# Patient Record
Sex: Female | Born: 1975 | Race: White | Hispanic: No | Marital: Single | Smoking: Current every day smoker
Health system: Southern US, Community
[De-identification: ages and names within clinical notes are randomized; demographics above are authoritative.]

## PROBLEM LIST (undated history)

## (undated) ENCOUNTER — Inpatient Hospital Stay (HOSPITAL_COMMUNITY): Payer: Self-pay

## (undated) ENCOUNTER — Emergency Department (HOSPITAL_COMMUNITY): Admission: EM | Payer: Medicaid Other | Source: Home / Self Care

## (undated) DIAGNOSIS — G8929 Other chronic pain: Secondary | ICD-10-CM

## (undated) DIAGNOSIS — F1010 Alcohol abuse, uncomplicated: Secondary | ICD-10-CM

## (undated) DIAGNOSIS — J45909 Unspecified asthma, uncomplicated: Secondary | ICD-10-CM

## (undated) DIAGNOSIS — K219 Gastro-esophageal reflux disease without esophagitis: Secondary | ICD-10-CM

## (undated) DIAGNOSIS — K089 Disorder of teeth and supporting structures, unspecified: Secondary | ICD-10-CM

## (undated) DIAGNOSIS — F32A Depression, unspecified: Secondary | ICD-10-CM

## (undated) DIAGNOSIS — N760 Acute vaginitis: Principal | ICD-10-CM

## (undated) DIAGNOSIS — R102 Pelvic and perineal pain unspecified side: Secondary | ICD-10-CM

## (undated) DIAGNOSIS — F410 Panic disorder [episodic paroxysmal anxiety] without agoraphobia: Secondary | ICD-10-CM

## (undated) DIAGNOSIS — A5901 Trichomonal vulvovaginitis: Secondary | ICD-10-CM

## (undated) DIAGNOSIS — F1910 Other psychoactive substance abuse, uncomplicated: Secondary | ICD-10-CM

## (undated) DIAGNOSIS — F329 Major depressive disorder, single episode, unspecified: Secondary | ICD-10-CM

## (undated) DIAGNOSIS — B9689 Other specified bacterial agents as the cause of diseases classified elsewhere: Secondary | ICD-10-CM

## (undated) HISTORY — DX: Major depressive disorder, single episode, unspecified: F32.9

## (undated) HISTORY — DX: Unspecified asthma, uncomplicated: J45.909

## (undated) HISTORY — DX: Other specified bacterial agents as the cause of diseases classified elsewhere: B96.89

## (undated) HISTORY — DX: Trichomonal vulvovaginitis: A59.01

## (undated) HISTORY — DX: Depression, unspecified: F32.A

## (undated) HISTORY — DX: Gastro-esophageal reflux disease without esophagitis: K21.9

## (undated) HISTORY — DX: Acute vaginitis: N76.0

## (undated) HISTORY — PX: ECTOPIC PREGNANCY SURGERY: SHX613

---

## 1997-12-09 ENCOUNTER — Other Ambulatory Visit: Admission: RE | Admit: 1997-12-09 | Discharge: 1997-12-09 | Payer: Self-pay | Admitting: Family Medicine

## 1998-02-04 ENCOUNTER — Other Ambulatory Visit: Admission: RE | Admit: 1998-02-04 | Discharge: 1998-02-04 | Payer: Self-pay | Admitting: Family Medicine

## 1999-01-12 ENCOUNTER — Encounter: Payer: Self-pay | Admitting: Obstetrics & Gynecology

## 1999-01-12 ENCOUNTER — Inpatient Hospital Stay (HOSPITAL_COMMUNITY): Admission: AD | Admit: 1999-01-12 | Discharge: 1999-01-12 | Payer: Self-pay | Admitting: Obstetrics & Gynecology

## 2000-12-05 ENCOUNTER — Inpatient Hospital Stay (HOSPITAL_COMMUNITY): Admission: AD | Admit: 2000-12-05 | Discharge: 2000-12-05 | Payer: Self-pay | Admitting: Obstetrics

## 2000-12-06 ENCOUNTER — Inpatient Hospital Stay (HOSPITAL_COMMUNITY): Admission: RE | Admit: 2000-12-06 | Discharge: 2000-12-06 | Payer: Self-pay | Admitting: Obstetrics

## 2000-12-06 ENCOUNTER — Encounter: Payer: Self-pay | Admitting: Obstetrics

## 2000-12-08 ENCOUNTER — Inpatient Hospital Stay (HOSPITAL_COMMUNITY): Admission: AD | Admit: 2000-12-08 | Discharge: 2000-12-08 | Payer: Self-pay | Admitting: Obstetrics & Gynecology

## 2005-02-02 ENCOUNTER — Emergency Department (HOSPITAL_COMMUNITY): Admission: EM | Admit: 2005-02-02 | Discharge: 2005-02-02 | Payer: Self-pay | Admitting: *Deleted

## 2005-10-20 ENCOUNTER — Ambulatory Visit (HOSPITAL_COMMUNITY): Admission: AD | Admit: 2005-10-20 | Discharge: 2005-10-20 | Payer: Self-pay | Admitting: Obstetrics and Gynecology

## 2005-11-07 ENCOUNTER — Inpatient Hospital Stay (HOSPITAL_COMMUNITY): Admission: AD | Admit: 2005-11-07 | Discharge: 2005-11-09 | Payer: Self-pay | Admitting: Obstetrics and Gynecology

## 2007-08-25 ENCOUNTER — Emergency Department (HOSPITAL_COMMUNITY): Admission: EM | Admit: 2007-08-25 | Discharge: 2007-08-25 | Payer: Self-pay | Admitting: Emergency Medicine

## 2008-01-06 ENCOUNTER — Emergency Department (HOSPITAL_COMMUNITY): Admission: EM | Admit: 2008-01-06 | Discharge: 2008-01-06 | Payer: Self-pay | Admitting: Emergency Medicine

## 2008-01-07 ENCOUNTER — Inpatient Hospital Stay (HOSPITAL_COMMUNITY): Admission: AD | Admit: 2008-01-07 | Discharge: 2008-01-08 | Payer: Self-pay | Admitting: Obstetrics and Gynecology

## 2008-01-07 ENCOUNTER — Encounter: Payer: Self-pay | Admitting: Emergency Medicine

## 2008-01-09 ENCOUNTER — Inpatient Hospital Stay (HOSPITAL_COMMUNITY): Admission: AD | Admit: 2008-01-09 | Discharge: 2008-01-09 | Payer: Self-pay | Admitting: Gynecology

## 2008-01-11 ENCOUNTER — Encounter (INDEPENDENT_AMBULATORY_CARE_PROVIDER_SITE_OTHER): Payer: Self-pay | Admitting: Gynecology

## 2008-01-11 ENCOUNTER — Ambulatory Visit (HOSPITAL_COMMUNITY): Admission: AD | Admit: 2008-01-11 | Discharge: 2008-01-11 | Payer: Self-pay | Admitting: Gynecology

## 2008-01-11 ENCOUNTER — Ambulatory Visit: Payer: Self-pay | Admitting: Gynecology

## 2008-03-12 ENCOUNTER — Emergency Department (HOSPITAL_COMMUNITY): Admission: EM | Admit: 2008-03-12 | Discharge: 2008-03-12 | Payer: Self-pay | Admitting: Emergency Medicine

## 2008-09-16 ENCOUNTER — Emergency Department (HOSPITAL_COMMUNITY): Admission: EM | Admit: 2008-09-16 | Discharge: 2008-09-16 | Payer: Self-pay | Admitting: Emergency Medicine

## 2009-05-06 ENCOUNTER — Encounter: Payer: Self-pay | Admitting: Orthopedic Surgery

## 2009-05-06 ENCOUNTER — Emergency Department (HOSPITAL_COMMUNITY): Admission: EM | Admit: 2009-05-06 | Discharge: 2009-05-07 | Payer: Self-pay | Admitting: Emergency Medicine

## 2009-05-27 ENCOUNTER — Encounter: Payer: Self-pay | Admitting: Orthopedic Surgery

## 2009-05-28 ENCOUNTER — Ambulatory Visit: Payer: Self-pay | Admitting: Orthopedic Surgery

## 2009-05-28 DIAGNOSIS — S93409A Sprain of unspecified ligament of unspecified ankle, initial encounter: Secondary | ICD-10-CM | POA: Insufficient documentation

## 2009-06-02 ENCOUNTER — Encounter: Payer: Self-pay | Admitting: Orthopedic Surgery

## 2009-06-02 ENCOUNTER — Emergency Department (HOSPITAL_COMMUNITY): Admission: EM | Admit: 2009-06-02 | Discharge: 2009-06-02 | Payer: Self-pay | Admitting: Emergency Medicine

## 2009-06-03 ENCOUNTER — Encounter (HOSPITAL_COMMUNITY): Admission: RE | Admit: 2009-06-03 | Discharge: 2009-07-03 | Payer: Self-pay | Admitting: Orthopedic Surgery

## 2009-06-05 ENCOUNTER — Ambulatory Visit: Payer: Self-pay | Admitting: Orthopedic Surgery

## 2009-06-05 DIAGNOSIS — S62639A Displaced fracture of distal phalanx of unspecified finger, initial encounter for closed fracture: Secondary | ICD-10-CM | POA: Insufficient documentation

## 2009-06-09 ENCOUNTER — Emergency Department (HOSPITAL_COMMUNITY): Admission: EM | Admit: 2009-06-09 | Discharge: 2009-06-09 | Payer: Self-pay | Admitting: Psychology

## 2009-06-30 ENCOUNTER — Encounter: Payer: Self-pay | Admitting: Orthopedic Surgery

## 2009-07-08 ENCOUNTER — Encounter: Payer: Self-pay | Admitting: Orthopedic Surgery

## 2009-07-21 ENCOUNTER — Encounter: Payer: Self-pay | Admitting: Orthopedic Surgery

## 2009-07-22 ENCOUNTER — Emergency Department (HOSPITAL_COMMUNITY): Admission: EM | Admit: 2009-07-22 | Discharge: 2009-07-22 | Payer: Self-pay | Admitting: Emergency Medicine

## 2009-11-19 ENCOUNTER — Emergency Department (HOSPITAL_COMMUNITY): Admission: EM | Admit: 2009-11-19 | Discharge: 2009-11-19 | Payer: Self-pay | Admitting: Emergency Medicine

## 2010-02-16 ENCOUNTER — Emergency Department (HOSPITAL_COMMUNITY): Admission: EM | Admit: 2010-02-16 | Discharge: 2010-02-16 | Payer: Self-pay | Admitting: Emergency Medicine

## 2010-09-07 ENCOUNTER — Emergency Department (HOSPITAL_COMMUNITY)
Admission: EM | Admit: 2010-09-07 | Discharge: 2010-09-07 | Payer: Self-pay | Source: Home / Self Care | Admitting: Emergency Medicine

## 2010-09-08 NOTE — Assessment & Plan Note (Signed)
Summary: [facility] FOL UP/FX RT THUMB/NEW PROB/XR [facility] 06/02/09/[place] MEDICAID.Marland KitchenMarland Kitchen   Visit Type:  new Referring Provider:  emergency room  CC:  pain RIGHT thumb.  History of Present Illness: I saw Susan [name] in the office today for a followup visit.  She is a 35 years old woman with the complaint of:  NEW PROBLEM. RIGHT THUMB.  ER ON 06-02-09.  chief complaint: RIGHT THUMB.  ER gave her Hydrocodone and Ibuprofen and she has taken all of that medication.   pain -duration   06-02-09. -location  RIGHT thumb -severity [1-10]   8 -timing, constant -worsened by:  Movement. -improved by:   Nothing. -quality: aching -context: Injury, hit the door. -other symptoms, swelling -xrays done & where: [facility] on 06-02-09. Transverse minimally displaced fracture through the distal  phalanx.  Page Allergies: No Known Drug Allergies  Physical Exam  Additional Exam:  normal the ligament and hygiene normal pulse and fusion in the thumb.  Normal sensation thumb.  Patient awake alert and oriented x3.  Lymph nodes not examined  Skin warm dry and intact  Gait and posture not assessed  Inspection shows tenderness and swelling of the thumb but no malalignment, interphalangeal joint range of motion is reduced because of pain and immobilization, most of his normal, IP joint is stable.   Impression & Recommendations: The x-rays were done at [facility]. The report and the films have been reviewed.  nd d phalanx fracture   Medications Added to Medication List This Visit: 1)  Vicodin 5-500 Mg Tabs (Hydrocodone-acetaminophen) .Marland Kitchen.. 1 q 4 as needed pain 2)  Ibuprofen 800 Mg Tabs (Ibuprofen) .Marland Kitchen.. 1 q 8 hrs  Other Orders: New Patient Level II (84696) Distal Phalanx Fx (29528)  Patient Instructions: 1)  Mon. Nov.,  15th xray of the right thumb  Prescriptions: IBUPROFEN 800 MG TABS (IBUPROFEN) 1 q 8 hrs  #90 x 1   Entered and Authorized by:   Fuller Canada MD   Signed by:   Fuller Canada MD on  06/05/2009   Method used:   Print then Give to Patient   RxID:   [id number] VICODIN 5-500 MG TABS (HYDROCODONE-ACETAMINOPHEN) 1 q 4 as needed pain  #54 x 1   Entered and Authorized by:   Fuller Canada MD   Signed by:   Fuller Canada MD on 06/05/2009   Method used:   Print then Give to Patient   RxID:   [id number]

## 2010-09-08 NOTE — Letter (Signed)
Summary: *Orthopedic No Show Letter  [name]  [name]  [place], [place] [address]   Phone: [phone]  Fax: [phone]    07/08/2009    Ms. Susan [name] [address] [place], [place] [address]    Dear [name],   Our records indicate that you missed your scheduled appointment with Dr. Beaulah Corin on 06/23/09.  Please contact this office to reschedule your appointment.  It is important that you keep your scheduled appointments with your physician, so we can provide you the best care possible.  We have enclosed an appointment card for your convenience.      Sincerely,    Dr. Terrance Mass, MD [facility] Phone [phone]

## 2010-09-08 NOTE — Miscellaneous (Signed)
Summary: Rehab Discharge  Rehab Discharge   Imported By: Jacklynn Ganong 07/08/2009 09:23:10  _____________________________________________________________________  External Attachment:    Type:   Image     Comment:   External Document

## 2010-11-10 LAB — D-DIMER, QUANTITATIVE (NOT AT ARMC): D-Dimer, Quant: 0.22 ug/mL-FEU (ref 0.00–0.48)

## 2010-11-10 LAB — BASIC METABOLIC PANEL
BUN: 7 mg/dL (ref 6–23)
CO2: 26 mEq/L (ref 19–32)
Calcium: 8.8 mg/dL (ref 8.4–10.5)
Chloride: 102 mEq/L (ref 96–112)
Creatinine, Ser: 0.79 mg/dL (ref 0.4–1.2)
GFR calc Af Amer: >60 mL/min (ref >60)
GFR calc non Af Amer: >60 mL/min (ref >60)
Glucose, Bld: 121 mg/dL — ABNORMAL HIGH (ref 70–99)
Potassium: 3.7 mEq/L (ref 3.5–5.1)
Sodium: 136 mEq/L (ref 135–145)

## 2010-11-11 LAB — URINALYSIS, ROUTINE W REFLEX MICROSCOPIC
Bilirubin Urine: NEGATIVE
Glucose, UA: NEGATIVE mg/dL
Ketones, ur: NEGATIVE mg/dL
Leukocytes, UA: NEGATIVE
Nitrite: NEGATIVE
Protein, ur: NEGATIVE mg/dL
Specific Gravity, Urine: >1.03 — ABNORMAL HIGH (ref 1.005–1.030)
Urobilinogen, UA: 0.2 mg/dL (ref 0.0–1.0)
pH: 5.5 (ref 5.0–8.0)

## 2010-11-11 LAB — URINE MICROSCOPIC-ADD ON

## 2010-11-11 LAB — GC/CHLAMYDIA PROBE AMP, GENITAL
Chlamydia, DNA Probe: NEGATIVE
GC Probe Amp, Genital: NEGATIVE

## 2010-11-11 LAB — PREGNANCY, URINE: Preg Test, Ur: NEGATIVE

## 2010-11-24 LAB — PREGNANCY, URINE: Preg Test, Ur: NEGATIVE

## 2010-12-22 NOTE — Op Note (Signed)
NAMEDEVYNN, HESSLER NO.:  [id number]   MEDICAL RECORD NO.:  [id number]          PATIENT TYPE:  AMB   LOCATION:  MATC                          FACILITY:  [facility]   PHYSICIAN:  Ginger Carne, MD  DATE OF BIRTH:  1976-02-08   DATE OF PROCEDURE:  01/11/2008  DATE OF DISCHARGE:                               OPERATIVE REPORT   PREOPERATIVE DIAGNOSIS:  Right tubal pregnancy.   POSTOPERATIVE DIAGNOSIS:  Right tubal pregnancy.   PROCEDURE:  Laparoscopic right salpingectomy and dilation and curettage.   SURGEON:  Ginger Carne, MD   ASSISTANT:  None.   COMPLICATIONS:  None immediate.   ESTIMATED BLOOD LOSS:  300 mL of old and clotted blood in the pelvis and  right gutter.   SPECIMEN:  Right tube to pathology.   ANESTHESIA:  General.   OPERATIVE FINDINGS:  External genitalia, vulva, and vagina normal.  Cervix smooth without erosions or lesions.  Laparoscopic evaluation  revealed about 200-300 mL of old and fresh clotted blood in the pelvis,  cul-de-sac, and right gutter extending up to the liver.  The right tube  was intact and significantly swollen consistent with a tubal pregnancy  and with the appearance of extrusion and active bleeding through the  fimbriated end consistent with a tubal pregnancy expulsion. Tissue in  the cul-de-sac appeared consistent with a tubal pregnancy that was  grayish and had a different consistency from old clotted blood.  The  right tube in addition to the extruded material in the cul-de-sac was  sent to pathology.  The uterus was normal in contour and size.  Left  tube and ovary appeared normal as did the right ovary.  The patient is  Rh positive.   OPERATIVE PROCEDURE:  The patient prepped and draped in usual fashion  and placed in the lithotomy position.  Betadine solution used for  antiseptic and the patient was catheterized prior to procedure.  After  adequate general anesthesia, tenaculum placed in the anterior lip of  the  cervix, dilatation to accommodate sharp curettage followed.  Specimen to  pathology.  The uterine cavity sounded to 7 cm.  Afterwards, Hickman  tenaculum placed in the endocervical canal.  A vertical infraumbilical  incision was made and a Veress needle placed in the abdomen.  Opening  closing pressures were 10-15 mmHg.  Needle released, trocar placed in  same incision.  Laparoscope placed in trocar sleeve.  Two 5-mm ports  were made in the left lower quadrant, left hypogastric regions under  direct visualization.  After identifying the right tubal pregnancy, the  right mesosalpinx was bipolar cauterized and cut to the isthmus at the  lateral junction of the uterus.  At this point, the mesosalpinx was cut  in addition to the cauterized portion of the tube adjacent to the right  lateral uterine wall.  Specimen removed with an Endopouch bag including  previous aforementioned cul-de-sac contents.  Irrigation followed with  irrigant removed.  No active bleeding noted  in the right mesosalpinx area.  Afterwards gas released, trocars  removed, closure of the 10-mm  fascia site with 0 Vicryl suture and 4-0  Vicryl for subcuticular closure.  The patient tolerated the procedure  well, returned to post anesthesia recovery room in excellent condition.      Ginger Carne, MD  Electronically Signed     SHB/MEDQ  D:  01/11/2008  T:  01/12/2008  Job:  [id number]

## 2010-12-25 NOTE — Op Note (Signed)
NAMEJALINE, PINCOCK NO.:  [id number]   MEDICAL RECORD NO.:  [id number]          PATIENT TYPE:  INP   LOCATION:  A401                          FACILITY:  [facility]   PHYSICIAN:  Langley Gauss, MD     DATE OF BIRTH:  [birth year]   DATE OF PROCEDURE:  11/07/2005  DATE OF DISCHARGE:                                 OPERATIVE REPORT   DELIVERY NOTE:   DELIVERY PERFORMED:  1.  Well controlled spontaneous assisted vaginal delivery of a viable      vigorous female infant.  2.  Repair of first-degree perineal laceration.   Delivery is complicated by findings of a nuchal cord x2 with compression  during this short second stage of labor, resulting in variable decelerations  down to 80-90 beats per minute.  Also noted at time of amniotomy is light  meconium-stained amniotic fluid.   SPECIMEN:  Arterial cord gas and cord blood sent to laboratory.  Placenta is  examined and appears to be intact with a three-vessel umbilical cord.   SUMMARY:  Patient admitted to having received one prenatal visit at [facility].  Initial examination 6 cm dilated.  Subsequently admitted and then  started on IV ampicillin due to unknown group B strep status.  With  amniotomy noted is very thin light meconium-stained amniotic fluid.  The  patient is noted be 8 cm dilated at that time.  Subsequently she progressed  very rapidly to complete dilatation.  She is placed a low lithotomy  position, prepped and draped in the usual sterile manner.  She pushed well  during a short second stage of labor to deliver in a direct OA position.  Mouth and nares were immediately DeLee suctioned connected to wall suction  on the perineum.  At that point in time the taut nuchal cord x2 is noted.  There was not enough laxity to allow it to be reduced over the infant's  vertex.  Thus the cord is doubly clamped and cut and nuchal cord x1 is then  transected.  Spontaneous rotation occurred to a right anterior  shoulder  position.  Gentle downward traction combined with expulsive efforts resulted  in delivery of this anterior shoulder as well as the remaining infant  without difficulty.  The infant is noted be vigorous with a spontaneous cry.  Thus the vocal cords are not visualized.  Arterial cord gas and cord blood  are then obtained from the umbilical cord.  Gentle traction on the umbilical  cord results in separation, which upon examination appears to be an intact  placenta with associated three-vessel umbilical cord.  Examination of the  genital tract reveals no lacerations.  There is a first-degree perineal  laceration, which is then sterilely prepped.  A total of 10  mL 1% lidocaine plain is placed.  A single figure-of-eight suture of 0  chromic is placed to reapproximate the perineal body.  The patient tolerated  the delivery very well.  She is allowed to bond with the infant subsequently  immediately following delivery.      Langley Gauss, MD  Electronically Signed     DC/MEDQ  D:  11/07/2005  T:  11/08/2005  Job:  [id number]

## 2010-12-25 NOTE — Discharge Summary (Signed)
NAMEJULIETTE, STANDRE NO.:  [id number]   MEDICAL RECORD NO.:  [id number]          PATIENT TYPE:  OIB   LOCATION:  LDR2                          FACILITY:  [facility]   PHYSICIAN:  Tilda Burrow, M.D. DATE OF BIRTH:  October 02, 1975   DATE OF ADMISSION:  10/20/2005  DATE OF DISCHARGE:  03/14/2007LH                                 DISCHARGE SUMMARY   OBSERVATION COURSE:  Aily is 35 years old and about [redacted] weeks pregnant.  She came in with complaints of contractions.  She has only had one prenatal  visit.  She was not having any contractions.  Her complaints of pain  subsided.  She was monitored for about 1-1/2 hours.  She was offered  to  stay and be observed for several hours, however, she declined saying that  she had family issues she had to attend to.  She supposedly had an  appointment at our office this morning at 9 a.m.  The patient was allowed to  go home.  Cervical exam was 2 cm, 50% effaced, -2 station which is the same  exam that she has had a previous visit to the hospital a few days prior.      Jacklyn Shell, C.N.M.      Tilda Burrow, M.D.  Electronically Signed    FC/MEDQ  D:  10/21/2005  T:  10/22/2005  Job:  [id number]   cc:   [facility]

## 2010-12-25 NOTE — H&P (Signed)
Susan, [name] NO.:  [id number]   MEDICAL RECORD NO.:  [id number]          PATIENT TYPE:  INP   LOCATION:  A401                          FACILITY:  [facility]   PHYSICIAN:  Langley Gauss, MD     DATE OF BIRTH:  August 31, 1975   DATE OF ADMISSION:  11/07/2005  DATE OF DISCHARGE:  LH                                HISTORY & PHYSICAL   Patient is a 35 year old gravida 3, para 2, two prior vaginal deliveries  with an EDC of April, 2007.  The patient presents to [facility] in  active labor with advanced dilatation at 6 cm.   PRENATAL CARE:  The patient has just recently completed a period of  incarceration, repaying a debt to society.  She was incarcerated in [place],  where prenatal services were provided through [facility].  Patient states  that she had no problems with prenatal care at [facility].  She was seen  x1 at the [facility], where her history was obtained as well as laboratory  studies.  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  This was obtained on October 15, 2005.  It is unclear whether  Valtrex suppression therapy was started at that time.  GC and Chlamydia  cultures were negative.  Urine drug screen was negative.  Hemoglobin 13.6,  hematocrit 32.9, white count 10.8.  Group B strep status is unknown, as the  patient had initial visit only at [facility], seen by what she described as  a Engineer, civil (consulting).   SOCIAL HISTORY:  One pack per day.  As stated previously, the patient has  been incarcerated.   OB HISTORY:  Two prior vaginal deliveries in 2000 and 2003.  In 2000  delivered in [facility], 7 pounds.  In 2003, delivered in [place], 7 pounds.   No other medical or surgical history.   No known drug allergies.   PHYSICAL EXAMINATION:  VITAL SIGNS:  Blood pressure 108/70, pulse rate 80,  respiratory rate 20.  GENERAL:  A white female, active laboring.  HEENT:  Negative.  No adenopathy.  NECK:  Supple.  Thyroid  is nonpalpable.  LUNGS:  Clear.  CARDIOVASCULAR:  Regular rate and rhythm.  ABDOMEN:  Soft and nontender.  Gravid uterus is identified.  Vertex  presentation by Leopold's maneuver.  Fundal height is 38 cm.  PELVIC:  External genitalia:  No lesions or ulcerations identified.  Vaginal  examination per the nursing staff, 6 cm dilated.  Bulging, intact membranes.  External fetal monitor reveals reassuring fetal heart rate.  Uterine  contractions occurring q.3-47min.  No fetal heart rate deceleration is noted.   She presents in active laboring with good prognosis for vaginal delivery,  unknown group B strep status.  Best to be treated with IV ampicillin during  the course of labor.  No history of having genital herpes activations during  this pregnancy.      Langley Gauss, MD  Electronically Signed     DC/MEDQ  D:  11/07/2005  T:  11/08/2005  Job:  [id number]

## 2011-04-18 ENCOUNTER — Emergency Department (HOSPITAL_COMMUNITY)
Admission: EM | Admit: 2011-04-18 | Discharge: 2011-04-18 | Disposition: A | Discharge status: Home / Self Care | Payer: Self-pay | Attending: Emergency Medicine | Admitting: Emergency Medicine

## 2011-04-18 DIAGNOSIS — N898 Other specified noninflammatory disorders of vagina: Secondary | ICD-10-CM | POA: Insufficient documentation

## 2011-04-18 DIAGNOSIS — B379 Candidiasis, unspecified: Secondary | ICD-10-CM | POA: Insufficient documentation

## 2011-04-18 DIAGNOSIS — B373 Candidiasis of vulva and vagina: Secondary | ICD-10-CM

## 2011-04-18 DIAGNOSIS — R109 Unspecified abdominal pain: Secondary | ICD-10-CM | POA: Insufficient documentation

## 2011-04-18 LAB — URINALYSIS, ROUTINE W REFLEX MICROSCOPIC
Bilirubin Urine: NEGATIVE
Glucose, UA: NEGATIVE mg/dL
Hgb urine dipstick: NEGATIVE
Ketones, ur: NEGATIVE mg/dL
Leukocytes, UA: NEGATIVE
Nitrite: NEGATIVE
Protein, ur: NEGATIVE mg/dL
Specific Gravity, Urine: >1.03 — ABNORMAL HIGH (ref 1.005–1.030)
Urobilinogen, UA: 0.2 mg/dL (ref 0.0–1.0)
pH: 6 (ref 5.0–8.0)

## 2011-04-18 LAB — WET PREP, GENITAL
Trich, Wet Prep: NONE SEEN
WBC, Wet Prep HPF POC: NONE SEEN
Yeast Wet Prep HPF POC: NONE SEEN

## 2011-04-18 LAB — POCT PREGNANCY, URINE: Preg Test, Ur: NEGATIVE

## 2011-04-18 MED ORDER — FLUCONAZOLE 100 MG PO TABS
150.0000 mg | ORAL_TABLET | Freq: Once | ORAL | Status: AC
  Administered 2011-04-18: 150 mg via ORAL
  Filled 2011-04-18: qty 2

## 2011-04-18 NOTE — ED Provider Notes (Signed)
History     CSN: [id number] Arrival date & time: 04/18/2011 10:32 AM  Chief Complaint  Patient presents with  . Abdominal Pain  . Vaginal Discharge   HPI Susan [name] is a 35 y.o. female that presents to the ED with vaginal discharge that started about 4 weeks ago. She was evaluated by her PCP and treated with Flagyl for bacterial vaginosis. Since that time she states the discharge has continued. She request "testing".   Past Medical History  Diagnosis Date  . Ectopic pregnancy     History reviewed. No pertinent past surgical history.  History reviewed. No pertinent family history.  History  Substance Use Topics  . Smoking status: Current Everyday Smoker -- 1.0 packs/day  . Smokeless tobacco: Not on file  . Alcohol Use: No    OB History    Grav Para Term Preterm Abortions TAB SAB Ect Mult Living                  Review of Systems  Constitutional: Negative for fever, chills, diaphoresis and fatigue.  HENT: Negative for ear pain, congestion, sore throat, facial swelling, neck pain, neck stiffness, dental problem and sinus pressure.   Eyes: Negative for photophobia, pain and discharge.  Respiratory: Negative for cough, chest tightness and wheezing.   Gastrointestinal: Positive for abdominal pain. Negative for nausea, vomiting, diarrhea, constipation and abdominal distention.  Genitourinary: Positive for frequency and vaginal discharge. Negative for dysuria, flank pain and difficulty urinating.  Musculoskeletal: Negative for myalgias, back pain and gait problem.  Skin: Negative for color change and rash.  Neurological: Negative for dizziness, speech difficulty, weakness, light-headedness, numbness and headaches.  Psychiatric/Behavioral: Negative for confusion and agitation.    Physical Exam  BP 112/63  Pulse 80  Temp(Src) 98 F (36.7 C) (Oral)  Resp 20  Ht 5\' 1"  (1.549 m)  Wt 155 lb (70.308 kg)  BMI 29.29 kg/m2  SpO2 100%  LMP 03/29/2011  Physical Exam  Nursing  note and vitals reviewed. Constitutional: She is oriented to person, place, and time. She appears well-developed and well-nourished. No distress.  HENT:  Head: Normocephalic and atraumatic.  Eyes: EOM are normal.  Neck: Neck supple.  Cardiovascular: Normal rate and regular rhythm.   Pulmonary/Chest: Effort normal.  Abdominal: Soft. Bowel sounds are normal.       Minimal tenderness with palpation lower abdomen.  Genitourinary:       Thick white cheesy discharge noted in vaginal vault. No CMT no adnexal tenderness. Uterus without palpable enlargement.  Musculoskeletal: Normal range of motion.  Neurological: She is alert and oriented to person, place, and time. No cranial nerve deficit.  Skin: Skin is warm and dry.    ED Course  Procedures  MDM Assessment: Vaginal Yeast infection  Plan: Diflucan 150 mg. Po now          Follow up with [facility], NP 04/18/11 1631

## 2011-04-18 NOTE — ED Notes (Signed)
Pt presents with low abdominal pain and yellow vaginal discharge. Pt denies urinary symptoms but states she has been seen by PMD and put on Flagyl x 2 with no relief.

## 2011-04-19 NOTE — ED Provider Notes (Signed)
Medical screening examination/treatment/procedure(s) were performed by non-physician practitioner and as supervising physician I was immediately available for consultation/collaboration.   Charles B. Bernette Mayers, MD 04/19/11 0700

## 2011-04-19 NOTE — ED Provider Notes (Signed)
Medical screening examination/treatment/procedure(s) were performed by non-physician practitioner and as supervising physician I was immediately available for consultation/collaboration.   Charles B. Bernette Mayers, MD 04/19/11 [phone]

## 2011-04-20 LAB — GC/CHLAMYDIA PROBE AMP, GENITAL
Chlamydia, DNA Probe: NEGATIVE
GC Probe Amp, Genital: NEGATIVE

## 2011-05-05 LAB — DIFFERENTIAL
Basophils Absolute: 0
Basophils Relative: 0
Eosinophils Absolute: 0.1
Eosinophils Relative: 1
Lymphocytes Relative: 30
Lymphs Abs: 1.7
Monocytes Absolute: 0.3
Monocytes Relative: 6
Neutro Abs: 3.5
Neutrophils Relative %: 63

## 2011-05-05 LAB — URINALYSIS, ROUTINE W REFLEX MICROSCOPIC
Bilirubin Urine: NEGATIVE
Glucose, UA: NEGATIVE
Ketones, ur: NEGATIVE
Nitrite: NEGATIVE
Protein, ur: NEGATIVE
Specific Gravity, Urine: 1.025
Urobilinogen, UA: 0.2
pH: 6

## 2011-05-05 LAB — URINE MICROSCOPIC-ADD ON

## 2011-05-05 LAB — CBC
HCT: 33.5 — ABNORMAL LOW
HCT: 33.8 — ABNORMAL LOW
Hemoglobin: 11.9 — ABNORMAL LOW
Hemoglobin: 12
MCHC: 35.3
MCHC: 35.9
MCV: 93.9
MCV: 94.9
Platelets: 199
Platelets: 205
RBC: 3.56 — ABNORMAL LOW
RBC: 3.56 — ABNORMAL LOW
RDW: 13.4
RDW: 13.8
WBC: 5.6
WBC: 8.1

## 2011-05-05 LAB — ABO/RH: ABO/RH(D): O POS

## 2011-05-05 LAB — BASIC METABOLIC PANEL
BUN: 8
CO2: 23
Calcium: 8.6
Chloride: 108
Creatinine, Ser: 0.9
GFR calc Af Amer: >60
GFR calc non Af Amer: >60
Glucose, Bld: 120 — ABNORMAL HIGH
Potassium: 4.1
Sodium: 137

## 2011-05-05 LAB — WET PREP, GENITAL
Trich, Wet Prep: NONE SEEN
Yeast Wet Prep HPF POC: NONE SEEN

## 2011-05-05 LAB — GC/CHLAMYDIA PROBE AMP, GENITAL
Chlamydia, DNA Probe: NEGATIVE
GC Probe Amp, Genital: NEGATIVE

## 2011-05-05 LAB — HCG, QUANTITATIVE, PREGNANCY
hCG, Beta Chain, Quant, S: 678 — ABNORMAL HIGH
hCG, Beta Chain, Quant, S: 703 — ABNORMAL HIGH

## 2011-05-05 LAB — PREGNANCY, URINE: Preg Test, Ur: POSITIVE

## 2011-05-06 LAB — CBC
HCT: 33 — ABNORMAL LOW
HCT: 34.5 — ABNORMAL LOW
HCT: 35 — ABNORMAL LOW
Hemoglobin: 11.4 — ABNORMAL LOW
Hemoglobin: 12.3
Hemoglobin: 12.4
MCHC: 34.6
MCHC: 35.2
MCHC: 35.9
MCV: 94.7
MCV: 95.7
MCV: 96.8
Platelets: 212
Platelets: 222
Platelets: 236
RBC: 3.41 — ABNORMAL LOW
RBC: 3.65 — ABNORMAL LOW
RBC: 3.65 — ABNORMAL LOW
RDW: 13.2
RDW: 13.2
RDW: 13.5
WBC: 12.2 — ABNORMAL HIGH
WBC: 7.5
WBC: 8.4

## 2011-05-06 LAB — DIFFERENTIAL
Basophils Absolute: 0
Basophils Absolute: 0
Basophils Relative: 0
Basophils Relative: 0
Eosinophils Absolute: 0.1
Eosinophils Absolute: 0.1
Eosinophils Relative: 1
Eosinophils Relative: 1
Lymphocytes Relative: 13
Lymphocytes Relative: 33
Lymphs Abs: 1.5
Lymphs Abs: 2.8
Monocytes Absolute: 0.3
Monocytes Absolute: 0.5
Monocytes Relative: 3
Monocytes Relative: 6
Neutro Abs: 10.2 — ABNORMAL HIGH
Neutro Abs: 5.1
Neutrophils Relative %: 60
Neutrophils Relative %: 84 — ABNORMAL HIGH

## 2011-05-06 LAB — COMPREHENSIVE METABOLIC PANEL
ALT: 17
AST: 22
Albumin: 3.5
Alkaline Phosphatase: 55
BUN: 7
CO2: 25
Calcium: 8.7
Chloride: 102
Creatinine, Ser: 0.57
GFR calc Af Amer: >60
GFR calc non Af Amer: >60
Glucose, Bld: 99
Potassium: 3.8
Sodium: 133 — ABNORMAL LOW
Total Bilirubin: 0.4
Total Protein: 6.3

## 2011-05-06 LAB — BASIC METABOLIC PANEL
BUN: 8
CO2: 25
Calcium: 9.1
Chloride: 103
Creatinine, Ser: 0.42
GFR calc Af Amer: >60
GFR calc non Af Amer: >60
Glucose, Bld: 138 — ABNORMAL HIGH
Potassium: 3.9
Sodium: 135

## 2011-05-06 LAB — HCG, QUANTITATIVE, PREGNANCY
hCG, Beta Chain, Quant, S: 1581 — ABNORMAL HIGH
hCG, Beta Chain, Quant, S: 529 — ABNORMAL HIGH

## 2011-08-30 ENCOUNTER — Emergency Department (HOSPITAL_COMMUNITY)
Admission: EM | Admit: 2011-08-30 | Discharge: 2011-08-30 | Disposition: A | Discharge status: Home / Self Care | Payer: Medicaid Other | Attending: Emergency Medicine | Admitting: Emergency Medicine

## 2011-08-30 ENCOUNTER — Encounter (HOSPITAL_COMMUNITY): Payer: Self-pay

## 2011-08-30 DIAGNOSIS — F17200 Nicotine dependence, unspecified, uncomplicated: Secondary | ICD-10-CM | POA: Insufficient documentation

## 2011-08-30 DIAGNOSIS — L089 Local infection of the skin and subcutaneous tissue, unspecified: Secondary | ICD-10-CM | POA: Insufficient documentation

## 2011-08-30 MED ORDER — AMOXICILLIN 500 MG PO CAPS: ORAL_CAPSULE | ORAL | Status: DC

## 2011-08-30 NOTE — ED Notes (Signed)
Pt reports red raised area to back of neck x 4 days.  Says thinks was bitten by a spider.  C/O burning feeling to area.

## 2011-08-30 NOTE — ED Notes (Signed)
Pt presents with a raised red/inflammed area to back of neck. Pt reports burning and itching.

## 2011-08-30 NOTE — ED Provider Notes (Signed)
History     CSN: [id number]  Arrival date & time 08/30/11  1051   First MD Initiated Contact with Patient 08/30/11 1135      Chief Complaint  Patient presents with  . Insect Bite    (Consider location/radiation/quality/duration/timing/severity/associated sxs/prior treatment) HPI Comments: Patient reports that approximately 3-3-4 days ago she had and noticed a red raised area on the back of her neck. She is unsure as to whether she may have sustained an insect bite, or a scratch, or some other breaking of the skin. She denies any change in the use of soaps. She denies change in clothing. She denies any new juried. She presents for assessment of this problem.  The history is provided by the patient.    Past Medical History  Diagnosis Date  . Ectopic pregnancy     Past Surgical History  Procedure Date  . Ectopic pregnancy surgery     No family history on file.  History  Substance Use Topics  . Smoking status: Current Everyday Smoker -- 1.0 packs/day  . Smokeless tobacco: Not on file  . Alcohol Use: No    OB History    Grav Para Term Preterm Abortions TAB SAB Ect Mult Living                  Review of Systems  Constitutional: Negative for activity change.       All ROS Neg except as noted in HPI  HENT: Negative for nosebleeds and neck pain.   Eyes: Negative for photophobia and discharge.  Respiratory: Negative for cough, shortness of breath and wheezing.   Cardiovascular: Negative for chest pain and palpitations.  Gastrointestinal: Negative for abdominal pain and blood in stool.  Genitourinary: Negative for dysuria, frequency and hematuria.  Musculoskeletal: Negative for back pain and arthralgias.  Skin: Negative.   Neurological: Negative for dizziness, seizures and speech difficulty.  Psychiatric/Behavioral: Negative for hallucinations and confusion.    Allergies  Review of patient's allergies indicates no known allergies.  Home Medications  No current  outpatient prescriptions on file.  BP 105/62  Pulse 72  Temp(Src) 98.8 F (37.1 C) (Oral)  Resp 18  Ht 5\' 1"  (1.549 m)  Wt 160 lb (72.576 kg)  BMI 30.23 kg/m2  SpO2 98%  LMP 08/09/2011  Physical Exam  Nursing note and vitals reviewed. Constitutional: She is oriented to person, place, and time. She appears well-developed and well-nourished.  Non-toxic appearance.  HENT:  Head: Normocephalic.  Right Ear: Tympanic membrane and external ear normal.  Left Ear: Tympanic membrane and external ear normal.  Eyes: EOM and lids are normal. Pupils are equal, round, and reactive to light.  Neck: Normal range of motion. Neck supple. Carotid bruit is not present.       There is a small area pustule surrounded by some increased redness present. The back of the neck. There is no red streaking. There is no change in the range of motion of the neck. And there is no swollen lymph nodes appreciated.  Cardiovascular: Normal rate, regular rhythm, normal heart sounds, intact distal pulses and normal pulses.   Pulmonary/Chest: Breath sounds normal. No respiratory distress.  Abdominal: Soft. Bowel sounds are normal. There is no tenderness. There is no guarding.  Musculoskeletal: Normal range of motion.  Lymphadenopathy:       Head (right side): No submandibular adenopathy present.       Head (left side): No submandibular adenopathy present.    She has no cervical adenopathy.  Neurological: She is alert and oriented to person, place, and time. She has normal strength. No cranial nerve deficit or sensory deficit.  Skin: Skin is warm and dry.  Psychiatric: She has a normal mood and affect. Her speech is normal.    ED Course  Procedures (including critical care time)  Labs Reviewed - No data to display No results found.   Dx: Skin infection   MDM  I have reviewed nursing notes, vital signs, and all appropriate lab and imaging results for this patient. Patient advised to apply Neosporin 2 times  daily to the neck. Prescription given for amoxicillin 2 times daily.       Kathie Dike, [place] 08/30/11 1144

## 2011-08-31 NOTE — ED Provider Notes (Signed)
Medical screening examination/treatment/procedure(s) were performed by non-physician practitioner and as supervising physician I was immediately available for consultation/collaboration.  Jeyson Deshotel, MD 08/31/11 0709 

## 2011-11-06 ENCOUNTER — Emergency Department (HOSPITAL_COMMUNITY)
Admission: EM | Admit: 2011-11-06 | Discharge: 2011-11-06 | Disposition: A | Discharge status: Home / Self Care | Payer: Medicaid Other | Attending: Emergency Medicine | Admitting: Emergency Medicine

## 2011-11-06 ENCOUNTER — Encounter (HOSPITAL_COMMUNITY): Payer: Self-pay | Admitting: Emergency Medicine

## 2011-11-06 DIAGNOSIS — F17200 Nicotine dependence, unspecified, uncomplicated: Secondary | ICD-10-CM | POA: Insufficient documentation

## 2011-11-06 DIAGNOSIS — J039 Acute tonsillitis, unspecified: Secondary | ICD-10-CM | POA: Insufficient documentation

## 2011-11-06 LAB — RAPID STREP SCREEN (MED CTR MEBANE ONLY): Streptococcus, Group A Screen (Direct): NEGATIVE

## 2011-11-06 MED ORDER — LIDOCAINE VISCOUS 2 % MT SOLN: 10.0000 mL | OROMUCOSAL | Status: AC | PRN

## 2011-11-06 MED ORDER — IBUPROFEN 100 MG/5ML PO SUSP
ORAL | Status: AC
  Filled 2011-11-06: qty 30

## 2011-11-06 MED ORDER — IBUPROFEN 400 MG PO TABS: 600.0000 mg | ORAL_TABLET | Freq: Once | ORAL | Status: DC

## 2011-11-06 MED ORDER — PENICILLIN G BENZATHINE 1200000 UNIT/2ML IM SUSP
1.2000 10*6.[IU] | Freq: Once | INTRAMUSCULAR | Status: AC
  Administered 2011-11-06: 1.2 10*6.[IU] via INTRAMUSCULAR
  Filled 2011-11-06: qty 2

## 2011-11-06 MED ORDER — IBUPROFEN 100 MG/5ML PO SUSP
600.0000 mg | Freq: Once | ORAL | Status: AC
  Administered 2011-11-06: 600 mg via ORAL

## 2011-11-06 MED ORDER — IBUPROFEN 600 MG PO TABS: 600.0000 mg | ORAL_TABLET | Freq: Four times a day (QID) | ORAL | Status: AC | PRN

## 2011-11-06 NOTE — Discharge Instructions (Signed)
Tonsillitis Tonsils are lumps of lymphoid tissues at the back of the throat. Each tonsil has 20 crevices (crypts). Tonsils help fight nose and throat infections and keep infection from spreading to other parts of the body for the first 18 months of life. Tonsillitis is an infection of the throat that causes the tonsils to become red, tender, and swollen. CAUSES Sudden and, if treated, temporary (acute) tonsillitis is usually caused by infection with streptococcal bacteria. Long lasting (chronic) tonsillitis occurs when the crypts of the tonsils become filled with pieces of food and bacteria, which makes it easy for the tonsils to become constantly infected. SYMPTOMS  Symptoms of tonsillitis include:  A sore throat.   White patches on the tonsils.   Fever.   Tiredness.  DIAGNOSIS Tonsillitis can be diagnosed through a physical exam. Diagnosis can be confirmed with the results of lab tests, including a throat culture. TREATMENT  The goals of tonsillitis treatment include the reduction of the severity and duration of symptoms, prevention of associated conditions, and prevention of disease transmission. Tonsillitis caused by bacteria can be treated with antibiotics. Usually, treatment with antibiotics is started before the cause of the tonsillitis is known. However, if it is determined that the cause is not bacterial, antibiotics will not treat the tonsillitis. If attacks of tonsillitis are severe and frequent, your caregiver may recommend surgery to remove the tonsils (tonsillectomy). HOME CARE INSTRUCTIONS   Rest as much as possible and get plenty of sleep.   Drink plenty of fluids. While the throat is very sore, eat soft foods or liquids, such as sherbet, soups, or instant breakfast drinks.   Eat frozen ice pops.   Older children and adults may gargle with a warm or cold liquid to help soothe the throat. Mix 1 teaspoon of salt in 1 cup of water.   Other family members who also develop a  sore throat or fever should have a medical exam or throat culture.   Only take over-the-counter or prescription medicines for pain, discomfort, or fever as directed by your caregiver.   If you are given antibiotics, take them as directed. Finish them even if you start to feel better.  SEEK MEDICAL CARE IF:   Your baby is older than 3 months with a rectal temperature of 100.5 F (38.1 C) or higher for more than 1 day.   Large, tender lumps develop in your neck.   A rash develops.   Green, yellow-brown, or bloody substance is coughed up.   You are unable to swallow liquids or food for 24 hours.   Your child is unable to swallow food or liquids for 12 hours.  SEEK IMMEDIATE MEDICAL CARE IF:   You develop any new symptoms such as vomiting, severe headache, stiff neck, chest pain, or trouble breathing or swallowing.   You have severe throat pain along with drooling or voice changes.   You have severe pain, unrelieved with recommended medications.   You are unable to fully open the mouth.   You develop redness, swelling, or severe pain anywhere in the neck.   You have a fever.   Your baby is older than 3 months with a rectal temperature of 102 F (38.9 C) or higher.   Your baby is 12 months old or younger with a rectal temperature of 100.4 F (38 C) or higher.  MAKE SURE YOU:   Understand these instructions.   Will watch your condition.   Will get help right away if you are not  watch your condition.   Will get help right away if you are not doing well or get worse.  Document Released: 05/05/2005 Document Revised: 07/15/2011 Document Reviewed: 10/01/2010  [employer] Patient Information 2012 [employer].

## 2011-11-06 NOTE — ED Provider Notes (Signed)
History     CSN: [id number]  Arrival date & time 11/06/11  1531   First MD Initiated Contact with Patient 11/06/11 1553      Chief Complaint  Patient presents with  . Sore Throat    (Consider location/radiation/quality/duration/timing/severity/associated sxs/prior treatment) Patient is a 36 y.o. female presenting with pharyngitis. The history is provided by the patient.  Sore Throat This is a new problem. The current episode started in the past 7 days. The problem occurs constantly. The problem has been unchanged. Associated symptoms include anorexia, chills, a fever and myalgias. Pertinent negatives include no abdominal pain, arthralgias, chest pain, congestion, headaches, joint swelling, nausea, neck pain, numbness, rash, sore throat, vomiting or weakness. The symptoms are aggravated by swallowing. She has tried nothing for the symptoms.    Past Medical History  Diagnosis Date  . Ectopic pregnancy     Past Surgical History  Procedure Date  . Ectopic pregnancy surgery     Family History  Problem Relation Age of Onset  . COPD Mother     History  Substance Use Topics  . Smoking status: Current Everyday Smoker -- 1.0 packs/day  . Smokeless tobacco: Not on file  . Alcohol Use: No    OB History    Grav Para Term Preterm Abortions TAB SAB Ect Mult Living   5 3 3  2   2         Review of Systems  Constitutional: Positive for fever and chills.  HENT: Negative for congestion, sore throat and neck pain.   Eyes: Negative.   Respiratory: Negative for chest tightness and shortness of breath.   Cardiovascular: Negative for chest pain.  Gastrointestinal: Positive for anorexia. Negative for nausea, vomiting and abdominal pain.  Genitourinary: Negative.   Musculoskeletal: Positive for myalgias. Negative for joint swelling and arthralgias.  Skin: Negative.  Negative for rash and wound.  Neurological: Negative for dizziness, weakness, light-headedness, numbness and headaches.    Hematological: Negative.   Psychiatric/Behavioral: Negative.     Allergies  Review of patient's allergies indicates no known allergies.  Home Medications   Current Outpatient Rx  Name Route Sig Dispense Refill  . ALBUTEROL SULFATE HFA 108 (90 BASE) MCG/ACT IN AERS Inhalation Inhale 2 puffs into the lungs every 6 (six) hours as needed.    . ALPRAZOLAM 1 MG PO TABS Oral Take 1 mg by mouth at bedtime as needed.    . AMOXICILLIN 500 MG PO CAPS  2 po bid with food 20 capsule 0  . DULOXETINE HCL 60 MG PO CPEP Oral Take 60 mg by mouth daily.    . IBUPROFEN 600 MG PO TABS Oral Take 1 tablet (600 mg total) by mouth every 6 (six) hours as needed for pain. 30 tablet 0  . LIDOCAINE VISCOUS 2 % MT SOLN Oral Take 10 mLs by mouth as needed for pain (Gargle and spit with 10 ml every 4 hours if needed for pain). 100 mL 0    BP 113/71  Pulse 100  Temp(Src) 100.8 F (38.2 C) (Oral)  Resp 14  Ht 5\' 1"  (1.549 m)  Wt 160 lb (72.576 kg)  BMI 30.23 kg/m2  SpO2 100%  LMP 11/04/2011  Physical Exam  Nursing note and vitals reviewed. Constitutional: She is oriented to person, place, and time. She appears well-developed and well-nourished.  HENT:  Head: Normocephalic and atraumatic.  Right Ear: External ear normal.  Left Ear: External ear normal.  Mouth/Throat: Mucous membranes are normal. Oropharyngeal exudate and posterior  oropharyngeal erythema present. No posterior oropharyngeal edema or tonsillar abscesses.  Eyes: Conjunctivae are normal.  Neck: Normal range of motion.  Cardiovascular: Normal rate, regular rhythm, normal heart sounds and intact distal pulses.   Pulmonary/Chest: Effort normal and breath sounds normal. She has no wheezes.  Abdominal: Soft. Bowel sounds are normal. There is no tenderness.  Musculoskeletal: Normal range of motion.  Lymphadenopathy:       Head (right side): Tonsillar adenopathy present.       Head (left side): Tonsillar adenopathy present.  Neurological: She is  alert and oriented to person, place, and time.  Skin: Skin is warm and dry.  Psychiatric: She has a normal mood and affect.    ED Course  Procedures (including critical care time)   Labs Reviewed  RAPID STREP SCREEN   No results found.   1. Tonsillitis with exudate      Acute tonsillitis - pt unable to tolerate po meds - bicillin LA given IM.  MDM  No peritonsillar abscess.  Strep culture not sent since pt is being covered with bicillin.          Candis Musa, PA 11/06/11 [phone]

## 2011-11-06 NOTE — ED Notes (Signed)
Pt c/o sore throat since Monday. States she has been unable to eat for the past few days.

## 2011-11-06 NOTE — ED Provider Notes (Signed)
Medical screening examination/treatment/procedure(s) were performed by non-physician practitioner and as supervising physician I was immediately available for consultation/collaboration.  Assunta Pupo, MD 11/06/11 1908 

## 2012-08-10 ENCOUNTER — Encounter (HOSPITAL_COMMUNITY): Payer: Self-pay | Admitting: *Deleted

## 2012-08-10 ENCOUNTER — Emergency Department (HOSPITAL_COMMUNITY)
Admission: EM | Admit: 2012-08-10 | Discharge: 2012-08-10 | Disposition: A | Discharge status: Home / Self Care | Payer: Medicaid Other | Attending: Emergency Medicine | Admitting: Emergency Medicine

## 2012-08-10 DIAGNOSIS — J029 Acute pharyngitis, unspecified: Secondary | ICD-10-CM | POA: Insufficient documentation

## 2012-08-10 DIAGNOSIS — F17200 Nicotine dependence, unspecified, uncomplicated: Secondary | ICD-10-CM | POA: Insufficient documentation

## 2012-08-10 DIAGNOSIS — K089 Disorder of teeth and supporting structures, unspecified: Secondary | ICD-10-CM | POA: Insufficient documentation

## 2012-08-10 DIAGNOSIS — J3489 Other specified disorders of nose and nasal sinuses: Secondary | ICD-10-CM | POA: Insufficient documentation

## 2012-08-10 DIAGNOSIS — R6883 Chills (without fever): Secondary | ICD-10-CM | POA: Insufficient documentation

## 2012-08-10 DIAGNOSIS — Z79899 Other long term (current) drug therapy: Secondary | ICD-10-CM | POA: Insufficient documentation

## 2012-08-10 DIAGNOSIS — R05 Cough: Secondary | ICD-10-CM

## 2012-08-10 DIAGNOSIS — R059 Cough, unspecified: Secondary | ICD-10-CM

## 2012-08-10 DIAGNOSIS — K0889 Other specified disorders of teeth and supporting structures: Secondary | ICD-10-CM

## 2012-08-10 MED ORDER — BENZONATATE 200 MG PO CAPS: 200.0000 mg | ORAL_CAPSULE | Freq: Three times a day (TID) | ORAL | Status: DC | PRN

## 2012-08-10 MED ORDER — AZITHROMYCIN 250 MG PO TABS: ORAL_TABLET | ORAL | Status: DC

## 2012-08-10 MED ORDER — HYDROCODONE-ACETAMINOPHEN 5-325 MG PO TABS
ORAL_TABLET | ORAL | Status: DC
Start: 2012-08-10 — End: 2012-10-17

## 2012-08-10 NOTE — ED Notes (Signed)
Cough, sore throat, and dental pain

## 2012-08-10 NOTE — ED Provider Notes (Signed)
History     CSN: [id number]  Arrival date & time 08/10/12  1227   First MD Initiated Contact with Patient 08/10/12 1248      Chief Complaint  Patient presents with  . Cough    (Consider location/radiation/quality/duration/timing/severity/associated sxs/prior treatment) Patient is a 37 y.o. female presenting with cough and tooth pain. The history is provided by the patient.  Cough This is a new problem. The current episode started more than 2 days ago. The problem occurs every few minutes. The problem has not changed since onset.The cough is productive of sputum. There has been no fever. Associated symptoms include chills and sore throat. Pertinent negatives include no chest pain, no sweats, no ear pain, no headaches, no rhinorrhea, no myalgias, no shortness of breath and no wheezing. She has tried nothing for the symptoms. The treatment provided no relief. She is a smoker. Her past medical history does not include pneumonia or asthma.  Dental PainThe primary symptoms include mouth pain, sore throat and cough. Primary symptoms do not include dental injury, oral bleeding, headaches, fever, shortness of breath or angioedema. The symptoms began 2 days ago. The symptoms are worsening. The symptoms are new. The symptoms occur constantly.  Affected locations include: teeth.  The sore throat is not accompanied by trouble swallowing.  Additional symptoms include: dental sensitivity to temperature. Additional symptoms do not include: gum swelling, purulent gums, trismus, facial swelling, trouble swallowing, pain with swallowing, ear pain and swollen glands. Medical issues include: smoking and periodontal disease.    Past Medical History  Diagnosis Date  . Ectopic pregnancy     Past Surgical History  Procedure Date  . Ectopic pregnancy surgery     Family History  Problem Relation Age of Onset  . COPD Mother     History  Substance Use Topics  . Smoking status: Current Every Day Smoker --  1.0 packs/day  . Smokeless tobacco: Not on file  . Alcohol Use: No    OB History    Grav Para Term Preterm Abortions TAB SAB Ect Mult Living   5 3 3  2   2         Review of Systems  Constitutional: Positive for chills. Negative for fever, activity change and appetite change.  HENT: Positive for congestion, sore throat and dental problem. Negative for ear pain, facial swelling, rhinorrhea, trouble swallowing, neck pain and neck stiffness.   Eyes: Negative for pain and visual disturbance.  Respiratory: Positive for cough. Negative for chest tightness, shortness of breath and wheezing.   Cardiovascular: Negative for chest pain.  Gastrointestinal: Negative for nausea and vomiting.  Genitourinary: Negative for dysuria.  Musculoskeletal: Negative for myalgias.  Neurological: Negative for dizziness, facial asymmetry and headaches.  Hematological: Negative for adenopathy.  All other systems reviewed and are negative.    Allergies  Review of patient's allergies indicates no known allergies.  Home Medications   Current Outpatient Rx  Name  Route  Sig  Dispense  Refill  . ALPRAZOLAM 1 MG PO TABS   Oral   Take 1 mg by mouth at bedtime as needed. anxiety         . DULOXETINE HCL 60 MG PO CPEP   Oral   Take 60 mg by mouth daily.           BP 116/60  Pulse 83  Temp 97.9 F (36.6 C) (Oral)  Resp 20  Ht 5\' 1"  (1.549 m)  Wt 160 lb (72.576 kg)  BMI 30.23 kg/m2  SpO2 99%  LMP 08/06/2012  Physical Exam  Nursing note and vitals reviewed. Constitutional: She is oriented to person, place, and time. She appears well-developed and well-nourished. No distress.  HENT:  Head: Normocephalic and atraumatic. No trismus in the jaw.  Right Ear: Tympanic membrane and ear canal normal.  Left Ear: Tympanic membrane and ear canal normal.  Mouth/Throat: Uvula is midline, oropharynx is clear and moist and mucous membranes are normal. Dental caries present. No dental abscesses or uvula  swelling.  Neck: Normal range of motion. Neck supple.  Cardiovascular: Normal rate, regular rhythm and normal heart sounds.   No murmur heard. Pulmonary/Chest: Effort normal. No respiratory distress. She has no wheezes. She has no rales. She exhibits no tenderness.       Lung sounds are coarse bilaterally w/o rales or wheezes  Abdominal: Soft. She exhibits no distension. There is no tenderness.  Musculoskeletal: Normal range of motion.  Lymphadenopathy:    She has no cervical adenopathy.  Neurological: She is alert and oriented to person, place, and time. She exhibits normal muscle tone. Coordination normal.  Skin: Skin is warm and dry.    ED Course  Procedures (including critical care time)  Labs Reviewed - No data to display No results found.      MDM    Vitals stable NAD.  No tachycardia, tachypnea or hypoxia.  Active cough with nasal congestion and coarse lung sounds.  ttp of the left upper lateral incisor.  No facial edema, dental abscess or trismus.  Pt agrees to close f/u with her PMD and her dentist.   Prescribed: Tessalon perles zithromax norco #15      Joaquin Knebel L. [place], [place] 08/12/12 2054

## 2012-08-14 NOTE — ED Provider Notes (Signed)
Medical screening examination/treatment/procedure(s) were performed by non-physician practitioner and as supervising physician I was immediately available for consultation/collaboration.  Raeford Razor, MD 08/14/12 1257

## 2012-10-17 ENCOUNTER — Encounter (HOSPITAL_COMMUNITY): Payer: Self-pay | Admitting: *Deleted

## 2012-10-17 ENCOUNTER — Emergency Department (HOSPITAL_COMMUNITY)
Admission: EM | Admit: 2012-10-17 | Discharge: 2012-10-17 | Disposition: A | Discharge status: Home / Self Care | Payer: Medicaid Other | Attending: Emergency Medicine | Admitting: Emergency Medicine

## 2012-10-17 ENCOUNTER — Emergency Department (HOSPITAL_COMMUNITY): Payer: Medicaid Other

## 2012-10-17 DIAGNOSIS — R112 Nausea with vomiting, unspecified: Secondary | ICD-10-CM | POA: Insufficient documentation

## 2012-10-17 DIAGNOSIS — R197 Diarrhea, unspecified: Secondary | ICD-10-CM

## 2012-10-17 DIAGNOSIS — F17200 Nicotine dependence, unspecified, uncomplicated: Secondary | ICD-10-CM | POA: Insufficient documentation

## 2012-10-17 DIAGNOSIS — F1010 Alcohol abuse, uncomplicated: Secondary | ICD-10-CM | POA: Insufficient documentation

## 2012-10-17 DIAGNOSIS — R350 Frequency of micturition: Secondary | ICD-10-CM | POA: Insufficient documentation

## 2012-10-17 DIAGNOSIS — N390 Urinary tract infection, site not specified: Secondary | ICD-10-CM | POA: Insufficient documentation

## 2012-10-17 DIAGNOSIS — Z3202 Encounter for pregnancy test, result negative: Secondary | ICD-10-CM | POA: Insufficient documentation

## 2012-10-17 DIAGNOSIS — Z79899 Other long term (current) drug therapy: Secondary | ICD-10-CM | POA: Insufficient documentation

## 2012-10-17 LAB — CBC WITH DIFFERENTIAL/PLATELET
Basophils Absolute: 0 10*3/uL (ref 0.0–0.1)
Basophils Relative: 0 % (ref 0–1)
Eosinophils Absolute: 0.2 10*3/uL (ref 0.0–0.7)
Eosinophils Relative: 1 % (ref 0–5)
HCT: 37.5 % (ref 36.0–46.0)
Hemoglobin: 12.7 g/dL (ref 12.0–15.0)
Lymphocytes Relative: 30 % (ref 12–46)
Lymphs Abs: 3.2 10*3/uL (ref 0.7–4.0)
MCH: 32.8 pg (ref 26.0–34.0)
MCHC: 33.9 g/dL (ref 30.0–36.0)
MCV: 96.9 fL (ref 78.0–100.0)
Monocytes Absolute: 0.7 10*3/uL (ref 0.1–1.0)
Monocytes Relative: 6 % (ref 3–12)
Neutro Abs: 6.9 10*3/uL (ref 1.7–7.7)
Neutrophils Relative %: 63 % (ref 43–77)
Platelets: 272 10*3/uL (ref 150–400)
RBC: 3.87 MIL/uL (ref 3.87–5.11)
RDW: 12.7 % (ref 11.5–15.5)
WBC: 10.9 10*3/uL — ABNORMAL HIGH (ref 4.0–10.5)

## 2012-10-17 LAB — URINALYSIS, ROUTINE W REFLEX MICROSCOPIC
Bilirubin Urine: NEGATIVE
Glucose, UA: NEGATIVE mg/dL
Ketones, ur: NEGATIVE mg/dL
Nitrite: NEGATIVE
Protein, ur: NEGATIVE mg/dL
Specific Gravity, Urine: 1.02 (ref 1.005–1.030)
Urobilinogen, UA: 0.2 mg/dL (ref 0.0–1.0)
pH: 6.5 (ref 5.0–8.0)

## 2012-10-17 LAB — COMPREHENSIVE METABOLIC PANEL
ALT: 12 U/L (ref 0–35)
AST: 14 U/L (ref 0–37)
Albumin: 3.5 g/dL (ref 3.5–5.2)
Alkaline Phosphatase: 77 U/L (ref 39–117)
BUN: 21 mg/dL (ref 6–23)
CO2: 25 mEq/L (ref 19–32)
Calcium: 8.8 mg/dL (ref 8.4–10.5)
Chloride: 103 mEq/L (ref 96–112)
Creatinine, Ser: 0.91 mg/dL (ref 0.50–1.10)
GFR calc Af Amer: >90 mL/min (ref >90)
GFR calc non Af Amer: 81 mL/min — ABNORMAL LOW (ref >90)
Glucose, Bld: 92 mg/dL (ref 70–99)
Potassium: 3.8 mEq/L (ref 3.5–5.1)
Sodium: 139 mEq/L (ref 135–145)
Total Bilirubin: 0.1 mg/dL — ABNORMAL LOW (ref 0.3–1.2)
Total Protein: 6.2 g/dL (ref 6.0–8.3)

## 2012-10-17 LAB — LIPASE, BLOOD: Lipase: 41 U/L (ref 11–59)

## 2012-10-17 LAB — URINE MICROSCOPIC-ADD ON

## 2012-10-17 LAB — PREGNANCY, URINE: Preg Test, Ur: NEGATIVE

## 2012-10-17 MED ORDER — ONDANSETRON HCL 4 MG/2ML IJ SOLN
4.0000 mg | Freq: Once | INTRAMUSCULAR | Status: AC
  Administered 2012-10-17: 4 mg via INTRAVENOUS
  Filled 2012-10-17: qty 2

## 2012-10-17 MED ORDER — DIPHENOXYLATE-ATROPINE 2.5-0.025 MG PO TABS
2.0000 | ORAL_TABLET | Freq: Once | ORAL | Status: AC
  Administered 2012-10-17: 2 via ORAL
  Filled 2012-10-17: qty 2

## 2012-10-17 MED ORDER — DIPHENOXYLATE-ATROPINE 2.5-0.025 MG PO TABS: 1.0000 | ORAL_TABLET | Freq: Four times a day (QID) | ORAL | Status: DC | PRN

## 2012-10-17 MED ORDER — DEXTROSE 5 % IV SOLN
1.0000 g | Freq: Once | INTRAVENOUS | Status: AC
  Administered 2012-10-17: 1 g via INTRAVENOUS
  Filled 2012-10-17: qty 10

## 2012-10-17 MED ORDER — MORPHINE SULFATE 4 MG/ML IJ SOLN
4.0000 mg | Freq: Once | INTRAMUSCULAR | Status: AC
  Administered 2012-10-17: 4 mg via INTRAVENOUS
  Filled 2012-10-17: qty 1

## 2012-10-17 MED ORDER — CEPHALEXIN 500 MG PO CAPS: 500.0000 mg | ORAL_CAPSULE | Freq: Four times a day (QID) | ORAL | Status: DC

## 2012-10-17 MED ORDER — KETOROLAC TROMETHAMINE 30 MG/ML IJ SOLN
30.0000 mg | Freq: Once | INTRAMUSCULAR | Status: AC
  Administered 2012-10-17: 30 mg via INTRAVENOUS
  Filled 2012-10-17: qty 1

## 2012-10-17 NOTE — ED Provider Notes (Signed)
History     CSN: [id number]  Arrival date & time 10/17/12  4098   First MD Initiated Contact with Patient 10/17/12 1955      Chief Complaint  Patient presents with  . Flank Pain    (Consider location/radiation/quality/duration/timing/severity/associated sxs/prior treatment) HPI Comments: Susan [name] is a 37 y.o. Female presenting with a 3 day history of left flank pain, persistent nausea with one episode of nonbloody emesis yesterday, and 4-5 episodes of yellow-brown liquid diarrhea.  She describes cramping,  Pressure like pain in her left flank which is constant.  She has found no alleviators for her symptoms,  Has not taken any pain relievers or antidiarrheal medicines.  She has been afebrile.  She denies any sick contacts including household members.  She denies pain with urination,  But has had increased urinary frequency.  She has no significant past medical history.  She does report occasional etoh use,  Last drank 8 beers yesterday.  She denies history of liver or pancreas problems.     The history is provided by the patient.    Past Medical History  Diagnosis Date  . Ectopic pregnancy     Past Surgical History  Procedure Laterality Date  . Ectopic pregnancy surgery      Family History  Problem Relation Age of Onset  . COPD Mother     History  Substance Use Topics  . Smoking status: Current Every Day Smoker -- 1.00 packs/day  . Smokeless tobacco: Not on file  . Alcohol Use: No    OB History   Grav Para Term Preterm Abortions TAB SAB Ect Mult Living   5 3 3  2   2         Review of Systems  Constitutional: Negative for fever.  HENT: Negative for congestion, sore throat and neck pain.   Eyes: Negative.   Respiratory: Negative for chest tightness and shortness of breath.   Cardiovascular: Negative for chest pain.  Gastrointestinal: Positive for nausea, vomiting and diarrhea. Negative for abdominal pain.  Genitourinary: Positive for frequency.   Musculoskeletal: Negative for joint swelling and arthralgias.  Skin: Negative.  Negative for rash and wound.  Neurological: Negative for dizziness, weakness, light-headedness, numbness and headaches.  Psychiatric/Behavioral: Negative.     Allergies  Review of patient's allergies indicates no known allergies.  Home Medications   Current Outpatient Rx  Name  Route  Sig  Dispense  Refill  . acetaminophen (ARTHRITIS PAIN RELIEF) 650 MG CR tablet   Oral   Take 1,300 mg by mouth daily as needed for pain.         Marland Kitchen albuterol (PROAIR HFA) 108 (90 BASE) MCG/ACT inhaler   Inhalation   Inhale 2 puffs into the lungs every 6 (six) hours as needed for wheezing or shortness of breath.         Marland Kitchen HYDROcodone-acetaminophen (NORCO/VICODIN) 5-325 MG per tablet   Oral   Take 1-2 tablets by mouth every 4 (four) hours as needed for pain. Take one-two tabs po q 4-6 hrs prn pain         . cephALEXin (KEFLEX) 500 MG capsule   Oral   Take 1 capsule (500 mg total) by mouth 4 (four) times daily.   40 capsule   0   . diphenoxylate-atropine (LOMOTIL) 2.5-0.025 MG per tablet   Oral   Take 1 tablet by mouth 4 (four) times daily as needed for diarrhea or loose stools.   12 tablet   0  BP 110/70  Pulse 89  Temp(Src) 98.2 F (36.8 C) (Oral)  Resp 20  Ht 5\' 1"  (1.549 m)  Wt 145 lb (65.772 kg)  BMI 27.41 kg/m2  SpO2 98%  LMP 10/16/2012  Physical Exam  Nursing note and vitals reviewed. Constitutional: She appears well-developed and well-nourished.  HENT:  Head: Normocephalic and atraumatic.  Eyes: Conjunctivae are normal.  Neck: Normal range of motion.  Cardiovascular: Normal rate, regular rhythm, normal heart sounds and intact distal pulses.   Pulmonary/Chest: Effort normal and breath sounds normal. She has no wheezes.  Abdominal: Soft. Bowel sounds are normal. She exhibits no distension and no mass. There is no hepatosplenomegaly. There is tenderness. There is CVA tenderness. There  is no rigidity, no rebound and no guarding.  Musculoskeletal: Normal range of motion.  Neurological: She is alert.  Skin: Skin is warm and dry.  Psychiatric: She has a normal mood and affect.    ED Course  Procedures (including critical care time)  Labs Reviewed  URINALYSIS, ROUTINE W REFLEX MICROSCOPIC - Abnormal; Notable for the following:    Hgb urine dipstick LARGE (*)    Leukocytes, UA SMALL (*)    All other components within normal limits  CBC WITH DIFFERENTIAL - Abnormal; Notable for the following:    WBC 10.9 (*)    All other components within normal limits  COMPREHENSIVE METABOLIC PANEL - Abnormal; Notable for the following:    Total Bilirubin 0.1 (*)    GFR calc non Af Amer 81 (*)    All other components within normal limits  URINE MICROSCOPIC-ADD ON - Abnormal; Notable for the following:    Squamous Epithelial / LPF FEW (*)    Bacteria, UA MANY (*)    All other components within normal limits  URINE CULTURE  PREGNANCY, URINE  LIPASE, BLOOD   Ct Abdomen Pelvis Wo Contrast  10/17/2012  *RADIOLOGY REPORT*  Clinical Data:  Left flank pain, vomiting, diarrhea  CT ABDOMEN AND PELVIS WITHOUT CONTRAST  Technique:  Multidetector CT imaging of the abdomen and pelvis was performed following the standard protocol without intravenous contrast. Oral contrast was not administered.  Sagittal and coronal MPR images reconstructed from axial data set.  Comparison: None  Findings: Minimal dependent atelectasis right lower lobe. Within limits of a nonenhanced exam no focal abnormalities of the liver, spleen, pancreas, kidneys, or adrenal glands. Specifically no urinary tract calcification, hydronephrosis, or ureteral dilatation. Bladder decompressed, unable to evaluate. Unremarkable uterus and adnexae.  Normal appendix, retrocecal in position. Colon incompletely distended, with suboptimal assessment of wall thickness at several sites. Stomach and bowel loops otherwise normal appearance. No mass,  adenopathy, free fluid or inflammatory process. No acute osseous findings.  IMPRESSION: No acute intra abdominal or intrapelvic abnormalities.   Original Report Authenticated By: Ulyses Southward, M.D.      1. UTI (urinary tract infection)   2. Diarrhea    Pt given morphine and zofran per IV,  No relief of pain, but nausea improved.  Toradol 30 mg IV given. Rocephin 1 gram IV given for uti.  Lomotil #2 tabs given prior to dc home.     MDM  Patients labs and/or radiological studies were reviewed during the medical decision making and disposition process. Pt with no emesis or diarrhea while in ed.  Ct findings negative for pyelonephritis and kidney or ureteral stones.  Unclear etiology of diarrhea,  Probably viral.  Pt is not dehydrated per labs,  No electrolyte abnormalities.  She was prescribed keflex for  uti,  Culture pending.  Encouraged rest,  Increased fluids.  Lomotil prn diarrhea.  Recheck by pcp if not improving over the next several days,  Or for any worsened sx.        Burgess Amor, PA-C 10/17/12 2310

## 2012-10-17 NOTE — ED Notes (Signed)
Left flank pain with diarrhea and nausea, denies vomiting, since Saturday, denies blood in urine or burning with urination

## 2012-10-17 NOTE — ED Provider Notes (Signed)
Medical screening examination/treatment/procedure(s) were performed by non-physician practitioner and as supervising physician I was immediately available for consultation/collaboration.   [name], MD 10/17/12 [phone]

## 2012-10-19 LAB — URINE CULTURE
Colony Count: NO GROWTH
Culture: NO GROWTH

## 2013-02-13 ENCOUNTER — Encounter (HOSPITAL_COMMUNITY): Payer: Self-pay | Admitting: *Deleted

## 2013-02-13 ENCOUNTER — Emergency Department (HOSPITAL_COMMUNITY)
Admission: EM | Admit: 2013-02-13 | Discharge: 2013-02-13 | Disposition: A | Discharge status: Home / Self Care | Payer: Medicaid Other | Attending: Emergency Medicine | Admitting: Emergency Medicine

## 2013-02-13 DIAGNOSIS — B9689 Other specified bacterial agents as the cause of diseases classified elsewhere: Secondary | ICD-10-CM | POA: Insufficient documentation

## 2013-02-13 DIAGNOSIS — N739 Female pelvic inflammatory disease, unspecified: Secondary | ICD-10-CM

## 2013-02-13 DIAGNOSIS — R350 Frequency of micturition: Secondary | ICD-10-CM | POA: Insufficient documentation

## 2013-02-13 DIAGNOSIS — Z79899 Other long term (current) drug therapy: Secondary | ICD-10-CM | POA: Insufficient documentation

## 2013-02-13 DIAGNOSIS — R3919 Other difficulties with micturition: Secondary | ICD-10-CM | POA: Insufficient documentation

## 2013-02-13 DIAGNOSIS — N760 Acute vaginitis: Secondary | ICD-10-CM

## 2013-02-13 DIAGNOSIS — R300 Dysuria: Secondary | ICD-10-CM | POA: Insufficient documentation

## 2013-02-13 DIAGNOSIS — F17200 Nicotine dependence, unspecified, uncomplicated: Secondary | ICD-10-CM | POA: Insufficient documentation

## 2013-02-13 DIAGNOSIS — N898 Other specified noninflammatory disorders of vagina: Secondary | ICD-10-CM | POA: Insufficient documentation

## 2013-02-13 DIAGNOSIS — Z8742 Personal history of other diseases of the female genital tract: Secondary | ICD-10-CM | POA: Insufficient documentation

## 2013-02-13 DIAGNOSIS — A499 Bacterial infection, unspecified: Secondary | ICD-10-CM | POA: Insufficient documentation

## 2013-02-13 DIAGNOSIS — R3915 Urgency of urination: Secondary | ICD-10-CM | POA: Insufficient documentation

## 2013-02-13 DIAGNOSIS — R109 Unspecified abdominal pain: Secondary | ICD-10-CM | POA: Insufficient documentation

## 2013-02-13 DIAGNOSIS — R599 Enlarged lymph nodes, unspecified: Secondary | ICD-10-CM | POA: Insufficient documentation

## 2013-02-13 DIAGNOSIS — Z3202 Encounter for pregnancy test, result negative: Secondary | ICD-10-CM | POA: Insufficient documentation

## 2013-02-13 LAB — URINALYSIS, ROUTINE W REFLEX MICROSCOPIC
Bilirubin Urine: NEGATIVE
Glucose, UA: NEGATIVE mg/dL
Ketones, ur: NEGATIVE mg/dL
Leukocytes, UA: NEGATIVE
Nitrite: NEGATIVE
Protein, ur: NEGATIVE mg/dL
Specific Gravity, Urine: >1.03 — ABNORMAL HIGH (ref 1.005–1.030)
Urobilinogen, UA: 0.2 mg/dL (ref 0.0–1.0)
pH: 5.5 (ref 5.0–8.0)

## 2013-02-13 LAB — BASIC METABOLIC PANEL
BUN: 11 mg/dL (ref 6–23)
CO2: 29 mEq/L (ref 19–32)
Calcium: 9.2 mg/dL (ref 8.4–10.5)
Chloride: 103 mEq/L (ref 96–112)
Creatinine, Ser: 0.63 mg/dL (ref 0.50–1.10)
GFR calc Af Amer: >90 mL/min (ref >90)
GFR calc non Af Amer: >90 mL/min (ref >90)
Glucose, Bld: 92 mg/dL (ref 70–99)
Potassium: 3.8 mEq/L (ref 3.5–5.1)
Sodium: 139 mEq/L (ref 135–145)

## 2013-02-13 LAB — URINE MICROSCOPIC-ADD ON

## 2013-02-13 LAB — CBC WITH DIFFERENTIAL/PLATELET
Basophils Absolute: 0 10*3/uL (ref 0.0–0.1)
Basophils Relative: 0 % (ref 0–1)
Eosinophils Absolute: 0.1 10*3/uL (ref 0.0–0.7)
Eosinophils Relative: 1 % (ref 0–5)
HCT: 39.4 % (ref 36.0–46.0)
Hemoglobin: 13.5 g/dL (ref 12.0–15.0)
Lymphocytes Relative: 25 % (ref 12–46)
Lymphs Abs: 3.5 10*3/uL (ref 0.7–4.0)
MCH: 33.1 pg (ref 26.0–34.0)
MCHC: 34.3 g/dL (ref 30.0–36.0)
MCV: 96.6 fL (ref 78.0–100.0)
Monocytes Absolute: 0.5 10*3/uL (ref 0.1–1.0)
Monocytes Relative: 4 % (ref 3–12)
Neutro Abs: 9.9 10*3/uL — ABNORMAL HIGH (ref 1.7–7.7)
Neutrophils Relative %: 71 % (ref 43–77)
Platelets: 261 10*3/uL (ref 150–400)
RBC: 4.08 MIL/uL (ref 3.87–5.11)
RDW: 12.4 % (ref 11.5–15.5)
WBC: 14 10*3/uL — ABNORMAL HIGH (ref 4.0–10.5)

## 2013-02-13 LAB — PREGNANCY, URINE: Preg Test, Ur: NEGATIVE

## 2013-02-13 LAB — WET PREP, GENITAL
Trich, Wet Prep: NONE SEEN
Yeast Wet Prep HPF POC: NONE SEEN

## 2013-02-13 MED ORDER — CEFTRIAXONE SODIUM 1 G IJ SOLR
0.5000 g | Freq: Once | INTRAMUSCULAR | Status: AC
  Administered 2013-02-13: 0.5 g via INTRAMUSCULAR
  Filled 2013-02-13: qty 10

## 2013-02-13 MED ORDER — METRONIDAZOLE 500 MG PO TABS: 500.0000 mg | ORAL_TABLET | Freq: Two times a day (BID) | ORAL | Status: DC

## 2013-02-13 MED ORDER — HYDROCODONE-ACETAMINOPHEN 5-325 MG PO TABS: ORAL_TABLET | ORAL | Status: DC

## 2013-02-13 MED ORDER — LIDOCAINE HCL (PF) 1 % IJ SOLN
INTRAMUSCULAR | Status: AC
  Administered 2013-02-13: 2.1 mL
  Filled 2013-02-13: qty 5

## 2013-02-13 MED ORDER — DOXYCYCLINE HYCLATE 100 MG PO CAPS: 100.0000 mg | ORAL_CAPSULE | Freq: Two times a day (BID) | ORAL | Status: DC

## 2013-02-13 MED ORDER — IBUPROFEN 800 MG PO TABS
800.0000 mg | ORAL_TABLET | Freq: Once | ORAL | Status: AC
  Administered 2013-02-13: 800 mg via ORAL
  Filled 2013-02-13: qty 1

## 2013-02-13 NOTE — ED Notes (Signed)
Was treated yeast infection 2 weeks. Pelvic pain began 1 week ago.  Urinary frequency, no dysuria, malodorous urine.

## 2013-02-13 NOTE — ED Notes (Signed)
Instructions, prescriptions and f/u information given/reviewed - verbalizes understanding.  Pt in no apparent distress at time of discharge.

## 2013-02-14 LAB — GC/CHLAMYDIA PROBE AMP
CT Probe RNA: NEGATIVE
GC Probe RNA: NEGATIVE

## 2013-02-16 NOTE — ED Provider Notes (Signed)
History    CSN: [id number] Arrival date & time 02/13/13  1601  First MD Initiated Contact with Patient 02/13/13 1645     Chief Complaint  Patient presents with  . Pelvic Pain   (Consider location/radiation/quality/duration/timing/severity/associated sxs/prior Treatment) HPI Comments: AHRIA SLAPPEY is a 37 y.o. female who presents to the Emergency Department complaining of pelvic pain, urinary frequency, vaginal d/c, and malodorous urine.  States she was treated by her PMD 2 weeks ago for a yeast infection and states the sx's never completely cleared up.  She denies vaginal bleeding, new sexual partners, hematuria, fever, abd pain or vomiting  Patient is a 38 y.o. female presenting with pelvic pain. The history is provided by the patient.  Pelvic Pain This is a new problem. The current episode started 1 to 4 weeks ago. The problem occurs constantly. The problem has been unchanged. Associated symptoms include urinary symptoms. Pertinent negatives include no abdominal pain, arthralgias, chest pain, chills, coughing, fever, headaches, joint swelling, nausea, neck pain, numbness, rash, swollen glands, vertigo, visual change, vomiting or weakness. Nothing aggravates the symptoms. She has tried nothing for the symptoms. The treatment provided no relief.   Past Medical History  Diagnosis Date  . Ectopic pregnancy    Past Surgical History  Procedure Laterality Date  . Ectopic pregnancy surgery     Family History  Problem Relation Age of Onset  . COPD Mother    History  Substance Use Topics  . Smoking status: Current Every Day Smoker -- 1.00 packs/day  . Smokeless tobacco: Not on file  . Alcohol Use: No   OB History   Grav Para Term Preterm Abortions TAB SAB Ect Mult Living   5 3 3  2   2        Review of Systems  Constitutional: Negative for fever, chills and appetite change.  HENT: Negative for neck pain.   Respiratory: Negative for cough and shortness of breath.    Cardiovascular: Negative for chest pain.  Gastrointestinal: Negative for nausea, vomiting, abdominal pain, blood in stool and abdominal distention.  Genitourinary: Positive for dysuria, urgency, frequency, vaginal discharge and pelvic pain. Negative for hematuria, flank pain, decreased urine volume, vaginal bleeding, difficulty urinating, genital sores and menstrual problem.  Musculoskeletal: Negative for back pain, joint swelling and arthralgias.  Skin: Negative for color change and rash.  Neurological: Negative for dizziness, vertigo, weakness, numbness and headaches.  Hematological: Negative for adenopathy.  All other systems reviewed and are negative.    Allergies  Review of patient's allergies indicates no known allergies.  Home Medications   Current Outpatient Rx  Name  Route  Sig  Dispense  Refill  . albuterol (PROAIR HFA) 108 (90 BASE) MCG/ACT inhaler   Inhalation   Inhale 2 puffs into the lungs every 6 (six) hours as needed for wheezing or shortness of breath.         Marland Kitchen FLUoxetine (PROZAC) 20 MG capsule   Oral   Take 20 mg by mouth daily.         . hydrOXYzine (ATARAX/VISTARIL) 25 MG tablet   Oral   Take 25 mg by mouth 2 (two) times daily.         . traZODone (DESYREL) 100 MG tablet   Oral   Take 100 mg by mouth at bedtime.         Marland Kitchen doxycycline (VIBRAMYCIN) 100 MG capsule   Oral   Take 1 capsule (100 mg total) by mouth 2 (two) times daily.  20 capsule   0   . HYDROcodone-acetaminophen (NORCO/VICODIN) 5-325 MG per tablet      Take one-two tabs po q 4-6 hrs prn pain   10 tablet   0   . metroNIDAZOLE (FLAGYL) 500 MG tablet   Oral   Take 1 tablet (500 mg total) by mouth 2 (two) times daily.   20 tablet   0    BP 100/55  Pulse 80  Temp(Src) 98.7 F (37.1 C) (Oral)  Resp 16  Ht 5\' 1"  (1.549 m)  Wt 160 lb (72.576 kg)  BMI 30.25 kg/m2  SpO2 98%  LMP 01/15/2013 Physical Exam  Nursing note and vitals reviewed. Constitutional: She is oriented  to person, place, and time. She appears well-developed and well-nourished. No distress.  HENT:  Head: Normocephalic and atraumatic.  Mouth/Throat: Oropharynx is clear and moist.  Neck: Normal range of motion. Neck supple.  Cardiovascular: Normal rate, regular rhythm, normal heart sounds and intact distal pulses.   No murmur heard. Pulmonary/Chest: Effort normal and breath sounds normal. No respiratory distress.  Abdominal: Soft. Bowel sounds are normal. She exhibits no distension and no mass. There is no hepatosplenomegaly. There is tenderness in the suprapubic area. There is no rebound, no guarding, no CVA tenderness and no tenderness at McBurney's point.  Genitourinary: Uterus normal. There is no rash, tenderness or lesion on the right labia. There is no rash, tenderness or lesion on the left labia. Cervix exhibits motion tenderness. Cervix exhibits no discharge and no friability. Right adnexum displays no mass and no tenderness. Left adnexum displays no mass and no tenderness. Vaginal discharge found.  Musculoskeletal: She exhibits no edema.  Lymphadenopathy:    She has cervical adenopathy.  Neurological: She is alert and oriented to person, place, and time. She exhibits normal muscle tone. Coordination normal.  Skin: Skin is warm and dry.  Psychiatric: She has a normal mood and affect.    ED Course  Procedures (including critical care time) Labs Reviewed  WET PREP, GENITAL - Abnormal; Notable for the following:    Clue Cells Wet Prep HPF POC MODERATE (*)    WBC, Wet Prep HPF POC FEW (*)    All other components within normal limits  URINALYSIS, ROUTINE W REFLEX MICROSCOPIC - Abnormal; Notable for the following:    Specific Gravity, Urine >1.030 (*)    Hgb urine dipstick TRACE (*)    All other components within normal limits  CBC WITH DIFFERENTIAL - Abnormal; Notable for the following:    WBC 14.0 (*)    Neutro Abs 9.9 (*)    All other components within normal limits  URINE  MICROSCOPIC-ADD ON - Abnormal; Notable for the following:    Squamous Epithelial / LPF FEW (*)    All other components within normal limits  GC/CHLAMYDIA PROBE AMP  PREGNANCY, URINE  BASIC METABOLIC PANEL   No results found. 1. PID (pelvic inflammatory disease)   2. Bacterial vaginosis     MDM    Patient reports unprotected sex with vaginal d/c, CMT on exam. No CVA tenderness or abdominal pain.  Pt is well appearing, VSS.   Will treat with rocephin , doxy and flagyl.  Pt agrees to close f/u with her GYN.  Or to return here if sx's worsen.    Krithik Mapel L. Trisha Mangle, PA-C 02/16/13 2145

## 2013-02-17 ENCOUNTER — Encounter (HOSPITAL_COMMUNITY): Payer: Self-pay

## 2013-02-17 ENCOUNTER — Emergency Department (HOSPITAL_COMMUNITY)
Admission: EM | Admit: 2013-02-17 | Discharge: 2013-02-17 | Disposition: A | Discharge status: Home / Self Care | Payer: Medicaid Other | Attending: Emergency Medicine | Admitting: Emergency Medicine

## 2013-02-17 ENCOUNTER — Emergency Department (HOSPITAL_COMMUNITY): Payer: Medicaid Other

## 2013-02-17 DIAGNOSIS — W19XXXA Unspecified fall, initial encounter: Secondary | ICD-10-CM

## 2013-02-17 DIAGNOSIS — R42 Dizziness and giddiness: Secondary | ICD-10-CM | POA: Insufficient documentation

## 2013-02-17 DIAGNOSIS — S0285XA Fracture of orbit, unspecified, initial encounter for closed fracture: Secondary | ICD-10-CM

## 2013-02-17 DIAGNOSIS — Z79899 Other long term (current) drug therapy: Secondary | ICD-10-CM | POA: Insufficient documentation

## 2013-02-17 DIAGNOSIS — F17200 Nicotine dependence, unspecified, uncomplicated: Secondary | ICD-10-CM | POA: Insufficient documentation

## 2013-02-17 DIAGNOSIS — S0990XA Unspecified injury of head, initial encounter: Secondary | ICD-10-CM | POA: Insufficient documentation

## 2013-02-17 DIAGNOSIS — Y929 Unspecified place or not applicable: Secondary | ICD-10-CM | POA: Insufficient documentation

## 2013-02-17 DIAGNOSIS — W010XXA Fall on same level from slipping, tripping and stumbling without subsequent striking against object, initial encounter: Secondary | ICD-10-CM | POA: Insufficient documentation

## 2013-02-17 DIAGNOSIS — W1809XA Striking against other object with subsequent fall, initial encounter: Secondary | ICD-10-CM | POA: Insufficient documentation

## 2013-02-17 DIAGNOSIS — S0230XA Fracture of orbital floor, unspecified side, initial encounter for closed fracture: Secondary | ICD-10-CM | POA: Insufficient documentation

## 2013-02-17 DIAGNOSIS — R110 Nausea: Secondary | ICD-10-CM | POA: Insufficient documentation

## 2013-02-17 DIAGNOSIS — Y9301 Activity, walking, marching and hiking: Secondary | ICD-10-CM | POA: Insufficient documentation

## 2013-02-17 MED ORDER — OXYCODONE-ACETAMINOPHEN 5-325 MG PO TABS: 2.0000 | ORAL_TABLET | Freq: Once | ORAL | Status: AC

## 2013-02-17 MED ORDER — OXYCODONE-ACETAMINOPHEN 5-325 MG PO TABS: 2.0000 | ORAL_TABLET | ORAL | Status: DC | PRN

## 2013-02-17 MED ORDER — AMOXICILLIN-POT CLAVULANATE 875-125 MG PO TABS: 1.0000 | ORAL_TABLET | Freq: Two times a day (BID) | ORAL | Status: DC

## 2013-02-17 MED ORDER — OXYCODONE-ACETAMINOPHEN 5-325 MG PO TABS
ORAL_TABLET | ORAL | Status: AC
  Administered 2013-02-17: 2 via ORAL
  Filled 2013-02-17: qty 2

## 2013-02-17 NOTE — ED Provider Notes (Signed)
Medical screening examination/treatment/procedure(s) were performed by non-physician practitioner and as supervising physician I was immediately available for consultation/collaboration.   Siedah Sedor L Darien Mignogna, MD 02/17/13 1533 

## 2013-02-17 NOTE — ED Notes (Signed)
Pt reports that she got tangled in her dogs leash and fell on concrete steps 2 days ago, hit her head at the time, denies loc, cont. To have nausea. And severe headache. 2 black eyes noted.

## 2013-02-17 NOTE — ED Provider Notes (Signed)
History  This chart was scribed for Donnetta Hutching, MD by Ardelia Mems, ED Scribe. This patient was seen in room APA09/AP[facility]09 and the patient's care was started at 3:17 PM.  CSN: [id number]  Arrival date & time 02/17/13  1459   Chief Complaint  Patient presents with  . Fall    The history is provided by the patient. No language interpreter was used.   HPI Comments: Susan [name] is a 37 y.o. female who presents to the Emergency Department complaining of persistent generalized headache and nausea onset after an accidental fall that occurred 2 days ago. Pt states that "my whole head is sore", and she also reports feeling light headed. There is some associated bruising around and above her left eye. Pt states that she was walking up concrete steps, when she tripped over a dog leash, fell and hit her middle forehead on the concrete steps. Pt denies visual disturbance, vomiting or LOC. She also denies injury or pain to the bridge of her nose. Pt states that she has no chronic medical conditions. She states that she took Tylenol for current symtpoms with only mild relief. Pt denies alcohol use and is a current every day smoker of 1 pack/day.  PCP- Dr. Vivia Ewing   Past Medical History  Diagnosis Date  . Ectopic pregnancy    Past Surgical History  Procedure Laterality Date  . Ectopic pregnancy surgery     Family History  Problem Relation Age of Onset  . COPD Mother    History  Substance Use Topics  . Smoking status: Current Every Day Smoker -- 1.00 packs/day    Types: Cigarettes  . Smokeless tobacco: Not on file  . Alcohol Use: No   OB History   Grav Para Term Preterm Abortions TAB SAB Ect Mult Living   5 3 3  2   2        Review of Systems  Constitutional: Negative for fever and chills.  HENT: Negative for congestion, sore throat, rhinorrhea and neck pain.   Eyes: Negative for visual disturbance.  Respiratory: Negative for cough and shortness of breath.   Cardiovascular:  Negative for chest pain.  Gastrointestinal: Positive for nausea. Negative for vomiting, abdominal pain and diarrhea.  Genitourinary: Negative for dysuria.  Musculoskeletal: Negative for back pain.  Skin: Negative for rash.  Neurological: Positive for light-headedness and headaches. Negative for syncope.  Psychiatric/Behavioral: Negative for confusion.  A complete 10 system review of systems was obtained and all systems are negative except as noted in the HPI and PMH.   Allergies  Review of patient's allergies indicates no known allergies.  Home Medications   Current Outpatient Rx  Name  Route  Sig  Dispense  Refill  . albuterol (PROAIR HFA) 108 (90 BASE) MCG/ACT inhaler   Inhalation   Inhale 2 puffs into the lungs every 6 (six) hours as needed for wheezing or shortness of breath.         . doxycycline (VIBRAMYCIN) 100 MG capsule   Oral   Take 1 capsule (100 mg total) by mouth 2 (two) times daily.   20 capsule   0   . FLUoxetine (PROZAC) 20 MG capsule   Oral   Take 20 mg by mouth daily.         Marland Kitchen HYDROcodone-acetaminophen (NORCO/VICODIN) 5-325 MG per tablet      Take one-two tabs po q 4-6 hrs prn pain   10 tablet   0   . hydrOXYzine (ATARAX/VISTARIL) 25 MG  tablet   Oral   Take 25 mg by mouth 2 (two) times daily.         . metroNIDAZOLE (FLAGYL) 500 MG tablet   Oral   Take 1 tablet (500 mg total) by mouth 2 (two) times daily.   20 tablet   0   . traZODone (DESYREL) 100 MG tablet   Oral   Take 100 mg by mouth at bedtime.          Triage Vitals: BP 99/58  Pulse 76  Temp(Src) 98.8 F (37.1 C)  Resp 18  Ht 5\' 1"  (1.549 m)  Wt 160 lb (72.576 kg)  BMI 30.25 kg/m2  SpO2 99%  LMP 01/15/2013  Physical Exam  Nursing note and vitals reviewed. Constitutional: She is oriented to person, place, and time. She appears well-developed and well-nourished.  HENT:  Head: Normocephalic and atraumatic.  Eyes: Conjunctivae and EOM are normal. Pupils are equal, round,  and reactive to light.  Neck: Normal range of motion. Neck supple.  Cardiovascular: Normal rate, regular rhythm and normal heart sounds.   Pulmonary/Chest: Effort normal and breath sounds normal.  Abdominal: Soft. Bowel sounds are normal.  Musculoskeletal: Normal range of motion.  Mild tenderness in the midline of her forehead.  Neurological: She is alert and oriented to person, place, and time.  Skin: Skin is warm and dry.  Some ecchymosis on left forehead.  Psychiatric: She has a normal mood and affect.    ED Course  Procedures (including critical care time)  DIAGNOSTIC STUDIES: Oxygen Saturation is 99% on RA, normal by my interpretation.    COORDINATION OF CARE: 3:22 PM- Pt advised of plan to receive at CT of her head and pt agrees.  Medications  oxyCODONE-acetaminophen (PERCOCET/ROXICET) 5-325 MG per tablet 2 tablet (2 tablets Oral Given 02/17/13 1631)   Labs Reviewed - No data to display  Ct Head Wo Contrast  02/17/2013   *RADIOLOGY REPORT*  Clinical Data: Recent fall.  Headache  CT HEAD WITHOUT CONTRAST  Technique:  Contiguous axial images were obtained from the base of the skull through the vertex without contrast.  Comparison: None  Findings: Ventricle size is normal.  Negative for intracranial hemorrhage.  Negative for infarct or mass lesion.  Fracture of the right medial orbit.  Orbital fat herniates into the ethmoid sinus.  There is gas in the right orbit compatible with recent fracture.  There may also be a fracture of the orbital floor.  CT of the face is suggested for further evaluation.  IMPRESSION: No acute intracranial abnormality.  Fracture of the right medial orbit and possibly right orbital floor.  CT maxillofacial is suggested for further evaluation.   Original Report Authenticated By: Janeece Riggers, M.D.  Ct Head Wo Contrast  02/17/2013   *RADIOLOGY REPORT*  Clinical Data: Recent fall.  Headache  CT HEAD WITHOUT CONTRAST  Technique:  Contiguous axial images were  obtained from the base of the skull through the vertex without contrast.  Comparison: None  Findings: Ventricle size is normal.  Negative for intracranial hemorrhage.  Negative for infarct or mass lesion.  Fracture of the right medial orbit.  Orbital fat herniates into the ethmoid sinus.  There is gas in the right orbit compatible with recent fracture.  There may also be a fracture of the orbital floor.  CT of the face is suggested for further evaluation.  IMPRESSION: No acute intracranial abnormality.  Fracture of the right medial orbit and possibly right orbital floor.  CT maxillofacial  is suggested for further evaluation.   Original Report Authenticated By: Janeece Riggers, M.D.   Ct Maxillofacial Wo Cm  02/17/2013   *RADIOLOGY REPORT*  Clinical Data: Fall  CT MAXILLOFACIAL WITHOUT CONTRAST  Technique:  Multidetector CT imaging of the maxillofacial structures was performed. Multiplanar CT image reconstructions were also generated.  Comparison: CT from today  Findings: Fractures of the right orbit are present.  Medial orbital fracture is present with herniation of orbital fat into the ethmoid sinus.  Medial rectus muscle is not displaced.  There is a depressed fracture of the orbital floor on the left.  Orbital fat extends into the fracture defect.  The inferior rectus muscle is not displaced.  There is blood in the right maxillary sinus. There is gas in the right orbit related to the fractures.  No other fracture.  IMPRESSION: Fracture of the medial wall of the orbit and fracture of the orbital floor.   Original Report Authenticated By: Janeece Riggers, M.D.   No diagnosis found.  MDM  No neuro deficits.   CT scan reveals an orbital fracture of the medial and inferior wall.   Rx Percocet and Augmentin 875/125 bid for one week.  Refer to ENT        I personally performed the services described in this documentation, which was scribed in my presence. The recorded information has been reviewed and is  accurate.    Donnetta Hutching, MD 02/17/13 1800

## 2013-02-23 ENCOUNTER — Encounter: Payer: Self-pay | Admitting: *Deleted

## 2013-03-06 ENCOUNTER — Ambulatory Visit (INDEPENDENT_AMBULATORY_CARE_PROVIDER_SITE_OTHER): Payer: Medicaid Other | Admitting: Adult Health

## 2013-03-06 ENCOUNTER — Encounter: Payer: Self-pay | Admitting: Adult Health

## 2013-03-06 VITALS — BP 100/60 | Ht 61.0 in | Wt 161.8 lb

## 2013-03-06 DIAGNOSIS — N898 Other specified noninflammatory disorders of vagina: Secondary | ICD-10-CM

## 2013-03-06 DIAGNOSIS — B9689 Other specified bacterial agents as the cause of diseases classified elsewhere: Secondary | ICD-10-CM | POA: Insufficient documentation

## 2013-03-06 DIAGNOSIS — A499 Bacterial infection, unspecified: Secondary | ICD-10-CM

## 2013-03-06 DIAGNOSIS — N760 Acute vaginitis: Secondary | ICD-10-CM

## 2013-03-06 HISTORY — DX: Other specified bacterial agents as the cause of diseases classified elsewhere: N76.0

## 2013-03-06 HISTORY — DX: Other specified bacterial agents as the cause of diseases classified elsewhere: B96.89

## 2013-03-06 LAB — POCT WET PREP (WET MOUNT)
Trichomonas Wet Prep HPF POC: NEGATIVE
WBC, Wet Prep HPF POC: POSITIVE

## 2013-03-06 MED ORDER — METRONIDAZOLE 500 MG PO TABS: 500.0000 mg | ORAL_TABLET | Freq: Two times a day (BID) | ORAL | Status: DC

## 2013-03-06 NOTE — Patient Instructions (Addendum)
Bacterial Vaginosis Bacterial vaginosis (BV) is a vaginal infection where the normal balance of bacteria in the vagina is disrupted. The normal balance is then replaced by an overgrowth of certain bacteria. There are several different kinds of bacteria that can cause BV. BV is the most common vaginal infection in women of childbearing age. CAUSES   The cause of BV is not fully understood. BV develops when there is an increase or imbalance of harmful bacteria.  Some activities or behaviors can upset the normal balance of bacteria in the vagina and put women at increased risk including:  Having a new sex partner or multiple sex partners.  Douching.  Using an intrauterine device (IUD) for contraception.  It is not clear what role sexual activity plays in the development of BV. However, women that have never had sexual intercourse are rarely infected with BV. Women do not get BV from toilet seats, bedding, swimming pools or from touching objects around them.  SYMPTOMS   Grey vaginal discharge.  A fish-like odor with discharge, especially after sexual intercourse.  Itching or burning of the vagina and vulva.  Burning or pain with urination.  Some women have no signs or symptoms at all. DIAGNOSIS  Your caregiver must examine the vagina for signs of BV. Your caregiver will perform lab tests and look at the sample of vaginal fluid through a microscope. They will look for bacteria and abnormal cells (clue cells), a pH test higher than 4.5, and a positive amine test all associated with BV.  RISKS AND COMPLICATIONS   Pelvic inflammatory disease (PID).  Infections following gynecology surgery.  Developing HIV.  Developing herpes virus. TREATMENT  Sometimes BV will clear up without treatment. However, all women with symptoms of BV should be treated to avoid complications, especially if gynecology surgery is planned. Female partners generally do not need to be treated. However, BV may spread  between female sex partners so treatment is helpful in preventing a recurrence of BV.   BV may be treated with antibiotics. The antibiotics come in either pill or vaginal cream forms. Either can be used with nonpregnant or pregnant women, but the recommended dosages differ. These antibiotics are not harmful to the baby.  BV can recur after treatment. If this happens, a second round of antibiotics will often be prescribed.  Treatment is important for pregnant women. If not treated, BV can cause a premature delivery, especially for a pregnant woman who had a premature birth in the past. All pregnant women who have symptoms of BV should be checked and treated.  For chronic reoccurrence of BV, treatment with a type of prescribed gel vaginally twice a week is helpful. HOME CARE INSTRUCTIONS   Finish all medication as directed by your caregiver.  Do not have sex until treatment is completed.  Tell your sexual partner that you have a vaginal infection. They should see their caregiver and be treated if they have problems, such as a mild rash or itching.  Practice safe sex. Use condoms. Only have 1 sex partner. PREVENTION  Basic prevention steps can help reduce the risk of upsetting the natural balance of bacteria in the vagina and developing BV:  Do not have sexual intercourse (be abstinent).  Do not douche.  Use all of the medicine prescribed for treatment of BV, even if the signs and symptoms go away.  Tell your sex partner if you have BV. That way, they can be treated, if needed, to prevent reoccurrence. SEEK MEDICAL CARE IF:     Your symptoms are not improving after 3 days of treatment.  You have increased discharge, pain, or fever. MAKE SURE YOU:   Understand these instructions.  Will watch your condition.  Will get help right away if you are not doing well or get worse. FOR MORE INFORMATION  Division of STD Prevention (DSTDP), Centers for Disease Control and Prevention:  [employer] American Social Health Association (ASHA): www.ashastd.org  Document Released: 07/26/2005 Document Revised: 10/18/2011 Document Reviewed: 01/16/2009 [facility] Patient Information 2014 [place], [place]. Take flagyl no alcohol Follow up in 2 weeks for pap and physical

## 2013-03-06 NOTE — Assessment & Plan Note (Signed)
Rx flagyl GC/CHL sent

## 2013-03-06 NOTE — Progress Notes (Signed)
Subjective:     Patient ID: Susan [name], female   DOB: Oct 17, 1975, 37 y.o.   MRN: [id number]  HPI Susan [name] is a 37 year old white female in complaining of a discharge and odor, she is sexually active with one partner x 15 months, he had vasectomy and they do not use a condom.She has some burning also.  Review of Systems Positives in HPI Reviewed past medical,surgical, social and family history. Reviewed medications and allergies.     Objective:   Physical Exam BP 100/60  Ht 5\' 1"  (1.549 m)  Wt 161 lb 12.8 oz (73.392 kg)  BMI 30.59 kg/m2  LMP 03/01/2013 Skin warm and dry.Pelvic: external genitalia is normal in appearance, vagina: pinkish discharge with odor, cervix:smooth and bulbous, uterus: normal size, shape and contour, non tender, no masses felt, adnexa: no masses or tenderness noted. Wet prep: + for clue cells and +WBCs. GC/CHL obtained.     Assessment:      Vaginal discharge BV    Plan:    GC/CHL sent to lab  Rx flagyl 500 mg 1 bid x 7 days #14 with no refills, no alcohol Return in 2 weeks for Pap and physical   Review handout on BV

## 2013-03-07 LAB — GC/CHLAMYDIA PROBE AMP
CT Probe RNA: NEGATIVE
GC Probe RNA: NEGATIVE

## 2013-03-21 ENCOUNTER — Other Ambulatory Visit: Payer: Medicaid Other | Admitting: Adult Health

## 2013-03-24 ENCOUNTER — Other Ambulatory Visit (HOSPITAL_COMMUNITY): Payer: Self-pay | Admitting: Family Medicine

## 2013-03-24 ENCOUNTER — Other Ambulatory Visit (HOSPITAL_COMMUNITY): Payer: Self-pay

## 2013-03-24 DIAGNOSIS — Z0271 Encounter for disability determination: Secondary | ICD-10-CM

## 2013-03-26 ENCOUNTER — Ambulatory Visit (HOSPITAL_COMMUNITY)
Admission: RE | Admit: 2013-03-26 | Discharge: 2013-03-26 | Disposition: A | Discharge status: Home / Self Care | Payer: Medicaid Other | Source: Ambulatory Visit | Attending: Internal Medicine | Admitting: Internal Medicine

## 2013-03-26 ENCOUNTER — Other Ambulatory Visit (HOSPITAL_COMMUNITY): Payer: Self-pay | Admitting: *Deleted

## 2013-03-26 DIAGNOSIS — M545 Low back pain, unspecified: Secondary | ICD-10-CM | POA: Insufficient documentation

## 2013-03-26 DIAGNOSIS — J449 Chronic obstructive pulmonary disease, unspecified: Secondary | ICD-10-CM

## 2013-03-27 ENCOUNTER — Other Ambulatory Visit (HOSPITAL_COMMUNITY): Payer: Self-pay | Admitting: *Deleted

## 2013-04-14 ENCOUNTER — Emergency Department (HOSPITAL_COMMUNITY)
Admission: EM | Admit: 2013-04-14 | Discharge: 2013-04-14 | Disposition: A | Discharge status: Home / Self Care | Payer: Medicaid Other | Attending: Emergency Medicine | Admitting: Emergency Medicine

## 2013-04-14 ENCOUNTER — Emergency Department (HOSPITAL_COMMUNITY): Payer: Medicaid Other

## 2013-04-14 ENCOUNTER — Encounter (HOSPITAL_COMMUNITY): Payer: Self-pay | Admitting: *Deleted

## 2013-04-14 DIAGNOSIS — J45909 Unspecified asthma, uncomplicated: Secondary | ICD-10-CM | POA: Insufficient documentation

## 2013-04-14 DIAGNOSIS — X500XXA Overexertion from strenuous movement or load, initial encounter: Secondary | ICD-10-CM | POA: Insufficient documentation

## 2013-04-14 DIAGNOSIS — Y929 Unspecified place or not applicable: Secondary | ICD-10-CM | POA: Insufficient documentation

## 2013-04-14 DIAGNOSIS — Z79899 Other long term (current) drug therapy: Secondary | ICD-10-CM | POA: Insufficient documentation

## 2013-04-14 DIAGNOSIS — Y9389 Activity, other specified: Secondary | ICD-10-CM | POA: Insufficient documentation

## 2013-04-14 DIAGNOSIS — Z8742 Personal history of other diseases of the female genital tract: Secondary | ICD-10-CM | POA: Insufficient documentation

## 2013-04-14 DIAGNOSIS — S93409A Sprain of unspecified ligament of unspecified ankle, initial encounter: Secondary | ICD-10-CM | POA: Insufficient documentation

## 2013-04-14 DIAGNOSIS — F17200 Nicotine dependence, unspecified, uncomplicated: Secondary | ICD-10-CM | POA: Insufficient documentation

## 2013-04-14 DIAGNOSIS — Z8619 Personal history of other infectious and parasitic diseases: Secondary | ICD-10-CM | POA: Insufficient documentation

## 2013-04-14 DIAGNOSIS — R296 Repeated falls: Secondary | ICD-10-CM | POA: Insufficient documentation

## 2013-04-14 MED ORDER — DICLOFENAC SODIUM 75 MG PO TBEC: 75.0000 mg | DELAYED_RELEASE_TABLET | Freq: Two times a day (BID) | ORAL | Status: DC

## 2013-04-14 MED ORDER — HYDROCODONE-ACETAMINOPHEN 5-325 MG PO TABS: 1.0000 | ORAL_TABLET | ORAL | Status: DC | PRN

## 2013-04-14 MED ORDER — KETOROLAC TROMETHAMINE 10 MG PO TABS
10.0000 mg | ORAL_TABLET | Freq: Once | ORAL | Status: AC
  Administered 2013-04-14: 10 mg via ORAL
  Filled 2013-04-14: qty 1

## 2013-04-14 MED ORDER — HYDROCODONE-ACETAMINOPHEN 5-325 MG PO TABS
2.0000 | ORAL_TABLET | Freq: Once | ORAL | Status: AC
  Administered 2013-04-14: 2 via ORAL
  Filled 2013-04-14: qty 2

## 2013-04-14 NOTE — ED Provider Notes (Signed)
Medical screening examination/treatment/procedure(s) were performed by non-physician practitioner and as supervising physician I was immediately available for consultation/collaboration.  Donnetta Hutching, MD 04/14/13 [phone]

## 2013-04-14 NOTE — ED Notes (Signed)
Pt states that she initially injured her right ankle a few days ago after a fall, was trying to step off a porch yesterday with her right ankle causing her to fall ankle, pt has pain and swelling to right ankle, cms intact distal

## 2013-04-14 NOTE — ED Notes (Signed)
Pt reports pain 9/10 but is walking around room, denies further questions regarding discharge, verbalizes understanding of discharge instructions

## 2013-04-14 NOTE — ED Provider Notes (Signed)
CSN: [id number]     Arrival date & time 04/14/13  1027 History   First MD Initiated Contact with Patient 04/14/13 1234     Chief Complaint  Patient presents with  . Ankle Pain   (Consider location/radiation/quality/duration/timing/severity/associated sxs/prior Treatment) Patient is a 37 y.o. female presenting with ankle pain. The history is provided by the patient.  Ankle Pain Location:  Ankle Time since incident:  1 day Injury: yes   Mechanism of injury: fall   Mechanism of injury comment:  Twisted the right ankle twice. Fall:    Fall occurred:  Down stairs   Impact surface:  Hard floor   Entrapped after fall: no   Pain details:    Quality:  Aching, shooting and throbbing   Radiates to:  Does not radiate   Severity:  Severe   Onset quality:  Sudden   Timing:  Constant   Progression:  Worsening Chronicity:  Recurrent Dislocation: no   Foreign body present:  No foreign bodies Prior injury to area:  Yes Relieved by:  Nothing Worsened by:  Bearing weight Ineffective treatments:  Acetaminophen and NSAIDs Associated symptoms: swelling   Associated symptoms: no back pain, no neck pain, no numbness and no tingling     Past Medical History  Diagnosis Date  . Ectopic pregnancy   . Asthma   . Mental disorder     panic attacks  . Trichomonas vaginitis   . BV (bacterial vaginosis) 03/06/2013   Past Surgical History  Procedure Laterality Date  . Ectopic pregnancy surgery     Family History  Problem Relation Age of Onset  . COPD Mother    History  Substance Use Topics  . Smoking status: Current Every Day Smoker -- 1.00 packs/day    Types: Cigarettes  . Smokeless tobacco: Never Used  . Alcohol Use: No   OB History   Grav Para Term Preterm Abortions TAB SAB Ect Mult Living   6 3 3  3 2  1        Review of Systems  Constitutional: Negative for activity change.       All ROS Neg except as noted in HPI  HENT: Negative for nosebleeds and neck pain.   Eyes: Negative for  photophobia and discharge.  Respiratory: Negative for cough, shortness of breath and wheezing.   Cardiovascular: Negative for chest pain and palpitations.  Gastrointestinal: Negative for abdominal pain and blood in stool.  Genitourinary: Negative for dysuria, frequency and hematuria.  Musculoskeletal: Negative for back pain and arthralgias.  Skin: Negative.   Neurological: Negative for dizziness, seizures and speech difficulty.  Psychiatric/Behavioral: Negative for hallucinations and confusion.    Allergies  Review of patient's allergies indicates no known allergies.  Home Medications   Current Outpatient Rx  Name  Route  Sig  Dispense  Refill  . acetaminophen (TYLENOL) 325 MG tablet   Oral   Take 650 mg by mouth every 6 (six) hours as needed for pain.         Marland Kitchen albuterol (PROAIR HFA) 108 (90 BASE) MCG/ACT inhaler   Inhalation   Inhale 2 puffs into the lungs every 6 (six) hours as needed for wheezing or shortness of breath.         Marland Kitchen FLUoxetine (PROZAC) 20 MG capsule   Oral   Take 20 mg by mouth daily.         Marland Kitchen HYDROcodone-acetaminophen (NORCO/VICODIN) 5-325 MG per tablet   Oral   Take 1 tablet by mouth every 6 (  six) hours as needed for pain.         . hydrOXYzine (ATARAX/VISTARIL) 25 MG tablet   Oral   Take 25 mg by mouth 2 (two) times daily.         Marland Kitchen ibuprofen (ADVIL,MOTRIN) 200 MG tablet   Oral   Take 800 mg by mouth every 6 (six) hours as needed for pain.         . traZODone (DESYREL) 100 MG tablet   Oral   Take 100 mg by mouth at bedtime.          BP 122/99  Pulse 95  Temp(Src) 97.8 F (36.6 C) (Oral)  Resp 18  Ht 5\' 1"  (1.549 m)  Wt 160 lb (72.576 kg)  BMI 30.25 kg/m2  SpO2 98%  LMP 04/01/2013 Physical Exam  Nursing note and vitals reviewed. Constitutional: She is oriented to person, place, and time. She appears well-developed and well-nourished.  Non-toxic appearance.  HENT:  Head: Normocephalic.  Right Ear: Tympanic membrane and  external ear normal.  Left Ear: Tympanic membrane and external ear normal.  Eyes: EOM and lids are normal. Pupils are equal, round, and reactive to light.  Neck: Normal range of motion. Neck supple. Carotid bruit is not present.  Cardiovascular: Normal rate, regular rhythm, normal heart sounds, intact distal pulses and normal pulses.   Pulmonary/Chest: Breath sounds normal. No respiratory distress.  Abdominal: Soft. Bowel sounds are normal. There is no tenderness. There is no guarding.  Musculoskeletal: Normal range of motion.  Swelling, pain, and bruising of the lateral malleolus on the right. FROM of the toes. Achilles intact.  Lymphadenopathy:       Head (right side): No submandibular adenopathy present.       Head (left side): No submandibular adenopathy present.    She has no cervical adenopathy.  Neurological: She is alert and oriented to person, place, and time. She has normal strength. No cranial nerve deficit or sensory deficit.  Skin: Skin is warm and dry.  Psychiatric: She has a normal mood and affect. Her speech is normal.    ED Course  Procedures (including critical care time) Labs Review Labs Reviewed - No data to display Imaging Review Dg Ankle Complete Right  04/14/2013   *RADIOLOGY REPORT*  Clinical Data: Ankle pain post fall.  RIGHT ANKLE - COMPLETE 3+ VIEW  Comparison: None.  Findings: There is mild soft tissue swelling over the lateral ankle.  There is no acute fracture or dislocation.  IMPRESSION: No acute fracture.   Original Report Authenticated By: Elberta Fortis, M.D.    MDM  No diagnosis found. **I have reviewed nursing notes, vital signs, and all appropriate lab and imaging results for this patient.*  Pt sustained injury to the right ankle x2 this week. Xray of the right ankle is negative for fracture. Pt fitted with aso splint and crutches. Rx for norco and diclofenac given to the patient.  Kathie Dike, PA-C 04/14/13 1331
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
## 2013-04-14 NOTE — ED Notes (Signed)
Pt calling for ride, verbalizes understanding of the fact that she is not to operate machinery or motor vehicles while taking the medication she has been prescribed for pain.  She verbalizes understanding that she is not to drive home from this visit and that she will need to call a ride.  She has called a ride

## 2013-07-12 ENCOUNTER — Emergency Department (HOSPITAL_COMMUNITY): Payer: Medicaid Other

## 2013-07-12 ENCOUNTER — Encounter (HOSPITAL_COMMUNITY): Payer: Self-pay | Admitting: Emergency Medicine

## 2013-07-12 ENCOUNTER — Emergency Department (HOSPITAL_COMMUNITY)
Admission: EM | Admit: 2013-07-12 | Discharge: 2013-07-12 | Disposition: A | Discharge status: Home / Self Care | Payer: Medicaid Other | Attending: Emergency Medicine | Admitting: Emergency Medicine

## 2013-07-12 DIAGNOSIS — Z8742 Personal history of other diseases of the female genital tract: Secondary | ICD-10-CM | POA: Insufficient documentation

## 2013-07-12 DIAGNOSIS — Z8619 Personal history of other infectious and parasitic diseases: Secondary | ICD-10-CM | POA: Insufficient documentation

## 2013-07-12 DIAGNOSIS — J45909 Unspecified asthma, uncomplicated: Secondary | ICD-10-CM | POA: Insufficient documentation

## 2013-07-12 DIAGNOSIS — F17200 Nicotine dependence, unspecified, uncomplicated: Secondary | ICD-10-CM | POA: Insufficient documentation

## 2013-07-12 DIAGNOSIS — R609 Edema, unspecified: Secondary | ICD-10-CM | POA: Insufficient documentation

## 2013-07-12 DIAGNOSIS — S0003XA Contusion of scalp, initial encounter: Secondary | ICD-10-CM | POA: Insufficient documentation

## 2013-07-12 DIAGNOSIS — Z79899 Other long term (current) drug therapy: Secondary | ICD-10-CM | POA: Insufficient documentation

## 2013-07-12 DIAGNOSIS — T7411XA Adult physical abuse, confirmed, initial encounter: Secondary | ICD-10-CM | POA: Insufficient documentation

## 2013-07-12 DIAGNOSIS — S0511XA Contusion of eyeball and orbital tissues, right eye, initial encounter: Secondary | ICD-10-CM

## 2013-07-12 DIAGNOSIS — F410 Panic disorder [episodic paroxysmal anxiety] without agoraphobia: Secondary | ICD-10-CM | POA: Insufficient documentation

## 2013-07-12 DIAGNOSIS — S0510XA Contusion of eyeball and orbital tissues, unspecified eye, initial encounter: Secondary | ICD-10-CM | POA: Insufficient documentation

## 2013-07-12 DIAGNOSIS — H539 Unspecified visual disturbance: Secondary | ICD-10-CM | POA: Insufficient documentation

## 2013-07-12 MED ORDER — OXYCODONE-ACETAMINOPHEN 5-325 MG PO TABS: 1.0000 | ORAL_TABLET | ORAL | Status: DC | PRN

## 2013-07-12 MED ORDER — OXYCODONE-ACETAMINOPHEN 5-325 MG PO TABS
1.0000 | ORAL_TABLET | Freq: Once | ORAL | Status: AC
  Administered 2013-07-12: 1 via ORAL
  Filled 2013-07-12: qty 1

## 2013-07-12 MED ORDER — IBUPROFEN 600 MG PO TABS: 600.0000 mg | ORAL_TABLET | Freq: Three times a day (TID) | ORAL | Status: DC | PRN

## 2013-07-12 NOTE — ED Notes (Signed)
Pt requesting something for pain, PA aware, orders given
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
## 2013-07-12 NOTE — ED Provider Notes (Signed)
Medical screening examination/treatment/procedure(s) were performed by non-physician practitioner and as supervising physician I was immediately available for consultation/collaboration.  EKG Interpretation   None         Blessings Inglett L Chrystel Barefield, MD 07/12/13 1950 
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
## 2013-07-12 NOTE — ED Notes (Signed)
Patient given discharge instruction, verbalized understand. Patient ambulatory out of the department.  
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
## 2013-08-24 ENCOUNTER — Emergency Department (HOSPITAL_COMMUNITY)
Admission: EM | Admit: 2013-08-24 | Discharge: 2013-08-24 | Disposition: A | Discharge status: Home / Self Care | Payer: Self-pay | Attending: Emergency Medicine | Admitting: Emergency Medicine

## 2013-08-24 ENCOUNTER — Emergency Department (HOSPITAL_COMMUNITY): Payer: Self-pay

## 2013-08-24 ENCOUNTER — Encounter (HOSPITAL_COMMUNITY): Payer: Self-pay | Admitting: Emergency Medicine

## 2013-08-24 DIAGNOSIS — Y939 Activity, unspecified: Secondary | ICD-10-CM | POA: Insufficient documentation

## 2013-08-24 DIAGNOSIS — J45909 Unspecified asthma, uncomplicated: Secondary | ICD-10-CM | POA: Insufficient documentation

## 2013-08-24 DIAGNOSIS — Z8742 Personal history of other diseases of the female genital tract: Secondary | ICD-10-CM | POA: Insufficient documentation

## 2013-08-24 DIAGNOSIS — R296 Repeated falls: Secondary | ICD-10-CM | POA: Insufficient documentation

## 2013-08-24 DIAGNOSIS — Z79899 Other long term (current) drug therapy: Secondary | ICD-10-CM | POA: Insufficient documentation

## 2013-08-24 DIAGNOSIS — F410 Panic disorder [episodic paroxysmal anxiety] without agoraphobia: Secondary | ICD-10-CM | POA: Insufficient documentation

## 2013-08-24 DIAGNOSIS — S40019A Contusion of unspecified shoulder, initial encounter: Secondary | ICD-10-CM | POA: Insufficient documentation

## 2013-08-24 DIAGNOSIS — F17200 Nicotine dependence, unspecified, uncomplicated: Secondary | ICD-10-CM | POA: Insufficient documentation

## 2013-08-24 DIAGNOSIS — Y929 Unspecified place or not applicable: Secondary | ICD-10-CM | POA: Insufficient documentation

## 2013-08-24 MED ORDER — HYDROCODONE-ACETAMINOPHEN 5-325 MG PO TABS
1.0000 | ORAL_TABLET | Freq: Once | ORAL | Status: AC
  Administered 2013-08-24: 1 via ORAL
  Filled 2013-08-24: qty 1

## 2013-08-24 MED ORDER — CYCLOBENZAPRINE HCL 5 MG PO TABS: 5.0000 mg | ORAL_TABLET | Freq: Three times a day (TID) | ORAL | Status: DC | PRN

## 2013-08-24 MED ORDER — MELOXICAM 7.5 MG PO TABS: 7.5000 mg | ORAL_TABLET | Freq: Every day | ORAL | Status: DC
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
## 2013-08-24 NOTE — ED Provider Notes (Signed)
THIS IS A SHARED VISIT WITH ROBIN ALBERT, PAC.  THE H&P AND EXAM WHERE DONE BY R. ALBERT, PAC.   Susan [name] is a 38 y.o. female with right shoulder pain.  Dg Shoulder Right  08/24/2013   CLINICAL DATA:  Right shoulder pain post fall  EXAM: RIGHT SHOULDER - 2+ VIEW  COMPARISON:  None.  FINDINGS: Three views of right shoulder submitted. No acute fracture or subluxation. No radiopaque foreign body.  IMPRESSION: Negative.   Electronically Signed   By: Natasha MeadLiviu  Pop M.D.   On: 08/24/2013 12:19    Will treat with NSAIDS and she is to follow up with Ortho if symptoms persist.  Discussed with the patient and all questioned fully answered.   Medication List    TAKE these medications       cyclobenzaprine 5 MG tablet  Commonly known as:  FLEXERIL  Take 1 tablet (5 mg total) by mouth 3 (three) times daily as needed for muscle spasms.     meloxicam 7.5 MG tablet  Commonly known as:  MOBIC  Take 1 tablet (7.5 mg total) by mouth daily.      ASK your doctor about these medications       FLUoxetine 40 MG capsule  Commonly known as:  PROZAC  Take 40 mg by mouth daily.     HYDROcodone-acetaminophen 5-325 MG per tablet  Commonly known as:  NORCO/VICODIN  Take 1 tablet by mouth every 6 (six) hours as needed for pain.     PROAIR HFA 108 (90 BASE) MCG/ACT inhaler  Generic drug:  albuterol  Inhale 2 puffs into the lungs every 6 (six) hours as needed for wheezing or shortness of breath.     traZODone 100 MG tablet  Commonly known as:  DESYREL  Take 100 mg by mouth at bedtime.         Susan [name], [place]NP 08/24/13 1806
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
## 2013-08-24 NOTE — Discharge Instructions (Signed)
Your x-ray shows no broken bone, no dislocation, no foreign body. Apply ice to the area, rest. Follow up with Dr. Hilda LiasKeeling if the pain continues.    Contusion A contusion is a deep bruise. Contusions happen when an injury causes bleeding under the skin. Signs of bruising include pain, puffiness (swelling), and discolored skin. The contusion may turn blue, purple, or yellow. HOME CARE   Put ice on the injured area.  Put ice in a plastic bag.  Place a towel between your skin and the bag.  Leave the ice on for 15-20 minutes, 03-04 times a day.  Only take medicine as told by your doctor.  Rest the injured area.  If possible, raise (elevate) the injured area to lessen puffiness. GET HELP RIGHT AWAY IF:   You have more bruising or puffiness.  You have pain that is getting worse.  Your puffiness or pain is not helped by medicine. MAKE SURE YOU:   Understand these instructions.  Will watch your condition.  Will get help right away if you are not doing well or get worse. Document Released: 01/12/2008 Document Revised: 10/18/2011 Document Reviewed: 05/31/2011 [facility]Exit[facility] Patient Information 2014 [place], [place].

## 2013-08-25 NOTE — ED Provider Notes (Signed)
Medical screening examination/treatment/procedure(s) were performed by non-physician practitioner and as supervising physician I was immediately available for consultation/collaboration.  EKG Interpretation   None        Terryl Molinelli F Jaden Batchelder, MD 08/25/13 1937 

## 2013-08-27 NOTE — ED Provider Notes (Signed)
Medical screening examination/treatment/procedure(s) were performed by non-physician practitioner and as supervising physician I was immediately available for consultation/collaboration.  EKG Interpretation   None        Niani Mourer F Clark Clowdus, MD 08/27/13 1821 

## 2013-10-25 ENCOUNTER — Emergency Department (HOSPITAL_COMMUNITY)
Admission: EM | Admit: 2013-10-25 | Discharge: 2013-10-25 | Disposition: A | Discharge status: Home / Self Care | Payer: Medicaid Other | Attending: Emergency Medicine | Admitting: Emergency Medicine

## 2013-10-25 ENCOUNTER — Encounter (HOSPITAL_COMMUNITY): Payer: Self-pay | Admitting: Emergency Medicine

## 2013-10-25 DIAGNOSIS — R300 Dysuria: Secondary | ICD-10-CM | POA: Insufficient documentation

## 2013-10-25 DIAGNOSIS — N898 Other specified noninflammatory disorders of vagina: Secondary | ICD-10-CM | POA: Insufficient documentation

## 2013-10-25 DIAGNOSIS — F17200 Nicotine dependence, unspecified, uncomplicated: Secondary | ICD-10-CM | POA: Insufficient documentation

## 2013-10-25 DIAGNOSIS — N730 Acute parametritis and pelvic cellulitis: Secondary | ICD-10-CM | POA: Insufficient documentation

## 2013-10-25 DIAGNOSIS — Z8659 Personal history of other mental and behavioral disorders: Secondary | ICD-10-CM | POA: Insufficient documentation

## 2013-10-25 DIAGNOSIS — Z87448 Personal history of other diseases of urinary system: Secondary | ICD-10-CM | POA: Insufficient documentation

## 2013-10-25 DIAGNOSIS — J45909 Unspecified asthma, uncomplicated: Secondary | ICD-10-CM | POA: Insufficient documentation

## 2013-10-25 DIAGNOSIS — Z3202 Encounter for pregnancy test, result negative: Secondary | ICD-10-CM | POA: Insufficient documentation

## 2013-10-25 LAB — CBC WITH DIFFERENTIAL/PLATELET
Basophils Absolute: 0 10*3/uL (ref 0.0–0.1)
Basophils Relative: 0 % (ref 0–1)
Eosinophils Absolute: 0.1 10*3/uL (ref 0.0–0.7)
Eosinophils Relative: 1 % (ref 0–5)
HCT: 40 % (ref 36.0–46.0)
Hemoglobin: 13.7 g/dL (ref 12.0–15.0)
Lymphocytes Relative: 29 % (ref 12–46)
Lymphs Abs: 2.8 10*3/uL (ref 0.7–4.0)
MCH: 33.2 pg (ref 26.0–34.0)
MCHC: 34.3 g/dL (ref 30.0–36.0)
MCV: 96.9 fL (ref 78.0–100.0)
Monocytes Absolute: 0.6 10*3/uL (ref 0.1–1.0)
Monocytes Relative: 6 % (ref 3–12)
Neutro Abs: 6.1 10*3/uL (ref 1.7–7.7)
Neutrophils Relative %: 64 % (ref 43–77)
Platelets: 233 10*3/uL (ref 150–400)
RBC: 4.13 MIL/uL (ref 3.87–5.11)
RDW: 13.2 % (ref 11.5–15.5)
WBC: 9.6 10*3/uL (ref 4.0–10.5)

## 2013-10-25 LAB — URINALYSIS, ROUTINE W REFLEX MICROSCOPIC
Bilirubin Urine: NEGATIVE
Glucose, UA: NEGATIVE mg/dL
Hgb urine dipstick: NEGATIVE
Ketones, ur: NEGATIVE mg/dL
Leukocytes, UA: NEGATIVE
Nitrite: NEGATIVE
Protein, ur: NEGATIVE mg/dL
Specific Gravity, Urine: >1.03 — ABNORMAL HIGH (ref 1.005–1.030)
Urobilinogen, UA: 0.2 mg/dL (ref 0.0–1.0)
pH: 5.5 (ref 5.0–8.0)

## 2013-10-25 LAB — BASIC METABOLIC PANEL
BUN: 12 mg/dL (ref 6–23)
CO2: 25 mEq/L (ref 19–32)
Calcium: 8.8 mg/dL (ref 8.4–10.5)
Chloride: 104 mEq/L (ref 96–112)
Creatinine, Ser: 0.59 mg/dL (ref 0.50–1.10)
GFR calc Af Amer: >90 mL/min (ref >90)
GFR calc non Af Amer: >90 mL/min (ref >90)
Glucose, Bld: 148 mg/dL — ABNORMAL HIGH (ref 70–99)
Potassium: 3.9 mEq/L (ref 3.7–5.3)
Sodium: 139 mEq/L (ref 137–147)

## 2013-10-25 LAB — WET PREP, GENITAL
Trich, Wet Prep: NONE SEEN
Yeast Wet Prep HPF POC: NONE SEEN

## 2013-10-25 LAB — PREGNANCY, URINE: Preg Test, Ur: NEGATIVE

## 2013-10-25 MED ORDER — HYDROCODONE-ACETAMINOPHEN 5-325 MG PO TABS
1.0000 | ORAL_TABLET | Freq: Once | ORAL | Status: AC
  Administered 2013-10-25: 1 via ORAL
  Filled 2013-10-25: qty 1

## 2013-10-25 MED ORDER — HYDROCODONE-ACETAMINOPHEN 5-325 MG PO TABS: 1.0000 | ORAL_TABLET | Freq: Four times a day (QID) | ORAL | Status: DC | PRN

## 2013-10-25 MED ORDER — AZITHROMYCIN 250 MG PO TABS
1000.0000 mg | ORAL_TABLET | Freq: Once | ORAL | Status: AC
  Administered 2013-10-25: 1000 mg via ORAL
  Filled 2013-10-25: qty 4

## 2013-10-25 MED ORDER — CEFTRIAXONE SODIUM 250 MG IJ SOLR
250.0000 mg | Freq: Once | INTRAMUSCULAR | Status: AC
  Administered 2013-10-25: 250 mg via INTRAMUSCULAR
  Filled 2013-10-25: qty 250

## 2013-10-25 MED ORDER — DOXYCYCLINE HYCLATE 100 MG PO CAPS: 100.0000 mg | ORAL_CAPSULE | Freq: Two times a day (BID) | ORAL | Status: DC

## 2013-10-25 NOTE — Discharge Instructions (Signed)
Pelvic Inflammatory Disease Pelvic inflammatory disease (PID) is an infection in some or all of the female organs. PID can be in the uterus, ovaries, fallopian tubes, or the surrounding tissues inside the lower belly area (pelvis). HOME CARE   If given, take your antibiotic medicine as told. Finish them even if you start to feel better.  Only take medicine as told by your doctor.  Do not have sex (intercourse) until treatment is done or as told by your doctor.  Tell your sex partner if you have PID. Your partner may need to be treated.  Keep all doctor visits. GET HELP RIGHT AWAY IF:   You have a fever.  You have more belly (abdominal) or lower belly pain.  You have chills.  You have pain when you pee (urinate).  You are not better after 72 hours.  You have more fluid (discharge) coming from your vagina or fluid that is not normal.  You need pain medicine from your doctor.  You throw up (vomit).  You cannot take your medicines.  Your partner has a sexually transmitted disease (STD). MAKE SURE YOU:   Understand these instructions.  Will watch your condition.  Will get help right away if you are not doing well or get worse. Document Released: 10/22/2008 Document Revised: 11/20/2012 Document Reviewed: 07/22/2011 [facility] ([place])ExitCare Patient Information 2014 [place], [place].  Take antibiotic as directed. Return if not improving in 2 days. Return for any newer worse symptoms. Take pain medicine as needed.

## 2013-10-25 NOTE — ED Notes (Addendum)
Pelvic Exam prep at bedside.EDP aware.

## 2013-10-25 NOTE — ED Notes (Signed)
Low abd pain, no N/V, White vag d/c.  Alert,   No fever

## 2013-10-25 NOTE — ED Provider Notes (Signed)
CSN: [id number]     Arrival date & time 10/25/13  1424 History   This chart was scribed for Susan Jakes, MD by Tana Conch, ED Scribe. This patient was seen in room APA07/APA07 and the patient's care was started at 4:49 PM.    Chief Complaint  Patient presents with  . Abdominal Pain      Patient is a 38 y.o. female presenting with vaginal discharge. The history is provided by the patient. No language interpreter was used.  Vaginal Discharge Quality:  White and milky Severity:  Mild Onset quality:  Gradual Duration:  1 week Timing:  Rare Progression:  Unchanged Chronicity:  New Context: spontaneously   Relieved by:  Nothing Worsened by:  Nothing tried Ineffective treatments:  None tried Associated symptoms: abdominal pain and dysuria   Associated symptoms: no fever, no nausea and no vomiting     HPI Comments: Susan [name] is a 38 y.o. female who presents to the Emergency Department complaining of white, milky vaginal discharge that began around a week ago. Pt reports associated odor to the discharge. Pt reports associated lower abdominal pain 8/10 that is worsened with activity. LNMP 3 weeks ago   Past Medical History  Diagnosis Date  . Ectopic pregnancy   . Asthma   . Mental disorder     panic attacks  . Trichomonas vaginitis   . BV (bacterial vaginosis) 03/06/2013   Past Surgical History  Procedure Laterality Date  . Ectopic pregnancy surgery     Family History  Problem Relation Age of Onset  . COPD Mother    History  Substance Use Topics  . Smoking status: Current Every Day Smoker -- 1.00 packs/day    Types: Cigarettes  . Smokeless tobacco: Never Used  . Alcohol Use: No   OB History   Grav Para Term Preterm Abortions TAB SAB Ect Mult Living   6 3 3  3 2  1        Review of Systems  Constitutional: Negative for fever.  HENT: Negative for congestion, rhinorrhea, sneezing and sore throat.   Eyes: Negative for visual disturbance.   Cardiovascular: Negative for chest pain and leg swelling.  Gastrointestinal: Positive for abdominal pain. Negative for nausea, vomiting and diarrhea.  Genitourinary: Positive for dysuria and vaginal discharge.  Musculoskeletal: Positive for back pain.  Skin: Negative for rash.  Neurological: Positive for headaches.  Hematological: Does not bruise/bleed easily.  Psychiatric/Behavioral: Negative for confusion.      Allergies  Review of patient's allergies indicates no known allergies.  Home Medications   Current Outpatient Rx  Name  Route  Sig  Dispense  Refill  . doxycycline (VIBRAMYCIN) 100 MG capsule   Oral   Take 1 capsule (100 mg total) by mouth 2 (two) times daily.   28 capsule   0   . HYDROcodone-acetaminophen (NORCO/VICODIN) 5-325 MG per tablet   Oral   Take 1-2 tablets by mouth every 6 (six) hours as needed for moderate pain.   14 tablet   0    BP 110/68  Pulse 85  Temp(Src) 98.3 F (36.8 C) (Oral)  Resp 18  Ht 5\' 1"  (1.549 m)  Wt 142 lb (64.411 kg)  BMI 26.84 kg/m2  SpO2 100%  LMP 10/11/2013 Physical Exam  Constitutional: She is oriented to person, place, and time.  Cardiovascular: Normal rate, regular rhythm and normal heart sounds.   Pulmonary/Chest: Effort normal and breath sounds normal.  Abdominal: Bowel sounds are normal. There is tenderness (  lower abdomen).  Genitourinary: Vaginal discharge found.  External genitalia is normal. Purulent discharge in the vaginal area. Mild cervical motion tenderness. Mild uterine tenderness. Mild bilateral adnexal tenderness.  Neurological: She is alert and oriented to person, place, and time. No cranial nerve deficit. She exhibits normal muscle tone. Coordination normal.    ED Course  Procedures (including critical care time)  DIAGNOSTIC STUDIES: Oxygen Saturation is 100% on RA, normal by my interpretation.    COORDINATION OF CARE:   4:50 PM-Discussed treatment plan which includes UA  with pt at bedside  and pt agreed to plan.  Medications  HYDROcodone-acetaminophen (NORCO/VICODIN) 5-325 MG per tablet 1 tablet (not administered)  cefTRIAXone (ROCEPHIN) injection 250 mg (250 mg Intramuscular Given 10/25/13 1818)  azithromycin (ZITHROMAX) tablet 1,000 mg (1,000 mg Oral Given 10/25/13 1818)    Results for orders placed during the hospital encounter of 10/25/13  WET PREP, GENITAL      Result Value Ref Range   Yeast Wet Prep HPF POC NONE SEEN  NONE SEEN   Trich, Wet Prep NONE SEEN  NONE SEEN   Clue Cells Wet Prep HPF POC FEW (*) NONE SEEN   WBC, Wet Prep HPF POC MODERATE (*) NONE SEEN  URINALYSIS, ROUTINE W REFLEX MICROSCOPIC      Result Value Ref Range   Color, Urine YELLOW  YELLOW   APPearance CLEAR  CLEAR   Specific Gravity, Urine >1.030 (*) 1.005 - 1.030   pH 5.5  5.0 - 8.0   Glucose, UA NEGATIVE  NEGATIVE mg/dL   Hgb urine dipstick NEGATIVE  NEGATIVE   Bilirubin Urine NEGATIVE  NEGATIVE   Ketones, ur NEGATIVE  NEGATIVE mg/dL   Protein, ur NEGATIVE  NEGATIVE mg/dL   Urobilinogen, UA 0.2  0.0 - 1.0 mg/dL   Nitrite NEGATIVE  NEGATIVE   Leukocytes, UA NEGATIVE  NEGATIVE  PREGNANCY, URINE      Result Value Ref Range   Preg Test, Ur NEGATIVE  NEGATIVE  CBC WITH DIFFERENTIAL      Result Value Ref Range   WBC 9.6  4.0 - 10.5 K/uL   RBC 4.13  3.87 - 5.11 MIL/uL   Hemoglobin 13.7  12.0 - 15.0 g/dL   HCT 16.140.0  09.636.0 - 04.546.0 %   MCV 96.9  78.0 - 100.0 fL   MCH 33.2  26.0 - 34.0 pg   MCHC 34.3  30.0 - 36.0 g/dL   RDW 40.913.2  81.111.5 - 91.415.5 %   Platelets 233  150 - 400 K/uL   Neutrophils Relative % 64  43 - 77 %   Neutro Abs 6.1  1.7 - 7.7 K/uL   Lymphocytes Relative 29  12 - 46 %   Lymphs Abs 2.8  0.7 - 4.0 K/uL   Monocytes Relative 6  3 - 12 %   Monocytes Absolute 0.6  0.1 - 1.0 K/uL   Eosinophils Relative 1  0 - 5 %   Eosinophils Absolute 0.1  0.0 - 0.7 K/uL   Basophils Relative 0  0 - 1 %   Basophils Absolute 0.0  0.0 - 0.1 K/uL  BASIC METABOLIC PANEL      Result Value Ref Range    Sodium 139  137 - 147 mEq/L   Potassium 3.9  3.7 - 5.3 mEq/L   Chloride 104  96 - 112 mEq/L   CO2 25  19 - 32 mEq/L   Glucose, Bld 148 (*) 70 - 99 mg/dL   BUN 12  6 -  23 mg/dL   Creatinine, Ser 1.61  0.50 - 1.10 mg/dL   Calcium 8.8  8.4 - 09.6 mg/dL   GFR calc non Af Amer >90  >90 mL/min   GFR calc Af Amer >90  >90 mL/min       Labs Review Labs Reviewed  WET PREP, GENITAL - Abnormal; Notable for the following:    Clue Cells Wet Prep HPF POC FEW (*)    WBC, Wet Prep HPF POC MODERATE (*)    All other components within normal limits  URINALYSIS, ROUTINE W REFLEX MICROSCOPIC - Abnormal; Notable for the following:    Specific Gravity, Urine >1.030 (*)    All other components within normal limits  BASIC METABOLIC PANEL - Abnormal; Notable for the following:    Glucose, Bld 148 (*)    All other components within normal limits  GC/CHLAMYDIA PROBE AMP  PREGNANCY, URINE  CBC WITH DIFFERENTIAL  HIV ANTIBODY (ROUTINE TESTING)   Imaging Review No results found.   EKG Interpretation None      MDM   Final diagnoses:  PID (acute pelvic inflammatory disease)   I personally performed the services described in this documentation, which was scribed in my presence. The recorded information has been reviewed and is accurate.    Pelvic cultures are pending. HIV test screen is pending. Patient clinically symptoms are consistent with PID. Patient received Rocephin here 1 g of azithromycin. Patient discharged home on doxycycline. His pain medication.    Susan Jakes, MD 10/25/13 [phone]

## 2013-10-26 LAB — GC/CHLAMYDIA PROBE AMP
CT Probe RNA: NEGATIVE
GC Probe RNA: NEGATIVE

## 2013-10-26 LAB — HIV ANTIBODY (ROUTINE TESTING W REFLEX): HIV: NONREACTIVE

## 2014-02-27 ENCOUNTER — Emergency Department (HOSPITAL_COMMUNITY)
Admission: EM | Admit: 2014-02-27 | Discharge: 2014-02-27 | Disposition: A | Discharge status: Home / Self Care | Payer: Self-pay | Attending: Emergency Medicine | Admitting: Emergency Medicine

## 2014-02-27 ENCOUNTER — Emergency Department (HOSPITAL_COMMUNITY): Payer: Self-pay

## 2014-02-27 ENCOUNTER — Encounter (HOSPITAL_COMMUNITY): Payer: Self-pay | Admitting: Emergency Medicine

## 2014-02-27 DIAGNOSIS — S300XXA Contusion of lower back and pelvis, initial encounter: Secondary | ICD-10-CM

## 2014-02-27 DIAGNOSIS — Y9389 Activity, other specified: Secondary | ICD-10-CM | POA: Insufficient documentation

## 2014-02-27 DIAGNOSIS — S20229A Contusion of unspecified back wall of thorax, initial encounter: Secondary | ICD-10-CM | POA: Insufficient documentation

## 2014-02-27 DIAGNOSIS — Y929 Unspecified place or not applicable: Secondary | ICD-10-CM | POA: Insufficient documentation

## 2014-02-27 DIAGNOSIS — J45909 Unspecified asthma, uncomplicated: Secondary | ICD-10-CM | POA: Insufficient documentation

## 2014-02-27 DIAGNOSIS — Z8742 Personal history of other diseases of the female genital tract: Secondary | ICD-10-CM | POA: Insufficient documentation

## 2014-02-27 DIAGNOSIS — Z8619 Personal history of other infectious and parasitic diseases: Secondary | ICD-10-CM | POA: Insufficient documentation

## 2014-02-27 DIAGNOSIS — R296 Repeated falls: Secondary | ICD-10-CM | POA: Insufficient documentation

## 2014-02-27 DIAGNOSIS — F17200 Nicotine dependence, unspecified, uncomplicated: Secondary | ICD-10-CM | POA: Insufficient documentation

## 2014-02-27 DIAGNOSIS — Z79899 Other long term (current) drug therapy: Secondary | ICD-10-CM | POA: Insufficient documentation

## 2014-02-27 MED ORDER — NAPROXEN 500 MG PO TABS: 500.0000 mg | ORAL_TABLET | Freq: Two times a day (BID) | ORAL | Status: DC

## 2014-02-27 MED ORDER — OXYCODONE-ACETAMINOPHEN 5-325 MG PO TABS: 1.0000 | ORAL_TABLET | ORAL | Status: DC | PRN

## 2014-02-27 MED ORDER — OXYCODONE-ACETAMINOPHEN 5-325 MG PO TABS
1.0000 | ORAL_TABLET | Freq: Once | ORAL | Status: AC
  Administered 2014-02-27: 1 via ORAL
  Filled 2014-02-27: qty 1

## 2014-02-27 NOTE — ED Notes (Signed)
Pt c/o right lower back pain x2 days. Pt states she fell and landed on right side.
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
## 2014-02-27 NOTE — Discharge Instructions (Signed)
Contusion °A contusion is a deep bruise. Contusions are the result of an injury that caused bleeding under the skin. The contusion may turn blue, purple, or yellow. Minor injuries will give you a painless contusion, but more severe contusions may stay painful and swollen for a few weeks.  °CAUSES  °A contusion is usually caused by a blow, trauma, or direct force to an area of the body. °SYMPTOMS  °· Swelling and redness of the injured area. °· Bruising of the injured area. °· Tenderness and soreness of the injured area. °· Pain. °DIAGNOSIS  °The diagnosis can be made by taking a history and physical exam. An X-ray, CT scan, or MRI may be needed to determine if there were any associated injuries, such as fractures. °TREATMENT  °Specific treatment will depend on what area of the body was injured. In general, the best treatment for a contusion is resting, icing, elevating, and applying cold compresses to the injured area. Over-the-counter medicines may also be recommended for pain control. Ask your caregiver what the best treatment is for your contusion. °HOME CARE INSTRUCTIONS  °· Put ice on the injured area. °¨ Put ice in a plastic bag. °¨ Place a towel between your skin and the bag. °¨ Leave the ice on for 15-20 minutes, 3-4 times a day, or as directed by your health care provider. °· Only take over-the-counter or prescription medicines for pain, discomfort, or fever as directed by your caregiver. Your caregiver may recommend avoiding anti-inflammatory medicines (aspirin, ibuprofen, and naproxen) for 48 hours because these medicines may increase bruising. °· Rest the injured area. °· If possible, elevate the injured area to reduce swelling. °SEEK IMMEDIATE MEDICAL CARE IF:  °· You have increased bruising or swelling. °· You have pain that is getting worse. °· Your swelling or pain is not relieved with medicines. °MAKE SURE YOU:  °· Understand these instructions. °· Will watch your condition. °· Will get help right  away if you are not doing well or get worse. °Document Released: 05/05/2005 Document Revised: 07/31/2013 Document Reviewed: 05/31/2011 °ExitCare® Patient Information ©2015 [employer]. This information is not intended to replace advice given to you by your health care provider. Make sure you discuss any questions you have with your health care provider. ° °

## 2014-03-02 NOTE — ED Provider Notes (Signed)
Medical screening examination/treatment/procedure(s) were performed by non-physician practitioner and as supervising physician I was immediately available for consultation/collaboration.   EKG Interpretation None       Altin Sease, MD 03/02/14 0702 

## 2014-03-25 ENCOUNTER — Encounter (HOSPITAL_COMMUNITY): Payer: Self-pay | Admitting: Emergency Medicine

## 2014-03-25 ENCOUNTER — Emergency Department (HOSPITAL_COMMUNITY)
Admission: EM | Admit: 2014-03-25 | Discharge: 2014-03-25 | Disposition: A | Discharge status: Home / Self Care | Payer: Self-pay | Attending: Emergency Medicine | Admitting: Emergency Medicine

## 2014-03-25 DIAGNOSIS — Z8619 Personal history of other infectious and parasitic diseases: Secondary | ICD-10-CM | POA: Insufficient documentation

## 2014-03-25 DIAGNOSIS — J45909 Unspecified asthma, uncomplicated: Secondary | ICD-10-CM | POA: Insufficient documentation

## 2014-03-25 DIAGNOSIS — F17200 Nicotine dependence, unspecified, uncomplicated: Secondary | ICD-10-CM | POA: Insufficient documentation

## 2014-03-25 DIAGNOSIS — Z79899 Other long term (current) drug therapy: Secondary | ICD-10-CM | POA: Insufficient documentation

## 2014-03-25 DIAGNOSIS — K029 Dental caries, unspecified: Secondary | ICD-10-CM | POA: Insufficient documentation

## 2014-03-25 DIAGNOSIS — F410 Panic disorder [episodic paroxysmal anxiety] without agoraphobia: Secondary | ICD-10-CM | POA: Insufficient documentation

## 2014-03-25 DIAGNOSIS — K0889 Other specified disorders of teeth and supporting structures: Secondary | ICD-10-CM

## 2014-03-25 DIAGNOSIS — K089 Disorder of teeth and supporting structures, unspecified: Secondary | ICD-10-CM | POA: Insufficient documentation

## 2014-03-25 DIAGNOSIS — Z8742 Personal history of other diseases of the female genital tract: Secondary | ICD-10-CM | POA: Insufficient documentation

## 2014-03-25 MED ORDER — OXYCODONE-ACETAMINOPHEN 5-325 MG PO TABS
1.0000 | ORAL_TABLET | Freq: Once | ORAL | Status: AC
  Administered 2014-03-25: 1 via ORAL
  Filled 2014-03-25: qty 1

## 2014-03-25 MED ORDER — AMOXICILLIN 250 MG PO CAPS
500.0000 mg | ORAL_CAPSULE | Freq: Once | ORAL | Status: AC
  Administered 2014-03-25: 500 mg via ORAL
  Filled 2014-03-25: qty 2

## 2014-03-25 MED ORDER — AMOXICILLIN 500 MG PO CAPS: 500.0000 mg | ORAL_CAPSULE | Freq: Three times a day (TID) | ORAL | Status: DC

## 2014-03-25 MED ORDER — DICLOFENAC SODIUM 75 MG PO TBEC: 75.0000 mg | DELAYED_RELEASE_TABLET | Freq: Two times a day (BID) | ORAL | Status: DC

## 2014-03-25 NOTE — Discharge Instructions (Signed)

## 2014-03-25 NOTE — ED Provider Notes (Signed)
CSN: [id number]     Arrival date & time 03/25/14  1017 History   First MD Initiated Contact with Patient 03/25/14 1104     Chief Complaint  Patient presents with  . Dental Pain     (Consider location/radiation/quality/duration/timing/severity/associated sxs/prior Treatment) The history is provided by the patient.  Susan [name] is a 38 y.o. female who presents to the Emergency Department complaining of dental pain for 2 days.  She states she began noticing pain to several teeth and the left side of her face was painful with pain radiating to her ear and worse with chewing.  She tried OTC medications w/o relief.  She denies fever, chills, facial swelling, difficulty swallowing or breathing.    Past Medical History  Diagnosis Date  . Ectopic pregnancy   . Asthma   . Mental disorder     panic attacks  . Trichomonas vaginitis   . BV (bacterial vaginosis) 03/06/2013   Past Surgical History  Procedure Laterality Date  . Ectopic pregnancy surgery     Family History  Problem Relation Age of Onset  . COPD Mother    History  Substance Use Topics  . Smoking status: Current Every Day Smoker -- 1.00 packs/day    Types: Cigarettes  . Smokeless tobacco: Never Used  . Alcohol Use: No   OB History   Grav Para Term Preterm Abortions TAB SAB Ect Mult Living   6 3 3  3 2  1        Review of Systems  Constitutional: Negative for fever and appetite change.  HENT: Positive for dental problem. Negative for congestion, facial swelling, sore throat and trouble swallowing.   Eyes: Negative for pain and visual disturbance.  Musculoskeletal: Negative for neck pain and neck stiffness.  Neurological: Negative for dizziness, facial asymmetry and headaches.  Hematological: Negative for adenopathy.  All other systems reviewed and are negative.     Allergies  Review of patient's allergies indicates no known allergies.  Home Medications   Prior to Admission medications   Medication Sig Start  Date End Date Taking? Authorizing Provider  FLUoxetine (PROZAC) 20 MG capsule Take 60 mg by mouth daily.   Yes Historical Provider, MD  HYDROcodone-acetaminophen (NORCO/VICODIN) 5-325 MG per tablet Take 1 tablet by mouth every 4 (four) hours as needed for moderate pain.   Yes Historical Provider, MD  traZODone (DESYREL) 100 MG tablet Take 100 mg by mouth at bedtime.   Yes Historical Provider, MD   BP 117/77  Pulse 64  Temp(Src) 99 F (37.2 C) (Oral)  Resp 16  Ht 5\' 1"  (1.549 m)  Wt 145 lb (65.772 kg)  BMI 27.41 kg/m2  SpO2 100%  LMP 03/18/2014 Physical Exam  Nursing note and vitals reviewed. Constitutional: She is oriented to person, place, and time. She appears well-developed and well-nourished. No distress.  HENT:  Head: Normocephalic and atraumatic.  Right Ear: Tympanic membrane and ear canal normal.  Left Ear: Tympanic membrane and ear canal normal.  Mouth/Throat: Uvula is midline, oropharynx is clear and moist and mucous membranes are normal. No trismus in the jaw. Dental caries present. No dental abscesses or uvula swelling.    Dental caries and ttp of the left lateral incisor and lower molars.  No facial swelling, obvious dental abscess, trismus, or sublingual abnml.    Neck: Normal range of motion. Neck supple.  Cardiovascular: Normal rate, regular rhythm and normal heart sounds.   No murmur heard. Pulmonary/Chest: Effort normal and breath sounds normal. No  respiratory distress.  Musculoskeletal: Normal range of motion.  Lymphadenopathy:    She has no cervical adenopathy.  Neurological: She is alert and oriented to person, place, and time. She exhibits normal muscle tone. Coordination normal.  Skin: Skin is warm and dry.    ED Course  Procedures (including critical care time) Labs Review Labs Reviewed - No data to display  Imaging Review No results found.   EKG Interpretation None      MDM   Final diagnoses:  Pain, dental   VSS.  Pt is well appearing,   No concerning sx's for infection to the floor of the mouth or deep structures of the neck.  Referral info given, along with rx's for amoxil and diclofenac  Pt appears stable for d/c   Kyiah Canepa L. Trisha Mangle, PA-C 03/26/14 8119

## 2014-03-25 NOTE — ED Notes (Signed)
Pt reports dental pain that causes left side pain in her face and head along with trouble sleeping due to pain.

## 2014-03-26 NOTE — ED Provider Notes (Signed)
Medical screening examination/treatment/procedure(s) were performed by non-physician practitioner and as supervising physician I was immediately available for consultation/collaboration.   EKG Interpretation None        Annelle Behrendt F Loreta Blouch, MD 03/26/14 1117 

## 2014-06-10 ENCOUNTER — Encounter (HOSPITAL_COMMUNITY): Payer: Self-pay | Admitting: Emergency Medicine

## 2014-06-21 ENCOUNTER — Encounter (HOSPITAL_COMMUNITY): Payer: Self-pay | Admitting: Emergency Medicine

## 2014-06-21 ENCOUNTER — Emergency Department (HOSPITAL_COMMUNITY)
Admission: EM | Admit: 2014-06-21 | Discharge: 2014-06-21 | Disposition: A | Discharge status: Home / Self Care | Payer: Self-pay | Attending: Emergency Medicine | Admitting: Emergency Medicine

## 2014-06-21 DIAGNOSIS — Z79899 Other long term (current) drug therapy: Secondary | ICD-10-CM | POA: Insufficient documentation

## 2014-06-21 DIAGNOSIS — S025XXA Fracture of tooth (traumatic), initial encounter for closed fracture: Secondary | ICD-10-CM

## 2014-06-21 DIAGNOSIS — Z791 Long term (current) use of non-steroidal anti-inflammatories (NSAID): Secondary | ICD-10-CM | POA: Insufficient documentation

## 2014-06-21 DIAGNOSIS — A5901 Trichomonal vulvovaginitis: Secondary | ICD-10-CM

## 2014-06-21 DIAGNOSIS — J45909 Unspecified asthma, uncomplicated: Secondary | ICD-10-CM | POA: Insufficient documentation

## 2014-06-21 DIAGNOSIS — K088 Other specified disorders of teeth and supporting structures: Secondary | ICD-10-CM | POA: Insufficient documentation

## 2014-06-21 DIAGNOSIS — K0889 Other specified disorders of teeth and supporting structures: Secondary | ICD-10-CM

## 2014-06-21 DIAGNOSIS — Z792 Long term (current) use of antibiotics: Secondary | ICD-10-CM | POA: Insufficient documentation

## 2014-06-21 DIAGNOSIS — Z720 Tobacco use: Secondary | ICD-10-CM | POA: Insufficient documentation

## 2014-06-21 DIAGNOSIS — K0381 Cracked tooth: Secondary | ICD-10-CM | POA: Insufficient documentation

## 2014-06-21 DIAGNOSIS — Z3202 Encounter for pregnancy test, result negative: Secondary | ICD-10-CM | POA: Insufficient documentation

## 2014-06-21 DIAGNOSIS — F410 Panic disorder [episodic paroxysmal anxiety] without agoraphobia: Secondary | ICD-10-CM | POA: Insufficient documentation

## 2014-06-21 LAB — WET PREP, GENITAL: Yeast Wet Prep HPF POC: NONE SEEN

## 2014-06-21 LAB — URINALYSIS, ROUTINE W REFLEX MICROSCOPIC
Bilirubin Urine: NEGATIVE
Glucose, UA: NEGATIVE mg/dL
Hgb urine dipstick: NEGATIVE
Ketones, ur: NEGATIVE mg/dL
Leukocytes, UA: NEGATIVE
Nitrite: NEGATIVE
Protein, ur: NEGATIVE mg/dL
Specific Gravity, Urine: 1.015 (ref 1.005–1.030)
Urobilinogen, UA: 0.2 mg/dL (ref 0.0–1.0)
pH: 6.5 (ref 5.0–8.0)

## 2014-06-21 LAB — PREGNANCY, URINE: Preg Test, Ur: NEGATIVE

## 2014-06-21 MED ORDER — METRONIDAZOLE 500 MG PO TABS: 500.0000 mg | ORAL_TABLET | Freq: Two times a day (BID) | ORAL | Status: DC

## 2014-06-21 MED ORDER — OXYCODONE-ACETAMINOPHEN 5-325 MG PO TABS: 1.0000 | ORAL_TABLET | ORAL | Status: DC | PRN

## 2014-06-21 MED ORDER — AZITHROMYCIN 250 MG PO TABS
1000.0000 mg | ORAL_TABLET | Freq: Once | ORAL | Status: AC
  Administered 2014-06-21: 1000 mg via ORAL
  Filled 2014-06-21: qty 4

## 2014-06-21 MED ORDER — OXYCODONE-ACETAMINOPHEN 5-325 MG PO TABS
1.0000 | ORAL_TABLET | Freq: Once | ORAL | Status: AC
  Administered 2014-06-21: 1 via ORAL
  Filled 2014-06-21: qty 1

## 2014-06-21 MED ORDER — CEFTRIAXONE SODIUM 250 MG IJ SOLR
250.0000 mg | Freq: Once | INTRAMUSCULAR | Status: AC
  Administered 2014-06-21: 250 mg via INTRAMUSCULAR
  Filled 2014-06-21: qty 250

## 2014-06-21 MED ORDER — HYDROCODONE-ACETAMINOPHEN 5-325 MG PO TABS: 1.0000 | ORAL_TABLET | Freq: Once | ORAL | Status: DC

## 2014-06-21 NOTE — ED Notes (Signed)
Pt reports dental pain and vaginal "odor", urinary frequency, "cloudy" urine for last several days. Pt denies any known fevers, n/v/d.

## 2014-06-21 NOTE — ED Notes (Signed)
Patient with no complaints at this time. Respirations even and unlabored. Skin warm/dry. Discharge instructions reviewed with patient at this time. Patient given opportunity to voice concerns/ask questions. Patient discharged at this time and left Emergency Department with steady gait.   

## 2014-06-21 NOTE — ED Provider Notes (Signed)
CSN: [id number]     Arrival date & time 06/21/14  1059 History   First MD Initiated Contact with Patient 06/21/14 1131     Chief Complaint  Patient presents with  . Dental Pain     (Consider location/radiation/quality/duration/timing/severity/associated sxs/prior Treatment) The history is provided by the patient.   Susan [name] is a 38 y.o. female with complaint of vaginal discharge and increased odor for the past several days.  She also has complaint of cloudy urine but denies dysuria, increased frequency or hematuria.  She denies abdominal or pelvic pain, fevers, nausea or vomiting.  She states her newly ex long term partner was caught cheating on her. She states her partner has no complaints of penile dc.  She does not use condoms.  Additionally, describes chronic dental pain at the site of a broken tooth.  She does not have a dentist. She has no complaint of difficulty swallowing, although chewing makes pain worse.  The patient has tried tylenol without relief of symptoms.         Past Medical History  Diagnosis Date  . Ectopic pregnancy   . Asthma   . Mental disorder     panic attacks  . Trichomonas vaginitis   . BV (bacterial vaginosis) 03/06/2013   Past Surgical History  Procedure Laterality Date  . Ectopic pregnancy surgery     Family History  Problem Relation Age of Onset  . COPD Mother    History  Substance Use Topics  . Smoking status: Current Every Day Smoker -- 1.00 packs/day    Types: Cigarettes  . Smokeless tobacco: Never Used  . Alcohol Use: No   OB History    Gravida Para Term Preterm AB TAB SAB Ectopic Multiple Living   6 3 3  3 2  1        Review of Systems  Constitutional: Negative for fever.  HENT: Positive for dental problem. Negative for facial swelling and sore throat.   Respiratory: Negative for shortness of breath.   Gastrointestinal: Negative for nausea, vomiting and abdominal pain.  Genitourinary: Positive for vaginal discharge.  Negative for dysuria, frequency, hematuria, vaginal pain and pelvic pain.  Musculoskeletal: Negative for neck pain and neck stiffness.      Allergies  Review of patient's allergies indicates no known allergies.  Home Medications   Prior to Admission medications   Medication Sig Start Date End Date Taking? Authorizing Provider  acetaminophen (TYLENOL) 500 MG tablet Take 1,000 mg by mouth every 6 (six) hours as needed for moderate pain.   Yes Historical Provider, MD  HYDROcodone-acetaminophen (NORCO/VICODIN) 5-325 MG per tablet Take 1 tablet by mouth every 4 (four) hours as needed for moderate pain.   Yes Historical Provider, MD  ibuprofen (ADVIL,MOTRIN) 200 MG tablet Take 200 mg by mouth every 6 (six) hours as needed for moderate pain.   Yes Historical Provider, MD  amoxicillin (AMOXIL) 500 MG capsule Take 1 capsule (500 mg total) by mouth 3 (three) times daily. For 10 days Patient not taking: Reported on 06/21/2014 03/25/14   Tammy L. Triplett, PA-C  diclofenac (VOLTAREN) 75 MG EC tablet Take 1 tablet (75 mg total) by mouth 2 (two) times daily. Take with food Patient not taking: Reported on 06/21/2014 03/25/14   Tammy L. Triplett, PA-C  FLUoxetine (PROZAC) 20 MG capsule Take 60 mg by mouth daily.    Historical Provider, MD  metroNIDAZOLE (FLAGYL) 500 MG tablet Take 1 tablet (500 mg total) by mouth 2 (two) times daily.  06/21/14   Burgess AmorJulie Adhya Cocco, PA-C  oxyCODONE-acetaminophen (PERCOCET/ROXICET) 5-325 MG per tablet Take 1 tablet by mouth every 4 (four) hours as needed. 06/21/14   Burgess AmorJulie Bettyann Birchler, PA-C  traZODone (DESYREL) 100 MG tablet Take 100 mg by mouth at bedtime.    Historical Provider, MD   BP 116/70 mmHg  Pulse 71  Temp(Src) 98.3 F (36.8 C) (Oral)  Resp 18  Ht 5\' 1"  (1.549 m)  Wt 145 lb (65.772 kg)  BMI 27.41 kg/m2  SpO2 100%  LMP 05/21/2014 Physical Exam  Constitutional: She appears well-developed and well-nourished.  HENT:  Head: Normocephalic and atraumatic.  Mouth/Throat: No  trismus in the jaw.    Generalized poor dentition, no abscess. No gingival induration. Fracture left upper lateral incisor.  Eyes: Conjunctivae are normal.  Neck: Normal range of motion.  Cardiovascular: Normal rate, regular rhythm, normal heart sounds and intact distal pulses.   Pulmonary/Chest: Effort normal and breath sounds normal. She has no wheezes.  Abdominal: Soft. Bowel sounds are normal. There is no tenderness.  Genitourinary: Uterus normal. Cervix exhibits no motion tenderness and no discharge. Right adnexum displays no mass, no tenderness and no fullness. Left adnexum displays no mass, no tenderness and no fullness. No erythema or tenderness in the vagina. Vaginal discharge found.  Musculoskeletal: Normal range of motion.  Neurological: She is alert.  Skin: Skin is warm and dry.  Psychiatric: She has a normal mood and affect.  Nursing note and vitals reviewed.   ED Course  Procedures (including critical care time) Labs Review Labs Reviewed  WET PREP, GENITAL - Abnormal; Notable for the following:    Trich, Wet Prep MODERATE (*)    Clue Cells Wet Prep HPF POC FEW (*)    WBC, Wet Prep HPF POC FEW (*)    All other components within normal limits  GC/CHLAMYDIA PROBE AMP  URINALYSIS, ROUTINE W REFLEX MICROSCOPIC  PREGNANCY, URINE  RPR  HIV ANTIBODY (ROUTINE TESTING)    Imaging Review No results found.   EKG Interpretation None      MDM   Final diagnoses:  Trichomonas vaginitis  Pain, dental  Broken tooth, closed, initial encounter    Patients labs and/or radiological studies were viewed and considered during the medical decision making and disposition process.  Advised f/u with [facility] if sx persist. Prescribed flagyl.  Dental referrals given.  Pt advised cultures pending.  She opted for tx for gonorrhea/zithromax which was given.      Burgess AmorJulie Dona Klemann, PA-C 06/23/14 1739  Layla MawKristen N Ward, DO 06/26/14 [phone]

## 2014-06-21 NOTE — Discharge Instructions (Signed)
Dental Fracture You have a dental fracture or injury. This can mean the tooth is loose, has a chip in the enamel or is broken. If just the outer enamel is chipped, there is a good chance the tooth will not become infected. The only treatment needed may be to smooth off a rough edge. Fractures into the deeper layers (dentin and pulp) cause greater pain and are more likely to become infected. These require you to see a dentist as soon as possible to save the tooth. Loose teeth may need to be wired or bonded with a plastic splint to hold them in place. A paste may be painted on the open area of the broken tooth to reduce the pain. Antibiotics and pain medicine may be prescribed. Choosing a soft or liquid diet and rinsing the mouth out with warm water after meals may be helpful. See your dentist as recommended. Failure to seek care or follow up with a dentist or other specialist as recommended could result in the loss of your tooth, infection, or permanent dental problems. SEEK MEDICAL CARE IF:   You have increased pain not controlled with medicines.  You have swelling around the tooth, in the face or neck.  You have bleeding which starts, continues, or gets worse.  You have a fever. Document Released: 09/02/2004 Document Revised: 10/18/2011 Document Reviewed: 06/17/2009 [facility] Patient Information 2015 [place], [place]. This information is not intended to replace advice given to you by your health care provider. Make sure you discuss any questions you have with your health care provider.  Trichomoniasis Trichomoniasis is an infection caused by an organism called Trichomonas. The infection can affect both women and men. In women, the outer female genitalia and the vagina are affected. In men, the penis is mainly affected, but the prostate and other reproductive organs can also be involved. Trichomoniasis is a sexually transmitted infection (STI) and is most often passed to another person through sexual  contact.  RISK FACTORS  Having unprotected sexual intercourse.  Having sexual intercourse with an infected partner. SIGNS AND SYMPTOMS  Symptoms of trichomoniasis in women include:  Abnormal gray-green frothy vaginal discharge.  Itching and irritation of the vagina.  Itching and irritation of the area outside the vagina. Symptoms of trichomoniasis in men include:   Penile discharge with or without pain.  Pain during urination. This results from inflammation of the urethra. DIAGNOSIS  Trichomoniasis may be found during a Pap test or physical exam. Your health care provider may use one of the following methods to help diagnose this infection:  Examining vaginal discharge under a microscope. For men, urethral discharge would be examined.  Testing the pH of the vagina with a test tape.  Using a vaginal swab test that checks for the Trichomonas organism. A test is available that provides results within a few minutes.  Doing a culture test for the organism. This is not usually needed. TREATMENT   You may be given medicine to fight the infection. Women should inform their health care provider if they could be or are pregnant. Some medicines used to treat the infection should not be taken during pregnancy.  Your health care provider may recommend over-the-counter medicines or creams to decrease itching or irritation.  Your sexual partner will need to be treated if infected. HOME CARE INSTRUCTIONS   Take medicines only as directed by your health care provider.  Take over-the-counter medicine for itching or irritation as directed by your health care provider.  Do not have sexual  intercourse while you have the infection.  Women should not douche or wear tampons while they have the infection.  Discuss your infection with your partner. Your partner may have gotten the infection from you, or you may have gotten it from your partner.  Have your sex partner get examined and treated if  necessary.  Practice safe, informed, and protected sex.  See your health care provider for other STI testing. SEEK MEDICAL CARE IF:   You still have symptoms after you finish your medicine.  You develop abdominal pain.  You have pain when you urinate.  You have bleeding after sexual intercourse.  You develop a rash.  Your medicine makes you sick or makes you throw up (vomit). MAKE SURE YOU:  Understand these instructions.  Will watch your condition.  Will get help right away if you are not doing well or get worse. Document Released: 01/19/2001 Document Revised: 12/10/2013 Document Reviewed: 05/07/2013 [facility] Patient Information 2015 [place], [place]. This information is not intended to replace advice given to you by your health care provider. Make sure you discuss any questions you have with your health care provider.

## 2014-06-22 LAB — GC/CHLAMYDIA PROBE AMP
CT Probe RNA: NEGATIVE
GC Probe RNA: NEGATIVE

## 2014-06-22 LAB — RPR

## 2014-06-22 LAB — HIV ANTIBODY (ROUTINE TESTING W REFLEX): HIV 1&2 Ab, 4th Generation: NONREACTIVE

## 2014-07-26 ENCOUNTER — Emergency Department (HOSPITAL_COMMUNITY)
Admission: EM | Admit: 2014-07-26 | Discharge: 2014-07-26 | Disposition: A | Discharge status: Home / Self Care | Payer: Self-pay | Attending: Emergency Medicine | Admitting: Emergency Medicine

## 2014-07-26 ENCOUNTER — Emergency Department (HOSPITAL_COMMUNITY): Payer: Self-pay

## 2014-07-26 ENCOUNTER — Encounter (HOSPITAL_COMMUNITY): Payer: Self-pay | Admitting: Cardiology

## 2014-07-26 DIAGNOSIS — B9689 Other specified bacterial agents as the cause of diseases classified elsewhere: Secondary | ICD-10-CM

## 2014-07-26 DIAGNOSIS — K088 Other specified disorders of teeth and supporting structures: Secondary | ICD-10-CM | POA: Insufficient documentation

## 2014-07-26 DIAGNOSIS — R102 Pelvic and perineal pain: Secondary | ICD-10-CM

## 2014-07-26 DIAGNOSIS — N760 Acute vaginitis: Secondary | ICD-10-CM | POA: Insufficient documentation

## 2014-07-26 DIAGNOSIS — Z3202 Encounter for pregnancy test, result negative: Secondary | ICD-10-CM | POA: Insufficient documentation

## 2014-07-26 DIAGNOSIS — K089 Disorder of teeth and supporting structures, unspecified: Secondary | ICD-10-CM

## 2014-07-26 DIAGNOSIS — G8929 Other chronic pain: Secondary | ICD-10-CM | POA: Insufficient documentation

## 2014-07-26 DIAGNOSIS — Z8619 Personal history of other infectious and parasitic diseases: Secondary | ICD-10-CM | POA: Insufficient documentation

## 2014-07-26 DIAGNOSIS — Z720 Tobacco use: Secondary | ICD-10-CM | POA: Insufficient documentation

## 2014-07-26 DIAGNOSIS — J45909 Unspecified asthma, uncomplicated: Secondary | ICD-10-CM | POA: Insufficient documentation

## 2014-07-26 DIAGNOSIS — F410 Panic disorder [episodic paroxysmal anxiety] without agoraphobia: Secondary | ICD-10-CM | POA: Insufficient documentation

## 2014-07-26 DIAGNOSIS — Z79899 Other long term (current) drug therapy: Secondary | ICD-10-CM | POA: Insufficient documentation

## 2014-07-26 DIAGNOSIS — N7011 Chronic salpingitis: Secondary | ICD-10-CM | POA: Insufficient documentation

## 2014-07-26 HISTORY — DX: Other chronic pain: G89.29

## 2014-07-26 HISTORY — DX: Pelvic and perineal pain: R10.2

## 2014-07-26 HISTORY — DX: Panic disorder (episodic paroxysmal anxiety): F41.0

## 2014-07-26 HISTORY — DX: Pelvic and perineal pain unspecified side: R10.20

## 2014-07-26 HISTORY — DX: Disorder of teeth and supporting structures, unspecified: K08.9

## 2014-07-26 LAB — URINALYSIS, ROUTINE W REFLEX MICROSCOPIC
Bilirubin Urine: NEGATIVE
Glucose, UA: NEGATIVE mg/dL
Hgb urine dipstick: NEGATIVE
Ketones, ur: NEGATIVE mg/dL
Leukocytes, UA: NEGATIVE
Nitrite: NEGATIVE
Protein, ur: NEGATIVE mg/dL
Specific Gravity, Urine: 1.025 (ref 1.005–1.030)
Urobilinogen, UA: 0.2 mg/dL (ref 0.0–1.0)
pH: 6.5 (ref 5.0–8.0)

## 2014-07-26 LAB — WET PREP, GENITAL
Trich, Wet Prep: NONE SEEN
Yeast Wet Prep HPF POC: NONE SEEN

## 2014-07-26 LAB — PREGNANCY, URINE: Preg Test, Ur: NEGATIVE

## 2014-07-26 MED ORDER — DOXYCYCLINE HYCLATE 100 MG PO TABS
100.0000 mg | ORAL_TABLET | Freq: Once | ORAL | Status: AC
  Administered 2014-07-26: 100 mg via ORAL
  Filled 2014-07-26: qty 1

## 2014-07-26 MED ORDER — NAPROXEN 250 MG PO TABS: 250.0000 mg | ORAL_TABLET | Freq: Two times a day (BID) | ORAL | Status: DC | PRN

## 2014-07-26 MED ORDER — CEFTRIAXONE SODIUM 250 MG IJ SOLR
250.0000 mg | Freq: Once | INTRAMUSCULAR | Status: AC
  Administered 2014-07-26: 250 mg via INTRAMUSCULAR
  Filled 2014-07-26: qty 250

## 2014-07-26 MED ORDER — LIDOCAINE HCL (PF) 2 % IJ SOLN
INTRAMUSCULAR | Status: AC
  Administered 2014-07-26: 2.1 mL
  Filled 2014-07-26: qty 10

## 2014-07-26 MED ORDER — METRONIDAZOLE 500 MG PO TABS: 500.0000 mg | ORAL_TABLET | Freq: Two times a day (BID) | ORAL | Status: DC

## 2014-07-26 MED ORDER — DOXYCYCLINE HYCLATE 100 MG PO TABS: 100.0000 mg | ORAL_TABLET | Freq: Two times a day (BID) | ORAL | Status: DC

## 2014-07-26 MED ORDER — METRONIDAZOLE 500 MG PO TABS
500.0000 mg | ORAL_TABLET | Freq: Once | ORAL | Status: AC
  Administered 2014-07-26: 500 mg via ORAL
  Filled 2014-07-26: qty 1

## 2014-07-26 MED ORDER — HYDROCODONE-ACETAMINOPHEN 5-325 MG PO TABS
1.0000 | ORAL_TABLET | Freq: Once | ORAL | Status: AC
  Administered 2014-07-26: 1 via ORAL
  Filled 2014-07-26: qty 1

## 2014-07-26 NOTE — ED Notes (Signed)
Pt requested pain med, EDP aware.

## 2014-07-26 NOTE — Discharge Instructions (Signed)
°Emergency Department Resource Guide °1) Find a Doctor and Pay Out of Pocket °Although you won't have to find out who is covered by your insurance plan, it is a good idea to ask around and get recommendations. You will then need to call the office and see if the doctor you have chosen will accept you as a new patient and what types of options they offer for patients who are self-pay. Some doctors offer discounts or will set up payment plans for their patients who do not have insurance, but you will need to ask so you aren't surprised when you get to your appointment. ° °2) Contact Your Local Health Department °Not all health departments have doctors that can see patients for sick visits, but many do, so it is worth a call to see if yours does. If you don't know where your local health department is, you can check in your phone book. The CDC also has a tool to help you locate your state's health department, and many state websites also have listings of all of their local health departments. ° °3) Find a Walk-in Clinic °If your illness is not likely to be very severe or complicated, you may want to try a walk in clinic. These are popping up all over the country in pharmacies, drugstores, and shopping centers. They're usually staffed by nurse practitioners or physician assistants that have been trained to treat common illnesses and complaints. They're usually fairly quick and inexpensive. However, if you have serious medical issues or chronic medical problems, these are probably not your best option. ° °No Primary Care Doctor: °- Call [facility] at  832-8000 - they can help you locate a primary care doctor that  accepts your insurance, provides certain services, etc. °- Physician Referral Service- 1-800-533-3463 ° °Chronic Pain Problems: °Organization         Address  Phone   Notes  °[facility]  (336) 297-2271 Patients need to be referred by their primary care doctor.  ° °Medication  Assistance: °Organization         Address  Phone   Notes  °[place] [facility] [address] °[place], [place] [address] (336) 641-8030 --Must be a resident of [place] °-- Must have NO insurance coverage whatsoever (no Medicaid/ Medicare, etc.) °-- The pt. MUST have a primary care doctor that directs their care regularly and follows them in the community °  °[facility]  (866) 331-1348   °[employer]  (888) 892-1162   ° °Agencies that provide inexpensive medical care: °Organization         Address  Phone   Notes  °[facility]  (336) 832-8035   °[facility]    (336) 832-7272   °[facility] [address] °[place], [place] [address] (336) 832-4777   °[facility] [address], °[place] (336) 271-4999   °[facility]    (336) 373-0678   °[facility]    (336) 272-1050   °[facility] ° [address], [place] Phone:  (336) 832-4444, Fax:  (336) 832-4440 Hours of Operation:  9 am - 6 pm, M-F.  Also accepts Medicaid/Medicare and self-pay.  °[facility] ° [address], [place] Phone: (336) 832-3150, Fax: (336) [phone]. Hours of Operation:  8:30 am - 5:30 pm, M-F.  Also accepts Medicaid and self-pay.  °[facility] [place] [address], [place] Phone: (336) 878-6027   °[facility] [address], [place], [place] (336)723-1848, Ext. 123 Mondays & Thursdays: 7-9 AM.  First 15 patients are seen on a first come, first serve basis. °  ° °Medicaid-accepting [place] Providers: ° °Organization         Address  Phone   Notes  °[facility] [address], [place] (336) 641-2100 Also accepts self-pay patients.  °[facility] [address], [place] ° (336) 856-9996   °[facility] [address], [place]  (336) 288-8857   °[facility] [address] [place] [address], [place] (336) 299-7000   °Veita Bland [address], [place]  ° (336) 373-1557 Only accepts [place] Access Medicaid patients after they have their name applied to their card.  ° °Self-Pay (no insurance) in [place]: ° °Organization         Address  Phone   Notes  °Sickle Cell Patients, [facility] [address], [place] (336) 832-1970   °[facility] [address], [place] (336) 832-4400   °[facility] ° [address], [place] (336) 992-4800   °[facility]/Dr. Osei-Bonsu ° [address] [place] [address], [place] or [address], [place] (336) 841-8500 Phone number for both [place] and [place] locations is the same.  °[facility] [address], [place] (336) 299-0000   °[facility] [address] [place] [address], [place] or [address] (336) 852-7530 °(336) 878-2260   °[facility] [address], [place] (336) 350-1642, phone; (336) 294-5005, fax Sees patients 1st and 3rd Saturday of every month.  Must not qualify for public or private insurance (i.e. Medicaid, Medicare, [place] Health Choice, Veterans' Benefits) • Household income should be no more than 200% of the poverty level •The clinic cannot treat you if you are pregnant or think you are pregnant • Sexually transmitted diseases are not treated at the clinic.  ° ° °Dental Care: °Organization         Address  Phone  Notes  °[place] [facility] [address], [place] (336) 641-6152 Accepts children up to age 21 who are enrolled in Medicaid or [place] Health Choice; pregnant women with a Medicaid card; and children who have applied for Medicaid or [place] Health Choice, but were declined, whose parents can pay a reduced fee at time of service.  °[place]  [facility] [place]  [address], [place] (336) 641-7733 Accepts children up to age 21 who are enrolled in Medicaid or [place] Health Choice; pregnant women with a Medicaid card; and children who have applied for Medicaid or [place] Health Choice, but were declined, whose parents can pay a reduced fee at time of service.  °[facility] ° [address], [place] (336) 641-4533 Patients are seen by appointment only. Walk-ins are not accepted. [facility] will see patients 18 years of age and older. °Monday - Tuesday (8am-5pm) °Most Wednesdays (8:30-5pm) °$30 per visit, cash only  °[facility] ° [address], [place] (336) 641-4533 Patients are seen by appointment only. Walk-ins are not accepted. [facility] will see patients 18 years of age and older. °One   Wednesday Evening (Monthly: Volunteer Based).  $30 per visit, cash only  °[facility]  (919) 537-3737 for adults; Children under age 4, call Graduate Pediatric Dentistry at (919) [phone]. Children aged 4-14, please call (919) [phone] to request a pediatric application. ° Dental services are provided in all areas of dental care including fillings, crowns and bridges, complete and partial dentures, implants, gum treatment, root canals, and extractions. Preventive care is also provided. Treatment is provided to both adults and children. °Patients are selected via a lottery and there is often a waiting list. °  °[facility] [address], °[place] ° (336) 763-8833 [email or web address] °  °[facility] [address], [place], [place] (336)723-1848, Ext. 123 Second and Fourth Thursday of each month, opens at 6:30 AM; Clinic ends at 9 AM.  Patients are seen on a first-come first-served basis, and a limited number are seen during each clinic.  ° °[facility] ° [address], [place], [place] (336) 723-7904    Eligibility Requirements °You must have lived in [place], [place], or [place] counties for at least the last three months. °  You cannot be eligible for state or federal sponsored healthcare insurance, including Veterans Administration, Medicaid, or Medicare. °  You generally cannot be eligible for healthcare insurance through your employer.  °  How to apply: °Eligibility screenings are held every Tuesday and Wednesday afternoon from 1:00 pm until 4:00 pm. You do not need an appointment for the interview!  °[facility] [address], [place], [place] 336-631-2330   °[facility]  336-342-8273   °[facility]  336-703-3100   °[facility]  336-570-6415   ° °Behavioral Health Resources in the Community: °Intensive Outpatient Programs °Organization         Address  Phone  Notes  °[facility] [address], [place], [place] 336-878-6098   °[facility] [address], [place], [place] 336-832-9800   °[facility] [address], [place], [place] ° 336-882-2125   °[facility] [address],  °[place], [place] 1-800-853-5163 or 336-641-4981   °Substance Abuse Resources °Organization         Address  Phone  Notes  °[facility]  336-882-2125   °[facility]  336-784-9470   °[facility]  336-285-9073   °[facility]  336-845-3988   °Residential & Outpatient Substance Abuse Program  1-800-659-3381   °Psychological Services °Organization         Address  Phone  Notes  °[facility]  336- 832-9600   °[facility]  336- 378-7881   °[facility] [address], [place] 1-800-853-5163 or 336-641-4981   ° °Mobile Crisis Teams °Organization         Address  Phone  Notes  °[facility], Mobile Crisis Care Unit  1-877-626-1772   °Assertive °[facility] ° [address]  [place], [place] 336-834-9664   °Sharon DeEsch [address] °[place] 336-554-5454   ° °Self-Help/Support Groups °Organization         Address  Phone             Notes  °[facility]  - variety of support groups  336- 373-1402 Call for more information  °Narcotics Anonymous (NA), [facility] [address], °[place]  2 meetings at this location  ° °  Residential Treatment Programs °Organization         Address  Phone  Notes  °[facility] [address],    °[place]  1-866-801-8205   °[facility] ° [address], [place], [place] 704-293-8524   °[facility] [address], [place] 336-845-3988 Admissions: 8am-3pm M-F  °[facility] [address]    °[place], [place] 336-841-1104   °[facility] [address], [place], [place] 336-379-7146   °[facility] [address]  °[place], [place] 336-285-9073   °[facility] - Intensive Outpatient [address], [place], [place] 336-852-3033   °[facility] [address]  °[place], [place] 1-877-615-2722 or 336-784-9470   °[facility] [address] [place], [place] 336-227-7417 Accepts Medicaid  °[facility] [address]  °[place] 1-800-659-3381 Substance Abuse/Addiction Treatment  ° °[place] Behavioral Health Resources °Organization         Address  Phone  Notes  °[facility]  (888) 581-9988   °Julie Brannon, PhD [address] [place], [place]   (336) 349-5553 or (336) 951-0000   °[facility]   [address] °[place], [place] (336) 349-4454   °[facility] [address], [place], [place] (336) 342-8316 Insurance/Medicaid/sponsorship through [facility]  °[facility] [address]                                    [place], [place] (336) 342-8316 Therapy/tele-psych/case    °[facility] [address]  ° [place], [place] (336) 349-2233    °Dr. Arfeen  (336) 349-4544   °[facility] [place]  [employer] [place] [facility] 1) [address], [place] °2) [address], [place] °3)  [address], [place] (336) 349-3220 °(336) 342-7768 ° °(336) 342-8140   °[place] Child Abuse [facility] (336) 342-1394 or (336) 342-3537 (After Hours)    ° ° °Take the prescriptions as directed.  Call your regular OB/GYN doctor tomorrow to schedule a follow up appointment within the next week.  Return to the Emergency Department immediately sooner if worsening.  ° °

## 2014-07-26 NOTE — ED Provider Notes (Signed)
CSN: [id number]     Arrival date & time 07/26/14  1231 History   First MD Initiated Contact with Patient 07/26/14 1309     Chief Complaint  Patient presents with  . Abdominal Pain     HPI  Pt was seen at 1310. Per pt, c/o gradual onset and persistence of constant acute flair of her chronic pelvic "pain" for the past 1 month, worse over the past few weeks. Has been associated with vaginal discharge. Pt states she has been evaluated in the ED several times previously for these symptoms, dx BV, rx flagyl without improvement. Denies vaginal bleeding, no dysuria/hematuria, no flank pain, no N/V/D, no CP/SOB, no fevers, no rash. Pt also c/o gradual onset and persistence of constant acute flair of her chronic upper left tooth "pain" for the past several months. Pt has not f/u with a dentist as previously instructed. Denies fevers, no intra-oral edema, no rash, no facial swelling, no dysphagia, no neck pain.   The condition is aggravated by nothing. The condition is relieved by nothing. The patient has no significant history of serious medical conditions. The symptoms have been associated with no other complaints. The patient has a significant history of similar symptoms previously, recently being evaluated for this complaint and multiple prior evals for same.      Past Medical History  Diagnosis Date  . Ectopic pregnancy   . Asthma   . Trichomonas vaginitis   . BV (bacterial vaginosis) 03/06/2013  . Chronic dental pain   . Panic attacks   . Chronic pelvic pain in female    Past Surgical History  Procedure Laterality Date  . Ectopic pregnancy surgery     Family History  Problem Relation Age of Onset  . COPD Mother    History  Substance Use Topics  . Smoking status: Current Every Day Smoker -- 1.00 packs/day    Types: Cigarettes  . Smokeless tobacco: Never Used  . Alcohol Use: No   OB History    Gravida Para Term Preterm AB TAB SAB Ectopic Multiple Living   6 3 3  3 2  1         Review of Systems ROS: Statement: All systems negative except as marked or noted in the HPI; Constitutional: Negative for fever and chills. ; ; Eyes: Negative for eye pain and discharge. ; ; ENMT: Positive for dental caries, dental hygiene poor and toothache. Negative for ear pain, bleeding gums, dental injury, facial deformity, facial swelling, hoarseness, nasal congestion, sinus pressure, sore throat, throat swelling and tongue swollen. ; ; Cardiovascular: Negative for chest pain, palpitations, diaphoresis, dyspnea and peripheral edema. ; ; Respiratory: Negative for cough, wheezing and stridor. ; ; Gastrointestinal: Negative for nausea, vomiting, diarrhea and abdominal pain. ; ; Genitourinary: Negative for dysuria, flank pain and hematuria. ; ; GYN:  No vaginal bleeding, +pelvic pain, +vaginal discharge, no vulvar pain. ;; Musculoskeletal: Negative for back pain and neck pain. ; ; Skin: Negative for rash and skin lesion. ; ; Neuro: Negative for headache, lightheadedness and neck stiffness. ;     Allergies  Review of patient's allergies indicates no known allergies.  Home Medications   Prior to Admission medications   Medication Sig Start Date End Date Taking? Authorizing Provider  HYDROcodone-acetaminophen (NORCO/VICODIN) 5-325 MG per tablet Take 1 tablet by mouth every 4 (four) hours as needed for moderate pain.   Yes Historical Provider, MD  ibuprofen (ADVIL,MOTRIN) 200 MG tablet Take 200 mg by mouth every 6 (six) hours  as needed for moderate pain.   Yes Historical Provider, MD  traZODone (DESYREL) 100 MG tablet Take 100 mg by mouth at bedtime.   Yes Historical Provider, MD  acetaminophen (TYLENOL) 500 MG tablet Take 1,000 mg by mouth every 6 (six) hours as needed for moderate pain.    Historical Provider, MD  amoxicillin (AMOXIL) 500 MG capsule Take 1 capsule (500 mg total) by mouth 3 (three) times daily. For 10 days Patient not taking: Reported on 06/21/2014 03/25/14   Tammy L. Triplett,  PA-C  diclofenac (VOLTAREN) 75 MG EC tablet Take 1 tablet (75 mg total) by mouth 2 (two) times daily. Take with food Patient not taking: Reported on 06/21/2014 03/25/14   Tammy L. Triplett, PA-C  metroNIDAZOLE (FLAGYL) 500 MG tablet Take 1 tablet (500 mg total) by mouth 2 (two) times daily. Patient not taking: Reported on 07/26/2014 06/21/14   Burgess AmorJulie Idol, PA-C  oxyCODONE-acetaminophen (PERCOCET/ROXICET) 5-325 MG per tablet Take 1 tablet by mouth every 4 (four) hours as needed. Patient not taking: Reported on 07/26/2014 06/21/14   Burgess AmorJulie Idol, PA-C   BP 112/79 mmHg  Pulse 76  Temp(Src) 98.3 F (36.8 C) (Oral)  Resp 18  Ht 5\' 1"  (1.549 m)  Wt 145 lb (65.772 kg)  BMI 27.41 kg/m2  SpO2 100%  LMP 07/05/2014 Physical Exam  1315: Physical examination: Vital signs and O2 SAT: Reviewed; Constitutional: Well developed, Well nourished, Well hydrated, In no acute distress. Eating snack foods on my arrival to the exam room.; Head and Face: Normocephalic, Atraumatic; Eyes: EOMI, PERRL, No scleral icterus; ENMT: Mouth and pharynx normal, Poor dentition, Widespread dental decay, Left TM normal, Right TM normal, Mucous membranes moist, +upper left lateral incisor with extensive dental decay.  No gingival erythema, edema, fluctuance, or drainage.  No intra-oral edema. No submandibular or sublingual edema. No hoarse voice, no drooling, no stridor. No trismus. ; Neck: Supple, Full range of motion, No lymphadenopathy; Cardiovascular: Regular rate and rhythm, No murmur, rub, or gallop; Respiratory: Breath sounds clear & equal bilaterally, No rales, rhonchi, wheezes, Normal respiratory effort/excursion; Chest: Nontender, Movement normal; Abdomen: Soft, +suprapubic tenderness to palp. Nondistended, Normal bowel sounds; Genitourinary: No CVA tenderness. Pelvic exam performed with permission of pt and female ED tech assist during exam.  External genitalia w/o lesions. Vaginal vault with thin white discharge.  Cervix w/o  lesions, not friable, GC/chlam and wet prep obtained and sent to lab.  Bimanual exam w/o CMT, uterine or adnexal tenderness. +suprapubic tenderness to palp.; Extremities: Pulses normal, No tenderness, No edema; Neuro: AA&Ox3, Major CN grossly intact.  No gross focal motor or sensory deficits in extremities. Climbs on and off stretcher easily by herself. Gait steady.; Skin: Color normal, No rash, No petechiae, Warm, Dry    ED Course  Procedures     EKG Interpretation None      MDM  MDM Reviewed: previous chart, nursing note and vitals Reviewed previous: labs Interpretation: labs and ultrasound     Results for orders placed or performed during the hospital encounter of 07/26/14  Wet prep, genital  Result Value Ref Range   Yeast Wet Prep HPF POC NONE SEEN NONE SEEN   Trich, Wet Prep NONE SEEN NONE SEEN   Clue Cells Wet Prep HPF POC RARE (A) NONE SEEN   WBC, Wet Prep HPF POC FEW (A) NONE SEEN  Pregnancy, urine  Result Value Ref Range   Preg Test, Ur NEGATIVE NEGATIVE  Urinalysis, Routine w reflex microscopic  Result Value Ref Range  Color, Urine YELLOW YELLOW   APPearance CLEAR CLEAR   Specific Gravity, Urine 1.025 1.005 - 1.030   pH 6.5 5.0 - 8.0   Glucose, UA NEGATIVE NEGATIVE mg/dL   Hgb urine dipstick NEGATIVE NEGATIVE   Bilirubin Urine NEGATIVE NEGATIVE   Ketones, ur NEGATIVE NEGATIVE mg/dL   Protein, ur NEGATIVE NEGATIVE mg/dL   Urobilinogen, UA 0.2 0.0 - 1.0 mg/dL   Nitrite NEGATIVE NEGATIVE   Leukocytes, UA NEGATIVE NEGATIVE   US Pelvis Complete 07/26/2014   CLINICAL DATA:  Pelvic pain, rule out torsion, pelvic pain for 1 month  EXAM: TRANSABDOMINAL AND TRANSVAGINAL ULTRASOUND OF PELVIS  DOPPLER ULTRASOUND OF OVARIES  TECHNIQUE: Both transabdominal and transvaginal ultrasound examinations of the pelvis were performed. Transabdominal technique was performed for global imaging of the pelvis including uterus, ovaries, adnexal regions, and pelvic cul-de-sac.  It was  necessary to proceed with endovaginal exam following the transabdominal exam to visualize the uterus and ovaries. Color and duplex Doppler ultrasound was utilized to evaluate blood flow to the ovaries.  COMPARISON:  CT scan 10/17/2012  FINDINGS: Uterus  Measurements: 8.4 x 4 x 5.3 cm. No fibroids or other mass visualized.  Endometrium  Thickness: 6.7 mm in diameter within normal limits. No focal abnormality visualized.  Right ovary  Measurements: Measures 3.7 x 1.7 x 2.9 cm. There is probable collapsed follicle within right ovary measures 2.4 x 2.1 cm with small amount of adjacent free fluid. Normal arterial and venous flow, normal color Doppler flow. No evidence of ovarian torsion.  Left ovary  Measurements: Measures 3.7 x 1.5 x 2.8 cm. Small complex cyst/ follicle measures 1.5 by 1.2 cm. No evidence of ovarian torsion. There is elongated fluid collection adjacent to left adnexa measures 9 mm thickness suspicious for left hydrosalpinx.  Pulsed Doppler evaluation of both ovaries demonstrates normal low-resistance arterial and venous waveforms.  Other findings  Small pelvic free fluid. Exam is suboptimal due to abundant bowel gas.  IMPRESSION: 1. Unremarkable uterus, normal endometrial stripe thickness. 2. Bilateral normal size ovary. No evidence of ovarian torsion. A probable collapsed follicle within right ovary measures 2.4 x 2.1 cm with small amount of adjacent free fluid. 3. There is a small complex follicle cyst in left ovary measures 1.5 x 1.2 cm. Question small left hydrosalpinx. 4. Small amount of pelvic free fluid.   Electronically Signed   By: Natasha Mead M.D.   On: 07/26/2014 17:04    1710:  Will tx for STD, BV. Pt requesting "more pain meds" multiple times to multiple providers during her ED visit. Pt appears NAD, snacking, laying on stretcher, watching TV and talking with friend at bedside. [place] Controlled Substance Database accessed: pt filled hydrocodone/APAP 7.5mg /325mg  tabs, #120, on 07/23/14. Pt  reminded of this prescription. Pt verb understanding then states she was ready to go home now. Long hx of chronic pain with multiple ED visits for same. Pt encouraged to f/u with her PMD and OB/GYN doctor for good continuity of care and control of her chronic pain.  Verb understanding.    Samuel Jester, DO 07/29/14 [id number]

## 2014-07-26 NOTE — ED Notes (Addendum)
Lower abdominal pain times one week.   Toothache for over a month.

## 2014-07-27 LAB — GC/CHLAMYDIA PROBE AMP
CT Probe RNA: NEGATIVE
GC Probe RNA: NEGATIVE

## 2014-08-09 NOTE — L&D Delivery Note (Cosign Needed)
Delivery Note At 7:56 PM a viable and healthy female was delivered via Vaginal, Spontaneous Delivery (Presentation: Right Occiput Anterior).  Dark green copious discharge.  Vigorous cry at delivery;  NICU called to be present due to drug use, possible prolonged ROM, limited care.  APGAR: 8, 9; weight 5 lb 8.4 oz (2505 g).   Placenta status: Intact, Spontaneous.  Cord: 3 vessels with the following complications: None.  Cord pH: [name]/a  Anesthesia: None  Episiotomy: None Lacerations: None Est. Blood Loss (mL): 300  Mom to postpartum.  Baby to Couplet care / Skin to Skin.  Susan [name], Susan [name] 08/03/2015, 8:51 PM

## 2014-09-05 ENCOUNTER — Emergency Department (HOSPITAL_COMMUNITY)
Admission: EM | Admit: 2014-09-05 | Discharge: 2014-09-05 | Disposition: A | Discharge status: Home / Self Care | Payer: Self-pay | Attending: Emergency Medicine | Admitting: Emergency Medicine

## 2014-09-05 ENCOUNTER — Encounter (HOSPITAL_COMMUNITY): Payer: Self-pay

## 2014-09-05 DIAGNOSIS — Z8619 Personal history of other infectious and parasitic diseases: Secondary | ICD-10-CM | POA: Insufficient documentation

## 2014-09-05 DIAGNOSIS — F410 Panic disorder [episodic paroxysmal anxiety] without agoraphobia: Secondary | ICD-10-CM | POA: Insufficient documentation

## 2014-09-05 DIAGNOSIS — Z791 Long term (current) use of non-steroidal anti-inflammatories (NSAID): Secondary | ICD-10-CM | POA: Insufficient documentation

## 2014-09-05 DIAGNOSIS — J45909 Unspecified asthma, uncomplicated: Secondary | ICD-10-CM | POA: Insufficient documentation

## 2014-09-05 DIAGNOSIS — Z87448 Personal history of other diseases of urinary system: Secondary | ICD-10-CM | POA: Insufficient documentation

## 2014-09-05 DIAGNOSIS — Z720 Tobacco use: Secondary | ICD-10-CM | POA: Insufficient documentation

## 2014-09-05 DIAGNOSIS — Z792 Long term (current) use of antibiotics: Secondary | ICD-10-CM | POA: Insufficient documentation

## 2014-09-05 DIAGNOSIS — G8929 Other chronic pain: Secondary | ICD-10-CM | POA: Insufficient documentation

## 2014-09-05 DIAGNOSIS — Z3202 Encounter for pregnancy test, result negative: Secondary | ICD-10-CM | POA: Insufficient documentation

## 2014-09-05 DIAGNOSIS — M545 Low back pain, unspecified: Secondary | ICD-10-CM

## 2014-09-05 DIAGNOSIS — Z79899 Other long term (current) drug therapy: Secondary | ICD-10-CM | POA: Insufficient documentation

## 2014-09-05 LAB — URINALYSIS, ROUTINE W REFLEX MICROSCOPIC
Bilirubin Urine: NEGATIVE
Glucose, UA: NEGATIVE mg/dL
Hgb urine dipstick: NEGATIVE
Ketones, ur: NEGATIVE mg/dL
Leukocytes, UA: NEGATIVE
Nitrite: NEGATIVE
Protein, ur: NEGATIVE mg/dL
Specific Gravity, Urine: >1.03 — ABNORMAL HIGH (ref 1.005–1.030)
Urobilinogen, UA: 0.2 mg/dL (ref 0.0–1.0)
pH: 6 (ref 5.0–8.0)

## 2014-09-05 LAB — POC URINE PREG, ED: Preg Test, Ur: NEGATIVE

## 2014-09-05 MED ORDER — CYCLOBENZAPRINE HCL 10 MG PO TABS: 10.0000 mg | ORAL_TABLET | Freq: Three times a day (TID) | ORAL | Status: DC | PRN

## 2014-09-05 MED ORDER — OXYCODONE-ACETAMINOPHEN 5-325 MG PO TABS
1.0000 | ORAL_TABLET | Freq: Once | ORAL | Status: AC
  Administered 2014-09-05: 1 via ORAL
  Filled 2014-09-05: qty 1

## 2014-09-05 MED ORDER — OXYCODONE-ACETAMINOPHEN 5-325 MG PO TABS: 1.0000 | ORAL_TABLET | ORAL | Status: DC | PRN

## 2014-09-05 NOTE — Discharge Instructions (Signed)
Back Pain, Adult °Back pain is very common. The pain often gets better over time. The cause of back pain is usually not dangerous. Most people can learn to manage their back pain on their own.  °HOME CARE  °· Stay active. Start with short walks on flat ground if you can. Try to walk farther each day. °· Do not sit, drive, or stand in one place for more than 30 minutes. Do not stay in bed. °· Do not avoid exercise or work. Activity can help your back heal faster. °· Be careful when you bend or lift an object. Bend at your knees, keep the object close to you, and do not twist. °· Sleep on a firm mattress. Lie on your side, and bend your knees. If you lie on your back, put a pillow under your knees. °· Only take medicines as told by your doctor. °· Put ice on the injured area. °¨ Put ice in a plastic bag. °¨ Place a towel between your skin and the bag. °¨ Leave the ice on for 15-20 minutes, 03-04 times a day for the first 2 to 3 days. After that, you can switch between ice and heat packs. °· Ask your doctor about back exercises or massage. °· Avoid feeling anxious or stressed. Find good ways to deal with stress, such as exercise. °GET HELP RIGHT AWAY IF:  °· Your pain does not go away with rest or medicine. °· Your pain does not go away in 1 week. °· You have new problems. °· You do not feel well. °· The pain spreads into your legs. °· You cannot control when you poop (bowel movement) or pee (urinate). °· Your arms or legs feel weak or lose feeling (numbness). °· You feel sick to your stomach (nauseous) or throw up (vomit). °· You have belly (abdominal) pain. °· You feel like you may pass out (faint). °MAKE SURE YOU:  °· Understand these instructions. °· Will watch your condition. °· Will get help right away if you are not doing well or get worse. °Document Released: 01/12/2008 Document Revised: 10/18/2011 Document Reviewed: 11/27/2013 °[employer]® Patient Information ©2015 [employer]. This information is not intended  to replace advice given to you by your health care provider. Make sure you discuss any questions you have with your health care provider. ° °

## 2014-09-05 NOTE — ED Provider Notes (Signed)
CSN: [id number]     Arrival date & time 09/05/14  0848 History   First MD Initiated Contact with Patient 09/05/14 0901     No chief complaint on file.    (Consider location/radiation/quality/duration/timing/severity/associated sxs/prior Treatment) HPI  Susan [name] is a 39 y.o. female who presents to the Emergency Department complaining of worsening of her chronic low back pain.  She reports increased pain for 3 days and reports that pain feels similar to previous.  She describes the pain as sharp and worse with position change and walking.  Pain improves at rest.  She denies fever, chills, urine or bowel changes, numbness or weakness of the lower extremities, abdominal pain.  She has not tried any medications prior to ed arrival.   Past Medical History  Diagnosis Date  . Ectopic pregnancy   . Asthma   . Trichomonas vaginitis   . BV (bacterial vaginosis) 03/06/2013  . Chronic dental pain   . Panic attacks   . Chronic pelvic pain in female    Past Surgical History  Procedure Laterality Date  . Ectopic pregnancy surgery     Family History  Problem Relation Age of Onset  . COPD Mother    History  Substance Use Topics  . Smoking status: Current Every Day Smoker -- 1.00 packs/day    Types: Cigarettes  . Smokeless tobacco: Never Used  . Alcohol Use: No   OB History    Gravida Para Term Preterm AB TAB SAB Ectopic Multiple Living   Review of Systems  Constitutional: Negative for fever.  Respiratory: Negative for shortness of breath.   Gastrointestinal: Negative for vomiting, abdominal pain and constipation.  Genitourinary: Negative for dysuria, hematuria, flank pain, decreased urine volume and difficulty urinating.  Musculoskeletal: Positive for back pain. Negative for joint swelling.  Skin: Negative for rash.  Neurological: Negative for weakness and numbness.  All other systems reviewed and are negative.     Allergies  Review of patient's allergies  indicates no known allergies.  Home Medications   Prior to Admission medications   Medication Sig Start Date End Date Taking? Authorizing Provider  acetaminophen (TYLENOL) 500 MG tablet Take 1,000 mg by mouth every 6 (six) hours as needed for moderate pain.   Yes Historical Provider, MD  FLUoxetine HCl 60 MG TABS Take 60 mg by mouth daily.   Yes Historical Provider, MD  HYDROcodone-acetaminophen (NORCO/VICODIN) 5-325 MG per tablet Take 1 tablet by mouth every 4 (four) hours as needed for moderate pain.   Yes Historical Provider, MD  traZODone (DESYREL) 100 MG tablet Take 100 mg by mouth at bedtime.   Yes Historical Provider, MD  amoxicillin (AMOXIL) 500 MG capsule Take 1 capsule (500 mg total) by mouth 3 (three) times daily. For 10 days Patient not taking: Reported on 06/21/2014 03/25/14   Nakina Spatz L. Nyelle Wolfson, PA-C  diclofenac (VOLTAREN) 75 MG EC tablet Take 1 tablet (75 mg total) by mouth 2 (two) times daily. Take with food Patient not taking: Reported on 06/21/2014 03/25/14   Diera Wirkkala L. Zakiyah Diop, PA-C  doxycycline (VIBRA-TABS) 100 MG tablet Take 1 tablet (100 mg total) by mouth 2 (two) times daily. Patient not taking: Reported on 09/05/2014 07/26/14   Samuel Jester, DO  ibuprofen (ADVIL,MOTRIN) 200 MG tablet Take 200 mg by mouth every 6 (six) hours as needed for moderate pain.    Historical Provider, MD  metroNIDAZOLE (FLAGYL) 500 MG tablet Take  1 tablet (500 mg total) by mouth 2 (two) times daily. Patient not taking: Reported on 09/05/2014 07/26/14   Samuel JesterKathleen McManus, DO  naproxen (NAPROSYN) 250 MG tablet Take 1 tablet (250 mg total) by mouth 2 (two) times daily as needed for mild pain or moderate pain (take with food). Patient not taking: Reported on 09/05/2014 07/26/14   Samuel JesterKathleen McManus, DO  oxyCODONE-acetaminophen (PERCOCET/ROXICET) 5-325 MG per tablet Take 1 tablet by mouth every 4 (four) hours as needed. Patient not taking: Reported on 07/26/2014 06/21/14   Burgess AmorJulie Idol, PA-C   BP 117/56  mmHg  Pulse 77  Temp(Src) 98.6 F (37 C) (Oral)  Resp 16  Ht 5\' 1"  (1.549 m)  Wt 145 lb (65.772 kg)  BMI 27.41 kg/m2  SpO2 97%  LMP 08/22/2014 Physical Exam  Constitutional: She is oriented to person, place, and time. She appears well-developed and well-nourished. No distress.  HENT:  Head: Normocephalic and atraumatic.  Neck: Normal range of motion. Neck supple.  Cardiovascular: Normal rate, regular rhythm, normal heart sounds and intact distal pulses.   No murmur heard. Pulmonary/Chest: Effort normal and breath sounds normal. No respiratory distress.  Abdominal: Soft. She exhibits no distension. There is no tenderness. There is no rebound and no guarding.  Musculoskeletal: She exhibits tenderness. She exhibits no edema.       Lumbar back: She exhibits tenderness and pain. She exhibits normal range of motion, no swelling, no deformity, no laceration and normal pulse.  Diffuse ttp of the lumbar spine and bilateral paraspinal muscles.  DP pulses are brisk and symmetrical.  Distal sensation intact.   Pt has 5/5 strength against resistance of bilateral lower extremities.     Neurological: She is alert and oriented to person, place, and time. She has normal strength. No sensory deficit. She exhibits normal muscle tone. Coordination and gait normal.  Reflex Scores:      Patellar reflexes are 2+ on the right side and 2+ on the left side.      Achilles reflexes are 2+ on the right side and 2+ on the left side. Skin: Skin is warm and dry. No rash noted.  Nursing note and vitals reviewed.   ED Course  Procedures (including critical care time) Labs Review Labs Reviewed  URINALYSIS, ROUTINE W REFLEX MICROSCOPIC - Abnormal; Notable for the following:    Specific Gravity, Urine >1.030 (*)    All other components within normal limits  POC URINE PREG, ED    Imaging Review No results found.   EKG Interpretation None      MDM   Final diagnoses:  Bilateral low back pain without  sciatica    Bilateral low back pain, with hx of same.  U/A neg.  Ambulates with steady gait.  No focal neuro deficits.  No concerning sx's for emergent neurological process.  She agrees to symptomatic tx and close PMD f/u.  Return precautions given.    Morgen Ritacco L. Rowe Robertriplett, PA-C 09/06/14 1321  Raeford RazorStephen Kohut, MD 09/09/14 [phone]

## 2014-09-05 NOTE — ED Notes (Signed)
Pt reports has a pinched nerve in lower back and pain flared up 3 days ago.  Denies injury.

## 2014-09-14 ENCOUNTER — Encounter (HOSPITAL_COMMUNITY): Payer: Self-pay | Admitting: Emergency Medicine

## 2014-09-14 ENCOUNTER — Emergency Department (HOSPITAL_COMMUNITY)
Admission: EM | Admit: 2014-09-14 | Discharge: 2014-09-14 | Disposition: A | Discharge status: Home / Self Care | Payer: Self-pay | Attending: Emergency Medicine | Admitting: Emergency Medicine

## 2014-09-14 DIAGNOSIS — G8929 Other chronic pain: Secondary | ICD-10-CM | POA: Insufficient documentation

## 2014-09-14 DIAGNOSIS — Z79899 Other long term (current) drug therapy: Secondary | ICD-10-CM | POA: Insufficient documentation

## 2014-09-14 DIAGNOSIS — F410 Panic disorder [episodic paroxysmal anxiety] without agoraphobia: Secondary | ICD-10-CM | POA: Insufficient documentation

## 2014-09-14 DIAGNOSIS — Z720 Tobacco use: Secondary | ICD-10-CM | POA: Insufficient documentation

## 2014-09-14 DIAGNOSIS — Z8742 Personal history of other diseases of the female genital tract: Secondary | ICD-10-CM | POA: Insufficient documentation

## 2014-09-14 DIAGNOSIS — R300 Dysuria: Secondary | ICD-10-CM | POA: Insufficient documentation

## 2014-09-14 DIAGNOSIS — M545 Low back pain, unspecified: Secondary | ICD-10-CM

## 2014-09-14 DIAGNOSIS — J45909 Unspecified asthma, uncomplicated: Secondary | ICD-10-CM | POA: Insufficient documentation

## 2014-09-14 LAB — URINALYSIS, ROUTINE W REFLEX MICROSCOPIC
Bilirubin Urine: NEGATIVE
Glucose, UA: NEGATIVE mg/dL
Ketones, ur: NEGATIVE mg/dL
Leukocytes, UA: NEGATIVE
Nitrite: NEGATIVE
Protein, ur: NEGATIVE mg/dL
Specific Gravity, Urine: >1.03 — ABNORMAL HIGH (ref 1.005–1.030)
Urobilinogen, UA: 0.2 mg/dL (ref 0.0–1.0)
pH: 5.5 (ref 5.0–8.0)

## 2014-09-14 LAB — URINE MICROSCOPIC-ADD ON

## 2014-09-14 MED ORDER — IBUPROFEN 800 MG PO TABS
800.0000 mg | ORAL_TABLET | Freq: Once | ORAL | Status: AC
  Administered 2014-09-14: 800 mg via ORAL
  Filled 2014-09-14: qty 1

## 2014-09-14 MED ORDER — HYDROCODONE-ACETAMINOPHEN 5-325 MG PO TABS
2.0000 | ORAL_TABLET | Freq: Once | ORAL | Status: AC
Start: 2014-09-14 — End: 2014-09-14
  Administered 2014-09-14: 2 via ORAL
  Filled 2014-09-14: qty 2

## 2014-09-14 NOTE — ED Notes (Signed)
PT c/o lower back pain over a month with no reported injury and stated was seen in the ED last week for the same. PT denies any urinary symptoms.

## 2014-09-14 NOTE — ED Provider Notes (Signed)
CSN: [id number]     Arrival date & time 09/14/14  1214 History   First MD Initiated Contact with Patient 09/14/14 1331     Chief Complaint  Patient presents with  . Back Pain     (Consider location/radiation/quality/duration/timing/severity/associated sxs/prior Treatment) HPI Comments: Patient is a 39 year old female who presents to the emergency department with complaint of low back pain. The patient states she's been having this problem for some time. She was seen in the emergency department on January 28 at which time she was treated with pain medicine and muscle relaxer. The patient returns because she is continuing to have pain in her back. She knowledge is that she did not get her prescriptions filled as suggested, and neither has she followed up with anyone since her visit in January. There's been no loss of bowel or bladder function. There's been no falls. There's been no new accidents reported. The pain is aggravated by standing, walking, ending or stooping. Patient gets some relief from rest, and Flexeril, but the pain does not completely resolve.  Patient is a 39 y.o. female presenting with back pain. The history is provided by the patient.  Back Pain Location:  Lumbar spine Associated symptoms: dysuria   Associated symptoms: no abdominal pain and no chest pain     Past Medical History  Diagnosis Date  . Ectopic pregnancy   . Asthma   . Trichomonas vaginitis   . BV (bacterial vaginosis) 03/06/2013  . Chronic dental pain   . Panic attacks   . Chronic pelvic pain in female    Past Surgical History  Procedure Laterality Date  . Ectopic pregnancy surgery     Family History  Problem Relation Age of Onset  . COPD Mother    History  Substance Use Topics  . Smoking status: Current Every Day Smoker -- 1.00 packs/day    Types: Cigarettes  . Smokeless tobacco: Never Used  . Alcohol Use: No   OB History    Gravida Para Term Preterm AB TAB SAB Ectopic Multiple Living   Review of Systems  Constitutional: Negative for activity change.       All ROS Neg except as noted in HPI  Eyes: Negative for photophobia and discharge.  Respiratory: Negative for cough, shortness of breath and wheezing.   Cardiovascular: Negative for chest pain and palpitations.  Gastrointestinal: Negative for abdominal pain and blood in stool.  Genitourinary: Positive for dysuria. Negative for frequency and hematuria.  Musculoskeletal: Positive for back pain. Negative for arthralgias and neck pain.  Skin: Negative.   Neurological: Negative for dizziness, seizures and speech difficulty.  Psychiatric/Behavioral: Negative for hallucinations and confusion.      Allergies  Review of patient's allergies indicates no known allergies.  Home Medications   Prior to Admission medications   Medication Sig Start Date End Date Taking? Authorizing Provider  acetaminophen (TYLENOL) 500 MG tablet Take 1,000 mg by mouth every 6 (six) hours as needed for moderate pain.   Yes Historical Provider, MD  cyclobenzaprine (FLEXERIL) 10 MG tablet Take 1 tablet (10 mg total) by mouth 3 (three) times daily as needed. 09/05/14  Yes Tammy L. Triplett, PA-C  FLUoxetine HCl 60 MG TABS Take 60 mg by mouth daily.   Yes Historical Provider, MD  ibuprofen (ADVIL,MOTRIN) 200 MG tablet Take 200 mg by mouth every 6 (six) hours as needed for moderate pain.   Yes Historical Provider,  MD  traZODone (DESYREL) 100 MG tablet Take 100 mg by mouth at bedtime.   Yes Historical Provider, MD  amoxicillin (AMOXIL) 500 MG capsule Take 1 capsule (500 mg total) by mouth 3 (three) times daily. For 10 days Patient not taking: Reported on 06/21/2014 03/25/14   Tammy L. Triplett, PA-C  diclofenac (VOLTAREN) 75 MG EC tablet Take 1 tablet (75 mg total) by mouth 2 (two) times daily. Take with food Patient not taking: Reported on 06/21/2014 03/25/14   Tammy L. Triplett, PA-C  doxycycline (VIBRA-TABS) 100 MG tablet Take 1 tablet  (100 mg total) by mouth 2 (two) times daily. Patient not taking: Reported on 09/05/2014 07/26/14   Samuel JesterKathleen McManus, DO  metroNIDAZOLE (FLAGYL) 500 MG tablet Take 1 tablet (500 mg total) by mouth 2 (two) times daily. Patient not taking: Reported on 09/05/2014 07/26/14   Samuel JesterKathleen McManus, DO  naproxen (NAPROSYN) 250 MG tablet Take 1 tablet (250 mg total) by mouth 2 (two) times daily as needed for mild pain or moderate pain (take with food). Patient not taking: Reported on 09/05/2014 07/26/14   Samuel JesterKathleen McManus, DO  oxyCODONE-acetaminophen (PERCOCET/ROXICET) 5-325 MG per tablet Take 1 tablet by mouth every 4 (four) hours as needed. Patient not taking: Reported on 09/14/2014 09/05/14   Tammy L. Triplett, PA-C   BP 146/74 mmHg  Pulse 74  Temp(Src) 97.8 F (36.6 C) (Oral)  Resp 18  Ht 5\' 1"  (1.549 m)  Wt 145 lb (65.772 kg)  BMI 27.41 kg/m2  SpO2 100%  LMP 08/22/2014 Physical Exam  Constitutional: She is oriented to person, place, and time. She appears well-developed and well-nourished.  Non-toxic appearance.  HENT:  Head: Normocephalic.  Right Ear: Tympanic membrane and external ear normal.  Left Ear: Tympanic membrane and external ear normal.  Eyes: EOM and lids are normal. Pupils are equal, round, and reactive to light.  Neck: Normal range of motion. Neck supple. Carotid bruit is not present.  Cardiovascular: Normal rate, regular rhythm, normal heart sounds, intact distal pulses and normal pulses.   Pulmonary/Chest: Breath sounds normal. No respiratory distress.  Abdominal: Soft. Bowel sounds are normal. There is no tenderness. There is no guarding.  Musculoskeletal:       Lumbar back: She exhibits decreased range of motion and tenderness. She exhibits no deformity.  Lymphadenopathy:       Head (right side): No submandibular adenopathy present.       Head (left side): No submandibular adenopathy present.    She has no cervical adenopathy.  Neurological: She is alert and oriented to person,  place, and time. She has normal strength. No cranial nerve deficit or sensory deficit.  Skin: Skin is warm and dry.  Psychiatric: She has a normal mood and affect. Her speech is normal.  Nursing note and vitals reviewed.   ED Course  Procedures (including critical care time) Labs Review Labs Reviewed  URINALYSIS, ROUTINE W REFLEX MICROSCOPIC - Abnormal; Notable for the following:    APPearance HAZY (*)    Specific Gravity, Urine >1.030 (*)    Hgb urine dipstick TRACE (*)    All other components within normal limits  URINE MICROSCOPIC-ADD ON - Abnormal; Notable for the following:    Squamous Epithelial / LPF FEW (*)    Bacteria, UA FEW (*)    All other components within normal limits    Imaging Review No results found.   EKG Interpretation None      MDM  Pt seen in ED on 1/28 for exacerbation  of chronic low back pain. Today's is also for low back pain. No gross neuro deficit noted. Vital signs non-acute. Pt c/o some dysuria,but no acute UTI noted on UA exam. Pt encouraged to see her PCP or establish relationship with [facility], or [facility] clinics.    Final diagnoses:  None    *I have reviewed nursing notes, vital signs, and all appropriate lab and imaging results for this patient.[id number] Garry Heater, PA-C 09/17/14 1418  Rolland Porter, MD 09/21/14 [phone]

## 2014-09-14 NOTE — ED Notes (Signed)
Pt c/o pain with urination. U/A ordered.

## 2014-09-14 NOTE — Discharge Instructions (Signed)
Please rest your back is much as possible. Please apply heat. Please see the physicians at the clinic resources given to you to establish a primary physician and for assistance with this back issue. Back Pain, Adult Back pain is very common. The pain often gets better over time. The cause of back pain is usually not dangerous. Most people can learn to manage their back pain on their own.  HOME CARE   Stay active. Start with short walks on flat ground if you can. Try to walk farther each day.  Do not sit, drive, or stand in one place for more than 30 minutes. Do not stay in bed.  Do not avoid exercise or work. Activity can help your back heal faster.  Be careful when you bend or lift an object. Bend at your knees, keep the object close to you, and do not twist.  Sleep on a firm mattress. Lie on your side, and bend your knees. If you lie on your back, put a pillow under your knees.  Only take medicines as told by your doctor.  Put ice on the injured area.  Put ice in a plastic bag.  Place a towel between your skin and the bag.  Leave the ice on for 15-20 minutes, 03-04 times a day for the first 2 to 3 days. After that, you can switch between ice and heat packs.  Ask your doctor about back exercises or massage.  Avoid feeling anxious or stressed. Find good ways to deal with stress, such as exercise. GET HELP RIGHT AWAY IF:   Your pain does not go away with rest or medicine.  Your pain does not go away in 1 week.  You have new problems.  You do not feel well.  The pain spreads into your legs.  You cannot control when you poop (bowel movement) or pee (urinate).  Your arms or legs feel weak or lose feeling (numbness).  You feel sick to your stomach (nauseous) or throw up (vomit).  You have belly (abdominal) pain.  You feel like you may pass out (faint). MAKE SURE YOU:   Understand these instructions.  Will watch your condition.  Will get help right away if you are not  doing well or get worse. Document Released: 01/12/2008 Document Revised: 10/18/2011 Document Reviewed: 11/27/2013 [facility] Patient Information 2015 [employer] [place]. This information is not intended to replace advice given to you by your health care provider. Make sure you discuss any questions you have with your health care provider.

## 2014-10-16 ENCOUNTER — Encounter (HOSPITAL_COMMUNITY): Payer: Self-pay

## 2014-10-16 ENCOUNTER — Emergency Department (HOSPITAL_COMMUNITY): Payer: Self-pay

## 2014-10-16 ENCOUNTER — Emergency Department (HOSPITAL_COMMUNITY)
Admission: EM | Admit: 2014-10-16 | Discharge: 2014-10-16 | Disposition: A | Discharge status: Home / Self Care | Payer: Self-pay | Attending: Emergency Medicine | Admitting: Emergency Medicine

## 2014-10-16 DIAGNOSIS — Z79899 Other long term (current) drug therapy: Secondary | ICD-10-CM | POA: Insufficient documentation

## 2014-10-16 DIAGNOSIS — J45909 Unspecified asthma, uncomplicated: Secondary | ICD-10-CM | POA: Insufficient documentation

## 2014-10-16 DIAGNOSIS — Z8619 Personal history of other infectious and parasitic diseases: Secondary | ICD-10-CM | POA: Insufficient documentation

## 2014-10-16 DIAGNOSIS — N390 Urinary tract infection, site not specified: Secondary | ICD-10-CM | POA: Insufficient documentation

## 2014-10-16 DIAGNOSIS — Z792 Long term (current) use of antibiotics: Secondary | ICD-10-CM | POA: Insufficient documentation

## 2014-10-16 DIAGNOSIS — Z3202 Encounter for pregnancy test, result negative: Secondary | ICD-10-CM | POA: Insufficient documentation

## 2014-10-16 DIAGNOSIS — Z791 Long term (current) use of non-steroidal anti-inflammatories (NSAID): Secondary | ICD-10-CM | POA: Insufficient documentation

## 2014-10-16 DIAGNOSIS — G8929 Other chronic pain: Secondary | ICD-10-CM | POA: Insufficient documentation

## 2014-10-16 DIAGNOSIS — O26899 Other specified pregnancy related conditions, unspecified trimester: Secondary | ICD-10-CM

## 2014-10-16 DIAGNOSIS — R102 Pelvic and perineal pain: Secondary | ICD-10-CM

## 2014-10-16 DIAGNOSIS — F410 Panic disorder [episodic paroxysmal anxiety] without agoraphobia: Secondary | ICD-10-CM | POA: Insufficient documentation

## 2014-10-16 LAB — URINALYSIS, ROUTINE W REFLEX MICROSCOPIC
Bilirubin Urine: NEGATIVE
Glucose, UA: NEGATIVE mg/dL
Ketones, ur: NEGATIVE mg/dL
Nitrite: POSITIVE — AB
Protein, ur: NEGATIVE mg/dL
Specific Gravity, Urine: 1.025 (ref 1.005–1.030)
Urobilinogen, UA: 0.2 mg/dL (ref 0.0–1.0)
pH: 5.5 (ref 5.0–8.0)

## 2014-10-16 LAB — CBC WITH DIFFERENTIAL/PLATELET
Basophils Absolute: 0 10*3/uL (ref 0.0–0.1)
Basophils Relative: 0 % (ref 0–1)
Eosinophils Absolute: 0.1 10*3/uL (ref 0.0–0.7)
Eosinophils Relative: 1 % (ref 0–5)
HCT: 39.9 % (ref 36.0–46.0)
Hemoglobin: 13 g/dL (ref 12.0–15.0)
Lymphocytes Relative: 28 % (ref 12–46)
Lymphs Abs: 2.6 10*3/uL (ref 0.7–4.0)
MCH: 32.9 pg (ref 26.0–34.0)
MCHC: 32.6 g/dL (ref 30.0–36.0)
MCV: 101 fL — ABNORMAL HIGH (ref 78.0–100.0)
Monocytes Absolute: 0.6 10*3/uL (ref 0.1–1.0)
Monocytes Relative: 7 % (ref 3–12)
Neutro Abs: 5.8 10*3/uL (ref 1.7–7.7)
Neutrophils Relative %: 64 % (ref 43–77)
Platelets: 246 10*3/uL (ref 150–400)
RBC: 3.95 MIL/uL (ref 3.87–5.11)
RDW: 12.8 % (ref 11.5–15.5)
WBC: 9.2 10*3/uL (ref 4.0–10.5)

## 2014-10-16 LAB — URINE MICROSCOPIC-ADD ON

## 2014-10-16 LAB — WET PREP, GENITAL
Trich, Wet Prep: NONE SEEN
WBC, Wet Prep HPF POC: NONE SEEN
Yeast Wet Prep HPF POC: NONE SEEN

## 2014-10-16 LAB — PREGNANCY, URINE: Preg Test, Ur: NEGATIVE

## 2014-10-16 MED ORDER — CEPHALEXIN 500 MG PO CAPS: 500.0000 mg | ORAL_CAPSULE | Freq: Four times a day (QID) | ORAL | Status: DC

## 2014-10-16 MED ORDER — KETOROLAC TROMETHAMINE 30 MG/ML IJ SOLN
30.0000 mg | Freq: Once | INTRAMUSCULAR | Status: AC
  Administered 2014-10-16: 30 mg via INTRAMUSCULAR
  Filled 2014-10-16: qty 1

## 2014-10-16 MED ORDER — NAPROXEN 500 MG PO TABS: 500.0000 mg | ORAL_TABLET | Freq: Two times a day (BID) | ORAL | Status: DC

## 2014-10-16 NOTE — ED Provider Notes (Signed)
CSN: [id number]     Arrival date & time 10/16/14  1525 History   First MD Initiated Contact with Patient 10/16/14 1613     Chief Complaint  Patient presents with  . Pelvic Pain     (Consider location/radiation/quality/duration/timing/severity/associated sxs/prior Treatment) Patient is a 39 y.o. female presenting with pelvic pain. The history is provided by the patient.  Pelvic Pain This is a new problem. The current episode started in the past 7 days. The problem occurs constantly. The problem has been gradually worsening. Associated symptoms include chills and urinary symptoms. Pertinent negatives include no fever, headaches, nausea, rash or vomiting.   Susan [name] is a 39 y.o. [phone] who is not pregnant and presents to the ED with pelvic pain. The pain started a week ago and has gotten worse. She reports vaginal d/c with a bad odor. Hx of ectopic pregnancy, STD's and BV. She complains of a severe sharp and cramping pain and pain with intercourse.   Past Medical History  Diagnosis Date  . Ectopic pregnancy   . Asthma   . Trichomonas vaginitis   . BV (bacterial vaginosis) 03/06/2013  . Chronic dental pain   . Panic attacks   . Chronic pelvic pain in female    Past Surgical History  Procedure Laterality Date  . Ectopic pregnancy surgery     Family History  Problem Relation Age of Onset  . COPD Mother    History  Substance Use Topics  . Smoking status: Current Every Day Smoker -- 1.00 packs/day    Types: Cigarettes  . Smokeless tobacco: Never Used  . Alcohol Use: No   OB History    Gravida Para Term Preterm AB TAB SAB Ectopic Multiple Living   Review of Systems  Constitutional: Positive for chills. Negative for fever.  HENT: Negative.   Eyes: Negative.   Gastrointestinal: Negative for nausea and vomiting.  Genitourinary: Positive for frequency, vaginal discharge, pelvic pain and dyspareunia. Negative for dysuria (pressure), urgency and vaginal  bleeding.  Skin: Negative for rash.  Neurological: Negative for syncope and headaches.  Psychiatric/Behavioral: Negative for confusion. The patient is not nervous/anxious.       Allergies  Review of patient's allergies indicates no known allergies.  Home Medications   Prior to Admission medications   Medication Sig Start Date End Date Taking? Authorizing Provider  FLUoxetine (PROZAC) 20 MG capsule Take 60 mg by mouth daily.   Yes Historical Provider, MD  ibuprofen (ADVIL,MOTRIN) 200 MG tablet Take 200 mg by mouth every 6 (six) hours as needed for moderate pain.   Yes Historical Provider, MD  traZODone (DESYREL) 100 MG tablet Take 200 mg by mouth at bedtime.    Yes Historical Provider, MD  acetaminophen (TYLENOL) 500 MG tablet Take 1,000 mg by mouth every 6 (six) hours as needed for moderate pain.    Historical Provider, MD  amoxicillin (AMOXIL) 500 MG capsule Take 1 capsule (500 mg total) by mouth 3 (three) times daily. For 10 days Patient not taking: Reported on 06/21/2014 03/25/14   Tammi Triplett, PA-C  cyclobenzaprine (FLEXERIL) 10 MG tablet Take 1 tablet (10 mg total) by mouth 3 (three) times daily as needed. Patient not taking: Reported on 10/16/2014 09/05/14   Tammi Triplett, PA-C  diclofenac (VOLTAREN) 75 MG EC tablet Take 1 tablet (75 mg total) by mouth 2 (two) times daily. Take with food Patient not taking: Reported on 06/21/2014 03/25/14  Tammi Triplett, PA-C  doxycycline (VIBRA-TABS) 100 MG tablet Take 1 tablet (100 mg total) by mouth 2 (two) times daily. Patient not taking: Reported on 09/05/2014 07/26/14   Samuel JesterKathleen McManus, DO  FLUoxetine HCl 60 MG TABS Take 60 mg by mouth daily.    Historical Provider, MD  metroNIDAZOLE (FLAGYL) 500 MG tablet Take 1 tablet (500 mg total) by mouth 2 (two) times daily. Patient not taking: Reported on 09/05/2014 07/26/14   Samuel JesterKathleen McManus, DO  naproxen (NAPROSYN) 250 MG tablet Take 1 tablet (250 mg total) by mouth 2 (two) times daily as needed  for mild pain or moderate pain (take with food). Patient not taking: Reported on 09/05/2014 07/26/14   Samuel JesterKathleen McManus, DO  oxyCODONE-acetaminophen (PERCOCET/ROXICET) 5-325 MG per tablet Take 1 tablet by mouth every 4 (four) hours as needed. Patient not taking: Reported on 09/14/2014 09/05/14   Tammi Triplett, PA-C   BP 118/67 mmHg  Pulse 78  Temp(Src) 98.8 F (37.1 C) (Oral)  Resp 20  Wt 140 lb (63.504 kg)  SpO2 100%  LMP 10/02/2014 Physical Exam  Constitutional: She is oriented to person, place, and time. She appears well-developed and well-nourished.  HENT:  Head: Normocephalic.  Eyes: EOM are normal.  Neck: Neck supple.  Cardiovascular: Normal rate.   Pulmonary/Chest: Effort normal.  Abdominal: Soft. Bowel sounds are normal. There is tenderness in the right lower quadrant, suprapubic area and left lower quadrant. There is no rigidity, no rebound, no guarding and no CVA tenderness.  Genitourinary:  External genitalia without lesions. White d/c vaginal vault. Positive CMT, bilateral adnexal tenderness. Uterus without palpable enlargement.   Musculoskeletal: Normal range of motion.  Neurological: She is alert and oriented to person, place, and time. No cranial nerve deficit.  Skin: Skin is warm and dry.  Psychiatric: She has a normal mood and affect. Her behavior is normal.  Nursing note and vitals reviewed.   ED Course  Procedures (including critical care time) Labs, ultrasound, Toradol for pain,   Labs Review Results for orders placed or performed during the hospital encounter of 10/16/14 (from the past 24 hour(s))  Urinalysis, Routine w reflex microscopic     Status: Abnormal   Collection Time: 10/16/14  3:32 PM  Result Value Ref Range   Color, Urine YELLOW YELLOW   APPearance CLEAR CLEAR   Specific Gravity, Urine 1.025 1.005 - 1.030   pH 5.5 5.0 - 8.0   Glucose, UA NEGATIVE NEGATIVE mg/dL   Hgb urine dipstick SMALL (A) NEGATIVE   Bilirubin Urine NEGATIVE NEGATIVE    Ketones, ur NEGATIVE NEGATIVE mg/dL   Protein, ur NEGATIVE NEGATIVE mg/dL   Urobilinogen, UA 0.2 0.0 - 1.0 mg/dL   Nitrite POSITIVE (A) NEGATIVE   Leukocytes, UA SMALL (A) NEGATIVE  Pregnancy, urine     Status: None   Collection Time: 10/16/14  3:32 PM  Result Value Ref Range   Preg Test, Ur NEGATIVE NEGATIVE  Urine microscopic-add on     Status: Abnormal   Collection Time: 10/16/14  3:32 PM  Result Value Ref Range   Squamous Epithelial / LPF MANY (A) RARE   WBC, UA 11-20 <3 WBC/hpf   RBC / HPF 7-10 <3 RBC/hpf   Bacteria, UA MANY (A) RARE  Wet prep, genital     Status: Abnormal   Collection Time: 10/16/14  4:26 PM  Result Value Ref Range   Yeast Wet Prep HPF POC NONE SEEN NONE SEEN   Trich, Wet Prep NONE SEEN NONE SEEN   Clue  Cells Wet Prep HPF POC FEW (A) NONE SEEN   WBC, Wet Prep HPF POC NONE SEEN NONE SEEN    Imaging Review US Transvaginal Non-ob  10/16/2014   CLINICAL DATA:  Bilateral pelvic pain for 1 week.  EXAM: TRANSABDOMINAL AND TRANSVAGINAL ULTRASOUND OF PELVIS  TECHNIQUE: Both transabdominal and transvaginal ultrasound examinations of the pelvis were performed. Transabdominal technique was performed for global imaging of the pelvis including uterus, ovaries, adnexal regions, and pelvic cul-de-sac. It was necessary to proceed with endovaginal exam following the transabdominal exam to visualize the uterus and ovaries.  COMPARISON:  Ultrasound pelvis 07/26/2014.  FINDINGS: Uterus  Measurements: 7.6 x 3.8 x 4.8 cm, anteverted. No fibroids or other mass visualized.  Endometrium  Thickness: 8.8 mm.  No focal abnormality visualized.  Right ovary  Measurements: 4.2 x 2.5 x 3.2 cm. Normal appearance/no adnexal mass.  Left ovary  Measurements: 3.3 x 2 x 2.2 cm. Normal appearance of the left ovary. Small serpiginous fluid-filled tubular structure adjacent to the left ovary measures 1.3 x 1.3 cm and likely represents hydrosalpinx. Similar finding demonstrated on previous study.  Other  findings  No free fluid.  IMPRESSION: Normal ultrasound appearance of the uterus and ovaries. Small fluid-filled tubular structure adjacent to the left ovary likely represents hydrosalpinx. No change since previous study.   Electronically Signed   By: Burman Nieves M.D.   On: 10/16/2014 17:35   US Pelvis Complete  10/16/2014   CLINICAL DATA:  Bilateral pelvic pain for 1 week.  EXAM: TRANSABDOMINAL AND TRANSVAGINAL ULTRASOUND OF PELVIS  TECHNIQUE: Both transabdominal and transvaginal ultrasound examinations of the pelvis were performed. Transabdominal technique was performed for global imaging of the pelvis including uterus, ovaries, adnexal regions, and pelvic cul-de-sac. It was necessary to proceed with endovaginal exam following the transabdominal exam to visualize the uterus and ovaries.  COMPARISON:  Ultrasound pelvis 07/26/2014.  FINDINGS: Uterus  Measurements: 7.6 x 3.8 x 4.8 cm, anteverted. No fibroids or other mass visualized.  Endometrium  Thickness: 8.8 mm.  No focal abnormality visualized.  Right ovary  Measurements: 4.2 x 2.5 x 3.2 cm. Normal appearance/no adnexal mass.  Left ovary  Measurements: 3.3 x 2 x 2.2 cm. Normal appearance of the left ovary. Small serpiginous fluid-filled tubular structure adjacent to the left ovary measures 1.3 x 1.3 cm and likely represents hydrosalpinx. Similar finding demonstrated on previous study.  Other findings  No free fluid.  IMPRESSION: Normal ultrasound appearance of the uterus and ovaries. Small fluid-filled tubular structure adjacent to the left ovary likely represents hydrosalpinx. No change since previous study.   Electronically Signed   By: Burman Nieves M.D.   On: 10/16/2014 17:35     MDM  39 y.o. female with suprapubic pain and dyspareunia. Stable for d/c without fever, no torsion, no TOA. Will treat with antibiotics for UTI and send urine for culture. I have reviewed this patient's vital signs, nurses notes, appropriate labs and imaging.  I have  discussed findings and plan of care with the patient and she voices understanding and agrees with plan.  She will follow up with her GYN or return here as needed.      [address] [place], NP 10/17/14 1629  Rolland Porter, MD 10/20/14 [phone]

## 2014-10-16 NOTE — ED Notes (Signed)
Pt reports pain with urination and vaginal discharge with odor x 1 week.

## 2014-10-16 NOTE — Discharge Instructions (Signed)
Your ultrasound today is normal. Your urine shows that you have an infection. We are treating you with antibiotics and have sent the urine for culture. If we need to change your antibiotics when the culture is back someone will call you. Follow up with your doctor. Return here as needed.

## 2014-10-17 LAB — HIV ANTIBODY (ROUTINE TESTING W REFLEX): HIV Screen 4th Generation wRfx: NONREACTIVE

## 2014-10-17 LAB — GC/CHLAMYDIA PROBE AMP (~~LOC~~) NOT AT ARMC
Chlamydia: NEGATIVE
Neisseria Gonorrhea: NEGATIVE

## 2014-12-26 ENCOUNTER — Encounter (HOSPITAL_COMMUNITY): Payer: Self-pay | Admitting: *Deleted

## 2014-12-26 ENCOUNTER — Emergency Department (HOSPITAL_COMMUNITY): Payer: Medicaid Other

## 2014-12-26 ENCOUNTER — Emergency Department (HOSPITAL_COMMUNITY)
Admission: EM | Admit: 2014-12-26 | Discharge: 2014-12-26 | Disposition: A | Discharge status: Home / Self Care | Payer: Medicaid Other | Attending: Emergency Medicine | Admitting: Emergency Medicine

## 2014-12-26 DIAGNOSIS — G8929 Other chronic pain: Secondary | ICD-10-CM | POA: Insufficient documentation

## 2014-12-26 DIAGNOSIS — Z79899 Other long term (current) drug therapy: Secondary | ICD-10-CM | POA: Diagnosis not present

## 2014-12-26 DIAGNOSIS — R300 Dysuria: Secondary | ICD-10-CM | POA: Diagnosis not present

## 2014-12-26 DIAGNOSIS — J45909 Unspecified asthma, uncomplicated: Secondary | ICD-10-CM | POA: Insufficient documentation

## 2014-12-26 DIAGNOSIS — N898 Other specified noninflammatory disorders of vagina: Secondary | ICD-10-CM | POA: Insufficient documentation

## 2014-12-26 DIAGNOSIS — R109 Unspecified abdominal pain: Secondary | ICD-10-CM

## 2014-12-26 DIAGNOSIS — R1030 Lower abdominal pain, unspecified: Secondary | ICD-10-CM | POA: Diagnosis present

## 2014-12-26 DIAGNOSIS — F410 Panic disorder [episodic paroxysmal anxiety] without agoraphobia: Secondary | ICD-10-CM | POA: Diagnosis not present

## 2014-12-26 DIAGNOSIS — Z331 Pregnant state, incidental: Secondary | ICD-10-CM | POA: Diagnosis not present

## 2014-12-26 DIAGNOSIS — Z8719 Personal history of other diseases of the digestive system: Secondary | ICD-10-CM | POA: Insufficient documentation

## 2014-12-26 DIAGNOSIS — Z720 Tobacco use: Secondary | ICD-10-CM | POA: Diagnosis not present

## 2014-12-26 DIAGNOSIS — Z3490 Encounter for supervision of normal pregnancy, unspecified, unspecified trimester: Secondary | ICD-10-CM

## 2014-12-26 LAB — URINALYSIS, ROUTINE W REFLEX MICROSCOPIC
Bilirubin Urine: NEGATIVE
Glucose, UA: NEGATIVE mg/dL
Ketones, ur: NEGATIVE mg/dL
Leukocytes, UA: NEGATIVE
Nitrite: NEGATIVE
Protein, ur: NEGATIVE mg/dL
Specific Gravity, Urine: >1.03 — ABNORMAL HIGH (ref 1.005–1.030)
Urobilinogen, UA: 0.2 mg/dL (ref 0.0–1.0)
pH: 5.5 (ref 5.0–8.0)

## 2014-12-26 LAB — BASIC METABOLIC PANEL
Anion gap: 7 (ref 5–15)
BUN: 10 mg/dL (ref 6–20)
CO2: 22 mmol/L (ref 22–32)
Calcium: 8.4 mg/dL — ABNORMAL LOW (ref 8.9–10.3)
Chloride: 106 mmol/L (ref 101–111)
Creatinine, Ser: 0.59 mg/dL (ref 0.44–1.00)
GFR calc Af Amer: >60 mL/min (ref >60)
GFR calc non Af Amer: >60 mL/min (ref >60)
Glucose, Bld: 162 mg/dL — ABNORMAL HIGH (ref 65–99)
Potassium: 3.5 mmol/L (ref 3.5–5.1)
Sodium: 135 mmol/L (ref 135–145)

## 2014-12-26 LAB — CBC WITH DIFFERENTIAL/PLATELET
Basophils Absolute: 0 10*3/uL (ref 0.0–0.1)
Basophils Relative: 0 % (ref 0–1)
Eosinophils Absolute: 0.1 10*3/uL (ref 0.0–0.7)
Eosinophils Relative: 1 % (ref 0–5)
HCT: 34.9 % — ABNORMAL LOW (ref 36.0–46.0)
Hemoglobin: 11.4 g/dL — ABNORMAL LOW (ref 12.0–15.0)
Lymphocytes Relative: 25 % (ref 12–46)
Lymphs Abs: 2.7 10*3/uL (ref 0.7–4.0)
MCH: 32.8 pg (ref 26.0–34.0)
MCHC: 32.7 g/dL (ref 30.0–36.0)
MCV: 100.3 fL — ABNORMAL HIGH (ref 78.0–100.0)
Monocytes Absolute: 0.6 10*3/uL (ref 0.1–1.0)
Monocytes Relative: 6 % (ref 3–12)
Neutro Abs: 7.2 10*3/uL (ref 1.7–7.7)
Neutrophils Relative %: 68 % (ref 43–77)
Platelets: 247 10*3/uL (ref 150–400)
RBC: 3.48 MIL/uL — ABNORMAL LOW (ref 3.87–5.11)
RDW: 12.3 % (ref 11.5–15.5)
WBC: 10.6 10*3/uL — ABNORMAL HIGH (ref 4.0–10.5)

## 2014-12-26 LAB — PREGNANCY, URINE: Preg Test, Ur: POSITIVE — AB

## 2014-12-26 LAB — URINE MICROSCOPIC-ADD ON

## 2014-12-26 LAB — WET PREP, GENITAL
Clue Cells Wet Prep HPF POC: NONE SEEN
Trich, Wet Prep: NONE SEEN
Yeast Wet Prep HPF POC: NONE SEEN

## 2014-12-26 LAB — ABO/RH: ABO/RH(D): O POS

## 2014-12-26 LAB — HCG, QUANTITATIVE, PREGNANCY: hCG, Beta Chain, Quant, S: 8547 m[IU]/mL — ABNORMAL HIGH (ref <5)

## 2014-12-26 NOTE — Discharge Instructions (Signed)
Please call your doctor for a followup appointment within 24-48 hours. When you talk to your doctor please let them know that you were seen in the emergency department and have them acquire all of your records so that they can discuss the findings with you and formulate a treatment plan to fully care for your new and ongoing problems. ° °

## 2014-12-26 NOTE — ED Notes (Signed)
Lab at bedside

## 2014-12-26 NOTE — ED Notes (Signed)
Low abd pain, vag d/c  White.

## 2014-12-26 NOTE — ED Provider Notes (Signed)
CSN: [id number]     Arrival date & time 12/26/14  1209 History   First MD Initiated Contact with Patient 12/26/14 1458     Chief Complaint  Patient presents with  . Abdominal Pain     (Consider location/radiation/quality/duration/timing/severity/associated sxs/prior Treatment) HPI Comments: The pt is a 39 y/o female whose LMP was missed by 3 weeks - has hx of vag dc/ X 2 weeks and lower abd pain X 1 week - denies having home preg test - has white vag d/c with mild dysuria, no back pain and no f/cn/v or diarrhea.  Sx are constant, mild, no associated diarrhea.  Prior ectopic.  Patient is a 39 y.o. female presenting with abdominal pain. The history is provided by the patient.  Abdominal Pain   Past Medical History  Diagnosis Date  . Ectopic pregnancy   . Asthma   . Trichomonas vaginitis   . BV (bacterial vaginosis) 03/06/2013  . Chronic dental pain   . Panic attacks   . Chronic pelvic pain in female    Past Surgical History  Procedure Laterality Date  . Ectopic pregnancy surgery     Family History  Problem Relation Age of Onset  . COPD Mother    History  Substance Use Topics  . Smoking status: Current Every Day Smoker -- 1.00 packs/day    Types: Cigarettes  . Smokeless tobacco: Never Used  . Alcohol Use: No   OB History    Gravida Para Term Preterm AB TAB SAB Ectopic Multiple Living   Review of Systems  Gastrointestinal: Positive for abdominal pain.  All other systems reviewed and are negative.     Allergies  Review of patient's allergies indicates no known allergies.  Home Medications   Prior to Admission medications   Medication Sig Start Date End Date Taking? Authorizing Provider  FLUoxetine (PROZAC) 20 MG capsule Take 40 mg by mouth daily.    Yes Historical Provider, MD  traZODone (DESYREL) 100 MG tablet Take 200 mg by mouth at bedtime.    Yes Historical Provider, MD  cephALEXin (KEFLEX) 500 MG capsule Take 1 capsule (500 mg total)  by mouth 4 (four) times daily. Patient not taking: Reported on 12/26/2014 10/16/14   Janne Napoleon, NP  diclofenac (VOLTAREN) 75 MG EC tablet Take 1 tablet (75 mg total) by mouth 2 (two) times daily. Take with food Patient not taking: Reported on 06/21/2014 03/25/14   Tammy Triplett, PA-C  naproxen (NAPROSYN) 500 MG tablet Take 1 tablet (500 mg total) by mouth 2 (two) times daily. Patient not taking: Reported on 12/26/2014 10/16/14   Janne Napoleon, NP   BP 92/59 mmHg  Pulse 71  Temp(Src) 98.6 F (37 C) (Oral)  Resp 14  Ht  (1.549 m)  Wt 140 lb (63.504 kg)  BMI 26.47 kg/m2  SpO2 98%  LMP 11/08/2014 (Approximate) Physical Exam  Constitutional: She appears well-developed and well-nourished. No distress.  HENT:  Head: Normocephalic and atraumatic.  Mouth/Throat: Oropharynx is clear and moist. No oropharyngeal exudate.  Eyes: Conjunctivae and EOM are normal. Pupils are equal, round, and reactive to light. Right eye exhibits no discharge. Left eye exhibits no discharge. No scleral icterus.  Neck: Normal range of motion. Neck supple. No JVD present. No thyromegaly present.  Cardiovascular: Normal rate, regular rhythm, normal heart sounds and intact distal pulses.  Exam reveals no gallop and no friction rub.   No  murmur heard. Pulmonary/Chest: Effort normal and breath sounds normal. No respiratory distress. She has no wheezes. She has no rales.  Abdominal: Soft. Bowel sounds are normal. She exhibits no distension and no mass. There is tenderness ( minimal SP ttp, no guarding, no peritoneal signs).  Genitourinary:  Chaperone present for exam, normal-appearing external genitalia, minimal amount of white vaginal discharge, no odor, no cervical motion tenderness, no adnexal tenderness or masses, no bleeding, cervix closed  Musculoskeletal: Normal range of motion. She exhibits no edema or tenderness.  Lymphadenopathy:    She has no cervical adenopathy.  Neurological: She is alert. Coordination  normal.  Skin: Skin is warm and dry. No rash noted. No erythema.  Psychiatric: She has a normal mood and affect. Her behavior is normal.  Nursing note and vitals reviewed.   ED Course  Procedures (including critical care time) Labs Review Labs Reviewed  WET PREP, GENITAL - Abnormal; Notable for the following:    WBC, Wet Prep HPF POC FEW (*)    All other components within normal limits  URINALYSIS, ROUTINE W REFLEX MICROSCOPIC - Abnormal; Notable for the following:    Specific Gravity, Urine >1.030 (*)    Hgb urine dipstick TRACE (*)    All other components within normal limits  PREGNANCY, URINE - Abnormal; Notable for the following:    Preg Test, Ur POSITIVE (*)    All other components within normal limits  CBC WITH DIFFERENTIAL/PLATELET - Abnormal; Notable for the following:    WBC 10.6 (*)    RBC 3.48 (*)    Hemoglobin 11.4 (*)    HCT 34.9 (*)    MCV 100.3 (*)    All other components within normal limits  BASIC METABOLIC PANEL - Abnormal; Notable for the following:    Glucose, Bld 162 (*)    Calcium 8.4 (*)    All other components within normal limits  URINE MICROSCOPIC-ADD ON - Abnormal; Notable for the following:    Squamous Epithelial / LPF MANY (*)    All other components within normal limits  HCG, QUANTITATIVE, PREGNANCY - Abnormal; Notable for the following:    hCG, Beta Chain, Quant, S 8547 (*)    All other components within normal limits  HIV ANTIBODY (ROUTINE TESTING)  ABO/RH  GC/CHLAMYDIA PROBE AMP ([place])    Imaging Review [place] [facility] Comp Less 14 Wks  12/26/2014   CLINICAL DATA:  One week history of pelvic pain and white vaginal discharge ; patient is known to be early pregnant with positive beta HCG ; history of ectopic pregnancy  EXAM: OBSTETRIC <14 WK US AND TRANSVAGINAL OB US  TECHNIQUE: Both transabdominal and transvaginal ultrasound examinations were performed for complete evaluation of the gestation as well as the maternal uterus, adnexal regions, and  pelvic cul-de-sac. Transvaginal technique was performed to assess early pregnancy.  COMPARISON:  Pelvic ultrasound dated October 16, 2014  FINDINGS: Intrauterine gestational sac: Single, normal in shape.  Yolk sac:  Present  Embryo:  None visible  Cardiac Activity: None visible  Heart Rate: N/A.  Bpm  MSD: 10.3  mm   5 w   5  d  Maternal uterus/adnexae: No subchorionic hemorrhage is demonstrated. The right ovary is normal in size and exhibits a approximately 8.6 mm slightly hyperechoic focus which may reflect a corpus luteum cyst. The left ovary is not visualized. No left adnexal mass is demonstrated. There is a small amount of free pelvic fluid.  IMPRESSION: There is an intrauterine fluid collection which  likely reflects a gestational sac and yolk sac but no embryo is visible. The mean sac diameter corresponds to a 5 week 5 day gestational. The lack of a visible embryo may reflect an anembryonic pregnancy, recent partial miscarriage, or very early but normal normal pregnancy. There is no evidence of an ectopic pregnancy.  No tubo-ovarian abscess is demonstrated. The left ovary could not be demonstrated.  Obstetrical consultation and serial follow-up beta HCG determinations and follow-up ultrasound examinations are recommended.   Electronically Signed   By: David  SwazilandJordan M.D.   On: 12/26/2014 17:07   [place] [facility] Transvaginal  12/26/2014   CLINICAL DATA:  One week history of pelvic pain and white vaginal discharge ; patient is known to be early pregnant with positive beta HCG ; history of ectopic pregnancy  EXAM: OBSTETRIC <14 WK US AND TRANSVAGINAL OB US  TECHNIQUE: Both transabdominal and transvaginal ultrasound examinations were performed for complete evaluation of the gestation as well as the maternal uterus, adnexal regions, and pelvic cul-de-sac. Transvaginal technique was performed to assess early pregnancy.  COMPARISON:  Pelvic ultrasound dated October 16, 2014  FINDINGS: Intrauterine gestational sac: Single, normal  in shape.  Yolk sac:  Present  Embryo:  None visible  Cardiac Activity: None visible  Heart Rate: N/A.  Bpm  MSD: 10.3  mm   5 w   5  d  Maternal uterus/adnexae: No subchorionic hemorrhage is demonstrated. The right ovary is normal in size and exhibits a approximately 8.6 mm slightly hyperechoic focus which may reflect a corpus luteum cyst. The left ovary is not visualized. No left adnexal mass is demonstrated. There is a small amount of free pelvic fluid.  IMPRESSION: There is an intrauterine fluid collection which likely reflects a gestational sac and yolk sac but no embryo is visible. The mean sac diameter corresponds to a 5 week 5 day gestational. The lack of a visible embryo may reflect an anembryonic pregnancy, recent partial miscarriage, or very early but normal normal pregnancy. There is no evidence of an ectopic pregnancy.  No tubo-ovarian abscess is demonstrated. The left ovary could not be demonstrated.  Obstetrical consultation and serial follow-up beta HCG determinations and follow-up ultrasound examinations are recommended.   Electronically Signed   By: David  [place]Jordan M.D.   On: 12/26/2014 17:07     MDM   Final diagnoses:  Abdominal pain  Early stage of pregnancy    Overall well appaering, normla VS - needs OBUS to r/o ectopic with prior hx, labs, pelvic exam.  Hx of trich.  Labs unremarkable - pt informed of US results   Early preg likely, no ectopic.  Meds given in ED:  Medications - No data to display  New Prescriptions   No medications on file      Eber HongBrian Sadeen Wiegel, MD 12/26/14 1733

## 2014-12-27 LAB — HIV ANTIBODY (ROUTINE TESTING W REFLEX): HIV Screen 4th Generation wRfx: NONREACTIVE

## 2014-12-27 LAB — GC/CHLAMYDIA PROBE AMP (~~LOC~~) NOT AT ARMC
Chlamydia: NEGATIVE
Neisseria Gonorrhea: NEGATIVE

## 2015-01-03 ENCOUNTER — Other Ambulatory Visit: Payer: Self-pay | Admitting: Obstetrics and Gynecology

## 2015-01-03 DIAGNOSIS — O3680X0 Pregnancy with inconclusive fetal viability, not applicable or unspecified: Secondary | ICD-10-CM

## 2015-01-10 ENCOUNTER — Other Ambulatory Visit: Payer: Medicaid Other

## 2015-01-10 ENCOUNTER — Encounter: Payer: Self-pay | Admitting: *Deleted

## 2015-01-27 ENCOUNTER — Encounter: Payer: Self-pay | Admitting: *Deleted

## 2015-01-27 ENCOUNTER — Other Ambulatory Visit: Payer: Medicaid Other

## 2015-02-01 ENCOUNTER — Emergency Department (HOSPITAL_COMMUNITY)
Admission: EM | Admit: 2015-02-01 | Discharge: 2015-02-01 | Disposition: A | Discharge status: Home / Self Care | Payer: Medicaid Other | Attending: Emergency Medicine | Admitting: Emergency Medicine

## 2015-02-01 ENCOUNTER — Encounter (HOSPITAL_COMMUNITY): Payer: Self-pay

## 2015-02-01 ENCOUNTER — Emergency Department (HOSPITAL_COMMUNITY): Payer: Medicaid Other

## 2015-02-01 DIAGNOSIS — O99351 Diseases of the nervous system complicating pregnancy, first trimester: Secondary | ICD-10-CM | POA: Diagnosis not present

## 2015-02-01 DIAGNOSIS — J45909 Unspecified asthma, uncomplicated: Secondary | ICD-10-CM | POA: Insufficient documentation

## 2015-02-01 DIAGNOSIS — Z79899 Other long term (current) drug therapy: Secondary | ICD-10-CM | POA: Insufficient documentation

## 2015-02-01 DIAGNOSIS — Z8742 Personal history of other diseases of the female genital tract: Secondary | ICD-10-CM | POA: Diagnosis not present

## 2015-02-01 DIAGNOSIS — Z3A08 8 weeks gestation of pregnancy: Secondary | ICD-10-CM | POA: Insufficient documentation

## 2015-02-01 DIAGNOSIS — O9989 Other specified diseases and conditions complicating pregnancy, childbirth and the puerperium: Secondary | ICD-10-CM | POA: Diagnosis not present

## 2015-02-01 DIAGNOSIS — F410 Panic disorder [episodic paroxysmal anxiety] without agoraphobia: Secondary | ICD-10-CM | POA: Insufficient documentation

## 2015-02-01 DIAGNOSIS — Z8719 Personal history of other diseases of the digestive system: Secondary | ICD-10-CM | POA: Insufficient documentation

## 2015-02-01 DIAGNOSIS — M7981 Nontraumatic hematoma of soft tissue: Secondary | ICD-10-CM | POA: Diagnosis not present

## 2015-02-01 DIAGNOSIS — O99511 Diseases of the respiratory system complicating pregnancy, first trimester: Secondary | ICD-10-CM | POA: Diagnosis not present

## 2015-02-01 DIAGNOSIS — O99331 Smoking (tobacco) complicating pregnancy, first trimester: Secondary | ICD-10-CM | POA: Insufficient documentation

## 2015-02-01 DIAGNOSIS — G8929 Other chronic pain: Secondary | ICD-10-CM | POA: Diagnosis not present

## 2015-02-01 DIAGNOSIS — F17210 Nicotine dependence, cigarettes, uncomplicated: Secondary | ICD-10-CM | POA: Diagnosis not present

## 2015-02-01 DIAGNOSIS — M542 Cervicalgia: Secondary | ICD-10-CM | POA: Diagnosis not present

## 2015-02-01 DIAGNOSIS — O99341 Other mental disorders complicating pregnancy, first trimester: Secondary | ICD-10-CM | POA: Diagnosis not present

## 2015-02-01 MED ORDER — ACETAMINOPHEN 500 MG PO TABS: 500.0000 mg | ORAL_TABLET | Freq: Four times a day (QID) | ORAL | Status: DC | PRN
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
## 2015-02-01 NOTE — ED Notes (Signed)
Patient c/o posterior neck pain that started a week ago. Patient denies injury.

## 2015-02-01 NOTE — Discharge Instructions (Signed)
Cervical Strain and Sprain (Whiplash) with Rehab Cervical strain and sprain are injuries that commonly occur with "whiplash" injuries. Whiplash occurs when the neck is forcefully whipped backward or forward, such as during a motor vehicle accident or during contact sports. The muscles, ligaments, tendons, discs, and nerves of the neck are susceptible to injury when this occurs. RISK FACTORS Risk of having a whiplash injury increases if:  Osteoarthritis of the spine.  Situations that make head or neck accidents or trauma more likely.  High-risk sports (football, rugby, wrestling, hockey, auto racing, gymnastics, diving, contact karate, or boxing).  Poor strength and flexibility of the neck.  Previous neck injury.  Poor tackling technique.  Improperly fitted or padded equipment. SYMPTOMS   Pain or stiffness in the front or back of neck or both.  Symptoms may present immediately or up to 24 hours after injury.  Dizziness, headache, nausea, and vomiting.  Muscle spasm with soreness and stiffness in the neck.  Tenderness and swelling at the injury site. PREVENTION  Learn and use proper technique (avoid tackling with the head, spearing, and head-butting; use proper falling techniques to avoid landing on the head).  Warm up and stretch properly before activity.  Maintain physical fitness:  Strength, flexibility, and endurance.  Cardiovascular fitness.  Wear properly fitted and padded protective equipment, such as padded soft collars, for participation in contact sports. PROGNOSIS  Recovery from cervical strain and sprain injuries is dependent on the extent of the injury. These injuries are usually curable in 1 week to 3 months with appropriate treatment.  RELATED COMPLICATIONS   Temporary numbness and weakness may occur if the nerve roots are damaged, and this may persist until the nerve has completely healed.  Chronic pain due to frequent recurrence of  symptoms.  Prolonged healing, especially if activity is resumed too soon (before complete recovery). TREATMENT  Treatment initially involves the use of ice and medication to help reduce pain and inflammation. It is also important to perform strengthening and stretching exercises and modify activities that worsen symptoms so the injury does not get worse. These exercises may be performed at home or with a therapist. For patients who experience severe symptoms, a soft, padded collar may be recommended to be worn around the neck.  Improving your posture may help reduce symptoms. Posture improvement includes pulling your chin and abdomen in while sitting or standing. If you are sitting, sit in a firm chair with your buttocks against the back of the chair. While sleeping, try replacing your pillow with a small towel rolled to 2 inches in diameter, or use a cervical pillow or soft cervical collar. Poor sleeping positions delay healing.  For patients with nerve root damage, which causes numbness or weakness, the use of a cervical traction apparatus may be recommended. Surgery is rarely necessary for these injuries. However, cervical strain and sprains that are present at birth (congenital) may require surgery. MEDICATION   If pain medication is necessary, nonsteroidal anti-inflammatory medications, such as aspirin and ibuprofen, or other minor pain relievers, such as acetaminophen, are often recommended.  Do not take pain medication for 7 days before surgery.  Prescription pain relievers may be given if deemed necessary by your caregiver. Use only as directed and only as much as you need. HEAT AND COLD:   Cold treatment (icing) relieves pain and reduces inflammation. Cold treatment should be applied for 10 to 15 minutes every 2 to 3 hours for inflammation and pain and immediately after any activity that aggravates   your symptoms. Use ice packs or an ice massage.  Heat treatment may be used prior to  performing the stretching and strengthening activities prescribed by your caregiver, physical therapist, or athletic trainer. Use a heat pack or a warm soak. SEEK MEDICAL CARE IF:   Symptoms get worse or do not improve in 2 weeks despite treatment.  New, unexplained symptoms develop (drugs used in treatment may produce side effects). EXERCISES RANGE OF MOTION (ROM) AND STRETCHING EXERCISES - Cervical Strain and Sprain These exercises may help you when beginning to rehabilitate your injury. In order to successfully resolve your symptoms, you must improve your posture. These exercises are designed to help reduce the forward-head and rounded-shoulder posture which contributes to this condition. Your symptoms may resolve with or without further involvement from your physician, physical therapist or athletic trainer. While completing these exercises, remember:   Restoring tissue flexibility helps normal motion to return to the joints. This allows healthier, less painful movement and activity.  An effective stretch should be held for at least 20 seconds, although you may need to begin with shorter hold times for comfort.  A stretch should never be painful. You should only feel a gentle lengthening or release in the stretched tissue. STRETCH- Axial Extensors  Lie on your back on the floor. You may bend your knees for comfort. Place a rolled-up hand towel or dish towel, about 2 inches in diameter, under the part of your head that makes contact with the floor.  Gently tuck your chin, as if trying to make a "double chin," until you feel a gentle stretch at the base of your head.  Hold __________ seconds. Repeat __________ times. Complete this exercise __________ times per day.  STRETCH - Axial Extension   Stand or sit on a firm surface. Assume a good posture: chest up, shoulders drawn back, abdominal muscles slightly tense, knees unlocked (if standing) and feet hip width apart.  Slowly retract your  chin so your head slides back and your chin slightly lowers. Continue to look straight ahead.  You should feel a gentle stretch in the back of your head. Be certain not to feel an aggressive stretch since this can cause headaches later.  Hold for __________ seconds. Repeat __________ times. Complete this exercise __________ times per day. STRETCH - Cervical Side Bend   Stand or sit on a firm surface. Assume a good posture: chest up, shoulders drawn back, abdominal muscles slightly tense, knees unlocked (if standing) and feet hip width apart.  Without letting your nose or shoulders move, slowly tip your right / left ear to your shoulder until your feel a gentle stretch in the muscles on the opposite side of your neck.  Hold __________ seconds. Repeat __________ times. Complete this exercise __________ times per day. STRETCH - Cervical Rotators   Stand or sit on a firm surface. Assume a good posture: chest up, shoulders drawn back, abdominal muscles slightly tense, knees unlocked (if standing) and feet hip width apart.  Keeping your eyes level with the ground, slowly turn your head until you feel a gentle stretch along the back and opposite side of your neck.  Hold __________ seconds. Repeat __________ times. Complete this exercise __________ times per day. RANGE OF MOTION - Neck Circles   Stand or sit on a firm surface. Assume a good posture: chest up, shoulders drawn back, abdominal muscles slightly tense, knees unlocked (if standing) and feet hip width apart.  Gently roll your head down and around from the   back of one shoulder to the back of the other. The motion should never be forced or painful.  Repeat the motion 10-20 times, or until you feel the neck muscles relax and loosen. Repeat __________ times. Complete the exercise __________ times per day. STRENGTHENING EXERCISES - Cervical Strain and Sprain These exercises may help you when beginning to rehabilitate your injury. They may  resolve your symptoms with or without further involvement from your physician, physical therapist, or athletic trainer. While completing these exercises, remember:   Muscles can gain both the endurance and the strength needed for everyday activities through controlled exercises.  Complete these exercises as instructed by your physician, physical therapist, or athletic trainer. Progress the resistance and repetitions only as guided.  You may experience muscle soreness or fatigue, but the pain or discomfort you are trying to eliminate should never worsen during these exercises. If this pain does worsen, stop and make certain you are following the directions exactly. If the pain is still present after adjustments, discontinue the exercise until you can discuss the trouble with your clinician. STRENGTH - Cervical Flexors, Isometric  Face a wall, standing about 6 inches away. Place a small pillow, a ball about 6-8 inches in diameter, or a folded towel between your forehead and the wall.  Slightly tuck your chin and gently push your forehead into the soft object. Push only with mild to moderate intensity, building up tension gradually. Keep your jaw and forehead relaxed.  Hold 10 to 20 seconds. Keep your breathing relaxed.  Release the tension slowly. Relax your neck muscles completely before you start the next repetition. Repeat __________ times. Complete this exercise __________ times per day. STRENGTH- Cervical Lateral Flexors, Isometric   Stand about 6 inches away from a wall. Place a small pillow, a ball about 6-8 inches in diameter, or a folded towel between the side of your head and the wall.  Slightly tuck your chin and gently tilt your head into the soft object. Push only with mild to moderate intensity, building up tension gradually. Keep your jaw and forehead relaxed.  Hold 10 to 20 seconds. Keep your breathing relaxed.  Release the tension slowly. Relax your neck muscles completely  before you start the next repetition. Repeat __________ times. Complete this exercise __________ times per day. STRENGTH - Cervical Extensors, Isometric   Stand about 6 inches away from a wall. Place a small pillow, a ball about 6-8 inches in diameter, or a folded towel between the back of your head and the wall.  Slightly tuck your chin and gently tilt your head back into the soft object. Push only with mild to moderate intensity, building up tension gradually. Keep your jaw and forehead relaxed.  Hold 10 to 20 seconds. Keep your breathing relaxed.  Release the tension slowly. Relax your neck muscles completely before you start the next repetition. Repeat __________ times. Complete this exercise __________ times per day. POSTURE AND BODY MECHANICS CONSIDERATIONS - Cervical Strain and Sprain Keeping correct posture when sitting, standing or completing your activities will reduce the stress put on different body tissues, allowing injured tissues a chance to heal and limiting painful experiences. The following are general guidelines for improved posture. Your physician or physical therapist will provide you with any instructions specific to your needs. While reading these guidelines, remember:  The exercises prescribed by your provider will help you have the flexibility and strength to maintain correct postures.  The correct posture provides the optimal environment for your joints to   work. All of your joints have less wear and tear when properly supported by a spine with good posture. This means you will experience a healthier, less painful body.  Correct posture must be practiced with all of your activities, especially prolonged sitting and standing. Correct posture is as important when doing repetitive low-stress activities (typing) as it is when doing a single heavy-load activity (lifting). PROLONGED STANDING WHILE SLIGHTLY LEANING FORWARD When completing a task that requires you to lean  forward while standing in one place for a long time, place either foot up on a stationary 2- to 4-inch high object to help maintain the best posture. When both feet are on the ground, the low back tends to lose its slight inward curve. If this curve flattens (or becomes too large), then the back and your other joints will experience too much stress, fatigue more quickly, and can cause pain.  RESTING POSITIONS Consider which positions are most painful for you when choosing a resting position. If you have pain with flexion-based activities (sitting, bending, stooping, squatting), choose a position that allows you to rest in a less flexed posture. You would want to avoid curling into a fetal position on your side. If your pain worsens with extension-based activities (prolonged standing, working overhead), avoid resting in an extended position such as sleeping on your stomach. Most people will find more comfort when they rest with their spine in a more neutral position, neither too rounded nor too arched. Lying on a non-sagging bed on your side with a pillow between your knees, or on your back with a pillow under your knees will often provide some relief. Keep in mind, being in any one position for a prolonged period of time, no matter how correct your posture, can still lead to stiffness. WALKING Walk with an upright posture. Your ears, shoulders, and hips should all line up. OFFICE WORK When working at a desk, create an environment that supports good, upright posture. Without extra support, muscles fatigue and lead to excessive strain on joints and other tissues. CHAIR:  A chair should be able to slide under your desk when your back makes contact with the back of the chair. This allows you to work closely.  The chair's height should allow your eyes to be level with the upper part of your monitor and your hands to be slightly lower than your elbows.  Body position:  Your feet should make contact with the  floor. If this is not possible, use a foot rest.  Keep your ears over your shoulders. This will reduce stress on your neck and low back. Document Released: 07/26/2005 Document Revised: 12/10/2013 Document Reviewed: 11/07/2008 [employer] Patient Information 2015 [employer]. This information is not intended to replace advice given to you by your health care provider. Make sure you discuss any questions you have with your health care provider.  

## 2015-02-01 NOTE — ED Notes (Signed)
Patient states she is about 2 months pregnant. She does not know exact DOC, and has not been seen by OBGYN.

## 2015-02-04 ENCOUNTER — Other Ambulatory Visit: Payer: Medicaid Other

## 2015-02-07 ENCOUNTER — Encounter: Payer: Self-pay | Admitting: Obstetrics & Gynecology

## 2015-02-07 ENCOUNTER — Other Ambulatory Visit: Payer: Medicaid Other

## 2015-02-12 ENCOUNTER — Encounter (HOSPITAL_COMMUNITY): Payer: Self-pay | Admitting: Emergency Medicine

## 2015-02-12 ENCOUNTER — Emergency Department (HOSPITAL_COMMUNITY)
Admission: EM | Admit: 2015-02-12 | Discharge: 2015-02-13 | Disposition: A | Discharge status: Home / Self Care | Payer: Medicaid Other | Attending: Emergency Medicine | Admitting: Emergency Medicine

## 2015-02-12 DIAGNOSIS — O99331 Smoking (tobacco) complicating pregnancy, first trimester: Secondary | ICD-10-CM | POA: Diagnosis not present

## 2015-02-12 DIAGNOSIS — N760 Acute vaginitis: Secondary | ICD-10-CM

## 2015-02-12 DIAGNOSIS — O99341 Other mental disorders complicating pregnancy, first trimester: Secondary | ICD-10-CM | POA: Insufficient documentation

## 2015-02-12 DIAGNOSIS — Z3A10 10 weeks gestation of pregnancy: Secondary | ICD-10-CM | POA: Insufficient documentation

## 2015-02-12 DIAGNOSIS — O99511 Diseases of the respiratory system complicating pregnancy, first trimester: Secondary | ICD-10-CM | POA: Insufficient documentation

## 2015-02-12 DIAGNOSIS — F410 Panic disorder [episodic paroxysmal anxiety] without agoraphobia: Secondary | ICD-10-CM | POA: Insufficient documentation

## 2015-02-12 DIAGNOSIS — J45909 Unspecified asthma, uncomplicated: Secondary | ICD-10-CM | POA: Insufficient documentation

## 2015-02-12 DIAGNOSIS — O200 Threatened abortion: Secondary | ICD-10-CM | POA: Insufficient documentation

## 2015-02-12 DIAGNOSIS — G8929 Other chronic pain: Secondary | ICD-10-CM | POA: Insufficient documentation

## 2015-02-12 DIAGNOSIS — Z3490 Encounter for supervision of normal pregnancy, unspecified, unspecified trimester: Secondary | ICD-10-CM

## 2015-02-12 DIAGNOSIS — B9689 Other specified bacterial agents as the cause of diseases classified elsewhere: Secondary | ICD-10-CM

## 2015-02-12 DIAGNOSIS — O23591 Infection of other part of genital tract in pregnancy, first trimester: Secondary | ICD-10-CM | POA: Insufficient documentation

## 2015-02-12 DIAGNOSIS — O209 Hemorrhage in early pregnancy, unspecified: Secondary | ICD-10-CM | POA: Diagnosis present

## 2015-02-12 DIAGNOSIS — F17210 Nicotine dependence, cigarettes, uncomplicated: Secondary | ICD-10-CM | POA: Diagnosis not present

## 2015-02-12 LAB — COMPREHENSIVE METABOLIC PANEL
ALT: 11 U/L — ABNORMAL LOW (ref 14–54)
AST: 15 U/L (ref 15–41)
Albumin: 3.4 g/dL — ABNORMAL LOW (ref 3.5–5.0)
Alkaline Phosphatase: 58 U/L (ref 38–126)
Anion gap: 6 (ref 5–15)
BUN: 10 mg/dL (ref 6–20)
CO2: 26 mmol/L (ref 22–32)
Calcium: 8.6 mg/dL — ABNORMAL LOW (ref 8.9–10.3)
Chloride: 106 mmol/L (ref 101–111)
Creatinine, Ser: 0.52 mg/dL (ref 0.44–1.00)
GFR calc Af Amer: >60 mL/min (ref >60)
GFR calc non Af Amer: >60 mL/min (ref >60)
Glucose, Bld: 100 mg/dL — ABNORMAL HIGH (ref 65–99)
Potassium: 4.1 mmol/L (ref 3.5–5.1)
Sodium: 138 mmol/L (ref 135–145)
Total Bilirubin: 0.3 mg/dL (ref 0.3–1.2)
Total Protein: 5.9 g/dL — ABNORMAL LOW (ref 6.5–8.1)

## 2015-02-12 LAB — CBC WITH DIFFERENTIAL/PLATELET
Basophils Absolute: 0 10*3/uL (ref 0.0–0.1)
Basophils Relative: 0 % (ref 0–1)
Eosinophils Absolute: 0.1 10*3/uL (ref 0.0–0.7)
Eosinophils Relative: 1 % (ref 0–5)
HCT: 34.1 % — ABNORMAL LOW (ref 36.0–46.0)
Hemoglobin: 11.4 g/dL — ABNORMAL LOW (ref 12.0–15.0)
Lymphocytes Relative: 21 % (ref 12–46)
Lymphs Abs: 2.2 10*3/uL (ref 0.7–4.0)
MCH: 33.8 pg (ref 26.0–34.0)
MCHC: 33.4 g/dL (ref 30.0–36.0)
MCV: 101.2 fL — ABNORMAL HIGH (ref 78.0–100.0)
Monocytes Absolute: 0.4 10*3/uL (ref 0.1–1.0)
Monocytes Relative: 4 % (ref 3–12)
Neutro Abs: 7.7 10*3/uL (ref 1.7–7.7)
Neutrophils Relative %: 74 % (ref 43–77)
Platelets: 248 10*3/uL (ref 150–400)
RBC: 3.37 MIL/uL — ABNORMAL LOW (ref 3.87–5.11)
RDW: 12.3 % (ref 11.5–15.5)
WBC: 10.3 10*3/uL (ref 4.0–10.5)

## 2015-02-12 LAB — HCG, QUANTITATIVE, PREGNANCY: hCG, Beta Chain, Quant, S: 46535 m[IU]/mL — ABNORMAL HIGH (ref <5)

## 2015-02-12 MED ORDER — SODIUM CHLORIDE 0.9 % IV BOLUS (SEPSIS)
1000.0000 mL | Freq: Once | INTRAVENOUS | Status: AC
  Administered 2015-02-12: 1000 mL via INTRAVENOUS

## 2015-02-12 NOTE — ED Notes (Signed)
Pt states she "just urinated and cannot urinate."

## 2015-02-12 NOTE — ED Provider Notes (Addendum)
CSN: [id number]     Arrival date & time 02/12/15  1914 History  This chart was scribed for Devoria Albe, MD by Octavia Heir, ED Scribe. This patient was seen in room APA09/APA09 and the patient's care was started at 11:41 PM.    Chief Complaint  Patient presents with  . Vaginal Bleeding     The history is provided by the patient. No language interpreter was used.   HPI Comments: Susan [name] is a 39 y.o. female G7P3Ab3, states her LNP was in March, presents to the Emergency Department complaining of intermittent, gradual worsening vaginal bleeding onset yesterday evening. She notes having bright red blood while urinating, urinary frequency, lower abdominal pain, and vomiting once a day which she has had with the pregnancy. She states she felt dizzy and lightheaded. She denies seeing any blood in her underwear and does not have to wear a pad. Sometimes she states she sees a spot of blood in her underwear. Pt states her last normal period was in March and found out she was pregnant in April. She has not seen an OB/GYN yet. She missed her appt last week because of lack of transportation.  She notes her youngest child is 27 years old. . Pt denies dysuria. She does not know her blood type and denies being given Rhogam during pregnancies. She does not have a PCP. She smokes 2 packs of cigarettes a day.   PCP none OB Dr Emelda Fear  Past Medical History  Diagnosis Date  . Ectopic pregnancy   . Asthma   . Trichomonas vaginitis   . BV (bacterial vaginosis) 03/06/2013  . Chronic dental pain   . Panic attacks   . Chronic pelvic pain in female    Past Surgical History  Procedure Laterality Date  . Ectopic pregnancy surgery     Family History  Problem Relation Age of Onset  . COPD Mother    History  Substance Use Topics  . Smoking status: Current Every Day Smoker -- 1.00 packs/day    Types: Cigarettes  . Smokeless tobacco: Never Used  . Alcohol Use: No  unemployed Smokes 2 ppd Drinks ETOH  occassionally   OB History    Gravida Para Term Preterm AB TAB SAB Ectopic Multiple Living   Review of Systems  Gastrointestinal: Positive for abdominal pain.  Genitourinary: Positive for frequency and vaginal bleeding. Negative for dysuria.    A complete 10 system review of systems was obtained and all systems are negative except as noted in the HPI and PMH.    Allergies  Review of patient's allergies indicates no known allergies.  Home Medications   Prior to Admission medications   Medication Sig Start Date End Date Taking? Authorizing Provider  acetaminophen (TYLENOL) 500 MG tablet Take 1 tablet (500 mg total) by mouth every 6 (six) hours as needed for moderate pain. Patient taking differently: Take 500-1,000 mg by mouth every 6 (six) hours as needed for moderate pain.  02/01/15  Yes Fayrene Helper, PA-C  traZODone (DESYREL) 100 MG tablet Take 200 mg by mouth at bedtime.    Yes Historical Provider, MD  metroNIDAZOLE (METROGEL VAGINAL) 0.75 % vaginal gel Place 1 Applicatorful vaginally 2 (two) times daily. 02/13/15   Devoria Albe, MD   Triage vitals: BP 109/55 mmHg  Pulse 79  Temp(Src) 98.5 F (36.9 C) (Oral)  Resp 16  Ht  (1.549 m)  Wt 140  lb (63.504 kg)  BMI 26.47 kg/m2  SpO2 100%  LMP 12/02/2014  Vital signs normal   Physical Exam  Constitutional: She is oriented to person, place, and time. She appears well-developed and well-nourished.  Non-toxic appearance. She does not appear ill. No distress.  HENT:  Head: Normocephalic and atraumatic.  Right Ear: External ear normal.  Left Ear: External ear normal.  Nose: Nose normal. No mucosal edema or rhinorrhea.  Mouth/Throat: Oropharynx is clear and moist and mucous membranes are normal. No dental abscesses or uvula swelling.  Very poor dentition  Eyes: Conjunctivae and EOM are normal. Pupils are equal, round, and reactive to light.  Neck: Normal range of motion and full passive range of motion without  pain. Neck supple.  Cardiovascular: Normal rate, regular rhythm and normal heart sounds.  Exam reveals no gallop and no friction rub.   No murmur heard. Pulmonary/Chest: Effort normal and breath sounds normal. No respiratory distress. She has no wheezes. She has no rhonchi. She has no rales. She exhibits no tenderness and no crepitus.  Abdominal: Soft. Normal appearance and bowel sounds are normal. She exhibits no distension. There is tenderness. There is no rebound and no guarding.  Mild tenderness in suprapubic area  Genitourinary:  Normal external genitalia, there was no blood in the vault, uterus is slightly enlarged and nontender. Her ovaries are mildly tender without masses.  Musculoskeletal: Normal range of motion. She exhibits no edema or tenderness.  Moves all extremities well.   Neurological: She is alert and oriented to person, place, and time. She has normal strength. No cranial nerve deficit.  Skin: Skin is warm, dry and intact. No rash noted. No erythema. No pallor.  Psychiatric: She has a normal mood and affect. Her speech is normal and behavior is normal. Her mood appears not anxious.  Nursing note and vitals reviewed.   ED Course  Procedures  DIAGNOSTIC STUDIES: Oxygen Saturation is 100% on RA, normal by my interpretation.  COORDINATION OF CARE:  11:47 PM Discussed treatment plan which includes in and out cath, lab work with pt at bedside and pt agreed to plan.  Patient had pelvic ultrasound done on March 9 that was normal. She had a second pelvic ultrasound done on May 19 which showed a gestational sac consistent with a gestational age of [redacted] weeks 5 days.   I did not find the source of her bleeding on my exam tonight. She may be having a threatened miscarriage, I thought initially after talking her she may have had a urinary tract infection. She does have bacterial vaginosis and will be treated for that. She was strongly encouraged to call and get another appointment [facility] her prenatal care.   EMERGENCY DEPARTMENT US PREGNANCY "Study: Limited Ultrasound of the Pelvis"  INDICATIONS:Pregnancy(required) and Vaginal bleeding Multiple views of the uterus and pelvic cavity are obtained with a multi-frequency probe.  APPROACH:Transabdominal   PERFORMED BY: Myself  IMAGES ARCHIVED?: Yes  LIMITATIONS: Decompressed bladder  PREGNANCY FREE FLUID: None  PREGNANCY UTERUS FINDINGS:Uterus enlarged, fetus visualized, very active, +heart activity, baby was too active to do measurements.    Labs Review Results for orders placed or performed during the hospital encounter of 02/12/15  Wet prep, genital  Result Value Ref Range   Yeast Wet Prep HPF POC NONE SEEN NONE SEEN   Trich, Wet Prep NONE SEEN NONE SEEN   Clue Cells Wet Prep HPF POC MANY (A) NONE SEEN   WBC, Wet Prep HPF  POC MODERATE (A) NONE SEEN  CBC with Differential  Result Value Ref Range   WBC 10.3 4.0 - 10.5 K/uL   RBC 3.37 (L) 3.87 - 5.11 MIL/uL   Hemoglobin 11.4 (L) 12.0 - 15.0 g/dL   HCT 96.0 (L) 45.4 - 09.8 %   MCV 101.2 (H) 78.0 - 100.0 fL   MCH 33.8 26.0 - 34.0 pg   MCHC 33.4 30.0 - 36.0 g/dL   RDW 11.9 14.7 - 82.9 %   Platelets 248 150 - 400 K/uL   Neutrophils Relative % 74 43 - 77 %   Neutro Abs 7.7 1.7 - 7.7 K/uL   Lymphocytes Relative 21 12 - 46 %   Lymphs Abs 2.2 0.7 - 4.0 K/uL   Monocytes Relative 4 3 - 12 %   Monocytes Absolute 0.4 0.1 - 1.0 K/uL   Eosinophils Relative 1 0 - 5 %   Eosinophils Absolute 0.1 0.0 - 0.7 K/uL   Basophils Relative 0 0 - 1 %   Basophils Absolute 0.0 0.0 - 0.1 K/uL  Comprehensive metabolic panel  Result Value Ref Range   Sodium 138 135 - 145 mmol/L   Potassium 4.1 3.5 - 5.1 mmol/L   Chloride 106 101 - 111 mmol/L   CO2 26 22 - 32 mmol/L   Glucose, Bld 100 (H) 65 - 99 mg/dL   BUN 10 6 - 20 mg/dL   Creatinine, Ser 5.62 0.44 - 1.00 mg/dL   Calcium 8.6 (L) 8.9 - 10.3 mg/dL   Total Protein 5.9 (L) 6.5 - 8.1 g/dL   Albumin 3.4 (L)  3.5 - 5.0 g/dL   AST 15 15 - 41 U/L   ALT 11 (L) 14 - 54 U/L   Alkaline Phosphatase 58 38 - 126 U/L   Total Bilirubin 0.3 0.3 - 1.2 mg/dL   GFR calc non Af Amer >60 >60 mL/min   GFR calc Af Amer >60 >60 mL/min   Anion gap 6 5 - 15  hCG, quantitative, pregnancy  Result Value Ref Range   hCG, Beta Chain, Quant, S 46535 (H) <5 mIU/mL  Urinalysis, Routine w reflex microscopic (not at [facility])  Result Value Ref Range   Color, Urine YELLOW YELLOW   APPearance HAZY (A) CLEAR   Specific Gravity, Urine 1.020 1.005 - 1.030   pH 7.0 5.0 - 8.0   Glucose, UA NEGATIVE NEGATIVE mg/dL   Hgb urine dipstick NEGATIVE NEGATIVE   Bilirubin Urine NEGATIVE NEGATIVE   Ketones, ur NEGATIVE NEGATIVE mg/dL   Protein, ur NEGATIVE NEGATIVE mg/dL   Urobilinogen, UA 0.2 0.0 - 1.0 mg/dL   Nitrite NEGATIVE NEGATIVE   Leukocytes, UA NEGATIVE NEGATIVE  ABO/Rh  Result Value Ref Range   ABO/RH(D) O POS     Laboratory interpretation all normal except mild anemia, bacterial vaginosis   Imaging Review No results found.   EKG Interpretation None      MDM   Final diagnoses:  Pregnancy  BV (bacterial vaginosis)  Threatened abortion in early pregnancy    I personally performed the services described in this documentation, which was scribed in my presence. The recorded information has been reviewed and considered.  Devoria Albe, MD, Concha Pyo, MD 02/13/15 1308  Devoria Albe, MD 02/13/15 [phone]

## 2015-02-12 NOTE — ED Notes (Signed)
Pt c/o vaginal bleeding, dizziness and states she is about 3 months pregnant. Pt has had no obgyn care.

## 2015-02-13 LAB — URINALYSIS, ROUTINE W REFLEX MICROSCOPIC
Bilirubin Urine: NEGATIVE
Glucose, UA: NEGATIVE mg/dL
Hgb urine dipstick: NEGATIVE
Ketones, ur: NEGATIVE mg/dL
Leukocytes, UA: NEGATIVE
Nitrite: NEGATIVE
Protein, ur: NEGATIVE mg/dL
Specific Gravity, Urine: 1.02 (ref 1.005–1.030)
Urobilinogen, UA: 0.2 mg/dL (ref 0.0–1.0)
pH: 7 (ref 5.0–8.0)

## 2015-02-13 LAB — WET PREP, GENITAL
Trich, Wet Prep: NONE SEEN
Yeast Wet Prep HPF POC: NONE SEEN

## 2015-02-13 LAB — ABO/RH: ABO/RH(D): O POS

## 2015-02-13 MED ORDER — METRONIDAZOLE 0.75 % VA GEL: 1.0000 | Freq: Two times a day (BID) | VAGINAL | Status: DC

## 2015-02-13 NOTE — ED Notes (Signed)
Pelvic exam performed by Dr Lynelle DoctorKnapp, pt tolerated well,  '

## 2015-02-13 NOTE — Discharge Instructions (Signed)
You need to call [facility] to get another prenatal appointment. Let them know about your ED visit tonight. You have a minor infection in your vagina called bacterial vaginosis. Use the vaginal gel for this. Return to the ED if you have bleeding like a period, pass clots, get severe abdominal pain or feel worse. Avoid sex until you have your visit with the OB doctor in case you are trying to have a miscarriage.    Bacterial Vaginosis Bacterial vaginosis is an infection of the vagina. It happens when too many of certain germs (bacteria) grow in the vagina. HOME CARE  Take your medicine as told by your doctor.  Finish your medicine even if you start to feel better.  Do not have sex until you finish your medicine and are better.  Tell your sex partner that you have an infection. They should see their doctor for treatment.  Practice safe sex. Use condoms. Have only one sex partner. GET HELP IF:  You are not getting better after 3 days of treatment.  You have more grey fluid (discharge) coming from your vagina than before.  You have more pain than before.  You have a fever. MAKE SURE YOU:   Understand these instructions.  Will watch your condition.  Will get help right away if you are not doing well or get worse. Document Released: 05/04/2008 Document Revised: 05/16/2013 Document Reviewed: 03/07/2013 [facility] Patient Information 2015 [place], [place]. This information is not intended to replace advice given to you by your health care provider. Make sure you discuss any questions you have with your health care provider.  First Trimester of Pregnancy The first trimester of pregnancy is from week 1 until the end of week 12 (months 1 through 3). During this time, your baby will begin to develop inside you. At 6-8 weeks, the eyes and face are formed, and the heartbeat can be seen on ultrasound. At the end of 12 weeks, all the baby's organs are formed. Prenatal care is all the medical care  you receive before the birth of your baby. Make sure you get good prenatal care and follow all of your doctor's instructions. HOME CARE  Medicines  Take medicine only as told by your doctor. Some medicines are safe and some are not during pregnancy.  Take your prenatal vitamins as told by your doctor.  Take medicine that helps you poop (stool softener) as needed if your doctor says it is okay. Diet  Eat regular, healthy meals.  Your doctor will tell you the amount of weight gain that is right for you.  Avoid raw meat and uncooked cheese.  If you feel sick to your stomach (nauseous) or throw up (vomit):  Eat 4 or 5 small meals a day instead of 3 large meals.  Try eating a few soda crackers.  Drink liquids between meals instead of during meals.  If you have a hard time pooping (constipation):  Eat high-fiber foods like fresh vegetables, fruit, and whole grains.  Drink enough fluids to keep your pee (urine) clear or pale yellow. Activity and Exercise  Exercise only as told by your doctor. Stop exercising if you have cramps or pain in your lower belly (abdomen) or low back.  Try to avoid standing for long periods of time. Move your legs often if you must stand in one place for a long time.  Avoid heavy lifting.  Wear low-heeled shoes. Sit and stand up straight.  You can have sex unless your doctor tells  you not to. Relief of Pain or Discomfort  Wear a good support bra if your breasts are sore.  Take warm water baths (sitz baths) to soothe pain or discomfort caused by hemorrhoids. Use hemorrhoid cream if your doctor says it is okay.  Rest with your legs raised if you have leg cramps or low back pain.  Wear support hose if you have puffy, bulging veins (varicose veins) in your legs. Raise (elevate) your feet for 15 minutes, 3-4 times a day. Limit salt in your diet. Prenatal Care  Schedule your prenatal visits by the twelfth week of pregnancy.  Write down your  questions. Take them to your prenatal visits.  Keep all your prenatal visits as told by your doctor. Safety  Wear your seat belt at all times when driving.  Make a list of emergency phone numbers. The list should include numbers for family, friends, the hospital, and police and fire departments. General Tips  Ask your doctor for a referral to a local prenatal class. Begin classes no later than at the start of month 6 of your pregnancy.  Ask for help if you need counseling or help with nutrition. Your doctor can give you advice or tell you where to go for help.  Do not use hot tubs, steam rooms, or saunas.  Do not douche or use tampons or scented sanitary pads.  Do not cross your legs for long periods of time.  Avoid litter boxes and soil used by cats.  Avoid all smoking, herbs, and alcohol. Avoid drugs not approved by your doctor.  Visit your dentist. At home, brush your teeth with a soft toothbrush. Be gentle when you floss. GET HELP IF:  You are dizzy.  You have mild cramps or pressure in your lower belly.  You have a nagging pain in your belly area.  You continue to feel sick to your stomach, throw up, or have watery poop (diarrhea).  You have a bad smelling fluid coming from your vagina.  You have pain with peeing (urination).  You have increased puffiness (swelling) in your face, hands, legs, or ankles. GET HELP RIGHT AWAY IF:   You have a fever.  You are leaking fluid from your vagina.  You have spotting or bleeding from your vagina.  You have very bad belly cramping or pain.  You gain or lose weight rapidly.  You throw up blood. It may look like coffee grounds.  You are around people who have [place] measles, fifth disease, or chickenpox.  You have a very bad headache.  You have shortness of breath.  You have any kind of trauma, such as from a fall or a car accident. Document Released: 01/12/2008 Document Revised: 12/10/2013 Document Reviewed:  06/05/2013 [facility] Patient Information 2015 [place], [place]. This information is not intended to replace advice given to you by your health care provider. Make sure you discuss any questions you have with your health care provider.

## 2015-02-14 LAB — HIV ANTIBODY (ROUTINE TESTING W REFLEX): HIV Screen 4th Generation wRfx: NONREACTIVE

## 2015-02-14 LAB — RPR: RPR Ser Ql: NONREACTIVE

## 2015-02-14 LAB — GC/CHLAMYDIA PROBE AMP (~~LOC~~) NOT AT ARMC
Chlamydia: NEGATIVE
Neisseria Gonorrhea: NEGATIVE

## 2015-02-18 ENCOUNTER — Other Ambulatory Visit: Payer: Medicaid Other

## 2015-02-24 ENCOUNTER — Other Ambulatory Visit: Payer: Medicaid Other

## 2015-02-25 ENCOUNTER — Ambulatory Visit (INDEPENDENT_AMBULATORY_CARE_PROVIDER_SITE_OTHER): Payer: Medicaid Other

## 2015-02-25 ENCOUNTER — Other Ambulatory Visit: Payer: Medicaid Other

## 2015-02-25 ENCOUNTER — Other Ambulatory Visit: Payer: Self-pay | Admitting: Obstetrics and Gynecology

## 2015-02-25 DIAGNOSIS — O3680X0 Pregnancy with inconclusive fetal viability, not applicable or unspecified: Secondary | ICD-10-CM | POA: Diagnosis not present

## 2015-02-25 NOTE — Progress Notes (Signed)
US 14+4wks single IUP,pos fht 152bpm,ant pl,normal ov' s bilat,cx appears to be closed,edd 08/22/2015

## 2015-03-05 ENCOUNTER — Encounter: Payer: Medicaid Other | Admitting: Women's Health

## 2015-03-24 ENCOUNTER — Ambulatory Visit (INDEPENDENT_AMBULATORY_CARE_PROVIDER_SITE_OTHER): Payer: Medicaid Other | Admitting: Women's Health

## 2015-03-24 ENCOUNTER — Other Ambulatory Visit (HOSPITAL_COMMUNITY)
Admission: RE | Admit: 2015-03-24 | Discharge: 2015-03-24 | Disposition: A | Discharge status: Home / Self Care | Payer: Medicaid Other | Source: Ambulatory Visit | Attending: Obstetrics & Gynecology | Admitting: Obstetrics & Gynecology

## 2015-03-24 ENCOUNTER — Encounter: Payer: Self-pay | Admitting: Women's Health

## 2015-03-24 VITALS — BP 108/46 | HR 72 | Wt 135.0 lb

## 2015-03-24 DIAGNOSIS — F418 Other specified anxiety disorders: Secondary | ICD-10-CM | POA: Diagnosis not present

## 2015-03-24 DIAGNOSIS — Z331 Pregnant state, incidental: Secondary | ICD-10-CM | POA: Diagnosis not present

## 2015-03-24 DIAGNOSIS — O09529 Supervision of elderly multigravida, unspecified trimester: Secondary | ICD-10-CM | POA: Insufficient documentation

## 2015-03-24 DIAGNOSIS — Z113 Encounter for screening for infections with a predominantly sexual mode of transmission: Secondary | ICD-10-CM | POA: Insufficient documentation

## 2015-03-24 DIAGNOSIS — Z363 Encounter for antenatal screening for malformations: Secondary | ICD-10-CM

## 2015-03-24 DIAGNOSIS — K088 Other specified disorders of teeth and supporting structures: Secondary | ICD-10-CM

## 2015-03-24 DIAGNOSIS — Z3682 Encounter for antenatal screening for nuchal translucency: Secondary | ICD-10-CM

## 2015-03-24 DIAGNOSIS — F1190 Opioid use, unspecified, uncomplicated: Secondary | ICD-10-CM | POA: Insufficient documentation

## 2015-03-24 DIAGNOSIS — Z369 Encounter for antenatal screening, unspecified: Secondary | ICD-10-CM

## 2015-03-24 DIAGNOSIS — O99322 Drug use complicating pregnancy, second trimester: Secondary | ICD-10-CM | POA: Diagnosis not present

## 2015-03-24 DIAGNOSIS — Z01419 Encounter for gynecological examination (general) (routine) without abnormal findings: Secondary | ICD-10-CM | POA: Insufficient documentation

## 2015-03-24 DIAGNOSIS — Z1389 Encounter for screening for other disorder: Secondary | ICD-10-CM | POA: Diagnosis not present

## 2015-03-24 DIAGNOSIS — Z124 Encounter for screening for malignant neoplasm of cervix: Secondary | ICD-10-CM

## 2015-03-24 DIAGNOSIS — F17200 Nicotine dependence, unspecified, uncomplicated: Secondary | ICD-10-CM

## 2015-03-24 DIAGNOSIS — Z0189 Encounter for other specified special examinations: Secondary | ICD-10-CM

## 2015-03-24 DIAGNOSIS — F1490 Cocaine use, unspecified, uncomplicated: Secondary | ICD-10-CM

## 2015-03-24 DIAGNOSIS — O09522 Supervision of elderly multigravida, second trimester: Secondary | ICD-10-CM | POA: Diagnosis not present

## 2015-03-24 DIAGNOSIS — O09892 Supervision of other high risk pregnancies, second trimester: Secondary | ICD-10-CM | POA: Diagnosis not present

## 2015-03-24 DIAGNOSIS — K089 Disorder of teeth and supporting structures, unspecified: Secondary | ICD-10-CM | POA: Insufficient documentation

## 2015-03-24 DIAGNOSIS — Z1151 Encounter for screening for human papillomavirus (HPV): Secondary | ICD-10-CM | POA: Diagnosis present

## 2015-03-24 DIAGNOSIS — Z0283 Encounter for blood-alcohol and blood-drug test: Secondary | ICD-10-CM

## 2015-03-24 LAB — POCT URINALYSIS DIPSTICK
Blood, UA: NEGATIVE
Glucose, UA: NEGATIVE
Ketones, UA: NEGATIVE
Leukocytes, UA: NEGATIVE
Nitrite, UA: NEGATIVE
Protein, UA: NEGATIVE

## 2015-03-24 MED ORDER — CITRANATAL ASSURE 35-1 & 300 MG PO MISC
ORAL | Status: DC
Start: 2015-03-24 — End: 2015-12-29

## 2015-03-24 NOTE — Progress Notes (Signed)
Subjective:  Susan [name] is a 39 y.o. [phone] Caucasian female at [redacted]w[redacted]d by 14wk u/s, being seen today for her first obstetrical visit.  Her obstetrical history is significant for (1)smoker: 2ppd prior to pregnancy now down to 1ppd- states she can quit on her own, (2) cocaine use 5 days ago, denies etoh or thc or any other illicit drugs, (3) h/o term uncomplicated svb x 3- doesn't have custody of those children, tab x 2, ectopic x 1, (4) chronic opioid use: norco 7.5/325 4x/day rx'd by dr. Hilda Lias for chronic elbow pain/tendonitis for last 3 years, (5) AMA.  H/O depression/anxiety- was taking prozac and trazadone prior to pregnancy- quit both about 2-3 months ago when learned of pregnancy- feels like she is doing ok without- still has occasional mood swings. Pregnancy history fully reviewed.  Patient reports itchy rash all over- has been walking out in woods. Denies vb, cramping, uti s/s, abnormal/malodorous vag d/c, or vulvovaginal itching/irritation.  BP 108/46 mmHg  Pulse 72  Wt 135 lb (61.236 kg)  LMP 12/02/2014 (Approximate)  HISTORY: OB History  Gravida Para Term Preterm AB SAB TAB Ectopic Multiple Living  7 3 3  3  2 1  3     # Outcome Date GA Lbr Len/2nd Weight Sex Delivery Anes PTL Lv  7 Current           6 Term 11/07/05 [redacted]w[redacted]d  7 lb (3.175 kg) F Vag-Spont  N Y  5 Term 12/18/04 [redacted]w[redacted]d  7 lb 8 oz (3.402 kg) F Vag-Spont EPI N Y  4 Term 01/22/99 [redacted]w[redacted]d  7 lb 10 oz (3.459 kg) M Vag-Spont EPI N Y  3 TAB           2 TAB           1 Ectopic        N      Past Medical History  Diagnosis Date  . Ectopic pregnancy   . Asthma   . Trichomonas vaginitis   . BV (bacterial vaginosis) 03/06/2013  . Chronic dental pain   . Panic attacks   . Chronic pelvic pain in female   . Depression    Past Surgical History  Procedure Laterality Date  . Ectopic pregnancy surgery     Family History  Problem Relation Age of Onset  . COPD Mother   . Stroke Mother   . Heart disease Maternal  Grandmother   . Hypertension Maternal Grandmother   . Stroke Maternal Grandfather   . Asthma Sister   . Down syndrome Sister     Exam   System:     General: Well developed & nourished, no acute distress   Skin: Warm & dry, normal coloration and turgor, few scattered small scabby areas on arms/abd/legs- doesn't appear to be scabies- none in webbing of fingers   Neurologic: Alert & oriented, normal mood   Cardiovascular: Regular rate & rhythm   Respiratory: Effort & rate normal, LCTAB, acyanotic   Abdomen: Soft, non tender   Extremities: normal strength, tone   Pelvic Exam:    Perineum: Normal perineum   Vulva: Normal, no lesions   Vagina:  Normal mucosa, normal discharge   Cervix: Normal, bulbous, appears closed   Uterus: Normal size/shape/contour for GA   Thin prep pap smear obtained w/  high risk HPV cotesting FHR: 145 via doppler   Assessment:   Pregnancy: [id number] Patient Active Problem List   Diagnosis Date Noted  . Supervision of other high-risk pregnancy 03/24/2015  .  Smoker 03/24/2015  . Cocaine use complicating pregnancy 03/24/2015  . Chronic, continuous use of opioids 03/24/2015  . Depression with anxiety 03/24/2015  . CLOSED FRACTURE DISTAL PHALANX OR PHALANGES HAND 06/05/2009  . Sprain of ankle, unspecified site 05/28/2009    [redacted]w[redacted]d [id number] New OB visit Smoker Cocaine use Chronic opioid use Late to care Doesn't have custody of other children AMA Depression/anxiety  Plan:  Initial labs drawn Continue prenatal vitamins Problem list reviewed and updated Reviewed n/v relief measures and warning s/s to report Reviewed recommended weight gain based on pre-gravid BMI Encouraged well-balanced diet Genetic Screening discussed Quad Screen: requested Cystic fibrosis screening discussed declined Ultrasound discussed; fetal survey: requested Follow up in 1 weeks for anatomy u/s and visit w/ MD to discuss opioid use/develop plan [facility] completed Advised  complete cessation of smoking and cocaine use- states she can quit on her own, declined Quitline [place] for smoking  Marge Duncans CNM, [facility] 03/24/2015 11:58 AM

## 2015-03-24 NOTE — Patient Instructions (Signed)
Second Trimester of Pregnancy The second trimester is from week 13 through week 28, months 4 through 6. The second trimester is often a time when you feel your best. Your body has also adjusted to being pregnant, and you begin to feel better physically. Usually, morning sickness has lessened or quit completely, you may have more energy, and you may have an increase in appetite. The second trimester is also a time when the fetus is growing rapidly. At the end of the sixth month, the fetus is about 9 inches long and weighs about 1 pounds. You will likely begin to feel the baby move (quickening) between 18 and 20 weeks of the pregnancy. BODY CHANGES Your body goes through many changes during pregnancy. The changes vary from woman to woman.   Your weight will continue to increase. You will notice your lower abdomen bulging out.  You may begin to get stretch marks on your hips, abdomen, and breasts.  You may develop headaches that can be relieved by medicines approved by your health care provider.  You may urinate more often because the fetus is pressing on your bladder.  You may develop or continue to have heartburn as a result of your pregnancy.  You may develop constipation because certain hormones are causing the muscles that push waste through your intestines to slow down.  You may develop hemorrhoids or swollen, bulging veins (varicose veins).  You may have back pain because of the weight gain and pregnancy hormones relaxing your joints between the bones in your pelvis and as a result of a shift in weight and the muscles that support your balance.  Your breasts will continue to grow and be tender.  Your gums may bleed and may be sensitive to brushing and flossing.  Dark spots or blotches (chloasma, mask of pregnancy) may develop on your face. This will likely fade after the baby is born.  A dark line from your belly button to the pubic area (linea nigra) may appear. This will likely fade  after the baby is born.  You may have changes in your hair. These can include thickening of your hair, rapid growth, and changes in texture. Some women also have hair loss during or after pregnancy, or hair that feels dry or thin. Your hair will most likely return to normal after your baby is born. WHAT TO EXPECT AT YOUR PRENATAL VISITS During a routine prenatal visit:  You will be weighed to make sure you and the fetus are growing normally.  Your blood pressure will be taken.  Your abdomen will be measured to track your baby's growth.  The fetal heartbeat will be listened to.  Any test results from the previous visit will be discussed. Your health care provider may ask you:  How you are feeling.  If you are feeling the baby move.  If you have had any abnormal symptoms, such as leaking fluid, bleeding, severe headaches, or abdominal cramping.  If you have any questions. Other tests that may be performed during your second trimester include:  Blood tests that check for:  Low iron levels (anemia).  Gestational diabetes (between 24 and 28 weeks).  Rh antibodies.  Urine tests to check for infections, diabetes, or protein in the urine.  An ultrasound to confirm the proper growth and development of the baby.  An amniocentesis to check for possible genetic problems.  Fetal screens for spina bifida and Down syndrome. HOME CARE INSTRUCTIONS   Avoid all smoking, herbs, alcohol, and unprescribed   drugs. These chemicals affect the formation and growth of the baby.  Follow your health care provider's instructions regarding medicine use. There are medicines that are either safe or unsafe to take during pregnancy.  Exercise only as directed by your health care provider. Experiencing uterine cramps is a good sign to stop exercising.  Continue to eat regular, healthy meals.  Wear a good support bra for breast tenderness.  Do not use hot tubs, steam rooms, or saunas.  Wear your  seat belt at all times when driving.  Avoid raw meat, uncooked cheese, cat litter boxes, and soil used by cats. These carry germs that can cause birth defects in the baby.  Take your prenatal vitamins.  Try taking a stool softener (if your health care provider approves) if you develop constipation. Eat more high-fiber foods, such as fresh vegetables or fruit and whole grains. Drink plenty of fluids to keep your urine clear or pale yellow.  Take warm sitz baths to soothe any pain or discomfort caused by hemorrhoids. Use hemorrhoid cream if your health care provider approves.  If you develop varicose veins, wear support hose. Elevate your feet for 15 minutes, 3-4 times a day. Limit salt in your diet.  Avoid heavy lifting, wear low heel shoes, and practice good posture.  Rest with your legs elevated if you have leg cramps or low back pain.  Visit your dentist if you have not gone yet during your pregnancy. Use a soft toothbrush to brush your teeth and be gentle when you floss.  A sexual relationship may be continued unless your health care provider directs you otherwise.  Continue to go to all your prenatal visits as directed by your health care provider. SEEK MEDICAL CARE IF:   You have dizziness.  You have mild pelvic cramps, pelvic pressure, or nagging pain in the abdominal area.  You have persistent nausea, vomiting, or diarrhea.  You have a bad smelling vaginal discharge.  You have pain with urination. SEEK IMMEDIATE MEDICAL CARE IF:   You have a fever.  You are leaking fluid from your vagina.  You have spotting or bleeding from your vagina.  You have severe abdominal cramping or pain.  You have rapid weight gain or loss.  You have shortness of breath with chest pain.  You notice sudden or extreme swelling of your face, hands, ankles, feet, or legs.  You have not felt your baby move in over an hour.  You have severe headaches that do not go away with  medicine.  You have vision changes. Document Released: 07/20/2001 Document Revised: 07/31/2013 Document Reviewed: 09/26/2012 Exit[employer] Patient Information 2015 [employer]. This information is not intended to replace advice given to you by your health care provider. Make sure you discuss any questions you have with your health care provider.  

## 2015-03-25 LAB — PMP SCREEN PROFILE (10S), URINE
Amphetamine Screen, Ur: NEGATIVE ng/mL
Barbiturate Screen, Ur: NEGATIVE ng/mL
Benzodiazepine Screen, Urine: NEGATIVE ng/mL
Cannabinoids Ur Ql Scn: NEGATIVE ng/mL
Cocaine(Metab.)Screen, Urine: POSITIVE ng/mL
Creatinine(Crt), U: 217.2 mg/dL (ref 20.0–300.0)
Methadone Scn, Ur: NEGATIVE ng/mL
Opiate Scrn, Ur: POSITIVE ng/mL
Oxycodone+Oxymorphone Ur Ql Scn: NEGATIVE ng/mL
PCP Scrn, Ur: NEGATIVE ng/mL
Ph of Urine: 5.7 (ref 4.5–8.9)
Propoxyphene, Screen: NEGATIVE ng/mL

## 2015-03-25 LAB — ABO/RH: Rh Factor: POSITIVE

## 2015-03-25 LAB — CBC
Hematocrit: 33 % — ABNORMAL LOW (ref 34.0–46.6)
Hemoglobin: 10.8 g/dL — ABNORMAL LOW (ref 11.1–15.9)
MCH: 33.1 pg — ABNORMAL HIGH (ref 26.6–33.0)
MCHC: 32.7 g/dL (ref 31.5–35.7)
MCV: 101 fL — ABNORMAL HIGH (ref 79–97)
Platelets: 233 10*3/uL (ref 150–379)
RBC: 3.26 x10E6/uL — ABNORMAL LOW (ref 3.77–5.28)
RDW: 12.7 % (ref 12.3–15.4)
WBC: 10 10*3/uL (ref 3.4–10.8)

## 2015-03-25 LAB — URINALYSIS, ROUTINE W REFLEX MICROSCOPIC
Bilirubin, UA: NEGATIVE
Glucose, UA: NEGATIVE
Ketones, UA: NEGATIVE
Leukocytes, UA: NEGATIVE
Nitrite, UA: NEGATIVE
RBC, UA: NEGATIVE
Specific Gravity, UA: >=1.03 — AB (ref 1.005–1.030)
Urobilinogen, Ur: 0.2 mg/dL (ref 0.2–1.0)
pH, UA: 6 (ref 5.0–7.5)

## 2015-03-25 LAB — RPR: RPR Ser Ql: NONREACTIVE

## 2015-03-25 LAB — ANTIBODY SCREEN: Antibody Screen: NEGATIVE

## 2015-03-25 LAB — CYTOLOGY - PAP

## 2015-03-25 LAB — VARICELLA ZOSTER ANTIBODY, IGG: Varicella zoster IgG: 883 index (ref >165)

## 2015-03-25 LAB — HIV ANTIBODY (ROUTINE TESTING W REFLEX): HIV Screen 4th Generation wRfx: NONREACTIVE

## 2015-03-25 LAB — RUBELLA SCREEN: Rubella Antibodies, IGG: >33 index (ref >0.99)

## 2015-03-25 LAB — HEPATITIS B SURFACE ANTIGEN: Hepatitis B Surface Ag: NEGATIVE

## 2015-03-26 ENCOUNTER — Encounter: Payer: Self-pay | Admitting: Women's Health

## 2015-03-26 DIAGNOSIS — O285 Abnormal chromosomal and genetic finding on antenatal screening of mother: Secondary | ICD-10-CM | POA: Insufficient documentation

## 2015-03-26 LAB — AFP, QUAD SCREEN
DIA Mom Value: 0.59
DIA Value (EIA): 112.02 pg/mL
DSR (By Age)    1 IN: 147
DSR (Second Trimester) 1 IN: 408
Gestational Age: 18.4 WEEKS
MSAFP Mom: 1.51
MSAFP: 72 ng/mL
MSHCG Mom: 0.44
MSHCG: 12418 m[IU]/mL
Maternal Age At EDD: 38 YEARS
Osb Risk: 2661
T18 (By Age): 1:574 {titer}
Test Results:: POSITIVE — AB
Tri 18 Risk At Term: 1:47 {titer}
Weight: 135 [lb_av]
uE3 Mom: 0.51
uE3 Value: 0.79 ng/mL

## 2015-03-26 LAB — URINE CULTURE

## 2015-03-29 ENCOUNTER — Emergency Department (HOSPITAL_COMMUNITY)
Admission: EM | Admit: 2015-03-29 | Discharge: 2015-03-29 | Disposition: A | Discharge status: Home / Self Care | Payer: Medicaid Other | Attending: Emergency Medicine | Admitting: Emergency Medicine

## 2015-03-29 ENCOUNTER — Encounter (HOSPITAL_COMMUNITY): Payer: Self-pay

## 2015-03-29 DIAGNOSIS — Z8659 Personal history of other mental and behavioral disorders: Secondary | ICD-10-CM | POA: Diagnosis not present

## 2015-03-29 DIAGNOSIS — J45909 Unspecified asthma, uncomplicated: Secondary | ICD-10-CM | POA: Diagnosis not present

## 2015-03-29 DIAGNOSIS — R300 Dysuria: Secondary | ICD-10-CM | POA: Insufficient documentation

## 2015-03-29 DIAGNOSIS — F17210 Nicotine dependence, cigarettes, uncomplicated: Secondary | ICD-10-CM | POA: Insufficient documentation

## 2015-03-29 DIAGNOSIS — O99332 Smoking (tobacco) complicating pregnancy, second trimester: Secondary | ICD-10-CM | POA: Insufficient documentation

## 2015-03-29 DIAGNOSIS — R339 Retention of urine, unspecified: Secondary | ICD-10-CM | POA: Insufficient documentation

## 2015-03-29 DIAGNOSIS — G8929 Other chronic pain: Secondary | ICD-10-CM | POA: Diagnosis not present

## 2015-03-29 DIAGNOSIS — O99512 Diseases of the respiratory system complicating pregnancy, second trimester: Secondary | ICD-10-CM | POA: Insufficient documentation

## 2015-03-29 DIAGNOSIS — O99352 Diseases of the nervous system complicating pregnancy, second trimester: Secondary | ICD-10-CM | POA: Diagnosis not present

## 2015-03-29 DIAGNOSIS — Z79899 Other long term (current) drug therapy: Secondary | ICD-10-CM | POA: Diagnosis not present

## 2015-03-29 DIAGNOSIS — R109 Unspecified abdominal pain: Secondary | ICD-10-CM | POA: Diagnosis not present

## 2015-03-29 DIAGNOSIS — O210 Mild hyperemesis gravidarum: Secondary | ICD-10-CM | POA: Insufficient documentation

## 2015-03-29 DIAGNOSIS — Z3A16 16 weeks gestation of pregnancy: Secondary | ICD-10-CM | POA: Diagnosis not present

## 2015-03-29 DIAGNOSIS — R21 Rash and other nonspecific skin eruption: Secondary | ICD-10-CM | POA: Insufficient documentation

## 2015-03-29 DIAGNOSIS — O9989 Other specified diseases and conditions complicating pregnancy, childbirth and the puerperium: Secondary | ICD-10-CM | POA: Diagnosis not present

## 2015-03-29 DIAGNOSIS — R3919 Other difficulties with micturition: Secondary | ICD-10-CM | POA: Diagnosis not present

## 2015-03-29 DIAGNOSIS — R112 Nausea with vomiting, unspecified: Secondary | ICD-10-CM

## 2015-03-29 LAB — I-STAT CHEM 8, ED
BUN: 8 mg/dL (ref 6–20)
Calcium, Ion: 1.25 mmol/L — ABNORMAL HIGH (ref 1.12–1.23)
Chloride: 104 mmol/L (ref 101–111)
Creatinine, Ser: 0.7 mg/dL (ref 0.44–1.00)
Glucose, Bld: 83 mg/dL (ref 65–99)
HCT: 31 % — ABNORMAL LOW (ref 36.0–46.0)
Hemoglobin: 10.5 g/dL — ABNORMAL LOW (ref 12.0–15.0)
Potassium: 3.8 mmol/L (ref 3.5–5.1)
Sodium: 139 mmol/L (ref 135–145)
TCO2: 21 mmol/L (ref 0–100)

## 2015-03-29 LAB — URINALYSIS, ROUTINE W REFLEX MICROSCOPIC
Bilirubin Urine: NEGATIVE
Glucose, UA: NEGATIVE mg/dL
Hgb urine dipstick: NEGATIVE
Leukocytes, UA: NEGATIVE
Nitrite: NEGATIVE
Protein, ur: NEGATIVE mg/dL
Specific Gravity, Urine: 1.02 (ref 1.005–1.030)
Urobilinogen, UA: 0.2 mg/dL (ref 0.0–1.0)
pH: 6.5 (ref 5.0–8.0)

## 2015-03-29 MED ORDER — ONDANSETRON 4 MG PO TBDP: 4.0000 mg | ORAL_TABLET | Freq: Once | ORAL | Status: DC

## 2015-03-29 MED ORDER — TRIAMCINOLONE ACETONIDE 0.1 % EX CREA: 1.0000 "application " | TOPICAL_CREAM | Freq: Four times a day (QID) | CUTANEOUS | Status: DC | PRN

## 2015-03-29 MED ORDER — ONDANSETRON 4 MG PO TBDP
4.0000 mg | ORAL_TABLET | Freq: Once | ORAL | Status: AC
  Administered 2015-03-29: 4 mg via ORAL
  Filled 2015-03-29: qty 1

## 2015-03-29 NOTE — ED Notes (Signed)
Patient c/o of constant pressure in her lower abdominal/bladder area, with difficultly urinating and burning with urination. Patient states she is [redacted] weeks pregnant, normal pregnancy so far. 3rd pregnancy, one ectopic pregnant. Fetal Heart rate 161.

## 2015-03-29 NOTE — ED Notes (Signed)
Soda and graham crackers and peanut butter given to pt upon request.

## 2015-03-29 NOTE — ED Provider Notes (Signed)
CSN: [id number]     Arrival date & time 03/29/15  1229 History  This chart was scribed for Marily Memos, MD by Ronney Lion, ED Scribe. This patient was seen in room APA01/APA01 and the patient's care was started at 12:37 PM.    Chief Complaint  Patient presents with  . Urinary Retention   Patient is a 39 y.o. female presenting with abdominal pain. The history is provided by the patient. No language interpreter was used.  Abdominal Pain Pain location:  Suprapubic Pain quality: pressure   Pain radiates to:  Does not radiate Pain severity:  Moderate Onset quality:  Gradual Timing:  Constant Progression:  Unchanged Chronicity:  Recurrent Relieved by:  None tried Worsened by:  Nothing tried Ineffective treatments:  None tried Associated symptoms: dysuria, nausea and vomiting   Associated symptoms: no vaginal bleeding and no vaginal discharge     HPI Comments: Susan [name] is a 39 y.o. female [phone]) currently [redacted] weeks pregnant, who presents to the Emergency Department complaining of constant, moderate, worsening suprapubic pressure that onset over the past few days. She also complains of difficulty urinating, burning dysuria, nausea, and 2 episodes of vomiting. Patient states she has a history of one ectopic pregnancy and states this feels similar. She also has a history of a UTI and states the dysuria feels similar. She had an U/S done when she saw her OB/GYN done about 5 days ago which showed a normal pregnancy so far. She reports good fetal movement. She denies any vaginal bleeding recently, or any fluid leaking from her vagina. Patient states her LMP was 12/02/14.   Patient also complains of generalized, blistering, pruritic rash spreading through her body. She has tried applying hydrocortisone with no relief.    Past Medical History  Diagnosis Date  . Ectopic pregnancy   . Asthma   . Trichomonas vaginitis   . BV (bacterial vaginosis) 03/06/2013  . Chronic dental pain   . Panic  attacks   . Chronic pelvic pain in female   . Depression    Past Surgical History  Procedure Laterality Date  . Ectopic pregnancy surgery     Family History  Problem Relation Age of Onset  . COPD Mother   . Stroke Mother   . Heart disease Maternal Grandmother   . Hypertension Maternal Grandmother   . Stroke Maternal Grandfather   . Asthma Sister   . Down syndrome Sister    Social History  Substance Use Topics  . Smoking status: Current Every Day Smoker -- 1.00 packs/day    Types: Cigarettes  . Smokeless tobacco: Never Used  . Alcohol Use: No   OB History    Gravida Para Term Preterm AB TAB SAB Ectopic Multiple Living   7 3 3  3 2  1  3      Review of Systems  Gastrointestinal: Positive for nausea, vomiting and abdominal pain.  Genitourinary: Positive for dysuria and difficulty urinating. Negative for vaginal bleeding and vaginal discharge.  Skin: Positive for rash.  All other systems reviewed and are negative.     Allergies  Review of patient's allergies indicates no known allergies.  Home Medications   Prior to Admission medications   Medication Sig Start Date End Date Taking? Authorizing Provider  albuterol (PROVENTIL HFA;VENTOLIN HFA) 108 (90 BASE) MCG/ACT inhaler Inhale 2 puffs into the lungs every 6 (six) hours as needed for wheezing or shortness of breath.   Yes Historical Provider, MD  Prenat w/o A-FeCbGl-DSS-FA-DHA (CITRANATAL ASSURE)  35-1 & 300 MG tablet One tablet and one capsule daily Patient taking differently: Take 1 tablet by mouth daily. One tablet and one capsule daily 03/24/15  Yes Cheral Marker, CNM  ondansetron (ZOFRAN-ODT) 4 MG disintegrating tablet Take 1 tablet (4 mg total) by mouth once. 03/29/15   Marily Memos, MD  triamcinolone cream (KENALOG) 0.1 % Apply 1 application topically 4 (four) times daily as needed. 03/29/15   Marily Memos, MD   BP 108/63 mmHg  Pulse 69  Temp(Src) 98.2 F (36.8 C) (Oral)  Resp 18  Ht 5\' 1"  (1.549 m)  Wt 140  lb (63.504 kg)  BMI 26.47 kg/m2  SpO2 100%  LMP 12/02/2014 (Approximate) Physical Exam  Constitutional: She is oriented to person, place, and time. She appears well-developed and well-nourished. No distress.  HENT:  Head: Normocephalic and atraumatic.  Eyes: Conjunctivae and EOM are normal.  Neck: Neck supple. No tracheal deviation present.  Cardiovascular: Normal rate.   Pulmonary/Chest: Effort normal. No respiratory distress.  Abdominal: There is no tenderness.  No significant tenderness in suprapubic noted on exam.  Musculoskeletal: Normal range of motion.  Neurological: She is alert and oriented to person, place, and time.  Skin: Skin is warm and dry. Rash noted. Rash is vesicular.  Small vesicles on extremities with some excoriations.   Psychiatric: She has a normal mood and affect. Her behavior is normal.  Nursing note and vitals reviewed.   ED Course  Procedures (including critical care time)  EMERGENCY DEPARTMENT [place] PREGNANCY "Study: Limited Ultrasound of the Pelvis for Pregnancy"  INDICATIONS:Pregnancy(required) and Pelvic pain Multiple views of the uterus and pelvic cavity were obtained in real-time with a multi-frequency probe.  APPROACH:Transabdominal   PERFORMED BY: Myself  IMAGES ARCHIVED?: Yes  LIMITATIONS: Body habitus  PREGNANCY FREE FLUID: None  ADNEXAL FINDINGS:Left ovary not seen and Right ovary not seen  PREGNANCY FINDINGS: Fetal heart activity seen  INTERPRETATION: Viable intrauterine pregnancy and Pelvic free fluid absent  GESTATIONAL AGE, ESTIMATE: 17  FETAL HEART RATE: 141  CPT Codes:  81191-47 (transabdominal OB)  768-26-52 (transvaginal OB, Reduced level of service for incomplete exam)   DIAGNOSTIC STUDIES: Oxygen Saturation is 98% on RA, normal by my interpretation.    COORDINATION OF CARE: 1:17 PM - Discussed treatment plan with pt at bedside which includes checking urine and basic labs. Will also Rx triamcinolone for rash. Pt  verbalized understanding and agreed to plan.   Labs Review Labs Reviewed  URINALYSIS, ROUTINE W REFLEX MICROSCOPIC (NOT AT [facility]) - Abnormal; Notable for the following:    Ketones, ur TRACE (*)    All other components within normal limits  I-STAT CHEM 8, ED - Abnormal; Notable for the following:    Calcium, Ion 1.25 (*)    Hemoglobin 10.5 (*)    HCT 31.0 (*)    All other components within normal limits    MDM   Final diagnoses:  Rash  Non-intractable vomiting with nausea, vomiting of unspecified type   39 year old [id number] at approximately 17 weeks presents to the emergency room today with lower abdominal pain and urinary frequency. Initial concern was for possible urinary tract infection which was negative. Transabdominal ultrasound done and documented as above with a intrauterine pregnancy with a good heart rate. Symptoms may be related to normal pregnancy. She has any vaginal bleeding, vaginal discharge, diarrhea or constipation. She has a follow-up appointment on Monday and she will see her obstetrician that time for continued management.  I have personally and contemperaneously  reviewed labs and imaging and used in my decision making as above.   A medical screening exam was performed and I feel the patient has had an appropriate workup for their chief complaint at this time and likelihood of emergent condition existing is low. They have been counseled on decision, discharge, follow up and which symptoms necessitate immediate return to the emergency department. They or their family verbally stated understanding and agreement with plan and discharged in stable condition.    I personally performed the services described in this documentation, which was scribed in my presence. The recorded information has been reviewed and is accurate.      Marily Memos, MD 03/29/15 2234

## 2015-03-31 ENCOUNTER — Encounter: Payer: Self-pay | Admitting: Obstetrics & Gynecology

## 2015-03-31 ENCOUNTER — Ambulatory Visit (INDEPENDENT_AMBULATORY_CARE_PROVIDER_SITE_OTHER): Payer: Medicaid Other

## 2015-03-31 ENCOUNTER — Ambulatory Visit (INDEPENDENT_AMBULATORY_CARE_PROVIDER_SITE_OTHER): Payer: Medicaid Other | Admitting: Obstetrics & Gynecology

## 2015-03-31 VITALS — BP 90/60 | HR 72 | Wt 136.0 lb

## 2015-03-31 DIAGNOSIS — F1490 Cocaine use, unspecified, uncomplicated: Secondary | ICD-10-CM

## 2015-03-31 DIAGNOSIS — O99322 Drug use complicating pregnancy, second trimester: Secondary | ICD-10-CM

## 2015-03-31 DIAGNOSIS — Q913 Trisomy 18, unspecified: Secondary | ICD-10-CM | POA: Diagnosis not present

## 2015-03-31 DIAGNOSIS — Z363 Encounter for antenatal screening for malformations: Secondary | ICD-10-CM

## 2015-03-31 DIAGNOSIS — F1410 Cocaine abuse, uncomplicated: Secondary | ICD-10-CM

## 2015-03-31 DIAGNOSIS — O09892 Supervision of other high risk pregnancies, second trimester: Secondary | ICD-10-CM

## 2015-03-31 DIAGNOSIS — Z331 Pregnant state, incidental: Secondary | ICD-10-CM | POA: Diagnosis not present

## 2015-03-31 DIAGNOSIS — Z36 Encounter for antenatal screening of mother: Secondary | ICD-10-CM

## 2015-03-31 DIAGNOSIS — Z1389 Encounter for screening for other disorder: Secondary | ICD-10-CM | POA: Diagnosis not present

## 2015-03-31 LAB — POCT URINALYSIS DIPSTICK
Blood, UA: NEGATIVE
Glucose, UA: NEGATIVE
Ketones, UA: NEGATIVE
Leukocytes, UA: NEGATIVE
Nitrite, UA: NEGATIVE
Protein, UA: NEGATIVE

## 2015-03-31 MED ORDER — NEOMYCIN-POLYMYXIN-HC 3.5-10000-1 OT SOLN: 3.0000 [drp] | Freq: Three times a day (TID) | OTIC | Status: DC

## 2015-03-31 MED ORDER — TRIAMCINOLONE ACETONIDE 0.1 % EX CREA: 1.0000 "application " | TOPICAL_CREAM | Freq: Four times a day (QID) | CUTANEOUS | Status: DC | PRN

## 2015-03-31 NOTE — Progress Notes (Signed)
[place] 19+3wks,measurements c/w dates, cephalic,ant pl gr 0,normal ov's bilat, svp of fluid 4.7cm,fht 153bpm, efw 294g,anatomy complete,no obvious abn seen

## 2015-03-31 NOTE — Progress Notes (Signed)
Fetal Surveillance Testing today:  sonogram   High Risk Pregnancy Diagnosis(es):   Elevated trisomy 18 risk, narcotic and cocaine use  [id number] [redacted]w[redacted]d Estimated Date of Delivery: 08/22/15  Blood pressure 90/60, pulse 72, weight 136 lb (61.689 kg), last menstrual period 12/02/2014.  Urinalysis: Negative   HPI: The patient is being seen today for ongoing management of as above. Today she reports no further use of cocaine or narcotics.  She knows there will be q visit tests of her urine   BP weight and urine results all reviewed and noted. Patient reports good fetal movement, denies any bleeding and no rupture of membranes symptoms or regular contractions.  Fundal Height:  22 Fetal Heart rate:  153 Edema:  none  Patient is without complaints other than noted in her HPI. All questions were answered.  All lab and sonogram results have been reviewed. Comments: abnormal: trisomy 18   Assessment:  1.  Pregnancy at [redacted]w[redacted]d,  Estimated Date of Delivery: 08/22/15 :                          2.  Elevated trisomy 18 on quad screen, cfDNA test today is negative                        3.  narcotitc and cocaine use: pt states she has quit  Medication(s) Plans:    Treatment Plan:  cfDNA today  Follow up in 4 weeks for appointment for high risk OB care,

## 2015-04-07 LAB — INFORMASEQ(SM) WITH XY ANALYSIS
Fetal Fraction (%):: 14.8
Fetal Number: 1
Gestational Age at Collection: 19.4 weeks
Weight: 136 [lb_av]

## 2015-04-22 ENCOUNTER — Telehealth: Payer: Self-pay | Admitting: *Deleted

## 2015-04-22 NOTE — Telephone Encounter (Signed)
Pt states she thinks she has UTI and wants to try OTC AZO, per Joellyn Haff, CNM sage to take at 22 weeks of pregnancy if no improvement pt to contact our office. Pt verbalized understanding.

## 2015-04-28 ENCOUNTER — Encounter: Payer: Self-pay | Admitting: Obstetrics & Gynecology

## 2015-04-28 ENCOUNTER — Encounter: Payer: Medicaid Other | Admitting: Obstetrics & Gynecology

## 2015-05-07 ENCOUNTER — Other Ambulatory Visit: Payer: Self-pay | Admitting: Obstetrics & Gynecology

## 2015-05-30 ENCOUNTER — Telehealth: Payer: Self-pay | Admitting: *Deleted

## 2015-05-30 NOTE — Telephone Encounter (Signed)
Dentist office called stating pt had several tooth extractions today, is it safe to prescribed Tylenol with Codeine for pain? Per Dr.Eure it is safe for pt to take Tylenol with Codeine at 28 weeks.

## 2015-06-11 ENCOUNTER — Encounter (HOSPITAL_COMMUNITY): Payer: Self-pay | Admitting: *Deleted

## 2015-06-11 ENCOUNTER — Emergency Department (HOSPITAL_COMMUNITY)
Admission: EM | Admit: 2015-06-11 | Discharge: 2015-06-11 | Disposition: A | Discharge status: Home / Self Care | Payer: Medicaid Other | Attending: Emergency Medicine | Admitting: Emergency Medicine

## 2015-06-11 DIAGNOSIS — Z8659 Personal history of other mental and behavioral disorders: Secondary | ICD-10-CM | POA: Diagnosis not present

## 2015-06-11 DIAGNOSIS — G8929 Other chronic pain: Secondary | ICD-10-CM | POA: Insufficient documentation

## 2015-06-11 DIAGNOSIS — W57XXXA Bitten or stung by nonvenomous insect and other nonvenomous arthropods, initial encounter: Secondary | ICD-10-CM | POA: Insufficient documentation

## 2015-06-11 DIAGNOSIS — Y998 Other external cause status: Secondary | ICD-10-CM | POA: Insufficient documentation

## 2015-06-11 DIAGNOSIS — J45909 Unspecified asthma, uncomplicated: Secondary | ICD-10-CM | POA: Diagnosis not present

## 2015-06-11 DIAGNOSIS — S40861A Insect bite (nonvenomous) of right upper arm, initial encounter: Secondary | ICD-10-CM | POA: Diagnosis not present

## 2015-06-11 DIAGNOSIS — Z720 Tobacco use: Secondary | ICD-10-CM | POA: Diagnosis not present

## 2015-06-11 DIAGNOSIS — S80862A Insect bite (nonvenomous), left lower leg, initial encounter: Secondary | ICD-10-CM | POA: Insufficient documentation

## 2015-06-11 DIAGNOSIS — S20469A Insect bite (nonvenomous) of unspecified back wall of thorax, initial encounter: Secondary | ICD-10-CM | POA: Insufficient documentation

## 2015-06-11 DIAGNOSIS — Z8742 Personal history of other diseases of the female genital tract: Secondary | ICD-10-CM | POA: Insufficient documentation

## 2015-06-11 DIAGNOSIS — Y9289 Other specified places as the place of occurrence of the external cause: Secondary | ICD-10-CM | POA: Insufficient documentation

## 2015-06-11 DIAGNOSIS — Z79899 Other long term (current) drug therapy: Secondary | ICD-10-CM | POA: Diagnosis not present

## 2015-06-11 DIAGNOSIS — S80861A Insect bite (nonvenomous), right lower leg, initial encounter: Secondary | ICD-10-CM | POA: Insufficient documentation

## 2015-06-11 DIAGNOSIS — Y9389 Activity, other specified: Secondary | ICD-10-CM | POA: Insufficient documentation

## 2015-06-11 DIAGNOSIS — Z8619 Personal history of other infectious and parasitic diseases: Secondary | ICD-10-CM | POA: Diagnosis not present

## 2015-06-11 DIAGNOSIS — R21 Rash and other nonspecific skin eruption: Secondary | ICD-10-CM | POA: Diagnosis present

## 2015-06-11 DIAGNOSIS — S40862A Insect bite (nonvenomous) of left upper arm, initial encounter: Secondary | ICD-10-CM | POA: Insufficient documentation

## 2015-06-11 NOTE — ED Provider Notes (Signed)
CSN: [id number]     Arrival date & time 06/11/15  1405 History   First MD Initiated Contact with Patient 06/11/15 1423     Chief Complaint  Patient presents with  . Rash     (Consider location/radiation/quality/duration/timing/severity/associated sxs/prior Treatment) Patient is a 39 y.o. female presenting with rash. The history is provided by the patient. No language interpreter was used.  Rash Location:  Full body Quality: itchiness and redness   Severity:  Moderate Onset quality:  Sudden Timing:  Constant Progression:  Unchanged Context: insect bite/sting   Relieved by:  Nothing Ineffective treatments:  Topical steroids Associated symptoms: no fever, no nausea and no sore throat     Past Medical History  Diagnosis Date  . Ectopic pregnancy   . Asthma   . Trichomonas vaginitis   . BV (bacterial vaginosis) 03/06/2013  . Chronic dental pain   . Panic attacks   . Chronic pelvic pain in female   . Depression    Past Surgical History  Procedure Laterality Date  . Ectopic pregnancy surgery     Family History  Problem Relation Age of Onset  . COPD Mother   . Stroke Mother   . Heart disease Maternal Grandmother   . Hypertension Maternal Grandmother   . Stroke Maternal Grandfather   . Asthma Sister   . Down syndrome Sister    Social History  Substance Use Topics  . Smoking status: Current Every Day Smoker -- 1.00 packs/day    Types: Cigarettes  . Smokeless tobacco: Never Used  . Alcohol Use: No   OB History    Gravida Para Term Preterm AB TAB SAB Ectopic Multiple Living   Review of Systems  Constitutional: Negative for fever.  HENT: Negative for sore throat.   Gastrointestinal: Negative for nausea.  Skin: Positive for rash.  All other systems reviewed and are negative.     Allergies  Review of patient's allergies indicates no known allergies.  Home Medications   Prior to Admission medications   Medication Sig Start Date End Date  Taking? Authorizing Provider  albuterol (PROVENTIL HFA;VENTOLIN HFA) 108 (90 BASE) MCG/ACT inhaler Inhale 2 puffs into the lungs every 6 (six) hours as needed for wheezing or shortness of breath.    Historical Provider, MD  NEOMYCIN-POLYMYXIN-HYDROCORTISONE (CORTISPORIN) 1 % SOLN otic solution PLACE THREE DROPS INTO THE LEFT EAR THREE TIMES DAILY. 05/07/15   Lazaro Arms, MD  ondansetron (ZOFRAN-ODT) 4 MG disintegrating tablet Take 1 tablet (4 mg total) by mouth once. Patient not taking: Reported on 03/31/2015 03/29/15   Marily Memos, MD  Prenat w/o A-FeCbGl-DSS-FA-DHA (CITRANATAL ASSURE) 35-1 & 300 MG tablet One tablet and one capsule daily Patient taking differently: Take 1 tablet by mouth daily. One tablet and one capsule daily 03/24/15   Cheral Marker, CNM  triamcinolone cream (KENALOG) 0.1 % Apply 1 application topically 4 (four) times daily as needed. 03/31/15   Lazaro Arms, MD   BP 107/54 mmHg  Pulse 94  Temp(Src) 97.9 F (36.6 C) (Oral)  Resp 16  Ht  (1.549 m)  Wt 148 lb (67.132 kg)  BMI 27.98 kg/m2  SpO2 99%  LMP 12/02/2014 (Approximate) Physical Exam  Constitutional: She is oriented to person, place, and time. She appears well-developed and well-nourished.  HENT:  Right Ear: External ear normal.  Left Ear: External ear normal.  Mouth/Throat: Oropharynx is clear and moist.  Cardiovascular:  Normal rate and regular rhythm.   Pulmonary/Chest: Effort normal and breath sounds normal.  Musculoskeletal: Normal range of motion.  Neurological: She is oriented to person, place, and time.  Skin:  Red raised bumps to extremities and truck no facial involvement noted  Nursing note and vitals reviewed.   ED Course  Procedures (including critical care time) Labs Review Labs Reviewed - No data to display  Imaging Review No results found. I have personally reviewed and evaluated these images and lab results as part of my medical decision-making.   EKG Interpretation None       MDM   Final diagnoses:  Bed bug bite    Discussed there is not real treatment except to get rid of the bed bugs    Teressa LowerVrinda Maizie Garno, NP 06/11/15 1446  Samuel JesterKathleen McManus, DO 06/12/15 [phone]

## 2015-06-11 NOTE — ED Notes (Signed)
Patient reports rash from bed bugs, states she was given a cream, but it isn't helping.

## 2015-08-03 ENCOUNTER — Inpatient Hospital Stay (HOSPITAL_COMMUNITY)
Admission: EM | Admit: 2015-08-03 | Discharge: 2015-08-05 | DRG: 775 | Disposition: A | Discharge status: Home / Self Care | Payer: Medicaid Other | Attending: Family Medicine | Admitting: Family Medicine

## 2015-08-03 ENCOUNTER — Encounter (HOSPITAL_COMMUNITY): Payer: Self-pay | Admitting: Emergency Medicine

## 2015-08-03 DIAGNOSIS — F1290 Cannabis use, unspecified, uncomplicated: Secondary | ICD-10-CM | POA: Diagnosis present

## 2015-08-03 DIAGNOSIS — Z3A37 37 weeks gestation of pregnancy: Secondary | ICD-10-CM | POA: Diagnosis not present

## 2015-08-03 DIAGNOSIS — O99324 Drug use complicating childbirth: Secondary | ICD-10-CM | POA: Diagnosis present

## 2015-08-03 DIAGNOSIS — Z8279 Family history of other congenital malformations, deformations and chromosomal abnormalities: Secondary | ICD-10-CM | POA: Diagnosis not present

## 2015-08-03 DIAGNOSIS — O99334 Smoking (tobacco) complicating childbirth: Secondary | ICD-10-CM | POA: Diagnosis present

## 2015-08-03 DIAGNOSIS — F17210 Nicotine dependence, cigarettes, uncomplicated: Secondary | ICD-10-CM | POA: Diagnosis present

## 2015-08-03 DIAGNOSIS — Z23 Encounter for immunization: Secondary | ICD-10-CM

## 2015-08-03 DIAGNOSIS — F1410 Cocaine abuse, uncomplicated: Secondary | ICD-10-CM | POA: Diagnosis present

## 2015-08-03 DIAGNOSIS — N898 Other specified noninflammatory disorders of vagina: Secondary | ICD-10-CM | POA: Diagnosis present

## 2015-08-03 HISTORY — DX: Other psychoactive substance abuse, uncomplicated: F19.10

## 2015-08-03 LAB — CBC WITH DIFFERENTIAL/PLATELET
Basophils Absolute: 0 10*3/uL (ref 0.0–0.1)
Basophils Relative: 0 %
Eosinophils Absolute: 0.1 10*3/uL (ref 0.0–0.7)
Eosinophils Relative: 0 %
HCT: 32 % — ABNORMAL LOW (ref 36.0–46.0)
Hemoglobin: 10.8 g/dL — ABNORMAL LOW (ref 12.0–15.0)
Lymphocytes Relative: 13 %
Lymphs Abs: 1.8 10*3/uL (ref 0.7–4.0)
MCH: 33.3 pg (ref 26.0–34.0)
MCHC: 33.8 g/dL (ref 30.0–36.0)
MCV: 98.8 fL (ref 78.0–100.0)
Monocytes Absolute: 0.7 10*3/uL (ref 0.1–1.0)
Monocytes Relative: 5 %
Neutro Abs: 11.5 10*3/uL — ABNORMAL HIGH (ref 1.7–7.7)
Neutrophils Relative %: 82 %
Platelets: 145 10*3/uL — ABNORMAL LOW (ref 150–400)
RBC: 3.24 MIL/uL — ABNORMAL LOW (ref 3.87–5.11)
RDW: 13.6 % (ref 11.5–15.5)
WBC: 14.1 10*3/uL — ABNORMAL HIGH (ref 4.0–10.5)

## 2015-08-03 LAB — TYPE AND SCREEN
ABO/RH(D): O POS
Antibody Screen: NEGATIVE

## 2015-08-03 LAB — URINALYSIS, ROUTINE W REFLEX MICROSCOPIC
Bilirubin Urine: NEGATIVE
Glucose, UA: NEGATIVE mg/dL
Hgb urine dipstick: NEGATIVE
Ketones, ur: NEGATIVE mg/dL
Leukocytes, UA: NEGATIVE
Nitrite: NEGATIVE
Protein, ur: NEGATIVE mg/dL
Specific Gravity, Urine: 1.02 (ref 1.005–1.030)
pH: 6 (ref 5.0–8.0)

## 2015-08-03 LAB — RAPID URINE DRUG SCREEN, HOSP PERFORMED
Amphetamines: NOT DETECTED
Barbiturates: NOT DETECTED
Benzodiazepines: NOT DETECTED
Cocaine: POSITIVE — AB
Opiates: NOT DETECTED
Tetrahydrocannabinol: NOT DETECTED

## 2015-08-03 LAB — COMPREHENSIVE METABOLIC PANEL
ALT: 14 U/L (ref 14–54)
AST: 16 U/L (ref 15–41)
Albumin: 2.8 g/dL — ABNORMAL LOW (ref 3.5–5.0)
Alkaline Phosphatase: 159 U/L — ABNORMAL HIGH (ref 38–126)
Anion gap: 8 (ref 5–15)
BUN: 11 mg/dL (ref 6–20)
CO2: 22 mmol/L (ref 22–32)
Calcium: 8.8 mg/dL — ABNORMAL LOW (ref 8.9–10.3)
Chloride: 104 mmol/L (ref 101–111)
Creatinine, Ser: 0.56 mg/dL (ref 0.44–1.00)
GFR calc Af Amer: >60 mL/min (ref >60)
GFR calc non Af Amer: >60 mL/min (ref >60)
Glucose, Bld: 105 mg/dL — ABNORMAL HIGH (ref 65–99)
Potassium: 3.6 mmol/L (ref 3.5–5.1)
Sodium: 134 mmol/L — ABNORMAL LOW (ref 135–145)
Total Bilirubin: 0.2 mg/dL — ABNORMAL LOW (ref 0.3–1.2)
Total Protein: 6.1 g/dL — ABNORMAL LOW (ref 6.5–8.1)

## 2015-08-03 LAB — WET PREP, GENITAL
Clue Cells Wet Prep HPF POC: NONE SEEN
Sperm: NONE SEEN
Trich, Wet Prep: NONE SEEN
Yeast Wet Prep HPF POC: NONE SEEN

## 2015-08-03 LAB — RAPID HIV SCREEN (HIV 1/2 AB+AG)
HIV 1/2 Antibodies: NONREACTIVE
HIV-1 P24 Antigen - HIV24: NONREACTIVE

## 2015-08-03 LAB — ABO/RH: ABO/RH(D): O POS

## 2015-08-03 MED ORDER — LACTATED RINGERS IV SOLN
INTRAVENOUS | Status: DC
  Administered 2015-08-03: 20:00:00 via INTRAVENOUS

## 2015-08-03 MED ORDER — ONDANSETRON HCL 4 MG/2ML IJ SOLN: 4.0000 mg | Freq: Four times a day (QID) | INTRAMUSCULAR | Status: DC | PRN

## 2015-08-03 MED ORDER — OXYCODONE-ACETAMINOPHEN 5-325 MG PO TABS: 2.0000 | ORAL_TABLET | ORAL | Status: DC | PRN

## 2015-08-03 MED ORDER — LIDOCAINE HCL (PF) 1 % IJ SOLN
30.0000 mL | INTRAMUSCULAR | Status: DC | PRN
Start: 2015-08-03 — End: 2015-08-04
  Filled 2015-08-03: qty 30

## 2015-08-03 MED ORDER — OXYCODONE-ACETAMINOPHEN 5-325 MG PO TABS: 1.0000 | ORAL_TABLET | ORAL | Status: DC | PRN

## 2015-08-03 MED ORDER — AMPICILLIN SODIUM 2 G IJ SOLR
2.0000 g | Freq: Once | INTRAMUSCULAR | Status: AC
  Administered 2015-08-03: 2 g via INTRAVENOUS
  Filled 2015-08-03: qty 2000

## 2015-08-03 MED ORDER — LACTATED RINGERS IV SOLN: 500.0000 mL | INTRAVENOUS | Status: DC | PRN

## 2015-08-03 MED ORDER — OXYTOCIN BOLUS FROM INFUSION
500.0000 mL | INTRAVENOUS | Status: DC
  Administered 2015-08-03: 500 mL via INTRAVENOUS

## 2015-08-03 MED ORDER — SODIUM CHLORIDE 0.9 % IV BOLUS (SEPSIS)
1000.0000 mL | Freq: Once | INTRAVENOUS | Status: AC
  Administered 2015-08-03: 1000 mL via INTRAVENOUS

## 2015-08-03 MED ORDER — OXYTOCIN 40 UNITS IN LACTATED RINGERS INFUSION - SIMPLE MED: 62.5000 mL/h | INTRAVENOUS | Status: DC

## 2015-08-03 MED ORDER — CITRIC ACID-SODIUM CITRATE 334-500 MG/5ML PO SOLN: 30.0000 mL | ORAL | Status: DC | PRN

## 2015-08-03 MED ORDER — ACETAMINOPHEN 325 MG PO TABS
650.0000 mg | ORAL_TABLET | ORAL | Status: DC | PRN
Start: 2015-08-03 — End: 2015-08-04

## 2015-08-03 MED ORDER — IBUPROFEN 600 MG PO TABS
600.0000 mg | ORAL_TABLET | Freq: Four times a day (QID) | ORAL | Status: DC
  Administered 2015-08-03 – 2015-08-05 (×7): 600 mg via ORAL
  Filled 2015-08-03 (×7): qty 1

## 2015-08-03 NOTE — Progress Notes (Signed)
Received call from [facility], Tiffany RN. Pt is a G6P3 37 2/[redacted] weeks gestation with c/o lower abd pain and a green vaginal d/c. Pt has had limited PNC. [facility]. [facility]. Says they have obtained GC, Chlamydia, wet prep, CBC, T&S, Chemistry panel, HIV panel, UA. She is to receive a liter of fluids. Dr. Hyacinth MeekerMiller has examined the pt and says her cervix is 1cm, soft.
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
## 2015-08-03 NOTE — ED Notes (Signed)
[facility] rapid response request UDS due to lack of prenatal care. MD notified and verbal order to add UDS to orders.

## 2015-08-03 NOTE — ED Notes (Addendum)
Pt. Spotting scant amout of blood now. EDP notified.

## 2015-08-03 NOTE — ED Notes (Signed)
Pt. Starting to have what seems like contractions, abdomen tight, pt. Very uncomfortable. TOCO monitor adjusted to hopefully show activity to [facility]

## 2015-08-03 NOTE — Progress Notes (Signed)
Spoke with AstronomerTiffany RN. She says that Dr. Hyacinth MeekerMiller has spoken with Dr. Adrian BlackwaterStinson and an Darla Leschesamniosure has been ordered. Says they do not have this test in their lab.

## 2015-08-03 NOTE — ED Notes (Signed)
Pt. Leaving by EMS now. Another scant amount of blood noted on chucks while transferring to EMS stretcher.

## 2015-08-03 NOTE — Progress Notes (Signed)
Spoke with Tiffany RN and asked that she adjust the cardio in order to have a consistant tracing. Says that EMS has arrived and [facility]

## 2015-08-03 NOTE — ED Notes (Signed)
EMS here for transport

## 2015-08-03 NOTE — Progress Notes (Signed)
Spoke with Dr. Adrian BlackwaterStinson. Pt is a G6P3 at 37 2/[redacted] weeks gestation who presents to [facility] with c/o lower abd pain and a green vaginal d/c. Cultures and labs have been obtained. Pt is to receive a liter of IV fluids. Dr. Hyacinth MeekerMiller has examined the pt and says she is 1cm and soft. Notified that I am unable to admit pt into [facility] and I am having [facility] RN fax me tracings. He says he is comfortable with that rather than have the pt transferred here for fetal monitoring and further evaluation.

## 2015-08-03 NOTE — ED Notes (Signed)
Pt. Reports a "large amount of fluid leakage" x 4 days ago. States it was clear and today drainage turned green.

## 2015-08-03 NOTE — ED Notes (Signed)
Assisted with Pelvic exam. Per Dr Hyacinth MeekerMiller, 1 cm dilated, cervix soft, station low

## 2015-08-03 NOTE — ED Notes (Signed)
Patient arrives with c/o lower abdominal pain and vaginal discharge. Very little prenatal care [facility]. LMP per chart is 12/02/14, but patient is not sure. Denies drug/ETOH abuse. [id number], 2 elective abortions.

## 2015-08-03 NOTE — ED Notes (Signed)
Per lab, amnisure ROM test not available at this facility. MD and OB RN notified.

## 2015-08-03 NOTE — MAU Note (Signed)
Pt. Went to [facility] because she started leaking brownish green liquid and she started to contract while she was there, then [facility].

## 2015-08-03 NOTE — Progress Notes (Signed)
Dr. Adrian BlackwaterStinson notified that I am able to see the FHR tracing in OBIX.

## 2015-08-03 NOTE — Progress Notes (Signed)
Dr. Adrian BlackwaterStinson notified that [facility] lab does not have the amniosure test.

## 2015-08-03 NOTE — Progress Notes (Signed)
Report given to triage charge nurse.

## 2015-08-03 NOTE — Progress Notes (Signed)
Dr. Adrian BlackwaterStinson notified that now I am seeing contractions on the fetal monitor. Says he will pull the tracing up and review it. FHR tracing is a category 1. UC's are every 2-3 min. Says okay to [facility].

## 2015-08-03 NOTE — H&P (Signed)
Susan [name] is a 39 y.o. female presenting for rule-out rupture of membranes.  Transferred from [facility] with report of green brown discharge.  Upon discussion with patient reports leaking of fluid x one week that became greenish brown today.  Insufficient care received at [facility].  Pregnancy complicated by advanced maternal age, cocaine abuse and T18 on quad screen.  Prenatal note reports negative Cell Free DNA (unable to locate within Epic). History OB History    Gravida Para Term Preterm AB TAB SAB Ectopic Multiple Living   Past Medical History  Diagnosis Date  . Ectopic pregnancy   . Asthma   . Trichomonas vaginitis   . BV (bacterial vaginosis) 03/06/2013  . Chronic dental pain   . Panic attacks   . Chronic pelvic pain in female   . Depression   . Polysubstance abuse     cocaine, opiates, marijuana   Past Surgical History  Procedure Laterality Date  . Ectopic pregnancy surgery     Family History: family history includes Asthma in her sister; COPD in her mother; Down syndrome in her sister; Heart disease in her maternal grandmother; Hypertension in her maternal grandmother; Stroke in her maternal grandfather and mother. Social History:  reports that she has been smoking Cigarettes.  She has been smoking about 1.00 pack per day. She has never used smokeless tobacco. She reports that she uses illicit drugs (Marijuana and Cocaine). She reports that she does not drink alcohol.   Prenatal Transfer Tool  Maternal Diabetes:  Not tested; insufficient Care Genetic Screening: Abnormal:  Results: Elevated risk of Trisomy 18; noted negative Cell free DNA Maternal Ultrasounds/Referrals: Normal Fetal Ultrasounds or other Referrals:  None Maternal Substance Abuse:  Yes:  Type: Marijuana, Cocaine Significant Maternal Medications:  None Significant Maternal Lab Results:  Lab values include: Other:  Other Comments:  GBS unknown  Review of Systems   Gastrointestinal: Positive for abdominal pain (contractions).  Genitourinary:       Greenish discharge  Pt arrives in advanced stage of labor - difficult to obtain full ROS   Dilation: 9 Effacement (%): 100 Station: -2 Exam by:: Claud Kelp RN, Rudene Re RN Blood pressure 112/60, pulse 86, temperature 98.4 F (36.9 C), temperature source Oral, resp. rate 20, weight 67.132 kg (148 lb), last menstrual period 12/02/2014, SpO2 95 %. Maternal Exam:  Introitus: Vagina is positive for vaginal discharge.    Physical Exam  Constitutional: She is oriented to person, place, and time. She appears well-developed and well-nourished.  HENT:  Head: Normocephalic.  Neck: Normal range of motion. Neck supple.  Cardiovascular: Normal rate, regular rhythm and normal heart sounds.   Respiratory: Effort normal and breath sounds normal. No respiratory distress.  GI: Soft. There is no tenderness.  Genitourinary: No bleeding in the vagina. Vaginal discharge found.  Musculoskeletal: Normal range of motion. She exhibits no edema.  Neurological: She is alert and oriented to person, place, and time.  Skin: Skin is warm and dry.    Prenatal labs: ABO, Rh: --/--/O POS (12/25 1825) Antibody: Negative (08/15 1214) Rubella: >33.00 (08/15 1214) RPR: Non Reactive (08/15 1214)  HBsAg: Negative (08/15 1214)  HIV: Non Reactive (08/15 1214)  GBS:   Unknown  Assessment/Plan: 39 y.o. [id number] at [redacted]w[redacted]d IUP Active Labor GBS  Unknown Advanced maternal Age Cocaine/Marijuana use  Plan: Admit to [employer] Anticipate NSVD Ampicillin 2 GM IV if possible  Rochele PagesKARIM, Add Dinapoli N 08/03/2015, 7:45 PM

## 2015-08-03 NOTE — Progress Notes (Signed)
[facility] ED RN notified that pt is to be transferred here to MAU.

## 2015-08-03 NOTE — Consult Note (Signed)
Neonatology Note:   Attendance at Delivery:    I was asked by Dr. Adrian BlackwaterStinson to attend this NSVD at term due to fetal distress [facility] and h/o illicit drug use presenting with +UDS for cocaine and report of greenish/brown vaginal discharge after ROM x1 week. The mother is a [id number], GBS unknown with otherwise reassuring labs. ROM ~1wk prior to delivery.  No fever or diagnosis of chorioamnionitis.  Fluid clear. Infant initially with poor tone and concern for distress thus NICU called.  On my arrival at 1-2 minutes of life, infant with good tone, cry and HR >100.  Suctioned mouth to clear airway.  Remained vigorous with good spontaneous cry and tone. Sao2 checked and appropriate at 82-91% at 5min of life was 9 (1min of 8 assigned by L&D). +void in bed. No obvious dysmorphisms.  3 vessel cord.  To CN to care of Pediatrician. D/w NBN staff.  Per CDC guidelines for GBS prophylaxis, obtain 6-12h CBC/diff.  Also recommend UDS and mec drug screens.  Monitor for signs and symptoms of NAS and infection.  Discourage lactation due to illicit drug use.   Jamie Brookesavid Ehrmann, MD

## 2015-08-03 NOTE — Progress Notes (Signed)
Unable to place pt in OBIX. Says ERROR. Tiffany RN, notified. She is checking her monitor. I have requested that pt"s FHR tracing be faxed to me at [phone].

## 2015-08-03 NOTE — Progress Notes (Signed)
Spoke with AstronomerTiffany RN. Dr. Rondel BatonMiller's Number obtained.

## 2015-08-03 NOTE — Progress Notes (Signed)
Spoke with Dr. Adrian BlackwaterStinson. I am unsure if the pt has a green vaginal d/c or is she ruptured with meconium stained fluid. I asked Dr. Adrian BlackwaterStinson to call Dr. Hyacinth MeekerMiller at [phone] to discuss this with him. Also, I am not sure if the pt is contracting and we are not seeing them because the toco is not in the correct place. [facility] ED RN says the pt is having abd pain.

## 2015-08-03 NOTE — ED Notes (Signed)
[facility]. EMS notified.

## 2015-08-03 NOTE — ED Notes (Signed)
Mary, RN with OB rapid response notified of patient and is going to monitor.

## 2015-08-04 LAB — HEPATITIS B SURFACE ANTIGEN: Hepatitis B Surface Ag: NEGATIVE

## 2015-08-04 LAB — HIV ANTIBODY (ROUTINE TESTING W REFLEX): HIV Screen 4th Generation wRfx: NONREACTIVE

## 2015-08-04 LAB — ABO/RH: ABO/RH(D): O POS

## 2015-08-04 LAB — RPR: RPR Ser Ql: NONREACTIVE

## 2015-08-04 MED ORDER — PNEUMOCOCCAL VAC POLYVALENT 25 MCG/0.5ML IJ INJ
0.5000 mL | INJECTION | INTRAMUSCULAR | Status: AC
  Administered 2015-08-05: 0.5 mL via INTRAMUSCULAR
  Filled 2015-08-04: qty 0.5

## 2015-08-04 MED ORDER — TETANUS-DIPHTH-ACELL PERTUSSIS 5-2.5-18.5 LF-MCG/0.5 IM SUSP
0.5000 mL | Freq: Once | INTRAMUSCULAR | Status: AC
  Administered 2015-08-04: 0.5 mL via INTRAMUSCULAR

## 2015-08-04 MED ORDER — DIBUCAINE 1 % RE OINT: 1.0000 "application " | TOPICAL_OINTMENT | RECTAL | Status: DC | PRN

## 2015-08-04 MED ORDER — WITCH HAZEL-GLYCERIN EX PADS: 1.0000 "application " | MEDICATED_PAD | CUTANEOUS | Status: DC | PRN

## 2015-08-04 MED ORDER — ACETAMINOPHEN 325 MG PO TABS
650.0000 mg | ORAL_TABLET | ORAL | Status: DC | PRN
  Administered 2015-08-04 – 2015-08-05 (×3): 650 mg via ORAL
  Filled 2015-08-04 (×3): qty 2

## 2015-08-04 MED ORDER — ALBUTEROL SULFATE (2.5 MG/3ML) 0.083% IN NEBU: 3.0000 mL | INHALATION_SOLUTION | Freq: Four times a day (QID) | RESPIRATORY_TRACT | Status: DC | PRN

## 2015-08-04 MED ORDER — LANOLIN HYDROUS EX OINT: TOPICAL_OINTMENT | CUTANEOUS | Status: DC | PRN

## 2015-08-04 MED ORDER — SENNOSIDES-DOCUSATE SODIUM 8.6-50 MG PO TABS
2.0000 | ORAL_TABLET | ORAL | Status: DC
  Administered 2015-08-04: 2 via ORAL
  Filled 2015-08-04: qty 2

## 2015-08-04 MED ORDER — SIMETHICONE 80 MG PO CHEW: 80.0000 mg | CHEWABLE_TABLET | ORAL | Status: DC | PRN

## 2015-08-04 MED ORDER — PRENATAL MULTIVITAMIN CH
1.0000 | ORAL_TABLET | Freq: Every day | ORAL | Status: DC
  Administered 2015-08-04 – 2015-08-05 (×2): 1 via ORAL
  Filled 2015-08-04 (×2): qty 1

## 2015-08-04 MED ORDER — DIPHENHYDRAMINE HCL 25 MG PO CAPS: 25.0000 mg | ORAL_CAPSULE | Freq: Four times a day (QID) | ORAL | Status: DC | PRN

## 2015-08-04 MED ORDER — ONDANSETRON HCL 4 MG PO TABS: 4.0000 mg | ORAL_TABLET | ORAL | Status: DC | PRN

## 2015-08-04 MED ORDER — ZOLPIDEM TARTRATE 5 MG PO TABS: 5.0000 mg | ORAL_TABLET | Freq: Every evening | ORAL | Status: DC | PRN

## 2015-08-04 MED ORDER — ONDANSETRON HCL 4 MG/2ML IJ SOLN: 4.0000 mg | INTRAMUSCULAR | Status: DC | PRN

## 2015-08-04 MED ORDER — BENZOCAINE-MENTHOL 20-0.5 % EX AERO: 1.0000 "application " | INHALATION_SPRAY | CUTANEOUS | Status: DC | PRN

## 2015-08-04 NOTE — Progress Notes (Addendum)
Post Partum Day 1  Subjective:  Susan [name] is a 39 y.o. [id number] 4960w2d s/p SVD.  No acute events overnight.  Pt denies problems with ambulating, voiding or po intake.  She denies nausea or vomiting.  Pain is moderately controlled.  She has had flatus. She has not had bowel movement.  Lochia Minimal.  Plan for birth control is Depo-Provera.  Method of Feeding: Bottle  Objective: BP 102/56 mmHg  Pulse 69  Temp(Src) 98.1 F (36.7 C) (Oral)  Resp 18  Ht 5\' 1"  (1.549 m)  Wt 148 lb (67.132 kg)  BMI 27.98 kg/m2  SpO2 95%  LMP 12/02/2014 (Approximate)  Breastfeeding? Unknown  Physical Exam:  General: alert, cooperative and no distress Lochia:normal flow Chest: CTAB Heart: RRR no m/r/g Abdomen: +BS, soft, nontender, fundus firm at/below umbilicus Uterine Fundus: firm, DVT Evaluation: No evidence of DVT seen on physical exam.   Recent Labs  08/03/15 1735  HGB 10.8*  HCT 32.0*    Assessment/Plan:  ASSESSMENT: Susan Filludrey W Hayworth is a 39 y.o. [id number] 6160w2d ppd #1 s/p NSVD doing well.   Plan for discharge tomorrow   LOS: 1 day   Asiyah Z Mikell 08/04/2015, 7:04 AM   I examined pt and agree with documentation above and resident plan of care.  Social work consult pending for substance abuse.  Eino FarberWalidah Kennith GainN Karim, CNM

## 2015-08-04 NOTE — Clinical Social Work Maternal (Signed)
CLINICAL SOCIAL WORK MATERNAL/CHILD NOTE  Patient Details  Name: Susan [name] MRN: [id number] Date of Birth: 1976/02/18  Date:  08/04/2015  Clinical Social Worker Initiating Note:  Loleta Books MSW, LCSW Date/ Time Initiated:  08/04/15/1000     Child's Name:  Susan [name]   Legal Guardian:  Mother-- Susan [name]. [name] declined to provide FOB's name.   Need for Interpreter:  None   Date of Referral:  08/03/15     Reason for Referral:  Current Substance Use/Substance Use During Pregnancy (cocaine)   Referral Source:  [facility]   Address:  [address] [place], [place]   Phone number:  [phone]   Household Members:  Minor Children, Parents, Siblings   Natural Supports (not living in the home):  Extended Family, Immediate Family   Professional Supports: None   Employment: Unemployed   Type of Work:   N/A  Education:    N/A  Surveyor, quantity Resources:  Medicaid   Other Resources:  [employer]   Cultural/Religious Considerations Which May Impact Care:  None reported  Strengths:  Ability to meet basic needs , Home prepared for child    Risk Factors/Current Problems:   1. Substance Use: [name]'s UDS positive for cocaine on 03/14/2015 and 08/03/2015. Infant's UDS is positive for cocaine, and cord tissue is pending.  2. Mental Health Concerns: [name] presents with history of anxiety and depression.  No current treatment.  3. Limited prenatal care: [name] reported only 2 prenatal appointments.    Cognitive State:  Able to Concentrate , Alert , Goal Oriented    Mood/Affect:  Apprehensive , Blunted    CSW Assessment:  CSW received request for consult due to [name] presenting with a history of cocaine use and limited prenatal care during this pregnancy. [name] provided consent for her mother to remain in the room during the assessment, but asked her 35 year old daughter to step into the hallways.   [name] presented as a limited historian. She was closed, guarded, and displayed a limited range  in affect. Eye contact was limited, and did not clarify answers that were asked by CSW. [name] presented as minimally receptive to CSW visit, and visit with her was brief.  [name] declined to provide the FOB's name. She stated that she lives with her mother, her sister, and her 98 year old daughter. [name] shared that she has two other children (ages 54 and 48).  [name] reported that they do not live in her home, but never clarified where they live.  [name] stated that she has not yet informed them about the birth of the infant.  [name] reported that she is not employed.  Per [name], the home is prepared for the infant.   [name] reported history of depression and anxiety for approximately 5 years. She stated that she was previously prescribed medications, but stated that she is currently not on medications. [name] denied current need for medication, but stated that she most recently felt depressed approximately one week ago since she was ready for the infant to be born.  [name] did not express any interest in restarting mental health treatment at this time.   [name] was closed and guarded in particular about her history of substance use.  [name] originally stated that she "smokes", but did not clarify exact substance.  [name] never reported that she uses cocaine, until CSW informed [name] of her +UDS in August and upon this admission.  [name] never clarified frequency of use or last use.  [name] stated that she  does not identify substance use as a problem, and stated that she can go to "classes" if she wants to.  CSW gathered additional information from [facility], that [name] has a history of [facility].  [name] started to cry when she learned of the [name]'s last use, and reported that she did not know that the use was that recent.  CSW informed [name] of hospital drug screen policy, and results of infant's urine toxicology screen.  [name] did not acknowledge or share how she felt about the result.  CSW informed [name] and [name] of need to make a CPS  report, and discussed need for CPS to evaluate prior to discharge.  [name] began to ask if she will be allowed to take the infant home, and CSW stated that CPS will be evaluating and making the final determination..  [name] stated that she wants to parent the infant, but did indicate motivation/desire to engage in treatment.  [name] confirmed history of CPS involvement, but she did not clarify when or for what reasons.  CSW notes in prenatal records, that it is documentation that she does not have custody of her other children.  CSW Plan/Description:   1.Patient/Family Education: Perinatal mood and anxiety disorders, hospital drug screen policy  2. [place]. CSW spoke with Cierra, and made report at 12:15pm.   3. Psychosocial Support and Ongoing Assessment of Needs--  CSW to remain in contact with CPS in order to receive disposition recommendations.    Pervis HockingVenning, Rhondalyn Clingan N, LCSW 08/04/2015, 12:24 PM

## 2015-08-05 LAB — RUBELLA SCREEN: Rubella: >33 index (ref >0.99)

## 2015-08-05 MED ORDER — IBUPROFEN 600 MG PO TABS: 600.0000 mg | ORAL_TABLET | Freq: Four times a day (QID) | ORAL | Status: DC

## 2015-08-05 NOTE — Discharge Summary (Signed)
OB Discharge Summary  Patient Name: Susan [name] DOB: 1976/02/21 MRN: [id number]  Date of admission: 08/03/2015 Delivering MD: Marlis Edelson   Date of discharge: 08/05/2015  Admitting diagnosis: Vaginal discharge [N89.8] Preterm labor in third trimester without delivery [O60.03] Intrauterine pregnancy: [redacted]w[redacted]d     Secondary diagnosis:Active Problems:   Active labor  Additional problems:none     Discharge diagnosis: Term Pregnancy Delivered                                                                     Post partum procedures:none  Augmentation: Pitocin  Complications: None  Hospital course:  Onset of Labor With Vaginal Delivery     39 y.o. yo [id number] at [redacted]w[redacted]d was admitted in Active Laboron 08/03/2015. Patient had an uncomplicated labor course as follows:  Membrane Rupture Time/Date: 12:00 PM ,07/27/2015   Intrapartum Procedures: Episiotomy: None [1]                                         Lacerations:  None [1]  Patient had a delivery of a Viable infant. 08/03/2015  Information for the patient's newborn:  Susan, [name] [[id number]]  Delivery Method: Vaginal, Spontaneous Delivery (Filed from Delivery Summary)    Pateint had an uncomplicated postpartum course.  She is ambulating, tolerating a regular diet, passing flatus, and urinating well. Patient is discharged home in stable condition on No discharge date for patient encounter.Marland Kitchen    Physical exam  Filed Vitals:   08/03/15 2215 08/04/15 0215 08/04/15 1809 08/05/15 0616  BP: 107/58 102/56 115/66 105/50  Pulse: 80 69 74 67  Temp: 98.5 F (36.9 C) 98.1 F (36.7 C) 98.6 F (37 C) 98 F (36.7 C)  TempSrc: Oral Oral Oral Oral  Resp: Height:      Weight:      SpO2:   100%    General: alert, cooperative and no distress Lochia: appropriate Uterine Fundus: firm Incision: N/A DVT Evaluation: No evidence of DVT seen on physical exam. Negative Homan's sign. No cords or calf  tenderness. Labs: Lab Results  Component Value Date   WBC 14.1* 08/03/2015   HGB 10.8* 08/03/2015   HCT 32.0* 08/03/2015   MCV 98.8 08/03/2015   PLT 145* 08/03/2015   CMP Latest Ref Rng 08/03/2015  Glucose 65 - 99 mg/dL 130(Q)  BUN 6 - 20 mg/dL 11  Creatinine 6.57 - 8.46 mg/dL 9.62  Sodium 952 - 841 mmol/L 134(L)  Potassium 3.5 - 5.1 mmol/L 3.6  Chloride 101 - 111 mmol/L 104  CO2 22 - 32 mmol/L 22  Calcium 8.9 - 10.3 mg/dL 3.2(G)  Total Protein 6.5 - 8.1 g/dL 6.1(L)  Total Bilirubin 0.3 - 1.2 mg/dL 4.0(N)  Alkaline Phos 38 - 126 U/L 159(H)  AST 15 - 41 U/L 16  ALT 14 - 54 U/L 14    Discharge instruction: per After Visit Summary and "Baby and Me Booklet".  After Visit Meds:    Medication List    ASK your doctor about these medications        albuterol 108 (90 BASE) MCG/ACT  inhaler  Commonly known as:  PROVENTIL HFA;VENTOLIN HFA  Inhale 2 puffs into the lungs every 6 (six) hours as needed for wheezing or shortness of breath.     CITRANATAL ASSURE 35-1 & 300 MG tablet  One tablet and one capsule daily        Diet: routine diet  Activity: Advance as tolerated. Pelvic rest for 6 weeks.   Outpatient follow up:6 weeks Follow up Appt:No future appointments. Follow up visit: No Follow-up on file.  Postpartum contraception: Undecided  Newborn Data: Live born female  Birth Weight: 5 lb 8.4 oz (2505 g) APGAR: 8, 9  Baby Feeding: Bottle Disposition:home with mother   08/05/2015 Ferdie PingLAWSON, Susan DARLENE, CNM

## 2015-08-05 NOTE — Progress Notes (Addendum)
CSW notified by [place] CPS worker that she initiated case on 12/26 around 6pm.  CPS stated that she visited with the [name] in her hospital room.  Per CPS, [name] presents with extensive history of CPS involvement, and confirmed need to create a safe discharge plan for the infant.  CPS reported that she will be staffing the case with her supervisor, continuing their investigation, and will notify CSW later this afternoon with an update in their plan.   CSW to continue to follow.   Update at 11:30am: CSW followed up with [name] since there were questions related to her discharge, the infant becoming a baby patient versus a nursery patient.  [name] stated that she is eager and ready to be discharged home, but she understands that the infant is not ready.  CSW informed [name] of her ability to room-in with the infant and have the infant be a "baby patient" or to have infant become a "nursery baby" and transferring the infant to the CN.  [name] aware that she has the right to choose, and stated that she would prefer to have the infant in CN.  [name] asked about visitation, and CSW shared that she is able to visit the infant in CN at any time, and encouraged her to keep her ID band on her.  [name] agreeable.  [name] asked additional questions about CPS investigation, and CSW encouraged her to direct her questions to her CPS worker. [name] agreed.  

## 2015-08-08 LAB — GC/CHLAMYDIA PROBE AMP (~~LOC~~) NOT AT ARMC
Chlamydia: NEGATIVE
Neisseria Gonorrhea: NEGATIVE

## 2015-08-27 ENCOUNTER — Telehealth: Payer: Self-pay | Admitting: *Deleted

## 2015-08-27 NOTE — Telephone Encounter (Signed)
Clydie Braun, Postpartum Nurse with the [facility] Department, states pt scored an 11 on [place] scale, c/o symptoms of depression, irritability, and anxiety. Pt given an appt for tomorrow 08/27/2015 at 11:15 with Tobie Poet, CNM.

## 2015-08-28 ENCOUNTER — Ambulatory Visit: Payer: Self-pay | Admitting: Advanced Practice Midwife

## 2015-08-28 ENCOUNTER — Encounter: Payer: Self-pay | Admitting: Advanced Practice Midwife

## 2015-09-03 ENCOUNTER — Ambulatory Visit: Payer: Self-pay | Admitting: Advanced Practice Midwife

## 2015-09-04 ENCOUNTER — Ambulatory Visit: Payer: Self-pay | Admitting: Advanced Practice Midwife

## 2015-09-10 ENCOUNTER — Ambulatory Visit: Payer: Self-pay | Admitting: Adult Health

## 2015-09-16 ENCOUNTER — Ambulatory Visit: Payer: Medicaid Other | Admitting: Women's Health

## 2015-09-16 ENCOUNTER — Encounter: Payer: Self-pay | Admitting: *Deleted

## 2015-09-29 ENCOUNTER — Ambulatory Visit: Payer: Medicaid Other | Admitting: Women's Health

## 2015-09-30 ENCOUNTER — Ambulatory Visit: Payer: Medicaid Other | Admitting: Women's Health

## 2015-10-07 ENCOUNTER — Ambulatory Visit (INDEPENDENT_AMBULATORY_CARE_PROVIDER_SITE_OTHER): Payer: Medicaid Other | Admitting: Orthopaedic Surgery

## 2015-10-07 VITALS — BP 124/70 | HR 85 | Temp 98.1°F | Ht 61.0 in | Wt 131.0 lb

## 2015-10-07 DIAGNOSIS — M25521 Pain in right elbow: Secondary | ICD-10-CM | POA: Diagnosis not present

## 2015-10-07 MED ORDER — HYDROCODONE-ACETAMINOPHEN 5-325 MG PO TABS: 1.0000 | ORAL_TABLET | ORAL | Status: DC | PRN

## 2015-10-07 NOTE — Patient Instructions (Signed)
Continue exercises

## 2015-10-07 NOTE — Progress Notes (Signed)
Patient Susan [name], female DOB:09/06/1975, 40 y.o. VWU:[id number]  Chief Complaint  Patient presents with  . Follow-up    Right elbow    HPI  Susan [name] is a 40 y.o. 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  She has no redness, no trauma.  She is taking her medicine and doing her ice massage.  Arm Pain  There was no injury mechanism. The pain is present in the right elbow. The quality of the pain is described as aching and burning. The pain does not radiate. The pain is at a severity of 3/10. The pain is mild. The pain has been intermittent since the incident. She has tried ice, NSAIDs and rest for the symptoms. The treatment provided moderate relief.    Body mass index is 24.76 kg/(m^2).   Review of Systems  Constitutional:       Patient does not have Diabetes Mellitus. Patient does not have hypertension. Patient does not have COPD or shortness of breath. Patient does not have BMI > 35. Patient does not have current smoking history.    Past Medical History  Diagnosis Date  . Ectopic pregnancy   . Asthma   . Trichomonas vaginitis   . BV (bacterial vaginosis) 03/06/2013  . Chronic dental pain   . Panic attacks   . Chronic pelvic pain in female   . Depression   . Polysubstance abuse     cocaine, opiates, marijuana    Past Surgical History  Procedure Laterality Date  . Ectopic pregnancy surgery      Family History  Problem Relation Age of Onset  . COPD Mother   . Stroke Mother   . Heart disease Maternal Grandmother   . Hypertension Maternal Grandmother   . Stroke Maternal Grandfather   . Asthma Sister   . Down syndrome Sister     Social History Social History  Substance Use Topics  . Smoking status: Current Every Day Smoker -- 1.00 packs/day    Types: Cigarettes  . Smokeless tobacco: Never Used  . Alcohol Use: No    No Known Allergies  Current Outpatient Prescriptions  Medication Sig Dispense Refill   . albuterol (PROVENTIL HFA;VENTOLIN HFA) 108 (90 BASE) MCG/ACT inhaler Inhale 2 puffs into the lungs every 6 (six) hours as needed for wheezing or shortness of breath.    Marland Kitchen HYDROcodone-acetaminophen (NORCO/VICODIN) 5-325 MG tablet Take 1 tablet by mouth every 4 (four) hours as needed for moderate pain (Must last 30 days.  Do not take and drive a car or use machinery.). 120 tablet 0  . ibuprofen (ADVIL,MOTRIN) 600 MG tablet Take 1 tablet (600 mg total) by mouth every 6 (six) hours. (Patient not taking: Reported on 10/07/2015) 30 tablet 0  . Prenat w/o A-FeCbGl-DSS-FA-DHA (CITRANATAL ASSURE) 35-1 & 300 MG tablet One tablet and one capsule daily (Patient not taking: Reported on 10/07/2015) 60 tablet 11   No current facility-administered medications for this visit.     Physical Exam  Blood pressure 124/70, pulse 85, temperature 98.1 F (36.7 C), height  (1.549 m), weight 131 lb (59.421 kg), unknown if currently breastfeeding.  Constitutional: overall normal hygiene, normal nutrition, well developed, normal grooming, normal body habitus. Assistive device:none  Musculoskeletal: gait and station Limp none, muscle tone and strength are normal, no tremors or atrophy is present.  .  Neurological: coordination overall normal.  Deep tendon reflex/nerve stretch intact.  Sensation normal.  Cranial nerves II-XII intact.  Skin:   normal overall no scars, lesions, ulcers or rashes. No psoriasis.  Psychiatric: Alert and oriented x 3.  Recent memory intact, remote memory unclear.  Normal mood and affect. Well groomed.  Good eye contact.  Cardiovascular: overall no swelling, no varicosities, no edema bilaterally, normal temperatures of the legs and arms, no clubbing, cyanosis and good capillary refill.  Lymphatic: palpation is normal.   Extremities:the right elbow has pain and tenderness over the lateral epicondyle.  She has pain to passive extension resistance.  She has no redness.  The left elbow  is non tender Inspection normal both elbows Strength and tone both elbows Range of motion full of both elbows  Additional services performed: I went over ice massage techniques with her again.  The patient has been educated about the nature of the problem(s) and counseled on treatment options.  The patient appeared to understand what I have discussed and is in agreement with it.  PLAN Call if any problems.  Precautions discussed.  Continue current medications.   Return to clinic 3 months

## 2015-11-03 ENCOUNTER — Telehealth: Payer: Self-pay | Admitting: Orthopaedic Surgery

## 2015-11-03 NOTE — Telephone Encounter (Signed)
Patient requesting refill of Hydrocodone 5/325mg  Qty 120 Tablets °

## 2015-11-04 MED ORDER — HYDROCODONE-ACETAMINOPHEN 5-325 MG PO TABS: 1.0000 | ORAL_TABLET | ORAL | Status: DC | PRN

## 2015-11-04 NOTE — Telephone Encounter (Signed)
Rx done. 

## 2015-12-08 ENCOUNTER — Telehealth: Payer: Self-pay

## 2015-12-09 ENCOUNTER — Telehealth: Payer: Self-pay

## 2015-12-09 MED ORDER — HYDROCODONE-ACETAMINOPHEN 5-325 MG PO TABS: 1.0000 | ORAL_TABLET | ORAL | Status: DC | PRN

## 2015-12-09 NOTE — Telephone Encounter (Signed)
5-325 HYDROCODONE 120TABS

## 2015-12-09 NOTE — Telephone Encounter (Signed)
Rx done. 

## 2015-12-29 ENCOUNTER — Emergency Department (HOSPITAL_COMMUNITY): Payer: Medicaid Other

## 2015-12-29 ENCOUNTER — Encounter (HOSPITAL_COMMUNITY): Payer: Self-pay | Admitting: *Deleted

## 2015-12-29 ENCOUNTER — Emergency Department (HOSPITAL_COMMUNITY)
Admission: EM | Admit: 2015-12-29 | Discharge: 2015-12-30 | Disposition: A | Discharge status: Home / Self Care | Payer: Medicaid Other | Attending: Emergency Medicine | Admitting: Emergency Medicine

## 2015-12-29 DIAGNOSIS — F17210 Nicotine dependence, cigarettes, uncomplicated: Secondary | ICD-10-CM | POA: Insufficient documentation

## 2015-12-29 DIAGNOSIS — W57XXXA Bitten or stung by nonvenomous insect and other nonvenomous arthropods, initial encounter: Secondary | ICD-10-CM | POA: Diagnosis not present

## 2015-12-29 DIAGNOSIS — J45909 Unspecified asthma, uncomplicated: Secondary | ICD-10-CM | POA: Insufficient documentation

## 2015-12-29 DIAGNOSIS — Y999 Unspecified external cause status: Secondary | ICD-10-CM | POA: Insufficient documentation

## 2015-12-29 DIAGNOSIS — O9A211 Injury, poisoning and certain other consequences of external causes complicating pregnancy, first trimester: Secondary | ICD-10-CM | POA: Insufficient documentation

## 2015-12-29 DIAGNOSIS — O99331 Smoking (tobacco) complicating pregnancy, first trimester: Secondary | ICD-10-CM | POA: Diagnosis not present

## 2015-12-29 DIAGNOSIS — Z79899 Other long term (current) drug therapy: Secondary | ICD-10-CM | POA: Diagnosis not present

## 2015-12-29 DIAGNOSIS — Z3A01 Less than 8 weeks gestation of pregnancy: Secondary | ICD-10-CM | POA: Diagnosis not present

## 2015-12-29 DIAGNOSIS — F329 Major depressive disorder, single episode, unspecified: Secondary | ICD-10-CM | POA: Diagnosis not present

## 2015-12-29 DIAGNOSIS — Y929 Unspecified place or not applicable: Secondary | ICD-10-CM | POA: Insufficient documentation

## 2015-12-29 DIAGNOSIS — S70362A Insect bite (nonvenomous), left thigh, initial encounter: Secondary | ICD-10-CM | POA: Diagnosis not present

## 2015-12-29 DIAGNOSIS — Y939 Activity, unspecified: Secondary | ICD-10-CM | POA: Diagnosis not present

## 2015-12-29 DIAGNOSIS — O26892 Other specified pregnancy related conditions, second trimester: Secondary | ICD-10-CM | POA: Diagnosis present

## 2015-12-29 DIAGNOSIS — Z3490 Encounter for supervision of normal pregnancy, unspecified, unspecified trimester: Secondary | ICD-10-CM

## 2015-12-29 LAB — BASIC METABOLIC PANEL
Anion gap: 5 (ref 5–15)
BUN: 17 mg/dL (ref 6–20)
CO2: 24 mmol/L (ref 22–32)
Calcium: 8.8 mg/dL — ABNORMAL LOW (ref 8.9–10.3)
Chloride: 109 mmol/L (ref 101–111)
Creatinine, Ser: 0.66 mg/dL (ref 0.44–1.00)
GFR calc Af Amer: >60 mL/min (ref >60)
GFR calc non Af Amer: >60 mL/min (ref >60)
Glucose, Bld: 103 mg/dL — ABNORMAL HIGH (ref 65–99)
Potassium: 3.7 mmol/L (ref 3.5–5.1)
Sodium: 138 mmol/L (ref 135–145)

## 2015-12-29 LAB — URINALYSIS, ROUTINE W REFLEX MICROSCOPIC
Bilirubin Urine: NEGATIVE
Glucose, UA: NEGATIVE mg/dL
Hgb urine dipstick: NEGATIVE
Ketones, ur: NEGATIVE mg/dL
Leukocytes, UA: NEGATIVE
Nitrite: NEGATIVE
Protein, ur: NEGATIVE mg/dL
Specific Gravity, Urine: >1.03 — ABNORMAL HIGH (ref 1.005–1.030)
pH: 5.5 (ref 5.0–8.0)

## 2015-12-29 LAB — CBC WITH DIFFERENTIAL/PLATELET
Basophils Absolute: 0 10*3/uL (ref 0.0–0.1)
Basophils Relative: 0 %
Eosinophils Absolute: 0.2 10*3/uL (ref 0.0–0.7)
Eosinophils Relative: 2 %
HCT: 36.3 % (ref 36.0–46.0)
Hemoglobin: 12.2 g/dL (ref 12.0–15.0)
Lymphocytes Relative: 26 %
Lymphs Abs: 3 10*3/uL (ref 0.7–4.0)
MCH: 32.9 pg (ref 26.0–34.0)
MCHC: 33.6 g/dL (ref 30.0–36.0)
MCV: 97.8 fL (ref 78.0–100.0)
Monocytes Absolute: 0.8 10*3/uL (ref 0.1–1.0)
Monocytes Relative: 7 %
Neutro Abs: 7.6 10*3/uL (ref 1.7–7.7)
Neutrophils Relative %: 65 %
Platelets: 255 10*3/uL (ref 150–400)
RBC: 3.71 MIL/uL — ABNORMAL LOW (ref 3.87–5.11)
RDW: 12.6 % (ref 11.5–15.5)
WBC: 11.6 10*3/uL — ABNORMAL HIGH (ref 4.0–10.5)

## 2015-12-29 LAB — HCG, QUANTITATIVE, PREGNANCY: hCG, Beta Chain, Quant, S: 1186 m[IU]/mL — ABNORMAL HIGH (ref <5)

## 2015-12-29 LAB — PREGNANCY, URINE: Preg Test, Ur: POSITIVE — AB

## 2015-12-29 NOTE — ED Notes (Addendum)
Pt reports a whitish discharge and foul odor coming from her vagina x 2 weeks. Pt also has an abscess to her llq of her abdomen. Pt also c/o lower abdominal pain.

## 2015-12-29 NOTE — ED Provider Notes (Signed)
CSN: [id number]     Arrival date & time 12/29/15  1823 History  By signing my name below, I, Linus Galas, attest that this documentation has been prepared under the direction and in the presence of Vanetta Mulders, MD. Electronically Signed: Linus Galas, ED Scribe. 12/30/2015. 9:55 PM.   Chief Complaint  Patient presents with  . Vaginal Discharge   The history is provided by the patient. No language interpreter was used.   HPI Comments: Susan [name] is a 40 y.o. female with a PMHx of ectopic pregnancy, who presents to the Emergency Department complaining white vaginal discharge with foul odor for the past 2 weeks. Pt also reports an insect bite to her upper left leg 2 days ago. Since then she has pain, redness, green discharge, and swelling to the area where she was bit. Pt denies any itching. Pt has no other complaints at this time.   Pt was seen by Dr. Emelda Fear [facility] in the past.  LNMP 11/30/15  Past Medical History  Diagnosis Date  . Ectopic pregnancy   . Asthma   . Trichomonas vaginitis   . BV (bacterial vaginosis) 03/06/2013  . Chronic dental pain   . Panic attacks   . Chronic pelvic pain in female   . Depression   . Polysubstance abuse     cocaine, opiates, marijuana   Past Surgical History  Procedure Laterality Date  . Ectopic pregnancy surgery     Family History  Problem Relation Age of Onset  . COPD Mother   . Stroke Mother   . Heart disease Maternal Grandmother   . Hypertension Maternal Grandmother   . Stroke Maternal Grandfather   . Asthma Sister   . Down syndrome Sister    Social History  Substance Use Topics  . Smoking status: Current Every Day Smoker -- 1.00 packs/day    Types: Cigarettes  . Smokeless tobacco: Never Used  . Alcohol Use: Yes   OB History    Gravida Para Term Preterm AB TAB SAB Ectopic Multiple Living   7 4 4  3 2  1  0 4     Review of Systems  Constitutional: Negative for fever and chills.  HENT: Negative for rhinorrhea and  sore throat.   Eyes: Negative for visual disturbance.  Respiratory: Negative for cough and shortness of breath.   Cardiovascular: Negative for chest pain.  Gastrointestinal: Negative for nausea, vomiting and diarrhea.  Genitourinary: Positive for dysuria and vaginal discharge. Negative for vaginal bleeding.  Musculoskeletal: Positive for back pain. Negative for joint swelling.  Skin: Positive for rash.  Neurological: Positive for dizziness. Negative for headaches.  Hematological: Does not bruise/bleed easily.  Psychiatric/Behavioral: Negative for confusion.    Allergies  Review of patient's allergies indicates no known allergies.  Home Medications   Prior to Admission medications   Medication Sig Start Date End Date Taking? Authorizing Provider  albuterol (PROVENTIL HFA;VENTOLIN HFA) 108 (90 BASE) MCG/ACT inhaler Inhale 2 puffs into the lungs every 6 (six) hours as needed for wheezing or shortness of breath.   Yes Historical Provider, MD  HYDROcodone-acetaminophen (NORCO/VICODIN) 5-325 MG tablet Take 1 tablet by mouth every 4 (four) hours as needed for moderate pain (Must last 30 days.  Do not take and drive a car or use machinery.). 12/09/15  Yes Darreld Mclean, MD  cephALEXin (KEFLEX) 500 MG capsule Take 1 capsule (500 mg total) by mouth 4 (four) times daily. 12/30/15   Vanetta Mulders, MD  Prenatal Vit-Fe Fumarate-FA (PRENATAL VITAMINS) 28-0.8  MG TABS Take 1 tablet by mouth daily. 12/30/15   Vanetta MuldersScott Alla Sloma, MD   BP 115/68 mmHg  Pulse 65  Temp(Src) 98.9 F (37.2 C) (Oral)  Resp 16  Ht 5\' 1"  (1.549 m)  Wt 67.586 kg  BMI 28.17 kg/m2  SpO2 100%  LMP 11/30/2015   Physical Exam  Constitutional: She is oriented to person, place, and time. She appears well-developed and well-nourished.  HENT:  Head: Normocephalic and atraumatic.  Cardiovascular: Normal rate, regular rhythm and normal heart sounds.   Pulmonary/Chest: Effort normal and breath sounds normal. No respiratory distress.   Abdominal: Soft. Bowel sounds are normal. There is no tenderness.  Musculoskeletal: She exhibits no edema.  5 mm scabbed area, 3 cm area of redness, and  3 cm area of induration to the left flank  Neurological: She is alert and oriented to person, place, and time. No cranial nerve deficit. She exhibits normal muscle tone. Coordination normal.  Skin: Skin is warm and dry.  Psychiatric: She has a normal mood and affect.  Nursing note and vitals reviewed.   ED Course  Procedures  DIAGNOSTIC STUDIES: Oxygen Saturation is 99% on room air, normal by my interpretation.    COORDINATION OF CARE: 9:46 PM Discussed treatment plan with pt at bedside and pt agreed to plan.  Labs Review Labs Reviewed  PREGNANCY, URINE - Abnormal; Notable for the following:    Preg Test, Ur POSITIVE (*)    All other components within normal limits  URINALYSIS, ROUTINE W REFLEX MICROSCOPIC (NOT AT [facility]) - Abnormal; Notable for the following:    Specific Gravity, Urine >1.030 (*)    All other components within normal limits  CBC WITH DIFFERENTIAL/PLATELET - Abnormal; Notable for the following:    WBC 11.6 (*)    RBC 3.71 (*)    All other components within normal limits  BASIC METABOLIC PANEL - Abnormal; Notable for the following:    Glucose, Bld 103 (*)    Calcium 8.8 (*)    All other components within normal limits  HCG, QUANTITATIVE, PREGNANCY - Abnormal; Notable for the following:    hCG, Beta Chain, Quant, S 1186 (*)    All other components within normal limits   Results for orders placed or performed during the hospital encounter of 12/29/15  Pregnancy, urine  Result Value Ref Range   Preg Test, Ur POSITIVE (A) NEGATIVE  Urinalysis, Routine w reflex microscopic (not at [facility])  Result Value Ref Range   Color, Urine YELLOW YELLOW   APPearance CLEAR CLEAR   Specific Gravity, Urine >1.030 (H) 1.005 - 1.030   pH 5.5 5.0 - 8.0   Glucose, UA NEGATIVE NEGATIVE mg/dL   Hgb urine dipstick NEGATIVE NEGATIVE    Bilirubin Urine NEGATIVE NEGATIVE   Ketones, ur NEGATIVE NEGATIVE mg/dL   Protein, ur NEGATIVE NEGATIVE mg/dL   Nitrite NEGATIVE NEGATIVE   Leukocytes, UA NEGATIVE NEGATIVE  CBC with Differential/Platelet  Result Value Ref Range   WBC 11.6 (H) 4.0 - 10.5 K/uL   RBC 3.71 (L) 3.87 - 5.11 MIL/uL   Hemoglobin 12.2 12.0 - 15.0 g/dL   HCT 78.236.3 95.636.0 - 21.346.0 %   MCV 97.8 78.0 - 100.0 fL   MCH 32.9 26.0 - 34.0 pg   MCHC 33.6 30.0 - 36.0 g/dL   RDW 08.612.6 57.811.5 - 46.915.5 %   Platelets 255 150 - 400 K/uL   Neutrophils Relative % 65 %   Neutro Abs 7.6 1.7 - 7.7 K/uL   Lymphocytes Relative  26 %   Lymphs Abs 3.0 0.7 - 4.0 K/uL   Monocytes Relative 7 %   Monocytes Absolute 0.8 0.1 - 1.0 K/uL   Eosinophils Relative 2 %   Eosinophils Absolute 0.2 0.0 - 0.7 K/uL   Basophils Relative 0 %   Basophils Absolute 0.0 0.0 - 0.1 K/uL  Basic metabolic panel  Result Value Ref Range   Sodium 138 135 - 145 mmol/L   Potassium 3.7 3.5 - 5.1 mmol/L   Chloride 109 101 - 111 mmol/L   CO2 24 22 - 32 mmol/L   Glucose, Bld 103 (H) 65 - 99 mg/dL   BUN 17 6 - 20 mg/dL   Creatinine, Ser 1.61 0.44 - 1.00 mg/dL   Calcium 8.8 (L) 8.9 - 10.3 mg/dL   GFR calc non Af Amer >60 >60 mL/min   GFR calc Af Amer >60 >60 mL/min   Anion gap 5 5 - 15  hCG, quantitative, pregnancy  Result Value Ref Range   hCG, Beta Chain, Quant, S 1186 (H) <5 mIU/mL     Imaging Review US Ob Comp Less 14 Wks  12/30/2015  CLINICAL DATA:  Abdominal pain vaginal discharge 2 weeks. Patient is pregnant. Quantitative beta HCG is 1,186. Estimated gestational age [redacted] weeks 1 day per LMP. History of previous ectopic pregnancy. EXAM: OBSTETRIC <14 WK ULTRASOUND TECHNIQUE: Transabdominal ultrasound was performed for evaluation of the gestation as well as the maternal uterus and adnexal regions. COMPARISON:  None. FINDINGS: Intrauterine gestational sac: Not visualized. Yolk sac:  Not visualized. Embryo:  Not visualized. Cardiac Activity: Not visualized. Heart  Rate: Not visualized. MSD: Not visualized. CRL:   Not visualized. Maternal uterus/adnexae: Uterus measures 4.9 x 6.9 x 9 cm. Endometrial thickness is 12 mm. Ovaries are normal size, shape and position with normal color flow. No significant free pelvic fluid. IMPRESSION: No evidence of intrauterine gestational sac. Otherwise, normal pelvic ultrasound. Ectopic pregnancy is not completely excluded. Recommend follow-up serial quantitative beta HCG and follow-up ultrasound in 2 weeks. Electronically Signed   By: Elberta Fortis M.D.   On: 12/30/2015 00:00   US Ob Transvaginal  12/30/2015  CLINICAL DATA:  Abdominal pain vaginal discharge 2 weeks. Patient is pregnant. Quantitative beta HCG is 1,186. Estimated gestational age [redacted] weeks 1 day per LMP. History of previous ectopic pregnancy. EXAM: OBSTETRIC <14 WK ULTRASOUND TECHNIQUE: Transabdominal ultrasound was performed for evaluation of the gestation as well as the maternal uterus and adnexal regions. COMPARISON:  None. FINDINGS: Intrauterine gestational sac: Not visualized. Yolk sac:  Not visualized. Embryo:  Not visualized. Cardiac Activity: Not visualized. Heart Rate: Not visualized. MSD: Not visualized. CRL:   Not visualized. Maternal uterus/adnexae: Uterus measures 4.9 x 6.9 x 9 cm. Endometrial thickness is 12 mm. Ovaries are normal size, shape and position with normal color flow. No significant free pelvic fluid. IMPRESSION: No evidence of intrauterine gestational sac. Otherwise, normal pelvic ultrasound. Ectopic pregnancy is not completely excluded. Recommend follow-up serial quantitative beta HCG and follow-up ultrasound in 2 weeks. Electronically Signed   By: Elberta Fortis M.D.   On: 12/30/2015 00:00   I have personally reviewed and evaluated these images and lab results as part of my medical decision-making.   EKG Interpretation None      MDM   Final diagnoses:  Pregnancy  Insect bite    Patient presented with concerns for an insect bite that her  waistline only left flank area. Area of redness and induration no evidence of any deep abscess. Could  be spider bite could've been a tick bite. Patient without any other symptomatic concerns. Patient secondarily raise concerns for a whitish vaginal discharge. But no real significant pelvic pain. Maybe some mild suprapubic discomfort no vaginal bleeding. Last menstrual period was the end of April. Patient's pregnancy test is positive. Patient's had a history of an ectopic pregnancy in the past with surgery. But has had a normal delivery since then.  Patient will be referred to OB/GYN she's been followed by Dr. Emelda Fear in the past. Started on prenatal vitamins. Not able to treat her with doxycycline or sulfur antibiotic due to the pregnancy so will treat with Keflex. Warm soaks sitz baths for the insect bite. Patient will return if infection starts to spread in that area. Hopefully it'll become under control with the antibiotics. As stated above no indication for incision and drainage of any abscess at this point in time.  For the pregnancy. It appears to be early on based on the quantitative hCG which is 1186. Ultrasound shows no evidence of intrauterine gestational sac. However patient is probably early on. Ectopic not ruled out will require close follow-up by OB/GYN. Urinalysis is negative and shows no signs of urinary tract infection.   I personally performed the services described in this documentation, which was scribed in my presence. The recorded information has been reviewed and is accurate.      Vanetta Mulders, MD 12/30/15 [phone]

## 2015-12-29 NOTE — ED Notes (Signed)
MD at bedside. 

## 2015-12-30 MED ORDER — CEPHALEXIN 500 MG PO CAPS: 500.0000 mg | ORAL_CAPSULE | Freq: Four times a day (QID) | ORAL | Status: DC

## 2015-12-30 MED ORDER — PRENATAL VITAMINS 28-0.8 MG PO TABS: 1.0000 | ORAL_TABLET | Freq: Every day | ORAL | Status: DC

## 2015-12-30 MED ORDER — CEPHALEXIN 500 MG PO CAPS
500.0000 mg | ORAL_CAPSULE | Freq: Once | ORAL | Status: AC
  Administered 2015-12-30: 500 mg via ORAL
  Filled 2015-12-30: qty 1

## 2015-12-30 NOTE — Discharge Instructions (Signed)
First Trimester of Pregnancy The first trimester of pregnancy is from week 1 until the end of week 12 (months 1 through 3). During this time, your baby will begin to develop inside you. At 6-8 weeks, the eyes and face are formed, and the heartbeat can be seen on ultrasound. At the end of 12 weeks, all the baby's organs are formed. Prenatal care is all the medical care you receive before the birth of your baby. Make sure you get good prenatal care and follow all of your doctor's instructions. HOME CARE  Medicines  Take medicine only as told by your doctor. Some medicines are safe and some are not during pregnancy.  Take your prenatal vitamins as told by your doctor.  Take medicine that helps you poop (stool softener) as needed if your doctor says it is okay. Diet  Eat regular, healthy meals.  Your doctor will tell you the amount of weight gain that is right for you.  Avoid raw meat and uncooked cheese.  If you feel sick to your stomach (nauseous) or throw up (vomit):  Eat 4 or 5 small meals a day instead of 3 large meals.  Try eating a few soda crackers.  Drink liquids between meals instead of during meals.  If you have a hard time pooping (constipation):  Eat high-fiber foods like fresh vegetables, fruit, and whole grains.  Drink enough fluids to keep your pee (urine) clear or pale yellow. Activity and Exercise  Exercise only as told by your doctor. Stop exercising if you have cramps or pain in your lower belly (abdomen) or low back.  Try to avoid standing for long periods of time. Move your legs often if you must stand in one place for a long time.  Avoid heavy lifting.  Wear low-heeled shoes. Sit and stand up straight.  You can have sex unless your doctor tells you not to. Relief of Pain or Discomfort  Wear a good support bra if your breasts are sore.  Take warm water baths (sitz baths) to soothe pain or discomfort caused by hemorrhoids. Use hemorrhoid cream if your  doctor says it is okay.  Rest with your legs raised if you have leg cramps or low back pain.  Wear support hose if you have puffy, bulging veins (varicose veins) in your legs. Raise (elevate) your feet for 15 minutes, 3-4 times a day. Limit salt in your diet. Prenatal Care  Schedule your prenatal visits by the twelfth week of pregnancy.  Write down your questions. Take them to your prenatal visits.  Keep all your prenatal visits as told by your doctor. Safety  Wear your seat belt at all times when driving.  Make a list of emergency phone numbers. The list should include numbers for family, friends, the hospital, and police and fire departments. General Tips  Ask your doctor for a referral to a local prenatal class. Begin classes no later than at the start of month 6 of your pregnancy.  Ask for help if you need counseling or help with nutrition. Your doctor can give you advice or tell you where to go for help.  Do not use hot tubs, steam rooms, or saunas.  Do not douche or use tampons or scented sanitary pads.  Do not cross your legs for long periods of time.  Avoid litter boxes and soil used by cats.  Avoid all smoking, herbs, and alcohol. Avoid drugs not approved by your doctor.  Do not use any tobacco products, including cigarettes,  chewing tobacco, and electronic cigarettes. If you need help quitting, ask your doctor. You may get counseling or other support to help you quit.  Visit your dentist. At home, brush your teeth with a soft toothbrush. Be gentle when you floss. GET HELP IF:  You are dizzy.  You have mild cramps or pressure in your lower belly.  You have a nagging pain in your belly area.  You continue to feel sick to your stomach, throw up, or have watery poop (diarrhea).  You have a bad smelling fluid coming from your vagina.  You have pain with peeing (urination).  You have increased puffiness (swelling) in your face, hands, legs, or ankles. GET HELP  RIGHT AWAY IF:   You have a fever.  You are leaking fluid from your vagina.  You have spotting or bleeding from your vagina.  You have very bad belly cramping or pain.  You gain or lose weight rapidly.  You throw up blood. It may look like coffee grounds.  You are around people who have [place] measles, fifth disease, or chickenpox.  You have a very bad headache.  You have shortness of breath.  You have any kind of trauma, such as from a fall or a car accident.   This information is not intended to replace advice given to you by your health care provider. Make sure you discuss any questions you have with your health care provider.   Take antibiotic as directed for the bug bite. Make an appointment with the OB/GYN for follow-up. Take your prenatal vitamins. Return for any vaginal bleeding or worse lower abdominal pelvic pain. Also return if the bug bite infection gets worse.      Document Released: 01/12/2008 Document Revised: 08/16/2014 Document Reviewed: 06/05/2013 Elsevier Interactive Patient Education [employer]

## 2016-01-06 ENCOUNTER — Encounter: Payer: Self-pay | Admitting: Orthopaedic Surgery

## 2016-01-06 ENCOUNTER — Ambulatory Visit (INDEPENDENT_AMBULATORY_CARE_PROVIDER_SITE_OTHER): Payer: Medicaid Other | Admitting: Orthopaedic Surgery

## 2016-01-06 VITALS — BP 102/71 | HR 93 | Temp 97.9°F | Resp 16 | Ht 61.0 in | Wt 132.0 lb

## 2016-01-06 DIAGNOSIS — M25521 Pain in right elbow: Secondary | ICD-10-CM

## 2016-01-06 MED ORDER — HYDROCODONE-ACETAMINOPHEN 5-325 MG PO TABS: 1.0000 | ORAL_TABLET | ORAL | Status: DC | PRN

## 2016-01-06 NOTE — Progress Notes (Signed)
Patient ZO:XWRUEA:Susan [name], female DOB:10/15/1975, 40 y.o. VWU:[id number]  Chief Complaint  Patient presents with  . Follow-up    Right elbow pain    HPI  Susan [name] is a 40 y.o. 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  She has been doing exercises and the ice massage.  She has no new trauma., no redness, no paresthesias.  HPI  Body mass index is 24.95 kg/(m^2).  ROS  Review of Systems  Constitutional:       Patient does not have Diabetes Mellitus. Patient does not have hypertension. Patient does not have COPD or shortness of breath. Patient does not have BMI > 35. Patient does not have current smoking history.  HENT: Negative for congestion.   Respiratory: Negative for cough and shortness of breath.   Cardiovascular: Negative for chest pain and leg swelling.  Endocrine: Negative for cold intolerance.  Musculoskeletal: Positive for arthralgias.  Allergic/Immunologic: Negative for environmental allergies.    Past Medical History  Diagnosis Date  . Ectopic pregnancy   . Asthma   . Trichomonas vaginitis   . BV (bacterial vaginosis) 03/06/2013  . Chronic dental pain   . Panic attacks   . Chronic pelvic pain in female   . Depression   . Polysubstance abuse     cocaine, opiates, marijuana    Past Surgical History  Procedure Laterality Date  . Ectopic pregnancy surgery      Family History  Problem Relation Age of Onset  . COPD Mother   . Stroke Mother   . Heart disease Maternal Grandmother   . Hypertension Maternal Grandmother   . Stroke Maternal Grandfather   . Asthma Sister   . Down syndrome Sister     Social History Social History  Substance Use Topics  . Smoking status: Current Every Day Smoker -- 1.00 packs/day    Types: Cigarettes  . Smokeless tobacco: Never Used  . Alcohol Use: Yes    No Known Allergies  Current Outpatient Prescriptions  Medication Sig Dispense Refill  . albuterol (PROVENTIL HFA;VENTOLIN HFA) 108 (90 BASE) MCG/ACT  inhaler Inhale 2 puffs into the lungs every 6 (six) hours as needed for wheezing or shortness of breath.    . cephALEXin (KEFLEX) 500 MG capsule Take 1 capsule (500 mg total) by mouth 4 (four) times daily. 28 capsule 0  . HYDROcodone-acetaminophen (NORCO/VICODIN) 5-325 MG tablet Take 1 tablet by mouth every 4 (four) hours as needed for moderate pain (Must last 30 days.  Do not take and drive a car or use machinery.). 120 tablet 0  . Prenatal Vit-Fe Fumarate-FA (PRENATAL VITAMINS) 28-0.8 MG TABS Take 1 tablet by mouth daily. 30 tablet 2   No current facility-administered medications for this visit.     Physical Exam  Blood pressure 102/71, pulse 93, temperature 97.9 F (36.6 C), resp. rate 16, height 5\' 1"  (1.549 m), weight 132 lb (59.875 kg), last menstrual period 12/22/2015, unknown if currently breastfeeding.  Constitutional: overall normal hygiene, normal nutrition, well developed, normal grooming, normal body habitus. Assistive device:none  Musculoskeletal: gait and station Limp none, muscle tone and strength are normal, no tremors or atrophy is present.  .  Neurological: coordination overall normal.  Deep tendon reflex/nerve stretch intact.  Sensation normal.  Cranial nerves II-XII intact.   Skin:   normal overall no scars, lesions, ulcers or rashes. No psoriasis.  Psychiatric: Alert and oriented x 3.  Recent memory intact, remote memory unclear.  Normal mood and affect. Well groomed.  Good eye contact.  Cardiovascular: overall no swelling, no varicosities, no edema bilaterally, normal temperatures of the legs and arms, no clubbing, cyanosis and good capillary refill.  Lymphatic: palpation is normal.  She has pain over the right epicondyle laterally with increased pain to wrist extension.  NV intact.  Left elbow negative.   The patient has been educated about the nature of the problem(s) and counseled on treatment options.  The patient appeared to understand what I have discussed  and is in agreement with it.  Encounter Diagnosis  Name Primary?  . Right elbow pain Yes    PLAN Call if any problems.  Precautions discussed.  Continue current medications.   Return to clinic 3 months

## 2016-01-11 ENCOUNTER — Encounter (HOSPITAL_COMMUNITY): Payer: Self-pay | Admitting: Emergency Medicine

## 2016-01-11 ENCOUNTER — Emergency Department (HOSPITAL_COMMUNITY)
Admission: EM | Admit: 2016-01-11 | Discharge: 2016-01-11 | Disposition: A | Discharge status: Home / Self Care | Payer: Medicaid Other | Attending: Emergency Medicine | Admitting: Emergency Medicine

## 2016-01-11 DIAGNOSIS — O99331 Smoking (tobacco) complicating pregnancy, first trimester: Secondary | ICD-10-CM | POA: Insufficient documentation

## 2016-01-11 DIAGNOSIS — F329 Major depressive disorder, single episode, unspecified: Secondary | ICD-10-CM | POA: Diagnosis not present

## 2016-01-11 DIAGNOSIS — F17210 Nicotine dependence, cigarettes, uncomplicated: Secondary | ICD-10-CM | POA: Insufficient documentation

## 2016-01-11 DIAGNOSIS — J45909 Unspecified asthma, uncomplicated: Secondary | ICD-10-CM | POA: Insufficient documentation

## 2016-01-11 DIAGNOSIS — O039 Complete or unspecified spontaneous abortion without complication: Secondary | ICD-10-CM | POA: Diagnosis not present

## 2016-01-11 DIAGNOSIS — Z3A01 Less than 8 weeks gestation of pregnancy: Secondary | ICD-10-CM | POA: Insufficient documentation

## 2016-01-11 DIAGNOSIS — Z79899 Other long term (current) drug therapy: Secondary | ICD-10-CM | POA: Insufficient documentation

## 2016-01-11 DIAGNOSIS — O200 Threatened abortion: Secondary | ICD-10-CM | POA: Diagnosis present

## 2016-01-11 LAB — CBC WITH DIFFERENTIAL/PLATELET
Basophils Absolute: 0 10*3/uL (ref 0.0–0.1)
Basophils Relative: 0 %
Eosinophils Absolute: 0.1 10*3/uL (ref 0.0–0.7)
Eosinophils Relative: 1 %
HCT: 37.2 % (ref 36.0–46.0)
Hemoglobin: 12.3 g/dL (ref 12.0–15.0)
Lymphocytes Relative: 16 %
Lymphs Abs: 1.5 10*3/uL (ref 0.7–4.0)
MCH: 32.4 pg (ref 26.0–34.0)
MCHC: 33.1 g/dL (ref 30.0–36.0)
MCV: 97.9 fL (ref 78.0–100.0)
Monocytes Absolute: 0.5 10*3/uL (ref 0.1–1.0)
Monocytes Relative: 5 %
Neutro Abs: 7 10*3/uL (ref 1.7–7.7)
Neutrophils Relative %: 78 %
Platelets: 322 10*3/uL (ref 150–400)
RBC: 3.8 MIL/uL — ABNORMAL LOW (ref 3.87–5.11)
RDW: 13 % (ref 11.5–15.5)
WBC: 9 10*3/uL (ref 4.0–10.5)

## 2016-01-11 LAB — BASIC METABOLIC PANEL
Anion gap: 5 (ref 5–15)
BUN: 17 mg/dL (ref 6–20)
CO2: 24 mmol/L (ref 22–32)
Calcium: 8.4 mg/dL — ABNORMAL LOW (ref 8.9–10.3)
Chloride: 107 mmol/L (ref 101–111)
Creatinine, Ser: 0.83 mg/dL (ref 0.44–1.00)
GFR calc Af Amer: >60 mL/min (ref >60)
GFR calc non Af Amer: >60 mL/min (ref >60)
Glucose, Bld: 200 mg/dL — ABNORMAL HIGH (ref 65–99)
Potassium: 4 mmol/L (ref 3.5–5.1)
Sodium: 136 mmol/L (ref 135–145)

## 2016-01-11 LAB — HCG, QUANTITATIVE, PREGNANCY: hCG, Beta Chain, Quant, S: 286 m[IU]/mL — ABNORMAL HIGH (ref <5)

## 2016-01-11 NOTE — ED Provider Notes (Signed)
CSN: [id number]     Arrival date & time 01/11/16  1500 History   First MD Initiated Contact with Patient 01/11/16 1614     Chief Complaint  Patient presents with  . Threatened Miscarriage     (Consider location/radiation/quality/duration/timing/severity/associated sxs/prior Treatment) HPI   Susan [name] is a 40 y.o. female who is here for evaluation of vaginal bleeding, over the last several days. She denies abdominal pain. She has mild dizziness which occurred while she was walking here to the emergency department from her home. She has not had any fever, chills, cough, shortness of breath, chest pain, or paresthesia. She has had 7 pregnancies, 4 live births, and 2 elective abortions. She was in the ED, 12/29/2015, at that time complaining of vaginal discharge. Screening evaluation revealed she was pregnant and the ultrasound was done, which showed an empty uterus. Quantitative hCG at that time was 1100. She did not follow-up with structures, for further care as directed. There are no other known modifying factors.  Past Medical History  Diagnosis Date  . Ectopic pregnancy   . Asthma   . Trichomonas vaginitis   . BV (bacterial vaginosis) 03/06/2013  . Chronic dental pain   . Panic attacks   . Chronic pelvic pain in female   . Depression   . Polysubstance abuse     cocaine, opiates, marijuana   Past Surgical History  Procedure Laterality Date  . Ectopic pregnancy surgery     Family History  Problem Relation Age of Onset  . COPD Mother   . Stroke Mother   . Heart disease Maternal Grandmother   . Hypertension Maternal Grandmother   . Stroke Maternal Grandfather   . Asthma Sister   . Down syndrome Sister    Social History  Substance Use Topics  . Smoking status: Current Every Day Smoker -- 1.00 packs/day    Types: Cigarettes  . Smokeless tobacco: Never Used  . Alcohol Use: Yes   OB History    Gravida Para Term Preterm AB TAB SAB Ectopic Multiple Living   7 4 4  3 2  1  0  4     Review of Systems  All other systems reviewed and are negative.     Allergies  Review of patient's allergies indicates no known allergies.  Home Medications   Prior to Admission medications   Medication Sig Start Date End Date Taking? Authorizing Provider  cephALEXin (KEFLEX) 500 MG capsule Take 1 capsule (500 mg total) by mouth 4 (four) times daily. 12/30/15  Yes Vanetta MuldersScott Zackowski, MD  HYDROcodone-acetaminophen (NORCO/VICODIN) 5-325 MG tablet Take 1 tablet by mouth every 4 (four) hours as needed for moderate pain (Must last 30 days.  Do not take and drive a car or use machinery.). 01/06/16   Darreld McleanWayne Keeling, MD  Prenatal Vit-Fe Fumarate-FA (PRENATAL VITAMINS) 28-0.8 MG TABS Take 1 tablet by mouth daily. 12/30/15   Vanetta MuldersScott Zackowski, MD   BP 124/60 mmHg  Pulse 87  Temp(Src) 98.1 F (36.7 C) (Oral)  Resp 18  Ht 5\' 1"  (1.549 m)  Wt 140 lb (63.504 kg)  BMI 26.47 kg/m2  SpO2 100%  LMP 12/22/2015 Physical Exam  Constitutional: She is oriented to person, place, and time. She appears well-developed and well-nourished.  HENT:  Head: Normocephalic and atraumatic.  Right Ear: External ear normal.  Left Ear: External ear normal.  Eyes: Conjunctivae and EOM are normal. Pupils are equal, round, and reactive to light.  Neck: Normal range of motion and phonation normal.  Neck supple.  Cardiovascular: Normal rate, regular rhythm and normal heart sounds.   Pulmonary/Chest: Effort normal and breath sounds normal. She exhibits no bony tenderness.  Abdominal: Soft. She exhibits no distension. There is no tenderness. There is no guarding.  Musculoskeletal: Normal range of motion.  Neurological: She is alert and oriented to person, place, and time. No cranial nerve deficit or sensory deficit. She exhibits normal muscle tone. Coordination normal.  Skin: Skin is warm, dry and intact.  Psychiatric: She has a normal mood and affect. Her behavior is normal. Judgment and thought content normal.  Nursing  note and vitals reviewed.   ED Course  Procedures (including critical care time) Medications - No data to display  Patient Vitals for the past 24 hrs:  BP Temp Temp src Pulse Resp SpO2 Height Weight  01/11/16 1523 124/60 mmHg 98.1 F (36.7 C) Oral 87 18 100 %  (1.549 m) 140 lb (63.504 kg)   Case was discussed with the on-call obstetrician, Dr. Despina Hidden. He states the patient can be safely discharged home at this time, with expectant management.  At discharge- Reevaluation with update and discussion. After initial assessment and treatment, an updated evaluation reveals she remains comfortable. Findings discussed with the patient and all questions were answered. Susan [name]    Labs Review Labs Reviewed  CBC WITH DIFFERENTIAL/PLATELET - Abnormal; Notable for the following:    RBC 3.80 (*)    All other components within normal limits  HCG, QUANTITATIVE, PREGNANCY - Abnormal; Notable for the following:    hCG, Beta Chain, Quant, S 286 (*)    All other components within normal limits  BASIC METABOLIC PANEL - Abnormal; Notable for the following:    Glucose, Bld 200 (*)    Calcium 8.4 (*)    All other components within normal limits    Imaging Review No results found. I have personally reviewed and evaluated these images and lab results as part of my medical decision-making.   EKG Interpretation None      MDM   Final diagnoses:  Spontaneous abortion    Spontaneous abortion, apparently uncomplicated. Significant lowering of her quantitative hCG, since 12/30/2015. Doubt retained products of conception, pelvic infection or impending vascular collapse.  Nursing Notes Reviewed/ Care Coordinated Applicable Imaging Reviewed Interpretation of Laboratory Data incorporated into ED treatment  The patient appears reasonably screened and/or stabilized for discharge and I doubt any other medical condition or other [facility] requiring further screening, evaluation, or treatment in the ED  at this time prior to discharge.  Plan: Home Medications- none; Home Treatments- rest, fluids; return here if the recommended treatment, does not improve the symptoms; Recommended follow up- PCP prn     Mancel Bale, MD 01/11/16 1759

## 2016-01-11 NOTE — ED Notes (Signed)
Pt states she has been having vaginal bleeding for over a week.  States she is about [redacted] weeks pregnant, but only had hcg done here.

## 2016-01-11 NOTE — Discharge Instructions (Signed)
Miscarriage  A miscarriage is the sudden loss of an unborn baby (fetus) before the 20th week of pregnancy. Most miscarriages happen in the first 3 months of pregnancy. Sometimes, it happens before a woman even knows she is pregnant. A miscarriage is also called a "spontaneous miscarriage" or "early pregnancy loss." Having a miscarriage can be an emotional experience. Talk with your caregiver about any questions you may have about miscarrying, the grieving process, and your future pregnancy plans.  CAUSES    Problems with the fetal chromosomes that make it impossible for the baby to develop normally. Problems with the baby's genes or chromosomes are most often the result of errors that occur, by chance, as the embryo divides and grows. The problems are not inherited from the parents.   Infection of the cervix or uterus.    Hormone problems.    Problems with the cervix, such as having an incompetent cervix. This is when the tissue in the cervix is not strong enough to hold the pregnancy.    Problems with the uterus, such as an abnormally shaped uterus, uterine fibroids, or congenital abnormalities.    Certain medical conditions.    Smoking, drinking alcohol, or taking illegal drugs.    Trauma.   Often, the cause of a miscarriage is unknown.   SYMPTOMS    Vaginal bleeding or spotting, with or without cramps or pain.   Pain or cramping in the abdomen or lower back.   Passing fluid, tissue, or blood clots from the vagina.  DIAGNOSIS   Your caregiver will perform a physical exam. You may also have an ultrasound to confirm the miscarriage. Blood or urine tests may also be ordered.  TREATMENT    Sometimes, treatment is not necessary if you naturally pass all the fetal tissue that was in the uterus. If some of the fetus or placenta remains in the body (incomplete miscarriage), tissue left behind may become infected and must be removed. Usually, a dilation and curettage (D and C) procedure is performed.  During a D and C procedure, the cervix is widened (dilated) and any remaining fetal or placental tissue is gently removed from the uterus.   Antibiotic medicines are prescribed if there is an infection. Other medicines may be given to reduce the size of the uterus (contract) if there is a lot of bleeding.   If you have Rh negative blood and your baby was Rh positive, you will need a Rh immunoglobulin shot. This shot will protect any future baby from having Rh blood problems in future pregnancies.  HOME CARE INSTRUCTIONS    Your caregiver may order bed rest or may allow you to continue light activity. Resume activity as directed by your caregiver.   Have someone help with home and family responsibilities during this time.    Keep track of the number of sanitary pads you use each day and how soaked (saturated) they are. Write down this information.    Do not use tampons. Do not douche or have sexual intercourse until approved by your caregiver.    Only take over-the-counter or prescription medicines for pain or discomfort as directed by your caregiver.    Do not take aspirin. Aspirin can cause bleeding.    Keep all follow-up appointments with your caregiver.    If you or your partner have problems with grieving, talk to your caregiver or seek counseling to help cope with the pregnancy loss. Allow enough time to grieve before trying to get pregnant again.     SEEK IMMEDIATE MEDICAL CARE IF:    You have severe cramps or pain in your back or abdomen.   You have a fever.   You pass large blood clots (walnut-sized or larger) ortissue from your vagina. Save any tissue for your caregiver to inspect.    Your bleeding increases.    You have a thick, bad-smelling vaginal discharge.   You become lightheaded, weak, or you faint.    You have chills.   MAKE SURE YOU:   Understand these instructions.   Will watch your condition.   Will get help right away if you are not doing well or get worse.     This  information is not intended to replace advice given to you by your health care provider. Make sure you discuss any questions you have with your health care provider.     Document Released: 01/19/2001 Document Revised: 11/20/2012 Document Reviewed: 09/14/2011  Elsevier Interactive Patient Education 2016 [employer]

## 2016-01-15 ENCOUNTER — Encounter (HOSPITAL_COMMUNITY): Payer: Self-pay

## 2016-01-15 ENCOUNTER — Emergency Department (HOSPITAL_COMMUNITY)
Admission: EM | Admit: 2016-01-15 | Discharge: 2016-01-15 | Disposition: A | Discharge status: Home / Self Care | Payer: Medicaid Other | Attending: Emergency Medicine | Admitting: Emergency Medicine

## 2016-01-15 DIAGNOSIS — F17210 Nicotine dependence, cigarettes, uncomplicated: Secondary | ICD-10-CM | POA: Diagnosis not present

## 2016-01-15 DIAGNOSIS — O039 Complete or unspecified spontaneous abortion without complication: Secondary | ICD-10-CM

## 2016-01-15 DIAGNOSIS — J45909 Unspecified asthma, uncomplicated: Secondary | ICD-10-CM | POA: Diagnosis not present

## 2016-01-15 DIAGNOSIS — Z3A01 Less than 8 weeks gestation of pregnancy: Secondary | ICD-10-CM | POA: Insufficient documentation

## 2016-01-15 DIAGNOSIS — N939 Abnormal uterine and vaginal bleeding, unspecified: Secondary | ICD-10-CM | POA: Diagnosis present

## 2016-01-15 DIAGNOSIS — F329 Major depressive disorder, single episode, unspecified: Secondary | ICD-10-CM | POA: Insufficient documentation

## 2016-01-15 DIAGNOSIS — Z79899 Other long term (current) drug therapy: Secondary | ICD-10-CM | POA: Insufficient documentation

## 2016-01-15 DIAGNOSIS — Z791 Long term (current) use of non-steroidal anti-inflammatories (NSAID): Secondary | ICD-10-CM | POA: Insufficient documentation

## 2016-01-15 LAB — WET PREP, GENITAL
Sperm: NONE SEEN
Trich, Wet Prep: NONE SEEN
Yeast Wet Prep HPF POC: NONE SEEN

## 2016-01-15 MED ORDER — IBUPROFEN 800 MG PO TABS: 800.0000 mg | ORAL_TABLET | Freq: Three times a day (TID) | ORAL | Status: DC

## 2016-01-15 NOTE — ED Provider Notes (Signed)
CSN: [id number]     Arrival date & time 01/15/16  1611 History   First MD Initiated Contact with Patient 01/15/16 1724     Chief Complaint  Patient presents with  . Vaginal Bleeding     (Consider location/radiation/quality/duration/timing/severity/associated sxs/prior Treatment) HPI  The patient is a 40 year old female, she is known to be pregnant over the last month, she had a quantified hCG at just over 1000 in May, it had come down to below 300 as of 4 days ago, she has had ongoing bleeding a small amount of cramping in her lower abdomen. She states that her symptoms are gradually improving, the bleeding is becoming lighter and lighter and now it is spotting and a light pink discharge on her underwear. She denies frank abdominal pain nausea vomiting or diarrhea and has no difficulty urinating. She reports that she does not have the money to go to the OB/GYN that she came here for evaluation because she is still having some spotting.  Past Medical History  Diagnosis Date  . Ectopic pregnancy   . Asthma   . Trichomonas vaginitis   . BV (bacterial vaginosis) 03/06/2013  . Chronic dental pain   . Panic attacks   . Chronic pelvic pain in female   . Depression   . Polysubstance abuse     cocaine, opiates, marijuana   Past Surgical History  Procedure Laterality Date  . Ectopic pregnancy surgery     Family History  Problem Relation Age of Onset  . COPD Mother   . Stroke Mother   . Heart disease Maternal Grandmother   . Hypertension Maternal Grandmother   . Stroke Maternal Grandfather   . Asthma Sister   . Down syndrome Sister    Social History  Substance Use Topics  . Smoking status: Current Every Day Smoker -- 1.00 packs/day    Types: Cigarettes  . Smokeless tobacco: Never Used  . Alcohol Use: Yes     Comment: occ   OB History    Gravida Para Term Preterm AB TAB SAB Ectopic Multiple Living   0 4     Review of Systems  All other systems reviewed and are  negative.     Allergies  Review of patient's allergies indicates no known allergies.  Home Medications   Prior to Admission medications   Medication Sig Start Date End Date Taking? Authorizing Provider  HYDROcodone-acetaminophen (NORCO/VICODIN) 5-325 MG tablet Take 1 tablet by mouth every 4 (four) hours as needed for moderate pain (Must last 30 days.  Do not take and drive a car or use machinery.). 01/06/16  Yes Darreld Mclean, MD  cephALEXin (KEFLEX) 500 MG capsule Take 1 capsule (500 mg total) by mouth 4 (four) times daily. Patient not taking: Reported on 01/15/2016 12/30/15   Vanetta Mulders, MD  ibuprofen (ADVIL,MOTRIN) 800 MG tablet Take 1 tablet (800 mg total) by mouth 3 (three) times daily. 01/15/16   Eber Hong, MD  Prenatal Vit-Fe Fumarate-FA (PRENATAL VITAMINS) 28-0.8 MG TABS Take 1 tablet by mouth daily. Patient not taking: Reported on 01/15/2016 12/30/15   Vanetta Mulders, MD   BP 97/62 mmHg  Pulse 85  Temp(Src) 97.9 F (36.6 C) (Oral)  Resp 20  Ht  (1.549 m)  Wt 148 lb (67.132 kg)  BMI 27.98 kg/m2  SpO2 100%  LMP 12/22/2015 Physical Exam  Constitutional: She appears well-developed and well-nourished. No distress.  HENT:  Head: Normocephalic and atraumatic.  Mouth/Throat: Oropharynx  is clear and moist. No oropharyngeal exudate.  Eyes: Conjunctivae and EOM are normal. Pupils are equal, round, and reactive to light. Right eye exhibits no discharge. Left eye exhibits no discharge. No scleral icterus.  Neck: Normal range of motion. Neck supple. No JVD present. No thyromegaly present.  Cardiovascular: Normal rate, regular rhythm, normal heart sounds and intact distal pulses.  Exam reveals no gallop and no friction rub.   No murmur heard. Pulmonary/Chest: Effort normal and breath sounds normal. No respiratory distress. She has no wheezes. She has no rales.  Abdominal: Soft. Bowel sounds are normal. She exhibits no distension and no mass. There is no tenderness.   Genitourinary:  Chap[place]one present:  Vaginal bleeding is minimal - scant blood in vault - os closed - no CMT or adnexal ttp or masses and no FB's - no uterine ttp.  Musculoskeletal: Normal range of motion. She exhibits no edema or tenderness.  Lymphadenopathy:    She has no cervical adenopathy.  Neurological: She is alert. Coordination normal.  Skin: Skin is warm and dry. No rash noted. No erythema.  Psychiatric: She has a normal mood and affect. Her behavior is normal.  Nursing note and vitals reviewed.   ED Course  Procedures (including critical care time) Labs Review Labs Reviewed  WET PREP, GENITAL - Abnormal; Notable for the following:    Clue Cells Wet Prep HPF POC PRESENT (*)    WBC, Wet Prep HPF POC FEW (*)    All other components within normal limits  GC/CHLAMYDIA PROBE AMP () NOT AT [facility]    Imaging [facility] No results found. I have personally reviewed and evaluated these images and lab results as part of my medical decision-making.    MDM   Final diagnoses:  Miscarriage    Well appearing - completing miscarriage - VS without acute findings -  Clue cells - but no d/c and no itching and no ttp.  Doubt symptomatic BV infection. Pt informed of tx plan - stable for d/c on motrin F/u encouraged and return precautions given Pt expressed understanding.    Eber HongBrian Hanaa Payes, MD 01/15/16 [phone]

## 2016-01-15 NOTE — ED Notes (Signed)
Patient verbalized understanding of discharge instructions, home care and follow up care. Patient out of department at this time.

## 2016-01-15 NOTE — ED Notes (Signed)
Pt reports LMP was May 15th and reports was evaluated here last week for an insect bite and found out she was pregnant.  Pt started having vaginal bleeding last Monday.  Pt says is still bleeding and says she was told to return here if the bleeding didn't stop.  Pt says is still spotting.

## 2016-01-15 NOTE — Discharge Instructions (Signed)
Motrin for pain You will continue to have a small amount of bleeding but this should gradually be less and less If the bleeding worsens or becomes painful or if you have fevers and abdominal pain, return to the ER immediately.  Please obtain all of your results from medical records or have your doctors office obtain the results - share them with your doctor - you should be seen at your doctors office in the next 2 days. Call today to arrange your follow up. Take the medications as prescribed. Please review all of the medicines and only take them if you do not have an allergy to them. Please be aware that if you are taking birth control pills, taking other prescriptions, ESPECIALLY ANTIBIOTICS may make the birth control ineffective - if this is the case, either do not engage in sexual activity or use alternative methods of birth control such as condoms until you have finished the medicine and your family doctor says it is OK to restart them. If you are on a blood thinner such as COUMADIN, be aware that any other medicine that you take may cause the coumadin to either work too much, or not enough - you should have your coumadin level rechecked in next 7 days if this is the case.  ?  It is also a possibility that you have an allergic reaction to any of the medicines that you have been prescribed - Everybody reacts differently to medications and while MOST people have no trouble with most medicines, you may have a reaction such as nausea, vomiting, rash, swelling, shortness of breath. If this is the case, please stop taking the medicine immediately and contact your physician.  ?  You should return to the ER if you develop severe or worsening symptoms.

## 2016-01-16 LAB — GC/CHLAMYDIA PROBE AMP (~~LOC~~) NOT AT ARMC
Chlamydia: NEGATIVE
Neisseria Gonorrhea: NEGATIVE

## 2016-02-03 ENCOUNTER — Encounter: Payer: Self-pay | Admitting: Women's Health

## 2016-02-03 ENCOUNTER — Ambulatory Visit (INDEPENDENT_AMBULATORY_CARE_PROVIDER_SITE_OTHER): Payer: Medicaid Other | Admitting: Women's Health

## 2016-02-03 VITALS — BP 100/58 | HR 72 | Wt 134.0 lb

## 2016-02-03 DIAGNOSIS — O039 Complete or unspecified spontaneous abortion without complication: Secondary | ICD-10-CM

## 2016-02-03 DIAGNOSIS — A499 Bacterial infection, unspecified: Secondary | ICD-10-CM

## 2016-02-03 DIAGNOSIS — N898 Other specified noninflammatory disorders of vagina: Secondary | ICD-10-CM | POA: Diagnosis not present

## 2016-02-03 DIAGNOSIS — B9689 Other specified bacterial agents as the cause of diseases classified elsewhere: Secondary | ICD-10-CM

## 2016-02-03 DIAGNOSIS — F1190 Opioid use, unspecified, uncomplicated: Secondary | ICD-10-CM

## 2016-02-03 DIAGNOSIS — N760 Acute vaginitis: Secondary | ICD-10-CM | POA: Diagnosis not present

## 2016-02-03 DIAGNOSIS — F1490 Cocaine use, unspecified, uncomplicated: Secondary | ICD-10-CM

## 2016-02-03 DIAGNOSIS — F17200 Nicotine dependence, unspecified, uncomplicated: Secondary | ICD-10-CM | POA: Diagnosis not present

## 2016-02-03 DIAGNOSIS — O99320 Drug use complicating pregnancy, unspecified trimester: Secondary | ICD-10-CM

## 2016-02-03 DIAGNOSIS — F1410 Cocaine abuse, uncomplicated: Secondary | ICD-10-CM | POA: Insufficient documentation

## 2016-02-03 LAB — POCT WET PREP (WET MOUNT): Clue Cells Wet Prep Whiff POC: POSITIVE

## 2016-02-03 MED ORDER — METRONIDAZOLE 500 MG PO TABS: 500.0000 mg | ORAL_TABLET | Freq: Two times a day (BID) | ORAL | Status: DC

## 2016-02-03 MED ORDER — MEDROXYPROGESTERONE ACETATE 150 MG/ML IM SUSP: 150.0000 mg | INTRAMUSCULAR | Status: DC

## 2016-02-03 NOTE — Progress Notes (Signed)
   [facility] Visit  Patient name: Susan Filludrey W Bordwell MRN [id number]  Date of birth: 1976-06-07  CC & HPI:  Susan [name] is a 40 y.o. [id number] Caucasian female presenting today for report of recent miscarriage, still bleeding/cramping, and malodorous d/c.  Went to [facility] 5/22, dx w/ pregnancy- hcg 1186, u/s revealed no evidence of GS/fetus, ectopic pregnancy could not be excluded- recommended f/u hcg's and repeat u/s in 2wks. Went back to ED 6/4 hcg 286, went back again 6/8 for continued bleeding- hcg not done. Does use cocaine- last reported ~2wks ago. Smokes 2ppd cigarettes. Takes avg of 3 vicodin/day rx'd by Dr. Hilda LiasKeeling for tennis elbow. Denies any other illicit drugs. No etoh in 1mth. In classes currently for etoh/drug abuse. Doesn't have custody of any of her children. Not currently using any contraception, does want to get pregnant. Discussed this is probably not the best time for pregnancy for her as she needs to get off all drugs/etoh and get her life back together prior to trying for another pregnancy, otherwise they will take that child too. Pt agrees and wants to start contraception. Discussed progestin only options as she is >35yo and smokes 2ppd. Desires depo, can't come back today to get.  Patient's last menstrual period was 12/22/2015.  Last pap 03/2015 neg  Pertinent History Reviewed:  Medical & Surgical Hx:   Past medical, surgical, family, and social history reviewed in electronic medical record Medications: Reviewed & Updated - see associated section Allergies: Reviewed in electronic medical record  Objective Findings:  Vitals: BP 100/58 mmHg  Pulse 72  Wt 134 lb (60.782 kg)  LMP 12/22/2015 Body mass index is 25.33 kg/(m^2).  Physical Examination: General appearance - alert, well appearing, and in no distress Pelvic - cx visually closed, small amt light red malodorous d/c  Results for orders placed or performed in visit on 02/03/16 (from the past 24 hour(s))  POCT Wet  Prep Mellody Drown(Wet [facility])   Collection Time: 02/03/16  2:38 PM  Result Value Ref Range   Source Wet Prep POC vaginal    WBC, Wet Prep HPF POC few    Bacteria Wet Prep HPF POC None None, Few, Too numerous to count   BACTERIA WET PREP MORPHOLOGY POC     Clue Cells Wet Prep HPF POC Many (A) None, Too numerous to count   Clue Cells Wet Prep Whiff POC Positive Whiff    Yeast Wet Prep HPF POC None    KOH Wet Prep POC     Trichomonas Wet Prep HPF POC none      Assessment & Plan:  A:   BV  Recent SAB  Cocaine, etoh, opiate use  Heavy smoker  P:  HCG today  Rx metronidazole 500mg  BID x 7d for BV, no sex or etoh while taking   Rx depo w/ 3RF  Will call pt tomorrow w/ hcg results and schedule time to come back for depo  Advised cessation of cocaine/etoh/smoking, needs to decrease opiate use  Return for i will call pt, then 2mths for physical.  Marge DuncansBooker, Kimberl Vig Randall CNM, [facility]NP-BC 02/03/2016 2:39 PM

## 2016-02-03 NOTE — Patient Instructions (Signed)
No sex or alcohol until after you are finished with the medicine Call me back tomorrow [phone]) to get your test results and I'll tell you when you can come for depo  Bacterial Vaginosis Bacterial vaginosis is a vaginal infection that occurs when the normal balance of bacteria in the vagina is disrupted. It results from an overgrowth of certain bacteria. This is the most common vaginal infection in women of childbearing age. Treatment is important to prevent complications, especially in pregnant women, as it can cause a premature delivery. CAUSES  Bacterial vaginosis is caused by an increase in harmful bacteria that are normally present in smaller amounts in the vagina. Several different kinds of bacteria can cause bacterial vaginosis. However, the reason that the condition develops is not fully understood. RISK FACTORS Certain activities or behaviors can put you at an increased risk of developing bacterial vaginosis, including:  Having a new sex partner or multiple sex partners.  Douching.  Using an intrauterine device (IUD) for contraception. Women do not get bacterial vaginosis from toilet seats, bedding, swimming pools, or contact with objects around them. SIGNS AND SYMPTOMS  Some women with bacterial vaginosis have no signs or symptoms. Common symptoms include:  Grey vaginal discharge.  A fishlike odor with discharge, especially after sexual intercourse.  Itching or burning of the vagina and vulva.  Burning or pain with urination. DIAGNOSIS  Your health care provider will take a medical history and examine the vagina for signs of bacterial vaginosis. A sample of vaginal fluid may be taken. Your health care provider will look at this sample under a microscope to check for bacteria and abnormal cells. A vaginal pH test may also be done.  TREATMENT  Bacterial vaginosis may be treated with antibiotic medicines. These may be given in the form of a pill or a vaginal cream. A second  round of antibiotics may be prescribed if the condition comes back after treatment. Because bacterial vaginosis increases your risk for sexually transmitted diseases, getting treated can help reduce your risk for chlamydia, gonorrhea, HIV, and herpes. HOME CARE INSTRUCTIONS   Only take over-the-counter or prescription medicines as directed by your health care provider.  If antibiotic medicine was prescribed, take it as directed. Make sure you finish it even if you start to feel better.  Tell all sexual partners that you have a vaginal infection. They should see their health care provider and be treated if they have problems, such as a mild rash or itching.  During treatment, it is important that you follow these instructions:  Avoid sexual activity or use condoms correctly.  Do not douche.  Avoid alcohol as directed by your health care provider.  Avoid breastfeeding as directed by your health care provider. SEEK MEDICAL CARE IF:   Your symptoms are not improving after 3 days of treatment.  You have increased discharge or pain.  You have a fever. MAKE SURE YOU:   Understand these instructions.  Will watch your condition.  Will get help right away if you are not doing well or get worse. FOR MORE INFORMATION  Centers for Disease Control and Prevention, Division of STD Prevention: SolutionApps.co.zawww.cdc.gov/std American Sexual Health Association (ASHA): www.ashastd.org    This information is not intended to replace advice given to you by your health care provider. Make sure you discuss any questions you have with your health care provider.   Document Released: 07/26/2005 Document Revised: 08/16/2014 Document Reviewed: 03/07/2013 Elsevier Interactive Patient Education [employer]2016 Elsevier Inc.

## 2016-02-04 LAB — BETA HCG QUANT (REF LAB): hCG Quant: 11 m[IU]/mL

## 2016-02-05 ENCOUNTER — Telehealth: Payer: Self-pay | Admitting: *Deleted

## 2016-02-05 ENCOUNTER — Telehealth: Payer: Self-pay | Admitting: Orthopaedic Surgery

## 2016-02-05 MED ORDER — HYDROCODONE-ACETAMINOPHEN 5-325 MG PO TABS: 1.0000 | ORAL_TABLET | ORAL | Status: DC | PRN

## 2016-02-05 NOTE — Telephone Encounter (Signed)
Patient called for refill of medication: HYDROcodone-acetaminophen (NORCO/VICODIN) 5-325 MG tablet [[id number]][[id number]] - quantity 120.

## 2016-02-05 NOTE — Telephone Encounter (Signed)
Rx done. 

## 2016-02-09 NOTE — Telephone Encounter (Signed)
Unsuccessful after multiple attempts at contacting pt to discuss hcg results and schedule depo injection.  Cheral MarkerKimberly R. Morris Longenecker, CNM, [facility]NP[facility]BC 02/09/2016 1:38 PM

## 2016-02-18 ENCOUNTER — Telehealth: Payer: Self-pay | Admitting: *Deleted

## 2016-02-18 NOTE — Telephone Encounter (Signed)
Pt informed of QHCG of 11, Pt c/o bright red bleeding. Offered pt an appt for tomorrow, pt states she would have to call back to schedule the appt.

## 2016-03-11 ENCOUNTER — Telehealth: Payer: Self-pay | Admitting: Orthopaedic Surgery

## 2016-03-11 MED ORDER — HYDROCODONE-ACETAMINOPHEN 5-325 MG PO TABS: 1.0000 | ORAL_TABLET | ORAL | 0 refills | Status: DC | PRN

## 2016-03-11 NOTE — Telephone Encounter (Signed)
Hydrocodone-Acetaminophen  5/325mg  Qty 120 Tablets °

## 2016-04-07 ENCOUNTER — Encounter: Payer: Self-pay | Admitting: Orthopaedic Surgery

## 2016-04-07 ENCOUNTER — Ambulatory Visit (INDEPENDENT_AMBULATORY_CARE_PROVIDER_SITE_OTHER): Payer: Medicaid Other | Admitting: Orthopaedic Surgery

## 2016-04-07 VITALS — BP 104/64 | HR 69 | Temp 97.7°F | Ht 61.0 in | Wt 140.0 lb

## 2016-04-07 DIAGNOSIS — F17200 Nicotine dependence, unspecified, uncomplicated: Secondary | ICD-10-CM

## 2016-04-07 DIAGNOSIS — Z720 Tobacco use: Secondary | ICD-10-CM | POA: Diagnosis not present

## 2016-04-07 DIAGNOSIS — M25521 Pain in right elbow: Secondary | ICD-10-CM | POA: Diagnosis not present

## 2016-04-07 MED ORDER — HYDROCODONE-ACETAMINOPHEN 5-325 MG PO TABS: 1.0000 | ORAL_TABLET | ORAL | 0 refills | Status: DC | PRN

## 2016-04-07 NOTE — Progress Notes (Signed)
Patient JX:BJYNWG:Susan [name], female DOB:08-04-1976, 40 y.o. NFA:[id number]  Chief Complaint  Patient presents with  . Follow-up    Right Elbow    HPI  Susan [name] is a 40 y.o. female who has chronic right elbow pain lateral epicondyle.  She has been doing the ice massage, doing the exercises for the elbow with slight help. She has no new trauma.  She has no redness. She is taking her medicine. HPI  Body mass index is 26.45 kg/m.  ROS  Review of Systems  Constitutional:       Patient does not have Diabetes Mellitus. Patient does not have hypertension. Patient does not have COPD or shortness of breath. Patient does not have BMI > 35. Patient does not have current smoking history.  HENT: Negative for congestion.   Respiratory: Negative for cough and shortness of breath.   Cardiovascular: Negative for chest pain and leg swelling.  Endocrine: Negative for cold intolerance.  Musculoskeletal: Positive for arthralgias.  Allergic/Immunologic: Negative for environmental allergies.    Past Medical History:  Diagnosis Date  . Asthma   . BV (bacterial vaginosis) 03/06/2013  . Chronic dental pain   . Chronic pelvic pain in female   . Depression   . Ectopic pregnancy   . Panic attacks   . Polysubstance abuse    cocaine, opiates, marijuana  . Trichomonas vaginitis     Past Surgical History:  Procedure Laterality Date  . ECTOPIC PREGNANCY SURGERY      Family History  Problem Relation Age of Onset  . COPD Mother   . Stroke Mother   . Heart disease Maternal Grandmother   . Hypertension Maternal Grandmother   . Stroke Maternal Grandfather   . Asthma Sister   . Down syndrome Sister     Social History Social History  Substance Use Topics  . Smoking status: Current Every Day Smoker    Packs/day: 1.00    Types: Cigarettes  . Smokeless tobacco: Never Used  . Alcohol use No     Comment: occ    No Known Allergies  Current Outpatient Prescriptions  Medication Sig  Dispense Refill  . cephALEXin (KEFLEX) 500 MG capsule Take 1 capsule (500 mg total) by mouth 4 (four) times daily. 28 capsule 0  . ibuprofen (ADVIL,MOTRIN) 800 MG tablet Take 1 tablet (800 mg total) by mouth 3 (three) times daily. 21 tablet 0  . medroxyPROGESTERone (DEPO-PROVERA) 150 MG/ML injection Inject 1 mL (150 mg total) into the muscle every 3 (three) months. 1 mL 3  . metroNIDAZOLE (FLAGYL) 500 MG tablet Take 1 tablet (500 mg total) by mouth 2 (two) times daily. X 7 days. No sex or alcohol while taking 14 tablet 0  . Prenatal Vit-Fe Fumarate-FA (PRENATAL VITAMINS) 28-0.8 MG TABS Take 1 tablet by mouth daily. 30 tablet 2  . HYDROcodone-acetaminophen (NORCO/VICODIN) 5-325 MG tablet Take 1 tablet by mouth every 4 (four) hours as needed for moderate pain (Must last 14 days.Do not take and drive a car or use machinery.). 56 tablet 0   No current facility-administered medications for this visit.      Physical Exam  Blood pressure 104/64, pulse 69, temperature 97.7 F (36.5 C), height 5\' 1"  (1.549 m), weight 140 lb (63.5 kg), unknown if currently breastfeeding.  Constitutional: overall normal hygiene, normal nutrition, well developed, normal grooming, normal body habitus. Assistive device:none  Musculoskeletal: gait and station Limp none, muscle tone and strength are normal, no tremors or atrophy is present.  .Marland Kitchen  Neurological: coordination overall normal.  Deep tendon reflex/nerve stretch intact.  Sensation normal.  Cranial nerves II-XII intact.   Skin:   normal overall no scars, lesions, ulcers or rashes. No psoriasis.  Psychiatric: Alert and oriented x 3.  Recent memory intact, remote memory unclear.  Normal mood and affect. Well groomed.  Good eye contact.  Cardiovascular: overall no swelling, no varicosities, no edema bilaterally, normal temperatures of the legs and arms, no clubbing, cyanosis and good capillary refill.  Lymphatic: palpation is normal.  The right lateral  epicondyle is tender but has no swelling and no redness. ROM of the right elbow is full.  She has pain to resisted extension of the right wrist.  Left arm negative.  I have talked to her about her smoking.  She is trying to quit and has cut back on it.  I told her to continue.  The patient has been educated about the nature of the problem(s) and counseled on treatment options.  The patient appeared to understand what I have discussed and is in agreement with it.  Encounter Diagnoses  Name Primary?  . Right elbow pain Yes  . Tobacco smoker within last 12 months     PLAN Call if any problems.  Precautions discussed.  Continue current medications.   Return to clinic 3 months   Electronically Signed Darreld Mclean, MD 8/30/20172:16 PM

## 2016-04-07 NOTE — Patient Instructions (Signed)

## 2016-04-09 ENCOUNTER — Encounter (HOSPITAL_COMMUNITY): Payer: Self-pay

## 2016-04-09 ENCOUNTER — Emergency Department (HOSPITAL_COMMUNITY)
Admission: EM | Admit: 2016-04-09 | Discharge: 2016-04-09 | Discharge status: Against Medical Advice | Payer: Medicaid Other | Attending: Emergency Medicine | Admitting: Emergency Medicine

## 2016-04-09 DIAGNOSIS — S0181XA Laceration without foreign body of other part of head, initial encounter: Secondary | ICD-10-CM | POA: Insufficient documentation

## 2016-04-09 DIAGNOSIS — Y939 Activity, unspecified: Secondary | ICD-10-CM | POA: Insufficient documentation

## 2016-04-09 DIAGNOSIS — F17210 Nicotine dependence, cigarettes, uncomplicated: Secondary | ICD-10-CM | POA: Insufficient documentation

## 2016-04-09 DIAGNOSIS — S01511A Laceration without foreign body of lip, initial encounter: Secondary | ICD-10-CM | POA: Insufficient documentation

## 2016-04-09 DIAGNOSIS — J45909 Unspecified asthma, uncomplicated: Secondary | ICD-10-CM | POA: Insufficient documentation

## 2016-04-09 DIAGNOSIS — Y929 Unspecified place or not applicable: Secondary | ICD-10-CM | POA: Insufficient documentation

## 2016-04-09 DIAGNOSIS — Y999 Unspecified external cause status: Secondary | ICD-10-CM | POA: Insufficient documentation

## 2016-04-09 NOTE — ED Notes (Signed)
Pt mumbling and crying. Pt at times is unable to be understood. Pt states wanting to leave AMA. Pt refusing all treatment. Pt stating she is not going to have stitches. Pt in and out of room complaining that her ankle bracelet for house arrest needs to be charged. Pt will not stay in her room.
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
## 2016-04-10 ENCOUNTER — Emergency Department (HOSPITAL_COMMUNITY)
Admission: EM | Admit: 2016-04-10 | Discharge: 2016-04-10 | Disposition: A | Discharge status: Home / Self Care | Payer: Self-pay | Attending: Emergency Medicine | Admitting: Emergency Medicine

## 2016-04-10 ENCOUNTER — Emergency Department (HOSPITAL_COMMUNITY): Payer: Self-pay

## 2016-04-10 ENCOUNTER — Encounter (HOSPITAL_COMMUNITY): Payer: Self-pay

## 2016-04-10 DIAGNOSIS — IMO0002 Reserved for concepts with insufficient information to code with codable children: Secondary | ICD-10-CM

## 2016-04-10 DIAGNOSIS — F17210 Nicotine dependence, cigarettes, uncomplicated: Secondary | ICD-10-CM | POA: Insufficient documentation

## 2016-04-10 DIAGNOSIS — S01501A Unspecified open wound of lip, initial encounter: Secondary | ICD-10-CM | POA: Insufficient documentation

## 2016-04-10 DIAGNOSIS — Z23 Encounter for immunization: Secondary | ICD-10-CM | POA: Insufficient documentation

## 2016-04-10 DIAGNOSIS — Y929 Unspecified place or not applicable: Secondary | ICD-10-CM | POA: Insufficient documentation

## 2016-04-10 DIAGNOSIS — Y939 Activity, unspecified: Secondary | ICD-10-CM | POA: Insufficient documentation

## 2016-04-10 DIAGNOSIS — S01111A Laceration without foreign body of right eyelid and periocular area, initial encounter: Secondary | ICD-10-CM | POA: Insufficient documentation

## 2016-04-10 DIAGNOSIS — J45909 Unspecified asthma, uncomplicated: Secondary | ICD-10-CM | POA: Insufficient documentation

## 2016-04-10 DIAGNOSIS — S0990XA Unspecified injury of head, initial encounter: Secondary | ICD-10-CM | POA: Insufficient documentation

## 2016-04-10 DIAGNOSIS — Y999 Unspecified external cause status: Secondary | ICD-10-CM | POA: Insufficient documentation

## 2016-04-10 LAB — I-STAT CHEM 8, ED
BUN: 16 mg/dL (ref 6–20)
Calcium, Ion: 1.14 mmol/L — ABNORMAL LOW (ref 1.15–1.40)
Chloride: 107 mmol/L (ref 101–111)
Creatinine, Ser: 0.8 mg/dL (ref 0.44–1.00)
Glucose, Bld: 84 mg/dL (ref 65–99)
HCT: 46 % (ref 36.0–46.0)
Hemoglobin: 15.6 g/dL — ABNORMAL HIGH (ref 12.0–15.0)
Potassium: 3.5 mmol/L (ref 3.5–5.1)
Sodium: 147 mmol/L — ABNORMAL HIGH (ref 135–145)
TCO2: 24 mmol/L (ref 0–100)

## 2016-04-10 LAB — I-STAT BETA HCG BLOOD, ED (MC, WL, AP ONLY): I-stat hCG, quantitative: <5 m[IU]/mL (ref <5)

## 2016-04-10 MED ORDER — IBUPROFEN 400 MG PO TABS
400.0000 mg | ORAL_TABLET | Freq: Once | ORAL | Status: AC
  Administered 2016-04-10: 400 mg via ORAL
  Filled 2016-04-10: qty 1

## 2016-04-10 MED ORDER — TETANUS-DIPHTH-ACELL PERTUSSIS 5-2.5-18.5 LF-MCG/0.5 IM SUSP
0.5000 mL | Freq: Once | INTRAMUSCULAR | Status: AC
  Administered 2016-04-10: 0.5 mL via INTRAMUSCULAR
  Filled 2016-04-10: qty 0.5

## 2016-04-10 MED ORDER — LIDOCAINE-EPINEPHRINE-TETRACAINE (LET) SOLUTION
3.0000 mL | Freq: Once | NASAL | Status: AC
  Administered 2016-04-10: 3 mL via TOPICAL
  Filled 2016-04-10: qty 3

## 2016-04-10 NOTE — ED Triage Notes (Signed)
Patient states that she needs to be seen now.  Lacerations to right eyebrow, and right upper lip.
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
## 2016-04-20 ENCOUNTER — Emergency Department (HOSPITAL_COMMUNITY)
Admission: EM | Admit: 2016-04-20 | Discharge: 2016-04-20 | Disposition: A | Discharge status: Home / Self Care | Payer: Medicaid Other | Attending: Emergency Medicine | Admitting: Emergency Medicine

## 2016-04-20 ENCOUNTER — Encounter (HOSPITAL_COMMUNITY): Payer: Self-pay | Admitting: Emergency Medicine

## 2016-04-20 DIAGNOSIS — Z79899 Other long term (current) drug therapy: Secondary | ICD-10-CM | POA: Insufficient documentation

## 2016-04-20 DIAGNOSIS — IMO0002 Reserved for concepts with insufficient information to code with codable children: Secondary | ICD-10-CM

## 2016-04-20 DIAGNOSIS — F1010 Alcohol abuse, uncomplicated: Secondary | ICD-10-CM

## 2016-04-20 DIAGNOSIS — F10120 Alcohol abuse with intoxication, uncomplicated: Secondary | ICD-10-CM | POA: Insufficient documentation

## 2016-04-20 DIAGNOSIS — F17210 Nicotine dependence, cigarettes, uncomplicated: Secondary | ICD-10-CM | POA: Insufficient documentation

## 2016-04-20 DIAGNOSIS — J45909 Unspecified asthma, uncomplicated: Secondary | ICD-10-CM | POA: Insufficient documentation

## 2016-04-20 NOTE — ED Notes (Signed)
Pt combative at this time, [employer] at bedside.
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
## 2016-04-20 NOTE — ED Triage Notes (Signed)
Pt here for medical clearance due to ETOH prior to being taken to jail. Pt yelling, swearing and uncooperative/combative.

## 2016-04-20 NOTE — ED Notes (Signed)
Pt ambulatory with two police to go to jail. Pt kicking and screaming yelling at staff.

## 2016-04-20 NOTE — Discharge Instructions (Signed)
Patient is clinically intoxicated and  is refusing blood draws. I do not see any medical reason that she can not be incarcerated safely at this time.

## 2016-04-20 NOTE — ED Notes (Signed)
Pt began to yell at officer and at nurse screaming "fuck you bitch" and other foul language.  Pt combative, officer asked pt to sit back down and pt would not comply.  Dr. Clayborne DanaMesner at bedside, officer called jail to see if pt could go straight to jail with Doctor's consent.  Dr. Clayborne DanaMesner okay with plan to let pt leave with [employer] and go to jail.

## 2016-06-02 ENCOUNTER — Telehealth: Payer: Self-pay | Admitting: Orthopaedic Surgery

## 2016-06-02 MED ORDER — HYDROCODONE-ACETAMINOPHEN 5-325 MG PO TABS: 1.0000 | ORAL_TABLET | Freq: Four times a day (QID) | ORAL | 0 refills | Status: DC | PRN

## 2016-06-02 NOTE — Telephone Encounter (Signed)
Patient requests refill:  HYDROcodone-acetaminophen (NORCO/VICODIN) 5-325 MG tablet 56 tablet  - Insurance: Medicaid

## 2016-06-10 ENCOUNTER — Encounter (HOSPITAL_COMMUNITY): Payer: Self-pay | Admitting: Emergency Medicine

## 2016-06-10 ENCOUNTER — Emergency Department (HOSPITAL_COMMUNITY)
Admission: EM | Admit: 2016-06-10 | Discharge: 2016-06-10 | Disposition: A | Discharge status: Home / Self Care | Payer: Medicaid Other | Attending: Emergency Medicine | Admitting: Emergency Medicine

## 2016-06-10 DIAGNOSIS — Z791 Long term (current) use of non-steroidal anti-inflammatories (NSAID): Secondary | ICD-10-CM | POA: Insufficient documentation

## 2016-06-10 DIAGNOSIS — R519 Headache, unspecified: Secondary | ICD-10-CM

## 2016-06-10 DIAGNOSIS — R51 Headache: Secondary | ICD-10-CM | POA: Insufficient documentation

## 2016-06-10 DIAGNOSIS — B9689 Other specified bacterial agents as the cause of diseases classified elsewhere: Secondary | ICD-10-CM

## 2016-06-10 DIAGNOSIS — F17210 Nicotine dependence, cigarettes, uncomplicated: Secondary | ICD-10-CM | POA: Insufficient documentation

## 2016-06-10 DIAGNOSIS — N760 Acute vaginitis: Secondary | ICD-10-CM | POA: Insufficient documentation

## 2016-06-10 DIAGNOSIS — J45909 Unspecified asthma, uncomplicated: Secondary | ICD-10-CM | POA: Insufficient documentation

## 2016-06-10 DIAGNOSIS — Z79899 Other long term (current) drug therapy: Secondary | ICD-10-CM | POA: Insufficient documentation

## 2016-06-10 LAB — WET PREP, GENITAL
Sperm: NONE SEEN
Trich, Wet Prep: NONE SEEN
Yeast Wet Prep HPF POC: NONE SEEN

## 2016-06-10 LAB — PREGNANCY, URINE: Preg Test, Ur: NEGATIVE

## 2016-06-10 LAB — URINALYSIS, ROUTINE W REFLEX MICROSCOPIC
Bilirubin Urine: NEGATIVE
Glucose, UA: NEGATIVE mg/dL
Hgb urine dipstick: NEGATIVE
Ketones, ur: NEGATIVE mg/dL
Leukocytes, UA: NEGATIVE
Nitrite: NEGATIVE
Protein, ur: NEGATIVE mg/dL
Specific Gravity, Urine: 1.02 (ref 1.005–1.030)
pH: 6 (ref 5.0–8.0)

## 2016-06-10 MED ORDER — IBUPROFEN 800 MG PO TABS
800.0000 mg | ORAL_TABLET | Freq: Once | ORAL | Status: AC
  Administered 2016-06-10: 800 mg via ORAL
  Filled 2016-06-10: qty 1

## 2016-06-10 MED ORDER — METRONIDAZOLE 0.75 % VA GEL: 1.0000 | Freq: Two times a day (BID) | VAGINAL | 0 refills | Status: DC

## 2016-06-10 NOTE — ED Notes (Signed)
Pt c/o white vaginal discharge with an odor, pain with sex that started about two weeks ago, pt also c/o shooting pain to head from facial area that started a month ago,

## 2016-06-10 NOTE — ED Provider Notes (Signed)
[facility] DEPT Provider Note   CSN: [id number] Arrival date & time: 06/10/16  1827     History   Chief Complaint Chief Complaint  Patient presents with  . Vaginal Discharge    HPI Susan [name] is a 40 y.o. female.  The patient is here with complaint of dyspareunia and vaginal pain with discharge and odor for 2 weeks. No fever, nausea or vomiting. No irregular bleeding. No dysuria or hematuria. She also complains of persistent headache around her right eye for the past month since suffering a laceration after contusion injury to her right forehead. No syncope, visual changes or facial swelling.   The history is provided by the patient. No language interpreter was used.  Vaginal Discharge   Associated symptoms include dyspareunia. Pertinent negatives include no fever, no abdominal pain, no nausea, no vomiting and no dysuria.    Past Medical History:  Diagnosis Date  . Asthma   . BV (bacterial vaginosis) 03/06/2013  . Chronic dental pain   . Chronic pelvic pain in female   . Depression   . Ectopic pregnancy   . Panic attacks   . Polysubstance abuse    cocaine, opiates, marijuana  . Trichomonas vaginitis     Patient Active Problem List   Diagnosis Date Noted  . Heavy smoker 02/03/2016  . Cocaine use 02/03/2016  . Chronic, continuous use of opioids 03/24/2015  . Depression with anxiety 03/24/2015  . Poor dentition 03/24/2015  . CLOSED FRACTURE DISTAL PHALANX OR PHALANGES HAND 06/05/2009  . Sprain of ankle, unspecified site 05/28/2009    Past Surgical History:  Procedure Laterality Date  . ECTOPIC PREGNANCY SURGERY      OB History    Gravida Para Term Preterm AB Living   7 4 4   3 4    SAB TAB Ectopic Multiple Live Births     2 1 0 4       Home Medications    Prior to Admission medications   Medication Sig Start Date End Date Taking? Authorizing Provider  HYDROcodone-acetaminophen (NORCO/VICODIN) 5-325 MG tablet Take 1 tablet by mouth every 6 (six)  hours as needed for moderate pain (Must last 14 days.Do not take and drive a car or use machinery.). 06/02/16  Yes Darreld McleanWayne Keeling, MD  cephALEXin (KEFLEX) 500 MG capsule Take 1 capsule (500 mg total) by mouth 4 (four) times daily. 12/30/15   Vanetta MuldersScott Zackowski, MD  ibuprofen (ADVIL,MOTRIN) 800 MG tablet Take 1 tablet (800 mg total) by mouth 3 (three) times daily. 01/15/16   Eber HongBrian Miller, MD  medroxyPROGESTERone (DEPO-PROVERA) 150 MG/ML injection Inject 1 mL (150 mg total) into the muscle every 3 (three) months. 02/03/16   Cheral MarkerKimberly R Booker, CNM  metroNIDAZOLE (FLAGYL) 500 MG tablet Take 1 tablet (500 mg total) by mouth 2 (two) times daily. X 7 days. No sex or alcohol while taking 02/03/16   Cheral MarkerKimberly R Booker, CNM  Prenatal Vit-Fe Fumarate-FA (PRENATAL VITAMINS) 28-0.8 MG TABS Take 1 tablet by mouth daily. 12/30/15   Vanetta MuldersScott Zackowski, MD    Family History Family History  Problem Relation Age of Onset  . COPD Mother   . Stroke Mother   . Heart disease Maternal Grandmother   . Hypertension Maternal Grandmother   . Stroke Maternal Grandfather   . Asthma Sister   . Down syndrome Sister     Social History Social History  Substance Use Topics  . Smoking status: Current Every Day Smoker    Packs/day: 1.00    Types:  Cigarettes  . Smokeless tobacco: Never Used  . Alcohol use Yes     Comment: occ     Allergies   Review of patient's allergies indicates no known allergies.   Review of Systems Review of Systems  Constitutional: Negative for chills and fever.  Eyes: Negative for visual disturbance.  Respiratory: Negative.  Negative for shortness of breath.   Cardiovascular: Negative.  Negative for chest pain.  Gastrointestinal: Negative for abdominal pain, nausea and vomiting.  Genitourinary: Positive for dyspareunia, pelvic pain, vaginal discharge and vaginal pain. Negative for dysuria.  Musculoskeletal: Negative.  Negative for back pain.  Skin: Negative.   Neurological: Positive for  headaches.     Physical Exam Updated Vital Signs BP 130/64   Pulse 88   Temp 98.2 F (36.8 C)   Resp 18   Ht 5\' 4"  (1.626 m)   Wt 63.5 kg   LMP 05/10/2016   SpO2 98%   BMI 24.03 kg/m   Physical Exam  Constitutional: She is oriented to person, place, and time. She appears well-developed and well-nourished.  HENT:  Head: Normocephalic.  No facial swelling or redness.   Eyes: Conjunctivae and EOM are normal. Pupils are equal, round, and reactive to light. No scleral icterus.  Neck: Normal range of motion. Neck supple.  Cardiovascular: Normal rate and regular rhythm.   Pulmonary/Chest: Effort normal and breath sounds normal.  Abdominal: Soft. Bowel sounds are normal. There is no tenderness. There is no rebound and no guarding.  Genitourinary:  Genitourinary Comments: No cervical discharge present. There is generalized pelvic tenderness on bimanual exam without adnexal mass or specific CMT. External vagina unremarkable.   Musculoskeletal: Normal range of motion.  Neurological: She is alert and oriented to person, place, and time.  Skin: Skin is warm and dry. No rash noted.  Psychiatric: She has a normal mood and affect.     ED Treatments / Results  Labs (all labs ordered are listed, but only abnormal results are displayed) Labs Reviewed  WET PREP, GENITAL - Abnormal; Notable for the following:       Result Value   Clue Cells Wet Prep HPF POC PRESENT (*)    WBC, Wet Prep HPF POC RARE (*)    All other components within normal limits  PREGNANCY, URINE  URINALYSIS, ROUTINE W REFLEX MICROSCOPIC ([facility])  GC/CHLAMYDIA PROBE AMP ([place]) NOT AT [facility]    EKG  EKG Interpretation None       Radiology No results found.  Procedures Procedures (including critical care time)  Medications Ordered in ED Medications - No data to display   Initial Impression / Assessment and Plan / ED Course  I have reviewed the triage vital signs and the nursing  notes.  Pertinent labs & imaging results that were available during my care of the patient were reviewed by me and considered in my medical decision making (see chart for details).  Clinical Course    Patient with dyspareunia and vaginal pain with complaint of discharge. No discharge on exam. She does have clue cells so will treat for BV. Encourage GYN follow up.  Headache x 1 month after facial injury. No neurologic deficits. Patient reassured.   Final Clinical Impressions(s) / ED Diagnoses   Final diagnoses:  None  1. BV 2. Facial pain  New Prescriptions New Prescriptions   No medications on file     Danne HarborShari Kyriaki Moder, PA-C 06/10/16 2106    Jacalyn LefevreJulie Haviland, MD 06/10/16 2326

## 2016-06-10 NOTE — ED Triage Notes (Signed)
Pt reports continuing headaches from an injury to her head. Pt c/o vaginal discharge and odor. Pt unsure of pregnancy status.

## 2016-06-10 NOTE — ED Notes (Signed)
Pelvic exam completed by PA, pt tolerated well,

## 2016-06-14 LAB — GC/CHLAMYDIA PROBE AMP (~~LOC~~) NOT AT ARMC
Chlamydia: NEGATIVE
Neisseria Gonorrhea: NEGATIVE

## 2016-06-14 NOTE — Telephone Encounter (Signed)
error 

## 2016-06-17 ENCOUNTER — Telehealth: Payer: Self-pay | Admitting: Orthopaedic Surgery

## 2016-06-17 MED ORDER — HYDROCODONE-ACETAMINOPHEN 5-325 MG PO TABS: 1.0000 | ORAL_TABLET | Freq: Four times a day (QID) | ORAL | 0 refills | Status: DC | PRN

## 2016-06-17 NOTE — Telephone Encounter (Signed)
Patient called for refill  HYDROcodone-acetaminophen (NORCO/VICODIN) 5-325 MG tablet 40 tablet  Insurance (Medicaid)

## 2016-07-07 ENCOUNTER — Ambulatory Visit: Payer: Medicaid Other | Admitting: Orthopaedic Surgery

## 2016-07-13 ENCOUNTER — Ambulatory Visit (INDEPENDENT_AMBULATORY_CARE_PROVIDER_SITE_OTHER): Payer: Medicaid Other | Admitting: Orthopaedic Surgery

## 2016-07-13 VITALS — BP 107/65 | HR 89 | Temp 96.8°F | Ht 61.0 in | Wt 141.0 lb

## 2016-07-13 DIAGNOSIS — M25521 Pain in right elbow: Secondary | ICD-10-CM

## 2016-07-13 DIAGNOSIS — F17200 Nicotine dependence, unspecified, uncomplicated: Secondary | ICD-10-CM

## 2016-07-13 MED ORDER — HYDROCODONE-ACETAMINOPHEN 5-325 MG PO TABS: 1.0000 | ORAL_TABLET | Freq: Four times a day (QID) | ORAL | 0 refills | Status: DC | PRN

## 2016-07-13 NOTE — Patient Instructions (Addendum)
Steps to Quit Smoking Smoking tobacco can be bad for your health. It can also affect almost every organ in your body. Smoking puts you and people around you at risk for many serious Susan [name]-lasting (chronic) diseases. Quitting smoking is hard, but it is one of the best things that you can do for your health. It is never too late to quit. What are the benefits of quitting smoking? When you quit smoking, you lower your risk for getting serious diseases and conditions. They can include:  Lung cancer or lung disease.  Heart disease.  Stroke.  Heart attack.  Not being able to have children (infertility).  Weak bones (osteoporosis) and broken bones (fractures). If you have coughing, wheezing, and shortness of breath, those symptoms may get better when you quit. You may also get sick less often. If you are pregnant, quitting smoking can help to lower your chances of having a baby of low birth weight. What can I do to help me quit smoking? Talk with your doctor about what can help you quit smoking. Some things you can do (strategies) include:  Quitting smoking totally, instead of slowly cutting back how much you smoke over a period of time.  Going to in-person counseling. You are more likely to quit if you go to many counseling sessions.  Using resources and support systems, such as:  Online chats with a counselor.  Phone quitlines.  Printed self-help materials.  Support groups or group counseling.  Text messaging programs.  Mobile phone apps or applications.  Taking medicines. Some of these medicines may have nicotine in them. If you are pregnant or breastfeeding, do not take any medicines to quit smoking unless your doctor says it is okay. Talk with your doctor about counseling or other things that can help you. Talk with your doctor about using more than one strategy at the same time, such as taking medicines while you are also going to in-person counseling. This can help make quitting  easier. What things can I do to make it easier to quit? Quitting smoking might feel very hard at first, but there is a lot that you can do to make it easier. Take these steps:  Talk to your family and friends. Ask them to support and encourage you.  Call phone quitlines, reach out to support groups, or work with a counselor.  Ask people who smoke to not smoke around you.  Avoid places that make you want (trigger) to smoke, such as:  Bars.  Parties.  Smoke-break areas at work.  Spend time with people who do not smoke.  Lower the stress in your life. Stress can make you want to smoke. Try these things to help your stress:  Getting regular exercise.  Deep-breathing exercises.  Yoga.  Meditating.  Doing a body scan. To do this, close your eyes, focus on one area of your body at a time from head to toe, and notice which parts of your body are tense. Try to relax the muscles in those areas.  Download or buy apps on your mobile phone or tablet that can help you stick to your quit plan. There are many free apps, such as QuitGuide from the CDC (Centers for Disease Control and Prevention). You can find more support from smokefree.gov and other websites. This information is not intended to replace advice given to you by your health care provider. Make sure you discuss any questions you have with your health care provider. Document Released: 05/22/2009 Document Revised: 03/23/2016 Document   Reviewed: 12/10/2014 Elsevier Interactive Patient Education  2017 Elsevier Inc.  

## 2016-07-13 NOTE — Progress Notes (Signed)
Patient ZO:XWRUEA:Susan [name], female DOB:1975-09-26, 40 y.o. VWU:[id number]  Chief Complaint  Patient presents with  . Follow-up    Right elbow pain    HPI  Susan [name] is a 40 y.o. 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 It is stable. She has pain most of the time. She uses ice packs.  She has no new trauma.  HPI  Body mass index is 26.64 kg/m.  ROS  Review of Systems  Constitutional:       Patient does not have Diabetes Mellitus. Patient does not have hypertension. Patient does not have COPD or shortness of breath. Patient does not have BMI > 35. Patient does not have current smoking history.  HENT: Negative for congestion.   Respiratory: Negative for cough and shortness of breath.   Cardiovascular: Negative for chest pain and leg swelling.  Endocrine: Negative for cold intolerance.  Musculoskeletal: Positive for arthralgias.  Allergic/Immunologic: Negative for environmental allergies.    Past Medical History:  Diagnosis Date  . Asthma   . BV (bacterial vaginosis) 03/06/2013  . Chronic dental pain   . Chronic pelvic pain in female   . Depression   . Ectopic pregnancy   . Panic attacks   . Polysubstance abuse    cocaine, opiates, marijuana  . Trichomonas vaginitis     Past Surgical History:  Procedure Laterality Date  . ECTOPIC PREGNANCY SURGERY      Family History  Problem Relation Age of Onset  . COPD Mother   . Stroke Mother   . Heart disease Maternal Grandmother   . Hypertension Maternal Grandmother   . Stroke Maternal Grandfather   . Asthma Sister   . Down syndrome Sister     Social History Social History  Substance Use Topics  . Smoking status: Current Every Day Smoker    Packs/day: 1.00    Types: Cigarettes  . Smokeless tobacco: Never Used  . Alcohol use Yes     Comment: occ    No Known Allergies  Current Outpatient Prescriptions  Medication Sig Dispense Refill  . cephALEXin (KEFLEX) 500 MG capsule Take 1 capsule (500 mg  total) by mouth 4 (four) times daily. 28 capsule 0  . HYDROcodone-acetaminophen (NORCO/VICODIN) 5-325 MG tablet Take 1 tablet by mouth every 6 (six) hours as needed for moderate pain (Must last 14 days.Do not take and drive a car or use machinery.). 30 tablet 0  . ibuprofen (ADVIL,MOTRIN) 800 MG tablet Take 1 tablet (800 mg total) by mouth 3 (three) times daily. 21 tablet 0  . medroxyPROGESTERone (DEPO-PROVERA) 150 MG/ML injection Inject 1 mL (150 mg total) into the muscle every 3 (three) months. 1 mL 3  . metroNIDAZOLE (FLAGYL) 500 MG tablet Take 1 tablet (500 mg total) by mouth 2 (two) times daily. X 7 days. No sex or alcohol while taking 14 tablet 0  . metroNIDAZOLE (METROGEL VAGINAL) 0.75 % vaginal gel Place 1 Applicatorful vaginally 2 (two) times daily. 70 g 0  . Prenatal Vit-Fe Fumarate-FA (PRENATAL VITAMINS) 28-0.8 MG TABS Take 1 tablet by mouth daily. 30 tablet 2   No current facility-administered medications for this visit.      Physical Exam  Blood pressure 107/65, pulse 89, temperature (!) 96.8 F (36 C), height 5\' 1"  (1.549 m), weight 141 lb (64 kg), unknown if currently breastfeeding.  Constitutional: overall normal hygiene, normal nutrition, well developed, normal grooming, normal body habitus. Assistive device:none  Musculoskeletal: gait and station Limp none, muscle tone and strength  are normal, no tremors or atrophy is present.  .  Neurological: coordination overall normal.  Deep tendon reflex/nerve stretch intact.  Sensation normal.  Cranial nerves II-XII intact.   Skin:   Normal overall no scars, lesions, ulcers or rashes. No psoriasis.  Psychiatric: Alert and oriented x 3.  Recent memory intact, remote memory unclear.  Normal mood and affect. Well groomed.  Good eye contact.  Cardiovascular: overall no swelling, no varicosities, no edema bilaterally, normal temperatures of the legs and arms, no clubbing, cyanosis and good capillary refill.  Lymphatic: palpation is  normal.  She has pain over the lateral epicondyle right with pain to resisted extension.  She has no swelling or redness. NV intact.  The patient has been educated about the nature of the problem(s) and counseled on treatment options.  The patient appeared to understand what I have discussed and is in agreement with it.  Encounter Diagnoses  Name Primary?  . Right elbow pain Yes  . Tobacco smoker within last 12 months     PLAN Call if any problems.  Precautions discussed.  Continue current medications.   Return to clinic 3 months   Electronically Signed Darreld McleanWayne Zya Finkle, MD 12/5/20179:40 AM

## 2016-07-15 ENCOUNTER — Encounter (HOSPITAL_COMMUNITY): Payer: Self-pay | Admitting: Emergency Medicine

## 2016-07-15 ENCOUNTER — Emergency Department (HOSPITAL_COMMUNITY)
Admission: EM | Admit: 2016-07-15 | Discharge: 2016-07-15 | Disposition: A | Discharge status: Home / Self Care | Payer: Medicaid Other | Attending: Emergency Medicine | Admitting: Emergency Medicine

## 2016-07-15 DIAGNOSIS — F1490 Cocaine use, unspecified, uncomplicated: Secondary | ICD-10-CM | POA: Insufficient documentation

## 2016-07-15 DIAGNOSIS — F1290 Cannabis use, unspecified, uncomplicated: Secondary | ICD-10-CM | POA: Insufficient documentation

## 2016-07-15 DIAGNOSIS — N760 Acute vaginitis: Secondary | ICD-10-CM | POA: Insufficient documentation

## 2016-07-15 DIAGNOSIS — B9689 Other specified bacterial agents as the cause of diseases classified elsewhere: Secondary | ICD-10-CM

## 2016-07-15 DIAGNOSIS — Z79899 Other long term (current) drug therapy: Secondary | ICD-10-CM | POA: Insufficient documentation

## 2016-07-15 DIAGNOSIS — J45909 Unspecified asthma, uncomplicated: Secondary | ICD-10-CM | POA: Insufficient documentation

## 2016-07-15 DIAGNOSIS — F17210 Nicotine dependence, cigarettes, uncomplicated: Secondary | ICD-10-CM | POA: Insufficient documentation

## 2016-07-15 LAB — POC URINE PREG, ED: Preg Test, Ur: NEGATIVE

## 2016-07-15 MED ORDER — METRONIDAZOLE 500 MG PO TABS: 500.0000 mg | ORAL_TABLET | Freq: Two times a day (BID) | ORAL | 0 refills | Status: DC

## 2016-07-15 MED ORDER — METRONIDAZOLE 500 MG PO TABS: 500.0000 mg | ORAL_TABLET | Freq: Once | ORAL | Status: DC

## 2016-07-15 NOTE — ED Triage Notes (Signed)
Pt states she was seen here 1 month ago for vaginal discharge. Pt was given prescription but states she had no money to get it filled. Pt c/o continuing discharge and discomfort. Pt states she has not had a period in over a month.

## 2016-07-15 NOTE — ED Notes (Signed)
Requested dose of Flagyl prior to leaving. Took the dischrage instructions and walked out prior to receiving med.

## 2016-07-17 NOTE — ED Provider Notes (Signed)
[facility] Provider Note   CSN: [id number] Arrival date & time: 07/15/16  1214     History   Chief Complaint Chief Complaint  Patient presents with  . Vaginal Discharge    HPI Susan [name] is a 40 y.o. female presenting for assistance with a new prescription.  She was seen here one month ago and diagnosed with bacterial vaginosis.  She was prescribed metrogel which she was unable to afford and requests a cheaper alternative.  She denies any new symptoms including no pelvic pain, fevers or chills but continues to have vaginal discharge and irritation.    The history is provided by the patient.    Past Medical History:  Diagnosis Date  . Asthma   . BV (bacterial vaginosis) 03/06/2013  . Chronic dental pain   . Chronic pelvic pain in female   . Depression   . Ectopic pregnancy   . Panic attacks   . Polysubstance abuse    cocaine, opiates, marijuana  . Trichomonas vaginitis     Patient Active Problem List   Diagnosis Date Noted  . Heavy smoker 02/03/2016  . Cocaine use 02/03/2016  . Chronic, continuous use of opioids 03/24/2015  . Depression with anxiety 03/24/2015  . Poor dentition 03/24/2015  . CLOSED FRACTURE DISTAL PHALANX OR PHALANGES HAND 06/05/2009  . Sprain of ankle, unspecified site 05/28/2009    Past Surgical History:  Procedure Laterality Date  . ECTOPIC PREGNANCY SURGERY      OB History    Gravida Para Term Preterm AB Living   7 4 4   3 4    SAB TAB Ectopic Multiple Live Births     2 1 0 4       Home Medications    Prior to Admission medications   Medication Sig Start Date End Date Taking? Authorizing Provider  cephALEXin (KEFLEX) 500 MG capsule Take 1 capsule (500 mg total) by mouth 4 (four) times daily. 12/30/15   Vanetta MuldersScott Zackowski, MD  HYDROcodone-acetaminophen (NORCO/VICODIN) 5-325 MG tablet Take 1 tablet by mouth every 6 (six) hours as needed for moderate pain (Must last 14 days.Do not take and drive a car or use machinery.).  07/13/16   Darreld McleanWayne Keeling, MD  ibuprofen (ADVIL,MOTRIN) 800 MG tablet Take 1 tablet (800 mg total) by mouth 3 (three) times daily. 01/15/16   Eber HongBrian Miller, MD  medroxyPROGESTERone (DEPO-PROVERA) 150 MG/ML injection Inject 1 mL (150 mg total) into the muscle every 3 (three) months. 02/03/16   Cheral MarkerKimberly R Booker, CNM  metroNIDAZOLE (FLAGYL) 500 MG tablet Take 1 tablet (500 mg total) by mouth 2 (two) times daily. 07/15/16   Burgess AmorJulie Dolton Shaker, PA-C  metroNIDAZOLE (METROGEL VAGINAL) 0.75 % vaginal gel Place 1 Applicatorful vaginally 2 (two) times daily. 06/10/16   Elpidio AnisShari Upstill, PA-C  Prenatal Vit-Fe Fumarate-FA (PRENATAL VITAMINS) 28-0.8 MG TABS Take 1 tablet by mouth daily. 12/30/15   Vanetta MuldersScott Zackowski, MD    Family History Family History  Problem Relation Age of Onset  . COPD Mother   . Stroke Mother   . Heart disease Maternal Grandmother   . Hypertension Maternal Grandmother   . Stroke Maternal Grandfather   . Asthma Sister   . Down syndrome Sister     Social History Social History  Substance Use Topics  . Smoking status: Current Every Day Smoker    Packs/day: 1.00    Types: Cigarettes  . Smokeless tobacco: Never Used  . Alcohol use Yes     Comment: occ  Allergies   Patient has no known allergies.   Review of Systems Review of Systems  Constitutional: Negative for fever.  HENT: Negative for congestion and sore throat.   Eyes: Negative.   Respiratory: Negative for chest tightness and shortness of breath.   Cardiovascular: Negative for chest pain.  Gastrointestinal: Negative for abdominal pain, nausea and vomiting.  Genitourinary: Positive for vaginal discharge. Negative for dysuria, flank pain, pelvic pain and vaginal bleeding.  Musculoskeletal: Negative for arthralgias, joint swelling and neck pain.  Skin: Negative.  Negative for rash and wound.  Neurological: Negative for dizziness, weakness, light-headedness, numbness and headaches.  Psychiatric/Behavioral: Negative.       Physical Exam Updated Vital Signs BP 119/81 (BP Location: Left Arm)   Pulse 65   Temp 98.4 F (36.9 C) (Oral)   Resp 18   Ht 5\' 1"  (1.549 m)   Wt 64.4 kg   SpO2 99%   BMI 26.83 kg/m   Physical Exam  Constitutional: She appears well-developed and well-nourished.  HENT:  Head: Normocephalic and atraumatic.  Eyes: Conjunctivae are normal.  Neck: Normal range of motion.  Cardiovascular: Normal rate, regular rhythm, normal heart sounds and intact distal pulses.   Pulmonary/Chest: Effort normal and breath sounds normal. She has no wheezes.  Abdominal: Soft. Bowel sounds are normal. There is no tenderness. There is no guarding.  Musculoskeletal: Normal range of motion.  Neurological: She is alert.  Skin: Skin is warm and dry.  Psychiatric: She has a normal mood and affect.  Nursing note and vitals reviewed.    ED Treatments / Results  Labs (all labs ordered are listed, but only abnormal results are displayed) Labs Reviewed  POC URINE PREG, ED    EKG  EKG Interpretation None       Radiology No results found.  Procedures Procedures (including critical care time)  Medications Ordered in ED Medications - No data to display   Initial Impression / Assessment and Plan / ED Course  I have reviewed the triage vital signs and the nursing notes.  Pertinent labs & imaging results that were available during my care of the patient were reviewed by me and considered in my medical decision making (see chart for details).  Clinical Course     Labs from prior visit reviewed including gc/chlamydia cx. Negative.  Pt was prescribed flagyl oral tabs. Prn f/u anticipated. Pelvic exam not repeated today given negative cx and no new sx today.  Final Clinical Impressions(s) / ED Diagnoses   Final diagnoses:  Bacterial vaginosis    New Prescriptions Discharge Medication List as of 07/15/2016  1:33 PM       Burgess AmorJulie Kaysey Berndt, PA-C 07/17/16 1701    Raeford RazorStephen Kohut,  MD 07/18/16 [phone]

## 2016-07-26 ENCOUNTER — Telehealth: Payer: Self-pay | Admitting: Orthopaedic Surgery

## 2016-07-26 NOTE — Telephone Encounter (Signed)
Patient requests refill:  °HYDROcodone-acetaminophen (NORCO/VICODIN) 5-325 MG tablet 30 tablet  ° ° °

## 2016-07-27 MED ORDER — HYDROCODONE-ACETAMINOPHEN 5-325 MG PO TABS: 1.0000 | ORAL_TABLET | Freq: Four times a day (QID) | ORAL | 0 refills | Status: DC | PRN

## 2016-08-05 ENCOUNTER — Telehealth: Payer: Self-pay | Admitting: Orthopaedic Surgery

## 2016-08-05 NOTE — Telephone Encounter (Signed)
Patient requests refill, pain medication:  HYDROcodone-acetaminophen (NORCO/VICODIN) 5-325 MG tablet 25 tablet

## 2016-08-09 NOTE — L&D Delivery Note (Signed)
Patient is a 41 y.o. now [id number] who admitted for PTL, now s/p NSVD at 925w5d. Pregnancy also complicated by cocaine abuse, alcohol use, [facility], and h/o depression.  Delivery Note At 2:12 AM a viable female was delivered via Vaginal, Spontaneous Delivery (Presentation: [place] breech ).   APGAR: 4, 8; weight 1 lb 13.3 oz (830 g).   Placenta status: intact, sent to pathology.  Cord:  3-vessel.  Cord pH: 7.3  Anesthesia:  Natural Episiotomy: None Lacerations: None Suture Repair: none Est. Blood Loss (mL): 50  Mom to postpartum.  Baby to NICU.  Raynelle FanningJulie P. Degele, MD OB Fellow 04/10/17, 3:17 AM

## 2016-08-10 NOTE — Telephone Encounter (Signed)
Declined.  No more narcotics.

## 2016-08-12 NOTE — Telephone Encounter (Signed)
Patient made aware 08/11/16, and voiced  Understanding.

## 2016-09-06 ENCOUNTER — Emergency Department (HOSPITAL_COMMUNITY): Payer: Self-pay

## 2016-09-06 ENCOUNTER — Encounter (HOSPITAL_COMMUNITY): Payer: Self-pay | Admitting: Emergency Medicine

## 2016-09-06 ENCOUNTER — Emergency Department (HOSPITAL_COMMUNITY)
Admission: EM | Admit: 2016-09-06 | Discharge: 2016-09-06 | Disposition: A | Discharge status: Home / Self Care | Payer: Self-pay | Attending: Emergency Medicine | Admitting: Emergency Medicine

## 2016-09-06 DIAGNOSIS — F17210 Nicotine dependence, cigarettes, uncomplicated: Secondary | ICD-10-CM | POA: Insufficient documentation

## 2016-09-06 DIAGNOSIS — J069 Acute upper respiratory infection, unspecified: Secondary | ICD-10-CM | POA: Insufficient documentation

## 2016-09-06 DIAGNOSIS — B9789 Other viral agents as the cause of diseases classified elsewhere: Secondary | ICD-10-CM

## 2016-09-06 DIAGNOSIS — J45909 Unspecified asthma, uncomplicated: Secondary | ICD-10-CM | POA: Insufficient documentation

## 2016-09-06 DIAGNOSIS — Z79899 Other long term (current) drug therapy: Secondary | ICD-10-CM | POA: Insufficient documentation

## 2016-09-06 LAB — POC URINE PREG, ED: Preg Test, Ur: NEGATIVE

## 2016-09-06 MED ORDER — OXYMETAZOLINE HCL 0.05 % NA SOLN
1.0000 | Freq: Once | NASAL | Status: AC
  Administered 2016-09-06: 1 via NASAL
  Filled 2016-09-06: qty 15

## 2016-09-06 MED ORDER — NAPROXEN 500 MG PO TABS: 500.0000 mg | ORAL_TABLET | Freq: Two times a day (BID) | ORAL | 0 refills | Status: DC

## 2016-09-06 MED ORDER — GUAIFENESIN-CODEINE 100-10 MG/5ML PO SOLN
10.0000 mL | Freq: Once | ORAL | Status: AC
  Administered 2016-09-06: 10 mL via ORAL
  Filled 2016-09-06: qty 10

## 2016-09-06 MED ORDER — KETOROLAC TROMETHAMINE 10 MG PO TABS
10.0000 mg | ORAL_TABLET | Freq: Once | ORAL | Status: AC
  Administered 2016-09-06: 10 mg via ORAL
  Filled 2016-09-06: qty 1

## 2016-09-06 MED ORDER — GUAIFENESIN-CODEINE 100-10 MG/5ML PO SYRP
10.0000 mL | ORAL_SOLUTION | Freq: Three times a day (TID) | ORAL | 0 refills | Status: DC | PRN
Start: 2016-09-06 — End: 2017-01-04

## 2016-09-06 NOTE — Discharge Instructions (Signed)
1-2 sprays to each nostril two times a day for 3 days then stop.  Drink plenty of fluids.  Follow-up with the clinic listed or return to here for any worsening symptoms

## 2016-09-06 NOTE — ED Triage Notes (Signed)
PT c/o productive green sputum cough, sinus pressure/pain, sore throat x2 weeks.

## 2016-09-06 NOTE — ED Provider Notes (Signed)
[facility]-EMERGENCY DEPT Provider Note   CSN: [id number] Arrival date & time: 09/06/16  1356   By signing my name below, I, Cynda AcresHailei Fulton, attest that this documentation has been prepared under the direction and in the presence of Bayyinah Dukeman PA-C. Electronically Signed: Cynda AcresHailei Fulton, Scribe. 09/06/16. 3:30 PM.  History   Chief Complaint Chief Complaint  Patient presents with  . Cough    HPI Comments: Susan [name] is a 41 y.o. female with a hx of polysubstance abuse, who presents to the Emergency Department complaining of an intermittent productive cough with green sputum that began 2 weeks ago.  Patient has an associated sore throat, cold sweats, ear itching, sinus pain and pressure, and chest pain associated with excessive coughing. Patient reports taking robitussin and Nyquil with no improvement. She denies any rhinorrhea, fever, chills, shortness of breath, abdominal pain or other symptoms.    The history is provided by the patient. No language interpreter was used.    Past Medical History:  Diagnosis Date  . Asthma   . BV (bacterial vaginosis) 03/06/2013  . Chronic dental pain   . Chronic pelvic pain in female   . Depression   . Ectopic pregnancy   . Panic attacks   . Polysubstance abuse    cocaine, opiates, marijuana  . Trichomonas vaginitis     Patient Active Problem List   Diagnosis Date Noted  . Heavy smoker 02/03/2016  . Cocaine use 02/03/2016  . Chronic, continuous use of opioids 03/24/2015  . Depression with anxiety 03/24/2015  . Poor dentition 03/24/2015  . CLOSED FRACTURE DISTAL PHALANX OR PHALANGES HAND 06/05/2009  . Sprain of ankle, unspecified site 05/28/2009    Past Surgical History:  Procedure Laterality Date  . ECTOPIC PREGNANCY SURGERY      OB History    Gravida Para Term Preterm AB Living   7 4 4   3 4    SAB TAB Ectopic Multiple Live Births     2 1 0 4       Home Medications    Prior to Admission medications   Medication Sig Start  Date End Date Taking? Authorizing Provider  cephALEXin (KEFLEX) 500 MG capsule Take 1 capsule (500 mg total) by mouth 4 (four) times daily. 12/30/15   Vanetta MuldersScott Zackowski, MD  HYDROcodone-acetaminophen (NORCO/VICODIN) 5-325 MG tablet Take 1 tablet by mouth every 6 (six) hours as needed for moderate pain (Must last 14 days.Do not take and drive a car or use machinery.). 07/27/16   Darreld McleanWayne Keeling, MD  ibuprofen (ADVIL,MOTRIN) 800 MG tablet Take 1 tablet (800 mg total) by mouth 3 (three) times daily. 01/15/16   Eber HongBrian Miller, MD  medroxyPROGESTERone (DEPO-PROVERA) 150 MG/ML injection Inject 1 mL (150 mg total) into the muscle every 3 (three) months. 02/03/16   Cheral MarkerKimberly R Booker, CNM  metroNIDAZOLE (FLAGYL) 500 MG tablet Take 1 tablet (500 mg total) by mouth 2 (two) times daily. 07/15/16   Burgess AmorJulie Idol, PA-C  metroNIDAZOLE (METROGEL VAGINAL) 0.75 % vaginal gel Place 1 Applicatorful vaginally 2 (two) times daily. 06/10/16   Elpidio AnisShari Upstill, PA-C  Prenatal Vit-Fe Fumarate-FA (PRENATAL VITAMINS) 28-0.8 MG TABS Take 1 tablet by mouth daily. 12/30/15   Vanetta MuldersScott Zackowski, MD    Family History Family History  Problem Relation Age of Onset  . COPD Mother   . Stroke Mother   . Heart disease Maternal Grandmother   . Hypertension Maternal Grandmother   . Stroke Maternal Grandfather   . Asthma Sister   . Down syndrome  Sister     Social History Social History  Substance Use Topics  . Smoking status: Current Every Day Smoker    Packs/day: 1.00    Types: Cigarettes  . Smokeless tobacco: Never Used  . Alcohol use Yes     Comment: occ     Allergies   Patient has no known allergies.   Review of Systems Review of Systems  Constitutional: Positive for chills and diaphoresis. Negative for fever.  HENT: Positive for congestion, sinus pain, sinus pressure and sore throat. Negative for trouble swallowing.   Respiratory: Positive for cough. Negative for shortness of breath.   Cardiovascular: Positive for chest pain.    Gastrointestinal: Negative for abdominal pain, nausea and vomiting.  Genitourinary: Negative for dysuria and flank pain.  Musculoskeletal: Positive for myalgias. Negative for arthralgias.  Skin: Negative for rash.  Neurological: Negative for dizziness, weakness, numbness and headaches.     Physical Exam Updated Vital Signs BP 124/76 (BP Location: Left Arm)   Pulse 78   Temp 97 F (36.1 C) (Oral)   Resp 18   Ht 5\' 1"  (1.549 m)   Wt 147 lb (66.7 kg)   LMP 07/23/2016   SpO2 100%   BMI 27.78 kg/m   Physical Exam  Constitutional: She is oriented to person, place, and time. She appears well-developed and well-nourished. No distress.  HENT:  Head: Normocephalic and atraumatic.  Right Ear: Tympanic membrane and ear canal normal.  Left Ear: Tympanic membrane and ear canal normal.  Nose: Mucosal edema present. No rhinorrhea. Left sinus exhibits maxillary sinus tenderness.  Mouth/Throat: Uvula is midline, oropharynx is clear and moist and mucous membranes are normal. No trismus in the jaw.  Mild tenderness of the left maxillary  sinus. No edema. EOM's nml.   Eyes: Conjunctivae and EOM are normal. Pupils are equal, round, and reactive to light.  Neck: Normal range of motion. Neck supple.  Cardiovascular: Normal rate, regular rhythm and intact distal pulses.   No murmur heard. Pulmonary/Chest: Effort normal and breath sounds normal. No respiratory distress. She has no wheezes. She has no rales.  Abdominal: Soft. She exhibits no distension. There is no tenderness. There is no guarding.  Musculoskeletal: Normal range of motion.  Lymphadenopathy:    She has no cervical adenopathy.  Neurological: She is alert and oriented to person, place, and time.  Skin: Skin is warm and dry.  Nursing note and vitals reviewed.    ED Treatments / Results  DIAGNOSTIC STUDIES: Oxygen Saturation is 100% on RA, normal by my interpretation.    COORDINATION OF CARE: 3:28 PM Discussed treatment plan  with pt at bedside and pt agreed to plan, which includes a chest x-ray.   'Labs (all labs ordered are listed, but only abnormal results are displayed) Labs Reviewed  POC URINE PREG, ED    EKG  EKG Interpretation None       Radiology Dg Chest 2 View  Result Date: 09/06/2016 CLINICAL DATA:  Productive cough, sinus pressure and pain, sore throat for 2 weeks. History of asthma. Current smoker. EXAM: CHEST  2 VIEW COMPARISON:  None in PACs FINDINGS: The lungs are well-expanded and clear. The heart and pulmonary vascularity are normal. The mediastinum is normal in width. There is no pleural effusion. The bony thorax exhibits no acute abnormality. IMPRESSION: There is no acute cardiopulmonary disease. Electronically Signed   By: David  [place] M.D.   On: 09/06/2016 15:27     Procedures Procedures (including critical care time)  Medications  Ordered in ED Medications - No data to display   Initial Impression / Assessment and Plan / ED Course  I have reviewed the triage vital signs and the nursing notes.  Pertinent labs & imaging results that were available during my care of the patient were reviewed by me and considered in my medical decision making (see chart for details).  Well-appearing, vitals stable. Chest x-ray negative for pneumonia. PERC negative.   Pt appears stable for d/c, agrees to tx plan.  Tylenol if needed, rx for anti-tussive and NSAID. Return precautions given  Final Clinical Impressions(s) / ED Diagnoses   Final diagnoses:  Viral URI with cough    New Prescriptions Discharge Medication List as of 09/06/2016  3:55 PM    START taking these medications   Details  guaiFENesin-codeine (ROBITUSSIN AC) 100-10 MG/5ML syrup Take 10 mLs by mouth 3 (three) times daily as needed., Starting Mon 09/06/2016, Print    naproxen (NAPROSYN) 500 MG tablet Take 1 tablet (500 mg total) by mouth 2 (two) times daily with a meal., Starting Mon 09/06/2016, Print       I personally  performed the services described in this documentation, which was scribed in my presence. The recorded information has been reviewed and is accurate.    Pauline Aus, PA-C 09/08/16 2131    Jacalyn Lefevre, MD 09/10/16 [phone]

## 2016-09-25 ENCOUNTER — Emergency Department (HOSPITAL_COMMUNITY)
Admission: EM | Admit: 2016-09-25 | Discharge: 2016-09-25 | Disposition: A | Discharge status: Home / Self Care | Payer: Self-pay | Attending: Emergency Medicine | Admitting: Emergency Medicine

## 2016-09-25 ENCOUNTER — Encounter (HOSPITAL_COMMUNITY): Payer: Self-pay | Admitting: Emergency Medicine

## 2016-09-25 DIAGNOSIS — F1410 Cocaine abuse, uncomplicated: Secondary | ICD-10-CM | POA: Insufficient documentation

## 2016-09-25 DIAGNOSIS — F1290 Cannabis use, unspecified, uncomplicated: Secondary | ICD-10-CM | POA: Insufficient documentation

## 2016-09-25 DIAGNOSIS — J45909 Unspecified asthma, uncomplicated: Secondary | ICD-10-CM | POA: Insufficient documentation

## 2016-09-25 DIAGNOSIS — R4689 Other symptoms and signs involving appearance and behavior: Secondary | ICD-10-CM

## 2016-09-25 DIAGNOSIS — Z79899 Other long term (current) drug therapy: Secondary | ICD-10-CM | POA: Insufficient documentation

## 2016-09-25 DIAGNOSIS — F1010 Alcohol abuse, uncomplicated: Secondary | ICD-10-CM

## 2016-09-25 DIAGNOSIS — F17210 Nicotine dependence, cigarettes, uncomplicated: Secondary | ICD-10-CM | POA: Insufficient documentation

## 2016-09-25 DIAGNOSIS — F10920 Alcohol use, unspecified with intoxication, uncomplicated: Secondary | ICD-10-CM

## 2016-09-25 DIAGNOSIS — F10120 Alcohol abuse with intoxication, uncomplicated: Secondary | ICD-10-CM | POA: Insufficient documentation

## 2016-09-25 HISTORY — DX: Alcohol abuse, uncomplicated: F10.10

## 2016-09-25 LAB — CBC
HCT: 44.1 % (ref 36.0–46.0)
Hemoglobin: 15 g/dL (ref 12.0–15.0)
MCH: 33 pg (ref 26.0–34.0)
MCHC: 34 g/dL (ref 30.0–36.0)
MCV: 96.9 fL (ref 78.0–100.0)
Platelets: 310 10*3/uL (ref 150–400)
RBC: 4.55 MIL/uL (ref 3.87–5.11)
RDW: 12.9 % (ref 11.5–15.5)
WBC: 10.7 10*3/uL — ABNORMAL HIGH (ref 4.0–10.5)

## 2016-09-25 LAB — COMPREHENSIVE METABOLIC PANEL
ALT: 16 U/L (ref 14–54)
AST: 17 U/L (ref 15–41)
Albumin: 4.3 g/dL (ref 3.5–5.0)
Alkaline Phosphatase: 52 U/L (ref 38–126)
Anion gap: 12 (ref 5–15)
BUN: 10 mg/dL (ref 6–20)
CO2: 24 mmol/L (ref 22–32)
Calcium: 8.6 mg/dL — ABNORMAL LOW (ref 8.9–10.3)
Chloride: 104 mmol/L (ref 101–111)
Creatinine, Ser: 0.46 mg/dL (ref 0.44–1.00)
GFR calc Af Amer: >60 mL/min (ref >60)
GFR calc non Af Amer: >60 mL/min (ref >60)
Glucose, Bld: 103 mg/dL — ABNORMAL HIGH (ref 65–99)
Potassium: 3.4 mmol/L — ABNORMAL LOW (ref 3.5–5.1)
Sodium: 140 mmol/L (ref 135–145)
Total Bilirubin: 0.3 mg/dL (ref 0.3–1.2)
Total Protein: 7.1 g/dL (ref 6.5–8.1)

## 2016-09-25 LAB — RAPID URINE DRUG SCREEN, HOSP PERFORMED
Amphetamines: NOT DETECTED
Barbiturates: NOT DETECTED
Benzodiazepines: NOT DETECTED
Cocaine: POSITIVE — AB
Opiates: NOT DETECTED
Tetrahydrocannabinol: NOT DETECTED

## 2016-09-25 LAB — I-STAT BETA HCG BLOOD, ED (MC, WL, AP ONLY): I-stat hCG, quantitative: <5 m[IU]/mL (ref <5)

## 2016-09-25 LAB — ETHANOL: Alcohol, Ethyl (B): 385 mg/dL (ref <5)

## 2016-09-25 MED ORDER — SODIUM CHLORIDE 0.9 % IV SOLN
Freq: Once | INTRAVENOUS | Status: AC
  Administered 2016-09-25: 19:00:00 via INTRAVENOUS

## 2016-09-25 MED ORDER — SODIUM CHLORIDE 0.9 % IV BOLUS (SEPSIS)
1000.0000 mL | Freq: Once | INTRAVENOUS | Status: AC
  Administered 2016-09-25: 1000 mL via INTRAVENOUS

## 2016-09-25 MED ORDER — SODIUM CHLORIDE 0.9 % IV SOLN
INTRAVENOUS | Status: DC
  Administered 2016-09-25: 19:00:00 via INTRAVENOUS

## 2016-09-25 MED ORDER — ZIPRASIDONE MESYLATE 20 MG IM SOLR
INTRAMUSCULAR | Status: AC
  Administered 2016-09-25: 20 mg
  Filled 2016-09-25: qty 20

## 2016-09-25 NOTE — ED Notes (Signed)
Beta hcg <5  EDP notified

## 2016-09-25 NOTE — ED Notes (Signed)
etoh 385 EDP notified

## 2016-09-25 NOTE — ED Provider Notes (Signed)
[facility]-EMERGENCY DEPT Provider Note   CSN: [id number] Arrival date & time: 09/25/16  1520     History   Chief Complaint Chief Complaint  Patient presents with  . Medical Clearance    HPI Susan [name] is a 41 y.o. female.  The history is provided by the patient and the police. The history is limited by the condition of the patient (intoxicated).  Pt was seen at 1550. Per Police:  Pt found by Police wandering in the middle of the road, creating a disturbance at a church today. Police attempted to take her to an address she gave them, but Police were told she "had been kicked out."  Pt began to become more disorderly and were going to take her to jail, but pt then requested to come to the ED. Pt known well to Police for heavy etoh use. Pt herself is yelling and cursing at staff.    Past Medical History:  Diagnosis Date  . Alcohol abuse   . Asthma   . BV (bacterial vaginosis) 03/06/2013  . Chronic dental pain   . Chronic pelvic pain in female   . Depression   . Ectopic pregnancy   . Panic attacks   . Polysubstance abuse    cocaine, opiates, marijuana  . Trichomonas vaginitis     Patient Active Problem List   Diagnosis Date Noted  . Heavy smoker 02/03/2016  . Cocaine use 02/03/2016  . Chronic, continuous use of opioids 03/24/2015  . Depression with anxiety 03/24/2015  . Poor dentition 03/24/2015  . CLOSED FRACTURE DISTAL PHALANX OR PHALANGES HAND 06/05/2009  . Sprain of ankle, unspecified site 05/28/2009    Past Surgical History:  Procedure Laterality Date  . ECTOPIC PREGNANCY SURGERY      OB History    Gravida Para Term Preterm AB Living   7 4 4   3 4    SAB TAB Ectopic Multiple Live Births     2 1 0 4       Home Medications    Prior to Admission medications   Medication Sig Start Date End Date Taking? Authorizing Provider  cephALEXin (KEFLEX) 500 MG capsule Take 1 capsule (500 mg total) by mouth 4 (four) times daily. 12/30/15   Vanetta Mulders, MD    guaiFENesin-codeine (ROBITUSSIN AC) 100-10 MG/5ML syrup Take 10 mLs by mouth 3 (three) times daily as needed. 09/06/16   Tammy Triplett, PA-C  HYDROcodone-acetaminophen (NORCO/VICODIN) 5-325 MG tablet Take 1 tablet by mouth every 6 (six) hours as needed for moderate pain (Must last 14 days.Do not take and drive a car or use machinery.). 07/27/16   Darreld Mclean, MD  ibuprofen (ADVIL,MOTRIN) 800 MG tablet Take 1 tablet (800 mg total) by mouth 3 (three) times daily. 01/15/16   Eber Hong, MD  medroxyPROGESTERone (DEPO-PROVERA) 150 MG/ML injection Inject 1 mL (150 mg total) into the muscle every 3 (three) months. 02/03/16   Cheral Marker, CNM  metroNIDAZOLE (FLAGYL) 500 MG tablet Take 1 tablet (500 mg total) by mouth 2 (two) times daily. 07/15/16   Burgess Amor, PA-C  metroNIDAZOLE (METROGEL VAGINAL) 0.75 % vaginal gel Place 1 Applicatorful vaginally 2 (two) times daily. 06/10/16   Elpidio Anis, PA-C  naproxen (NAPROSYN) 500 MG tablet Take 1 tablet (500 mg total) by mouth 2 (two) times daily with a meal. 09/06/16   Tammy Triplett, PA-C  Prenatal Vit-Fe Fumarate-FA (PRENATAL VITAMINS) 28-0.8 MG TABS Take 1 tablet by mouth daily. 12/30/15   Vanetta Mulders, MD  Family History Family History  Problem Relation Age of Onset  . COPD Mother   . Stroke Mother   . Heart disease Maternal Grandmother   . Hypertension Maternal Grandmother   . Stroke Maternal Grandfather   . Asthma Sister   . Down syndrome Sister     Social History Social History  Substance Use Topics  . Smoking status: Current Every Day Smoker    Packs/day: 1.00    Types: Cigarettes  . Smokeless tobacco: Never Used  . Alcohol use Yes     Comment: occ     Allergies   Patient has no known allergies.   Review of Systems Review of Systems  Unable to perform ROS: Other     Physical Exam Updated Vital Signs BP 99/62   Pulse 80   Temp 97.4 F (36.3 C) (Oral)   Resp 18   SpO2 94%   Physical Exam 1555: Physical  examination:  Nursing notes reviewed; Vital signs and O2 SAT reviewed;  Constitutional: Disheveled, In no acute distress; Head:  Normocephalic, atraumatic; Eyes: EOMI, PERRL, No scleral icterus; ENMT: Mouth and pharynx normal, Mucous membranes moist; Neck: Supple, Full range of motion; Cardiovascular: Regular rate and rhythm; Respiratory: Breath sounds clear, No wheezes.  Speaking full sentences with ease, Normal respiratory effort/excursion; Chest: No deformity, Movement normal; Abdomen: Nondistended; Extremities: No deformity.; Neuro: Awake, alert. Major CN grossly intact.  Speech slurred. No facial droop. No gross focal motor deficits in extremities..; Skin: Color normal, Warm, Dry.; Psych:  Agitated, yelling and cursing at staff and Police.     ED Treatments / Results  Labs (all labs ordered are listed, but only abnormal results are displayed)   EKG  EKG Interpretation None       Radiology No results found.  Procedures Procedures (including critical care time)  Medications Ordered in ED Medications  ziprasidone (GEODON) 20 MG injection (20 mg  Given 09/25/16 1558)     Initial Impression / Assessment and Plan / ED Course  I have reviewed the triage vital signs and the nursing notes.  Pertinent labs & imaging results that were available during my care of the patient were reviewed by me and considered in my medical decision making (see chart for details).  MDM Reviewed: previous chart, nursing note and vitals Reviewed previous: labs Interpretation: labs   Results for orders placed or performed during the hospital encounter of 09/25/16  Comprehensive metabolic panel  Result Value Ref Range   Sodium 140 135 - 145 mmol/L   Potassium 3.4 (L) 3.5 - 5.1 mmol/L   Chloride 104 101 - 111 mmol/L   CO2 24 22 - 32 mmol/L   Glucose, Bld 103 (H) 65 - 99 mg/dL   BUN 10 6 - 20 mg/dL   Creatinine, Ser 1.61 0.44 - 1.00 mg/dL   Calcium 8.6 (L) 8.9 - 10.3 mg/dL   Total Protein 7.1 6.5 -  8.1 g/dL   Albumin 4.3 3.5 - 5.0 g/dL   AST 17 15 - 41 U/L   ALT 16 14 - 54 U/L   Alkaline Phosphatase 52 38 - 126 U/L   Total Bilirubin 0.3 0.3 - 1.2 mg/dL   GFR calc non Af Amer >60 >60 mL/min   GFR calc Af Amer >60 >60 mL/min   Anion gap 12 5 - 15  Ethanol  Result Value Ref Range   Alcohol, Ethyl (B) 385 (HH) <5 mg/dL  cbc  Result Value Ref Range   WBC 10.7 (H) 4.0 -  10.5 K/uL   RBC 4.55 3.87 - 5.11 MIL/uL   Hemoglobin 15.0 12.0 - 15.0 g/dL   HCT 16.144.1 09.636.0 - 04.546.0 %   MCV 96.9 78.0 - 100.0 fL   MCH 33.0 26.0 - 34.0 pg   MCHC 34.0 30.0 - 36.0 g/dL   RDW 40.912.9 81.111.5 - 91.415.5 %   Platelets 310 150 - 400 K/uL  Rapid urine drug screen (hospital performed)  Result Value Ref Range   Opiates NONE DETECTED NONE DETECTED   Cocaine POSITIVE (A) NONE DETECTED   Benzodiazepines NONE DETECTED NONE DETECTED   Amphetamines NONE DETECTED NONE DETECTED   Tetrahydrocannabinol NONE DETECTED NONE DETECTED   Barbiturates NONE DETECTED NONE DETECTED  I-Stat beta hCG blood, ED  Result Value Ref Range   I-stat hCG, quantitative <5.0 <5 mIU/mL   Comment 3             1555:  Pt yelling at ED staff and Police, failing about on stretcher. Police handcuffed pt to stretcher for her and staff safety. Pt's behavior continued to escalate. IM geodon given.   2050:  Pt has slept most of her ED visit. IVF given. Pt is now awake/alert, resps easy, calm/cooperative, NAD. Pt has tol PO well without N/V. Pt has ambulated with steady gait. Pt denies SI/SA, no HI, no hallucinations. Pt states she wants to go home now and has called her mother who will come pick her up. Police state pt is not under arrest or IVC. Will d/c pt to home with her mother.    Final Clinical Impressions(s) / ED Diagnoses   Final diagnoses:  None    New Prescriptions New Prescriptions   No medications on file      Samuel JesterKathleen Monya Kozakiewicz, DO 09/29/16 1617

## 2016-09-25 NOTE — ED Notes (Signed)
Pt is up in room walking around, a/o x 3.

## 2016-09-25 NOTE — ED Notes (Signed)
Pt yelling and cursing at staff.  attempted to take vs and pt flails out arms and says get away from me bitch

## 2016-09-25 NOTE — ED Notes (Signed)
Pt denies SI or HI. Dr. Clarene DukeMcmanus updated on pt status at this time. Pt reports that she lives with her mother and her mother will be coming to get her at time of d/c.

## 2016-09-25 NOTE — Discharge Instructions (Signed)
Substance Abuse Treatment Programs ° °Intensive Outpatient Programs °[facility]     °[address]      °[place], [place]                   °[phone]      ° °[facility] °[address] °[place], [place] °[phone] ° °[facility]     °(Inpatient and outpatient)     °[address]           °[phone]   ° °[facility] °[phone] (Suboxone and Methadone) ° °[address]      °[place], [place] [address]      °[phone]      ° °[address] °[place], [place] °852-3033 ° °[facility] (Outpatient/Inpatient, Chemical)    °(insurance only) [phone]      °       °[facility] (Groups & Residential) °[place], [place] °[phone] ° °   °[facility]     °[address]     °[place], [place]      °[phone]      ° °[facility] (for caregivers and family) °[address] °[place], [place] °336-299-4655 ° ° ° ° ° °Residential Treatment Programs °[facility]      °[address], [place], [place] [address]  °[phone]      ° °[facility] °[address] [place], [place] [address] °[phone] ° °[facility]        °336-248-8914      ° °[facility] °[phone] ° °[facility]             °[address]                                         °[place], [place]                                                °[phone] or 336-784-9470                              ° °[facility] [place] °[address] °[place], [address] °[phone] ° °[facility]    °[address]      °, [place]     °[phone]      ° °[facility] °[address] °, [place] °336-285-9073 ° °[facility]   °[address]     °[place], [place] [address]     °[phone]      °Admissions: 8am-3pm M-F ° °[facility] °[address] °[place] °336-227-7417 ° °[facility]: Residential Program (90 Days)   °[place], [place]      °336-725-8389 or 800-758-6077    ° °[facility] °[place], [place] °(Walk in Hours over the weekend or by referral) ° °[facility] °[address], [place], [place] [address] °(336) [phone] ° °Crisis Mobile: [facility]:  1-877-626-1772 (for crisis response 24 hours a day) °[facility] Hotline:      1-800-256-2452 °Outpatient Psychiatry and Counseling ° °[facility]: Mobile Crisis   Management 24 hours:  1-877-626-1772 ° °[facility] sliding scale fee and walk in schedule: M-F 8am-12pm/1pm-3pm °[address]  °[place], [place] [address] °[phone] ° °[facility] °[address] °[place], [place] [address] °336-703-9650 ° °[facility] ([facility])- new patient walk-in appointments available Monday - Friday 8am -3pm.          °[address] °[place], [place] [address] °336-676-6840 or crisis line- 336-676-6905 ° °[facility] Intensive Outpatient Therapy Program °[address] °[place], [place] [address] °336-832-9804 ° °[facility]                  °Crisis Services      °[phone]      °[address]     °[place], [place] [address]                ° °[facility]   °[facility] °[phone] °[address] °[place], [place] [address] ° ° °[facility]          °[address]  °[place], [place] [address]       °([phone]) [phone] ° °[facility] °[address] °[place], [place] [address] °336-292-1510 ° °[facility]    °[address]    °[place], [place] [address]     °336-632-3505      ° °Parish McKinney, MD     °[address]     °[place] [address]     °336-282-1251     °  °[facility] °[address] °[place] [address] ° °[facility]     °[address]     °[place], [place]      °336-542-2076      ° °[facility] °Susan Ahluwalia, MD °[address] °[place], [place] [address] °336-420-9558 ° °[facility]     °[address]     °[place], [place] [address]     °336-274-1237      ° °[facility] °[address] °[place], [place] [address] °336-854-4450 °Resources for Temporary Residential Assistance/Crisis Centers ° °DAY CENTERS °[facility] °M-F 8am-3pm   °[address] [place], [place] [address]   336-332-0824 °Services include: laundry, barbering, support groups, case management, phone  & computer access, showers, AA/NA mtgs, mental health/substance abuse nurse, job skills class, disability information, VA assistance, spiritual classes, etc.  ° °HOMELESS SHELTERS ° °[facility]     °[facility]   °[address], [place]     °336.271.5959       °       °[facility] (women and children)       °[address] °[place], [place] [address] °336-275-0820 °[email or web address] for application and process °Application Required ° °[facility]   °[address]    °[place] [address]     °[phone]       °             °[facility] °[address] °[place], [place] [address] °336.273.5572 °336-235-0363(schedule application appt.) °Application Required ° °[facility] (women only)    °[address]     °[place], [place] [address]     °336-884-1039      °  Intake starts 6pm daily Need valid ID, SSC, & Police report [facility] [place] [address] [place], [place] [phone] Application Required  [facility] ([facility] only)     [address]      [place], [place]     [phone]       [facility] (Pregnant women only) [address] [place], [place] [phone]  [facility]      [address]      [place], [place] [address]     [phone]             [facility] [address] [place], [place] [phone] 90 day commitment/SA/Application process  [facility](men only)     [address]     [place]     [phone]       Check-in at [facility]7pm            [facility] [address] [place], [place] [phone] Men/Women/Women and Children must be there by 7 pm  [facility] [place] [phone]                  Take your usual prescriptions as previously directed.  Call your regular medical doctor on Monday to schedule a follow up appointment within the next week. Call the resources given to you today if you are interested in detox. Return to the Emergency Department immediately sooner if worsening.

## 2016-09-25 NOTE — ED Triage Notes (Signed)
Patient brought in via [employer] states patient was found wondering in middle of road intoxicated. Patient taken to address given in which they stated she has "been kicked out." Patient taken to different address and started to become disorderly in which she was going to be arrested but requested to come to hospital for treatment. Patient will not answer if she has SI or HI. Patient smells of ETOH, cussing, disheveled, and crying.

## 2016-10-07 ENCOUNTER — Ambulatory Visit: Payer: Medicaid Other | Admitting: Orthopaedic Surgery

## 2016-10-12 ENCOUNTER — Ambulatory Visit: Payer: Medicaid Other | Admitting: Orthopaedic Surgery

## 2016-11-07 ENCOUNTER — Emergency Department (HOSPITAL_COMMUNITY)
Admission: EM | Admit: 2016-11-07 | Discharge: 2016-11-07 | Disposition: A | Discharge status: Home / Self Care | Payer: Medicaid Other | Attending: Emergency Medicine | Admitting: Emergency Medicine

## 2016-11-07 ENCOUNTER — Encounter (HOSPITAL_COMMUNITY): Payer: Self-pay

## 2016-11-07 DIAGNOSIS — Z791 Long term (current) use of non-steroidal anti-inflammatories (NSAID): Secondary | ICD-10-CM | POA: Insufficient documentation

## 2016-11-07 DIAGNOSIS — F1910 Other psychoactive substance abuse, uncomplicated: Secondary | ICD-10-CM | POA: Diagnosis not present

## 2016-11-07 DIAGNOSIS — O99310 Alcohol use complicating pregnancy, unspecified trimester: Secondary | ICD-10-CM | POA: Diagnosis present

## 2016-11-07 DIAGNOSIS — J45909 Unspecified asthma, uncomplicated: Secondary | ICD-10-CM | POA: Insufficient documentation

## 2016-11-07 DIAGNOSIS — O99330 Smoking (tobacco) complicating pregnancy, unspecified trimester: Secondary | ICD-10-CM | POA: Diagnosis not present

## 2016-11-07 DIAGNOSIS — F17210 Nicotine dependence, cigarettes, uncomplicated: Secondary | ICD-10-CM | POA: Diagnosis not present

## 2016-11-07 DIAGNOSIS — Z3A00 Weeks of gestation of pregnancy not specified: Secondary | ICD-10-CM | POA: Diagnosis not present

## 2016-11-07 DIAGNOSIS — F10920 Alcohol use, unspecified with intoxication, uncomplicated: Secondary | ICD-10-CM

## 2016-11-07 DIAGNOSIS — Z79899 Other long term (current) drug therapy: Secondary | ICD-10-CM | POA: Insufficient documentation

## 2016-11-07 DIAGNOSIS — F10120 Alcohol abuse with intoxication, uncomplicated: Secondary | ICD-10-CM | POA: Insufficient documentation

## 2016-11-07 DIAGNOSIS — O99320 Drug use complicating pregnancy, unspecified trimester: Secondary | ICD-10-CM | POA: Insufficient documentation

## 2016-11-07 LAB — COMPREHENSIVE METABOLIC PANEL
ALT: 13 U/L — ABNORMAL LOW (ref 14–54)
AST: 19 U/L (ref 15–41)
Albumin: 4.5 g/dL (ref 3.5–5.0)
Alkaline Phosphatase: 60 U/L (ref 38–126)
Anion gap: 9 (ref 5–15)
BUN: 9 mg/dL (ref 6–20)
CO2: 20 mmol/L — ABNORMAL LOW (ref 22–32)
Calcium: 9.2 mg/dL (ref 8.9–10.3)
Chloride: 108 mmol/L (ref 101–111)
Creatinine, Ser: 0.52 mg/dL (ref 0.44–1.00)
GFR calc Af Amer: >60 mL/min (ref >60)
GFR calc non Af Amer: >60 mL/min (ref >60)
Glucose, Bld: 102 mg/dL — ABNORMAL HIGH (ref 65–99)
Potassium: 4 mmol/L (ref 3.5–5.1)
Sodium: 137 mmol/L (ref 135–145)
Total Bilirubin: 0.3 mg/dL (ref 0.3–1.2)
Total Protein: 7.7 g/dL (ref 6.5–8.1)

## 2016-11-07 LAB — CBC WITH DIFFERENTIAL/PLATELET
Basophils Absolute: 0.1 10*3/uL (ref 0.0–0.1)
Basophils Relative: 0 %
Eosinophils Absolute: 0.1 10*3/uL (ref 0.0–0.7)
Eosinophils Relative: 1 %
HCT: 40.7 % (ref 36.0–46.0)
Hemoglobin: 14.1 g/dL (ref 12.0–15.0)
Lymphocytes Relative: 39 %
Lymphs Abs: 4.8 10*3/uL — ABNORMAL HIGH (ref 0.7–4.0)
MCH: 33.9 pg (ref 26.0–34.0)
MCHC: 34.6 g/dL (ref 30.0–36.0)
MCV: 97.8 fL (ref 78.0–100.0)
Monocytes Absolute: 0.6 10*3/uL (ref 0.1–1.0)
Monocytes Relative: 5 %
Neutro Abs: 6.8 10*3/uL (ref 1.7–7.7)
Neutrophils Relative %: 55 %
Platelets: 354 10*3/uL (ref 150–400)
RBC: 4.16 MIL/uL (ref 3.87–5.11)
RDW: 13 % (ref 11.5–15.5)
WBC: 12.4 10*3/uL — ABNORMAL HIGH (ref 4.0–10.5)

## 2016-11-07 LAB — RAPID URINE DRUG SCREEN, HOSP PERFORMED
Amphetamines: NOT DETECTED
Barbiturates: NOT DETECTED
Benzodiazepines: NOT DETECTED
Cocaine: POSITIVE — AB
Opiates: NOT DETECTED
Tetrahydrocannabinol: NOT DETECTED

## 2016-11-07 LAB — ACETAMINOPHEN LEVEL: Acetaminophen (Tylenol), Serum: <10 ug/mL — ABNORMAL LOW (ref 10–30)

## 2016-11-07 LAB — PREGNANCY, URINE: Preg Test, Ur: POSITIVE — AB

## 2016-11-07 LAB — ETHANOL: Alcohol, Ethyl (B): 341 mg/dL (ref <5)

## 2016-11-07 MED ORDER — PRENATAL VITAMIN 27-0.8 MG PO TABS: 1.0000 | ORAL_TABLET | Freq: Every morning | ORAL | 0 refills | Status: DC

## 2016-11-07 NOTE — ED Provider Notes (Signed)
[facility]-EM[facility] DEPT Provider Note   CSN: [id number] Arrival date & time: 11/07/16  1739     History   Chief Complaint Chief Complaint  Patient presents with  . Medical Clearance  Level V caveat altered mental status  HPI Susan [name] is a 41 y.o. female. She brought in in police custody for "" medical clearance." Patient reportedly had been drinking alcohol earlier today. Patient refuses to answer questions she is cursing loudly at the police officer and at staff. Speech is slurred but comprehensible. She denies pain anywhere. She is asking for a pregnancy test.  HPI  Past Medical History:  Diagnosis Date  . Alcohol abuse   . Asthma   . BV (bacterial vaginosis) 03/06/2013  . Chronic dental pain   . Chronic pelvic pain in female   . Depression   . Ectopic pregnancy   . Panic attacks   . Polysubstance abuse    cocaine, opiates, marijuana  . Trichomonas vaginitis     Patient Active Problem List   Diagnosis Date Noted  . Heavy smoker 02/03/2016  . Cocaine use 02/03/2016  . Chronic, continuous use of opioids 03/24/2015  . Depression with anxiety 03/24/2015  . Poor dentition 03/24/2015  . CLOSED FRACTURE DISTAL PHALANX OR PHALANGES HAND 06/05/2009  . Sprain of ankle, unspecified site 05/28/2009    Past Surgical History:  Procedure Laterality Date  . ECTOPIC PREGNANCY SURGERY      OB History    Gravida Para Term Preterm AB Living   SAB TAB Ectopic Multiple Live Births     2 1 0 4       Home Medications    Prior to Admission medications   Medication Sig Start Date End Date Taking? Authorizing Provider  cephALEXin (KEFLEX) 500 MG capsule Take 1 capsule (500 mg total) by mouth 4 (four) times daily. 12/30/15   Vanetta Mulders, MD  guaiFENesin-codeine (ROBITUSSIN AC) 100-10 MG/5ML syrup Take 10 mLs by mouth 3 (three) times daily as needed. 09/06/16   Tammy Triplett, PA-C  HYDROcodone-acetaminophen (NORCO/VICODIN) 5-325 MG tablet Take 1 tablet by mouth  every 6 (six) hours as needed for moderate pain (Must last 14 days.Do not take and drive a car or use machinery.). 07/27/16   Darreld Mclean, MD  ibuprofen (ADVIL,MOTRIN) 800 MG tablet Take 1 tablet (800 mg total) by mouth 3 (three) times daily. 01/15/16   Eber Hong, MD  medroxyPROGESTERone (DEPO-PROVERA) 150 MG/ML injection Inject 1 mL (150 mg total) into the muscle every 3 (three) months. 02/03/16   Cheral Marker, CNM  metroNIDAZOLE (FLAGYL) 500 MG tablet Take 1 tablet (500 mg total) by mouth 2 (two) times daily. 07/15/16   Burgess Amor, PA-C  metroNIDAZOLE (METROGEL VAGINAL) 0.75 % vaginal gel Place 1 Applicatorful vaginally 2 (two) times daily. 06/10/16   Elpidio Anis, PA-C  naproxen (NAPROSYN) 500 MG tablet Take 1 tablet (500 mg total) by mouth 2 (two) times daily with a meal. 09/06/16   Tammy Triplett, PA-C  Prenatal Vit-Fe Fumarate-FA (PRENATAL VITAMINS) 28-0.8 MG TABS Take 1 tablet by mouth daily. 12/30/15   Vanetta Mulders, MD    Family History Family History  Problem Relation Age of Onset  . COPD Mother   . Stroke Mother   . Heart disease Maternal Grandmother   . Hypertension Maternal Grandmother   . Stroke Maternal Grandfather   . Asthma Sister   . Down syndrome Sister     Social History Social  History  Substance Use Topics  . Smoking status: Current Every Day Smoker    Packs/day: 1.00    Types: Cigarettes  . Smokeless tobacco: Never Used  . Alcohol use Yes     Comment: occ     Allergies   Patient has no known allergies.   Review of Systems Review of Systems  Unable to perform ROS: Mental status change     Physical Exam Updated Vital Signs BP (!) 140/99 (BP Location: Right Arm)   Pulse 93   Temp 98.8 F (37.1 C) (Oral)   Ht  (1.626 m)   Wt 147 lb (66.7 kg)   SpO2 99%   BMI 25.23 kg/m   Physical Exam  Constitutional:  Unkempt, strong odor of alcohol on breath. Yelling loudly.  HENT:  Head: Normocephalic and atraumatic.  Marland Kitchen No facial asymmetry.  Poor dentition  Eyes: Conjunctivae are normal. Pupils are equal, round, and reactive to light.  Neck: Neck supple. No tracheal deviation present. No thyromegaly present.  Cardiovascular: Normal rate and regular rhythm.   No murmur heard. Pulmonary/Chest: Effort normal and breath sounds normal.  Abdominal: Soft. She exhibits no distension. There is no tenderness.  Musculoskeletal: Normal range of motion. She exhibits no edema or tenderness.  Neurological: She is alert. Coordination normal.  Gait normal  Skin: Skin is warm and dry. No rash noted.  Psychiatric:  Argumentative  Nursing note and vitals reviewed.    ED Treatments / Results  Labs (all labs ordered are listed, but only abnormal results are displayed) Labs Reviewed  COMPREHENSIVE METABOLIC PANEL  ETHANOL  CBC WITH DIFFERENTIAL/PLATELET  RAPID URINE DRUG SCREEN, HOSP PERFORMED  PREGNANCY, URINE  ACETAMINOPHEN LEVEL    EKG  EKG Interpretation None       Radiology No results found.  Procedures Procedures (including critical care time)  Medications Ordered in ED Medications - No data to display   Initial Impression / Assessment and Plan / ED Course  I have reviewed the triage vital signs and the nursing notes.  Pertinent labs & imaging results that were available during my care of the patient were reviewed by me and considered in my medical decision making (see chart for details).     8:30 PM patient is alert and auditory speech is no longer slurred. She continues to be argumentative. Pulse, counted by me is 120 on a likely secondary to agitation. She is medically stable and medically clear for transfer to jail were officers to me that she will be supervised. She is referred to [facility] outpatient clinic for prenatal care. Prescription prenatal vitamins. She is also given the resource guide for some is abuse.  Final Clinical Impressions(s) / ED Diagnoses  Diagnostic #1 alcohol intoxication Final diagnoses:    None  #2Pregnancy #3 polysubstance abuse  New Prescriptions New Prescriptions   No medications on file     Doug Sou, MD 11/07/16 2028

## 2016-11-07 NOTE — Discharge Instructions (Signed)
All the women's outpatient clinic to arrange for prenatal care. The prenatal vitamins as prescribed. Call any of the numbers on the resource guide to get help with her drug and alcohol problem. Take the prenatal vitamins as prescribed

## 2016-11-07 NOTE — ED Notes (Signed)
Pt is drunk, belligerent, combative, and will not follow commands.  Pt is under the supervision of [employer] at this time.

## 2016-11-07 NOTE — ED Triage Notes (Signed)
Patient brought in by [facility] PD for intoxication. Per PD, they need medical clearance to take patient to jail. Patient yelling and cursing at staff, will not follow commands or answer questions.

## 2016-11-07 NOTE — ED Notes (Signed)
CRITICAL VALUE ALERT  Critical value received: ETOH 341 Date of notification:  11/07/16 Time of notification:  2001 Critical value read back:Yes.   Nurse who received alert:  GMP MD notified (1st page):  Jacobowitz Time of first page:  2001 Responding MD: Rennis Chris Time MD responded: 2001

## 2017-01-04 ENCOUNTER — Encounter (HOSPITAL_COMMUNITY): Payer: Self-pay | Admitting: *Deleted

## 2017-01-04 ENCOUNTER — Emergency Department (HOSPITAL_COMMUNITY)
Admission: EM | Admit: 2017-01-04 | Discharge: 2017-01-04 | Disposition: A | Discharge status: Other Acute Inpatient Hospital | Payer: Medicaid Other | Attending: Emergency Medicine | Admitting: Emergency Medicine

## 2017-01-04 DIAGNOSIS — Z79899 Other long term (current) drug therapy: Secondary | ICD-10-CM | POA: Diagnosis not present

## 2017-01-04 DIAGNOSIS — J45909 Unspecified asthma, uncomplicated: Secondary | ICD-10-CM | POA: Insufficient documentation

## 2017-01-04 DIAGNOSIS — F1410 Cocaine abuse, uncomplicated: Secondary | ICD-10-CM | POA: Insufficient documentation

## 2017-01-04 DIAGNOSIS — Z791 Long term (current) use of non-steroidal anti-inflammatories (NSAID): Secondary | ICD-10-CM | POA: Diagnosis not present

## 2017-01-04 DIAGNOSIS — F410 Panic disorder [episodic paroxysmal anxiety] without agoraphobia: Secondary | ICD-10-CM | POA: Diagnosis present

## 2017-01-04 DIAGNOSIS — F329 Major depressive disorder, single episode, unspecified: Secondary | ICD-10-CM | POA: Diagnosis not present

## 2017-01-04 DIAGNOSIS — F17210 Nicotine dependence, cigarettes, uncomplicated: Secondary | ICD-10-CM | POA: Insufficient documentation

## 2017-01-04 DIAGNOSIS — F32A Depression, unspecified: Secondary | ICD-10-CM

## 2017-01-04 LAB — CBC
HCT: 33.8 % — ABNORMAL LOW (ref 36.0–46.0)
Hemoglobin: 11.7 g/dL — ABNORMAL LOW (ref 12.0–15.0)
MCH: 33.3 pg (ref 26.0–34.0)
MCHC: 34.6 g/dL (ref 30.0–36.0)
MCV: 96.3 fL (ref 78.0–100.0)
Platelets: 279 10*3/uL (ref 150–400)
RBC: 3.51 MIL/uL — ABNORMAL LOW (ref 3.87–5.11)
RDW: 12.8 % (ref 11.5–15.5)
WBC: 12 10*3/uL — ABNORMAL HIGH (ref 4.0–10.5)

## 2017-01-04 LAB — RAPID URINE DRUG SCREEN, HOSP PERFORMED
Amphetamines: NOT DETECTED
Barbiturates: NOT DETECTED
Benzodiazepines: NOT DETECTED
Cocaine: POSITIVE — AB
Opiates: NOT DETECTED
Tetrahydrocannabinol: NOT DETECTED

## 2017-01-04 LAB — ETHANOL: Alcohol, Ethyl (B): 152 mg/dL — ABNORMAL HIGH (ref <5)

## 2017-01-04 LAB — COMPREHENSIVE METABOLIC PANEL
ALT: 13 U/L — ABNORMAL LOW (ref 14–54)
AST: 18 U/L (ref 15–41)
Albumin: 4 g/dL (ref 3.5–5.0)
Alkaline Phosphatase: 36 U/L — ABNORMAL LOW (ref 38–126)
Anion gap: 10 (ref 5–15)
BUN: 8 mg/dL (ref 6–20)
CO2: 22 mmol/L (ref 22–32)
Calcium: 8.7 mg/dL — ABNORMAL LOW (ref 8.9–10.3)
Chloride: 104 mmol/L (ref 101–111)
Creatinine, Ser: 0.4 mg/dL — ABNORMAL LOW (ref 0.44–1.00)
GFR calc Af Amer: >60 mL/min (ref >60)
GFR calc non Af Amer: >60 mL/min (ref >60)
Glucose, Bld: 75 mg/dL (ref 65–99)
Potassium: 3.9 mmol/L (ref 3.5–5.1)
Sodium: 136 mmol/L (ref 135–145)
Total Bilirubin: 0.4 mg/dL (ref 0.3–1.2)
Total Protein: 6.6 g/dL (ref 6.5–8.1)

## 2017-01-04 LAB — I-STAT BETA HCG BLOOD, ED (MC, WL, AP ONLY): I-stat hCG, quantitative: >2000 m[IU]/mL — ABNORMAL HIGH (ref <5)

## 2017-01-04 LAB — ACETAMINOPHEN LEVEL: Acetaminophen (Tylenol), Serum: <10 ug/mL — ABNORMAL LOW (ref 10–30)

## 2017-01-04 LAB — SALICYLATE LEVEL: Salicylate Lvl: <7 mg/dL (ref 2.8–30.0)

## 2017-01-04 MED ORDER — ONDANSETRON HCL 4 MG PO TABS: 4.0000 mg | ORAL_TABLET | Freq: Three times a day (TID) | ORAL | Status: DC | PRN

## 2017-01-04 MED ORDER — ACETAMINOPHEN 325 MG PO TABS: 650.0000 mg | ORAL_TABLET | ORAL | Status: DC | PRN

## 2017-01-04 MED ORDER — PRENATAL VITAMIN 27-0.8 MG PO TABS: 1.0000 | ORAL_TABLET | Freq: Every morning | ORAL | Status: DC

## 2017-01-04 NOTE — ED Triage Notes (Signed)
Pt was brought in by a friend.  Pt states that "I'm going to kill myself".  Pt has aggressive outbursts where she screams "I'm going to kill myself", "get away from me".  Pt states that she is pregnant.  No info on LMP and no details on this.  Pt cant tell me of her plan or any precipitating factors.

## 2017-01-04 NOTE — ED Notes (Signed)
Patient allowed to use phone to call mother.

## 2017-01-04 NOTE — ED Notes (Signed)
Pt placed in paper scrubs, wanded, and belongings placed in locker.
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
## 2017-01-04 NOTE — ED Provider Notes (Signed)
[facility]-EM[facility] DE[facility] Provider Note   CSN: [id number] Arrival date & time: 01/04/17  1449  By signing my name below, I, Freida Busmaniana Omoyeni, attest that this documentation has been prepared under the direction and in the presence of Raeford RazorKohut, Paulmichael Schreck, MD . Electronically Signed: Freida Busmaniana Omoyeni, Scribe. 01/04/2017. 4:25 PM.  History   Chief Complaint Chief Complaint  Patient presents with  . Suicidal     The history is provided by the patient. No language interpreter was used.     HPI Comments:  Susan [name] is a 41 y.o. female with a history of depression, panic attacks, and alcohol and polysubstance abuse, who presents to the Emergency Department today seeking "help". She states she is suicidal and has a h/o suicide attempts in the past. She notes she has tried to strangle herself with sheets in the past. She denies a recent increase in stressors. Pt states she is currently pregnant. LNMP was ~ 2 months ago; states she had a positive pregnancy test on April 1st, 2018. She states she is supposed to be on Prozac and trazodone but has not taken this meds in ~ 1 year due to lack of insurance; [facility] She has no acute physical complaints or symptoms at this time.     Past Medical History:  Diagnosis Date  . Alcohol abuse   . Asthma   . BV (bacterial vaginosis) 03/06/2013  . Chronic dental pain   . Chronic pelvic pain in female   . Depression   . Ectopic pregnancy   . Panic attacks   . Polysubstance abuse    cocaine, opiates, marijuana  . Trichomonas vaginitis     Patient Active Problem List   Diagnosis Date Noted  . Heavy smoker 02/03/2016  . Cocaine use 02/03/2016  . Chronic, continuous use of opioids 03/24/2015  . Depression with anxiety 03/24/2015  . Poor dentition 03/24/2015  . CLOSED FRACTURE DISTAL PHALANX OR PHALANGES HAND 06/05/2009  . Sprain of ankle, unspecified site 05/28/2009    Past Surgical History:  Procedure Laterality Date  .  ECTOPIC PREGNANCY SURGERY      OB History    Gravida Para Term Preterm AB Living   7 4 4   3 4    SAB TAB Ectopic Multiple Live Births     2 1 0 4       Home Medications    Prior to Admission medications   Medication Sig Start Date End Date Taking? Authorizing Provider  cephALEXin (KEFLEX) 500 MG capsule Take 1 capsule (500 mg total) by mouth 4 (four) times daily. 12/30/15   Vanetta MuldersZackowski, Scott, MD  guaiFENesin-codeine (ROBITUSSIN AC) 100-10 MG/5ML syrup Take 10 mLs by mouth 3 (three) times daily as needed. 09/06/16   Triplett, Tammy, PA-C  HYDROcodone-acetaminophen (NORCO/VICODIN) 5-325 MG tablet Take 1 tablet by mouth every 6 (six) hours as needed for moderate pain (Must last 14 days.Do not take and drive a car or use machinery.). 07/27/16   Darreld McleanKeeling, Wayne, MD  ibuprofen (ADVIL,MOTRIN) 800 MG tablet Take 1 tablet (800 mg total) by mouth 3 (three) times daily. 01/15/16   Eber HongMiller, Brian, MD  medroxyPROGESTERone (DEPO-PROVERA) 150 MG/ML injection Inject 1 mL (150 mg total) into the muscle every 3 (three) months. 02/03/16   Cheral MarkerBooker, Kimberly R, CNM  metroNIDAZOLE (FLAGYL) 500 MG tablet Take 1 tablet (500 mg total) by mouth 2 (two) times daily. 07/15/16   Burgess AmorIdol, Julie, PA-C  metroNIDAZOLE (METROGEL VAGINAL) 0.75 % vaginal gel Place 1  Applicatorful vaginally 2 (two) times daily. 06/10/16   Elpidio Anis, PA-C  naproxen (NAPROSYN) 500 MG tablet Take 1 tablet (500 mg total) by mouth 2 (two) times daily with a meal. 09/06/16   Triplett, Tammy, PA-C  Prenatal Vit-Fe Fumarate-FA (PRENATAL VITAMIN) 27-0.8 MG TABS Take 1 tablet by mouth every morning. 11/07/16   Doug Sou, MD    Family History Family History  Problem Relation Age of Onset  . COPD Mother   . Stroke Mother   . Heart disease Maternal Grandmother   . Hypertension Maternal Grandmother   . Stroke Maternal Grandfather   . Asthma Sister   . Down syndrome Sister     Social History Social History  Substance Use Topics  . Smoking status:  Current Every Day Smoker    Packs/day: 1.00    Types: Cigarettes  . Smokeless tobacco: Never Used  . Alcohol use Yes     Comment: occ     Allergies   Patient has no known allergies.   Review of Systems Review of Systems  Constitutional: Negative for chills and fever.  Respiratory: Negative for shortness of breath.   Cardiovascular: Negative for chest pain.  Psychiatric/Behavioral: Positive for suicidal ideas.  All other systems reviewed and are negative.    Physical Exam Updated Vital Signs BP (!) 145/87 (BP Location: Right Arm)   Pulse (!) 102   Temp 98.8 F (37.1 C) (Oral)   Resp 16   LMP  (LMP Unknown) Comment: Pt reports that she is pregnant.  She does not give me LMP or any additional information  SpO2 95%   Physical Exam  Constitutional: She is oriented to person, place, and time. She appears well-developed and well-nourished. No distress.  HENT:  Head: Normocephalic and atraumatic.  Eyes: EOM are normal.  Neck: Normal range of motion.  Cardiovascular: Normal rate, regular rhythm and normal heart sounds.   Pulmonary/Chest: Effort normal and breath sounds normal.  Abdominal: Soft. She exhibits no distension. There is no tenderness.  Musculoskeletal: Normal range of motion.  Neurological: She is alert and oriented to person, place, and time.  Skin: Skin is warm and dry.  Psychiatric:  Flat affect  Poor eye contact  Poor insight    Nursing note and vitals reviewed.    ED Treatments / Results  DIAGNOSTIC STUDIES:  Oxygen Saturation is 95% on RA, adequate by my interpretation.     Labs (all labs ordered are listed, but only abnormal results are displayed) Labs Reviewed  COMPREHENSIVE METABOLIC PANEL - Abnormal; Notable for the following:       Result Value   Creatinine, Ser 0.40 (*)    Calcium 8.7 (*)    ALT 13 (*)    Alkaline Phosphatase 36 (*)    All other components within normal limits  ETHANOL - Abnormal; Notable for the following:     Alcohol, Ethyl (B) 152 (*)    All other components within normal limits  ACETAMINOPHEN LEVEL - Abnormal; Notable for the following:    Acetaminophen (Tylenol), Serum <10 (*)    All other components within normal limits  CBC - Abnormal; Notable for the following:    WBC 12.0 (*)    RBC 3.51 (*)    Hemoglobin 11.7 (*)    HCT 33.8 (*)    All other components within normal limits  RAPID URINE DRUG SCREEN, HOSP PERFORMED - Abnormal; Notable for the following:    Cocaine POSITIVE (*)    All other components within normal  limits  I-STAT BETA HCG BLOOD, ED ([facility], [facility], [facility] ONLY) - Abnormal; Notable for the following:    I-stat hCG, quantitative >2,000.0 (*)    All other components within normal limits  SALICYLATE LEVEL    EKG  EKG Interpretation None       Radiology No results found.  Procedures Procedures (including critical care time)  Medications Ordered in ED Medications - No data to display   Initial Impression / Assessment and Plan / ED Course  I have reviewed the triage vital signs and the nursing notes.  Pertinent labs & imaging results that were available during my care of the patient were reviewed by me and considered in my medical decision making (see chart for details).     40yF with depression/SI. Medically cleared.   Final Clinical Impressions(s) / ED Diagnoses   Final diagnoses:  Depression, unspecified depression type    New Prescriptions New Prescriptions   No medications on file   I personally preformed the services scribed in my presence. The recorded information has been reviewed is accurate. Raeford Razor, MD.     Raeford Razor, MD 01/10/17 2033

## 2017-01-04 NOTE — ED Notes (Signed)
Pt ripping her hair out and ripped her bracelet off.  Security asked to wand pt

## 2017-01-04 NOTE — Progress Notes (Signed)
[facility].  Dr. Elna BreslowEappen accepting.  Call report to [phone].  CSW notified AP Nurse, Lorrie, notified. Pt. May be transported to arrive at 21:30.

## 2017-01-05 ENCOUNTER — Encounter (HOSPITAL_COMMUNITY): Payer: Self-pay | Admitting: *Deleted

## 2017-01-05 ENCOUNTER — Inpatient Hospital Stay (HOSPITAL_COMMUNITY): Payer: Medicaid Other

## 2017-01-05 ENCOUNTER — Inpatient Hospital Stay (HOSPITAL_COMMUNITY)
Admission: AD | Admit: 2017-01-05 | Discharge: 2017-01-07 | DRG: 781 | Disposition: A | Discharge status: Home / Self Care | Payer: Medicaid Other | Source: Intra-hospital | Attending: Obstetrics and Gynecology | Admitting: Obstetrics and Gynecology

## 2017-01-05 DIAGNOSIS — F1020 Alcohol dependence, uncomplicated: Secondary | ICD-10-CM | POA: Diagnosis not present

## 2017-01-05 DIAGNOSIS — F1220 Cannabis dependence, uncomplicated: Secondary | ICD-10-CM | POA: Diagnosis not present

## 2017-01-05 DIAGNOSIS — O99311 Alcohol use complicating pregnancy, first trimester: Secondary | ICD-10-CM | POA: Diagnosis present

## 2017-01-05 DIAGNOSIS — F1420 Cocaine dependence, uncomplicated: Secondary | ICD-10-CM | POA: Diagnosis present

## 2017-01-05 DIAGNOSIS — Y906 Blood alcohol level of 120-199 mg/100 ml: Secondary | ICD-10-CM | POA: Diagnosis present

## 2017-01-05 DIAGNOSIS — Z810 Family history of intellectual disabilities: Secondary | ICD-10-CM | POA: Diagnosis not present

## 2017-01-05 DIAGNOSIS — J45909 Unspecified asthma, uncomplicated: Secondary | ICD-10-CM | POA: Diagnosis present

## 2017-01-05 DIAGNOSIS — Z3490 Encounter for supervision of normal pregnancy, unspecified, unspecified trimester: Secondary | ICD-10-CM

## 2017-01-05 DIAGNOSIS — O99511 Diseases of the respiratory system complicating pregnancy, first trimester: Secondary | ICD-10-CM | POA: Diagnosis present

## 2017-01-05 DIAGNOSIS — O99351 Diseases of the nervous system complicating pregnancy, first trimester: Secondary | ICD-10-CM | POA: Diagnosis not present

## 2017-01-05 DIAGNOSIS — Z915 Personal history of self-harm: Secondary | ICD-10-CM

## 2017-01-05 DIAGNOSIS — O99321 Drug use complicating pregnancy, first trimester: Secondary | ICD-10-CM | POA: Diagnosis not present

## 2017-01-05 DIAGNOSIS — F410 Panic disorder [episodic paroxysmal anxiety] without agoraphobia: Secondary | ICD-10-CM | POA: Diagnosis present

## 2017-01-05 DIAGNOSIS — Z8759 Personal history of other complications of pregnancy, childbirth and the puerperium: Secondary | ICD-10-CM

## 2017-01-05 DIAGNOSIS — O99341 Other mental disorders complicating pregnancy, first trimester: Principal | ICD-10-CM | POA: Diagnosis present

## 2017-01-05 DIAGNOSIS — O99331 Smoking (tobacco) complicating pregnancy, first trimester: Secondary | ICD-10-CM | POA: Diagnosis not present

## 2017-01-05 DIAGNOSIS — N898 Other specified noninflammatory disorders of vagina: Secondary | ICD-10-CM | POA: Diagnosis present

## 2017-01-05 DIAGNOSIS — Z3A14 14 weeks gestation of pregnancy: Secondary | ICD-10-CM | POA: Diagnosis not present

## 2017-01-05 DIAGNOSIS — R1084 Generalized abdominal pain: Secondary | ICD-10-CM

## 2017-01-05 DIAGNOSIS — Z9119 Patient's noncompliance with other medical treatment and regimen: Secondary | ICD-10-CM | POA: Diagnosis not present

## 2017-01-05 DIAGNOSIS — F323 Major depressive disorder, single episode, severe with psychotic features: Secondary | ICD-10-CM | POA: Diagnosis present

## 2017-01-05 DIAGNOSIS — F332 Major depressive disorder, recurrent severe without psychotic features: Secondary | ICD-10-CM | POA: Diagnosis not present

## 2017-01-05 DIAGNOSIS — F17210 Nicotine dependence, cigarettes, uncomplicated: Secondary | ICD-10-CM | POA: Diagnosis not present

## 2017-01-05 DIAGNOSIS — R45851 Suicidal ideations: Secondary | ICD-10-CM | POA: Diagnosis present

## 2017-01-05 DIAGNOSIS — O23591 Infection of other part of genital tract in pregnancy, first trimester: Secondary | ICD-10-CM | POA: Diagnosis not present

## 2017-01-05 DIAGNOSIS — G4700 Insomnia, unspecified: Secondary | ICD-10-CM | POA: Diagnosis present

## 2017-01-05 DIAGNOSIS — F419 Anxiety disorder, unspecified: Secondary | ICD-10-CM | POA: Diagnosis present

## 2017-01-05 DIAGNOSIS — O26892 Other specified pregnancy related conditions, second trimester: Secondary | ICD-10-CM

## 2017-01-05 DIAGNOSIS — F1094 Alcohol use, unspecified with alcohol-induced mood disorder: Secondary | ICD-10-CM | POA: Diagnosis present

## 2017-01-05 MED ORDER — LOPERAMIDE HCL 2 MG PO CAPS: 2.0000 mg | ORAL_CAPSULE | ORAL | Status: DC | PRN

## 2017-01-05 MED ORDER — LORAZEPAM 1 MG PO TABS
1.0000 mg | ORAL_TABLET | Freq: Four times a day (QID) | ORAL | Status: AC
  Administered 2017-01-05 – 2017-01-06 (×4): 1 mg via ORAL
  Filled 2017-01-05 (×4): qty 1

## 2017-01-05 MED ORDER — PRENATAL 27-0.8 MG PO TABS
1.0000 | ORAL_TABLET | Freq: Every morning | ORAL | Status: DC
  Filled 2017-01-05 (×2): qty 1

## 2017-01-05 MED ORDER — ONDANSETRON 4 MG PO TBDP
4.0000 mg | ORAL_TABLET | Freq: Four times a day (QID) | ORAL | Status: DC | PRN
  Administered 2017-01-05 (×2): 4 mg via ORAL
  Filled 2017-01-05 (×3): qty 1

## 2017-01-05 MED ORDER — LORAZEPAM 1 MG PO TABS: 1.0000 mg | ORAL_TABLET | Freq: Every day | ORAL | Status: DC

## 2017-01-05 MED ORDER — OLANZAPINE 5 MG PO TABS
5.0000 mg | ORAL_TABLET | Freq: Every day | ORAL | Status: DC
  Administered 2017-01-05 – 2017-01-06 (×2): 5 mg via ORAL
  Filled 2017-01-05: qty 1
  Filled 2017-01-05: qty 2
  Filled 2017-01-05 (×2): qty 1

## 2017-01-05 MED ORDER — ACETAMINOPHEN 325 MG PO TABS
650.0000 mg | ORAL_TABLET | Freq: Four times a day (QID) | ORAL | Status: DC | PRN
  Administered 2017-01-05: 650 mg via ORAL
  Filled 2017-01-05: qty 2

## 2017-01-05 MED ORDER — HYDROXYZINE HCL 50 MG PO TABS
50.0000 mg | ORAL_TABLET | Freq: Every evening | ORAL | Status: DC | PRN
  Administered 2017-01-05: 50 mg via ORAL
  Filled 2017-01-05: qty 1

## 2017-01-05 MED ORDER — LORAZEPAM 1 MG PO TABS
1.0000 mg | ORAL_TABLET | Freq: Three times a day (TID) | ORAL | Status: AC
  Administered 2017-01-06 (×3): 1 mg via ORAL
  Filled 2017-01-05 (×3): qty 1

## 2017-01-05 MED ORDER — LORAZEPAM 1 MG PO TABS: 1.0000 mg | ORAL_TABLET | Freq: Two times a day (BID) | ORAL | Status: DC

## 2017-01-05 MED ORDER — ADULT MULTIVITAMIN W/MINERALS CH
1.0000 | ORAL_TABLET | Freq: Every day | ORAL | Status: DC
  Administered 2017-01-05: 1 via ORAL
  Filled 2017-01-05 (×3): qty 1

## 2017-01-05 MED ORDER — THIAMINE HCL 100 MG/ML IJ SOLN: 100.0000 mg | Freq: Once | INTRAMUSCULAR | Status: DC

## 2017-01-05 MED ORDER — ALUM & MAG HYDROXIDE-SIMETH 200-200-20 MG/5ML PO SUSP: 30.0000 mL | ORAL | Status: DC | PRN

## 2017-01-05 MED ORDER — MAGNESIUM HYDROXIDE 400 MG/5ML PO SUSP
30.0000 mL | Freq: Every day | ORAL | Status: DC | PRN
  Filled 2017-01-05: qty 30

## 2017-01-05 MED ORDER — OLANZAPINE 2.5 MG PO TABS
2.5000 mg | ORAL_TABLET | Freq: Two times a day (BID) | ORAL | Status: DC | PRN
  Filled 2017-01-05: qty 1

## 2017-01-05 MED ORDER — FOLIC ACID 1 MG PO TABS
1.0000 mg | ORAL_TABLET | Freq: Every day | ORAL | Status: DC
  Filled 2017-01-05 (×2): qty 1

## 2017-01-05 MED ORDER — DIPHENHYDRAMINE HCL 25 MG PO CAPS
25.0000 mg | ORAL_CAPSULE | Freq: Four times a day (QID) | ORAL | Status: DC | PRN
  Administered 2017-01-06 – 2017-01-07 (×2): 25 mg via ORAL
  Filled 2017-01-05 (×2): qty 1

## 2017-01-05 MED ORDER — VITAMIN B-1 100 MG PO TABS
100.0000 mg | ORAL_TABLET | Freq: Every day | ORAL | Status: DC
  Administered 2017-01-06 – 2017-01-07 (×2): 100 mg via ORAL
  Filled 2017-01-05 (×3): qty 1

## 2017-01-05 MED ORDER — LORAZEPAM 1 MG PO TABS
1.0000 mg | ORAL_TABLET | Freq: Four times a day (QID) | ORAL | Status: DC | PRN
  Administered 2017-01-05: 1 mg via ORAL
  Filled 2017-01-05: qty 1

## 2017-01-05 MED ORDER — ONDANSETRON HCL 4 MG PO TABS: 4.0000 mg | ORAL_TABLET | Freq: Three times a day (TID) | ORAL | Status: DC | PRN

## 2017-01-05 MED ORDER — ACETAMINOPHEN 325 MG PO TABS: 650.0000 mg | ORAL_TABLET | ORAL | Status: DC | PRN

## 2017-01-05 MED ORDER — HYDROXYZINE HCL 25 MG PO TABS: 25.0000 mg | ORAL_TABLET | Freq: Four times a day (QID) | ORAL | Status: DC | PRN

## 2017-01-05 MED ORDER — PRENATAL MULTIVITAMIN CH
1.0000 | ORAL_TABLET | Freq: Every day | ORAL | Status: DC
  Administered 2017-01-05 – 2017-01-07 (×3): 1 via ORAL
  Filled 2017-01-05 (×4): qty 1
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
## 2017-01-05 NOTE — Tx Team (Signed)
Initial Treatment Plan 01/05/2017 1:44 AM Susan FillAudrey W [name] WUJ:[id number]    PATIENT STRESSORS: Marital or family conflict Medication change or noncompliance Substance abuse   PATIENT STRENGTHS: Ability for insight Average or above average intelligence Capable of independent living General fund of knowledge Motivation for treatment/growth   PATIENT IDENTIFIED PROBLEMS: Depression Suicidal thoughts Substance Abuse "Help with mood swings, depression and real bad anxiety"                     DISCHARGE CRITERIA:  Ability to meet basic life and health needs Improved stabilization in mood, thinking, and/or behavior Verbal commitment to aftercare and medication compliance Withdrawal symptoms are absent or subacute and managed without 24-hour nursing intervention  PRELIMINARY DISCHARGE PLAN: Attend aftercare/continuing care group Return to previous living arrangement  PATIENT/FAMILY INVOLVEMENT: This treatment plan has been presented to and reviewed with the patient, Susan [name], and/or family member, .  The patient and family have been given the opportunity to ask questions and make suggestions.  Wil Slape, [place], [place]RN 01/05/2017, 1:44 AM

## 2017-01-05 NOTE — [facility] Group Notes (Signed)
[facility] LCSW Group Therapy  01/05/2017 1:15 pm  Type of Therapy: Process Group Therapy  Participation Level:  Active  Participation Quality:  Appropriate  Affect:  Flat  Cognitive:  Oriented  Insight:  Improving  Engagement in Group:  Limited  Engagement in Therapy:  Limited  Modes of Intervention:  Activity, Clarification, Education, Problem-solving and Support  Summary of Progress/Problems: Today's group addressed the issue of overcoming obstacles.  Patients were asked to identify their biggest obstacle post d/c that stands in the way of their on-going success, and then problem solve as to how to manage this. Invited.  Chose to not attend.  Ida Rogueorth, Sasha Rueth B 01/05/2017   4:38 PM

## 2017-01-05 NOTE — Progress Notes (Signed)
Adult Psychoeducational Group Note  Date:  01/05/2017 Time:  8:50 PM  Group Topic/Focus:  Wrap-Up Group:   The focus of this group is to help patients review their daily goal of treatment and discuss progress on daily workbooks.  Participation Level:  Active  Participation Quality:  Appropriate  Affect:  Appropriate  Cognitive:  Alert  Insight: Appropriate  Engagement in Group:  Engaged  Modes of Intervention:  Discussion  Additional Comments:  Patient's goal for today was to get up and attend group.   Consuelo Thayne L Tailyn Hantz 01/05/2017, 8:50 PM

## 2017-01-05 NOTE — Progress Notes (Signed)
41 year old female pt admitted on voluntary basis. Susan [name] reports that a friend brought her to the hospital earlier in the day because she has been feeling depressed and suicidal. Susan [name] reports that she has been off all her medications since finding out she was pregnant back in April but reports she has been drinking on a daily basis and using drugs. She reports that she is here because she wants help and wants to get on some medications to help her with her mood swings and depression. She reports that she lives with her mother and her 4 children and plans to go back there after discharge. She does endorse passive SI on admission but able to contract for safety while in the hospital. Susan [name] does not display any overt signs or symptoms of alcohol withdrawal but does report anxiety on admission. Susan [name] does not verbalize any complaints of pain, was oriented to the unit and safety maintained.
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
## 2017-01-05 NOTE — Progress Notes (Signed)
Recreation Therapy Notes  Date: 01/05/17 Time: 1000 Location: 500 Hall Dayroom  Group Topic: Wellness  Goal Area(s) Addresses:  Patient will define components of whole wellness. Patient will verbalize benefit of whole wellness.  Intervention:  Chairs, beach ball  Activity:  Keep It Going Volleyball.  Patients were seated in a circle.  Patients were to pass the ball back and forth to each other.  LRT would count the number of hits the patients got on the ball.  Patients could bounce the ball off of the floor but the ball could not come to a complete stop.  If the ball stopped the count would start from the beginning.   Education: Wellness, Discharge Planning.   Education Outcome: Acknowledges education/In group clarification offered/Needs additional education.   Clinical Observations/Feedback: Pt did not attend group.   Kellyn [place], LRT/CTRS         Michaeleen Down A 01/05/2017 12:10 PM 

## 2017-01-05 NOTE — [facility] Suicide Risk Assessment (Signed)
[facility]   Nursing information obtained from:    Demographic factors:    Current Mental Status:    Loss Factors:    Historical Factors:    Risk Reduction Factors:     Total Time spent with patient: 30 minutes Principal Problem: MDD (major depressive disorder), single episode, severe with psychotic features ([facility]) Diagnosis:   Patient Active Problem List   Diagnosis Date Noted  . MDD (major depressive disorder), single episode, severe with psychotic features ([facility]) [F32.3] 01/05/2017  . Alcohol use disorder, severe, dependence ([facility]) [F10.20] 01/05/2017  . Cocaine use disorder, moderate, dependence ([facility]) [F14.20] 01/05/2017  . Cannabis use disorder, moderate, dependence ([facility]) [F12.20] 01/05/2017  . Heavy smoker [F17.200] 02/03/2016  . Cocaine use [F14.90] 02/03/2016  . Chronic, continuous use of opioids [F11.90] 03/24/2015  . Depression with anxiety [F41.8] 03/24/2015  . Poor dentition [K08.9] 03/24/2015  . CLOSED FRACTURE DISTAL PHALANX OR PHALANGES HAND [S62.639A] 06/05/2009  . Sprain of ankle, unspecified site [S93.409A] 05/28/2009   Subjective Data: Please see H&P.   Continued Clinical Symptoms:  Alcohol Use Disorder Identification Test Final Score (AUDIT): 27 The "Alcohol Use Disorders Identification Test", Guidelines for Use in Primary Care, Second Edition.  World Science writerHealth Organization [facility](WHO). Score between 0-7:  no or low risk or alcohol related problems. Score between 8-15:  moderate risk of alcohol related problems. Score between 16-19:  high risk of alcohol related problems. Score 20 or above:  warrants further diagnostic evaluation for alcohol dependence and treatment.   CLINICAL FACTORS:   Alcohol/Substance Abuse/Dependencies Previous Psychiatric Diagnoses and Treatments   Musculoskeletal: Strength & Muscle Tone: within normal limits Gait & Station: normal Patient leans: N/A  Psychiatric Specialty Exam: Physical Exam  ROS  Blood  pressure (!) 93/47, pulse 62, temperature 98.9 F (37.2 C), temperature source Oral, resp. rate 16, height 5\' 1"  (1.549 m), weight 65.8 kg (145 lb), unknown if currently breastfeeding.Body mass index is 27.4 kg/m.                                      Please see H&P.                     COGNITIVE FEATURES THAT CONTRIBUTE TO RISK:  Closed-mindedness, Polarized thinking and Thought constriction (tunnel vision)    SUICIDE RISK:   Moderate:  Frequent suicidal ideation with limited intensity, and duration, some specificity in terms of plans, no associated intent, good self-control, limited dysphoria/symptomatology, some risk factors present, and identifiable protective factors, including available and accessible social support.  PLAN OF CARE: Please see H&P.   I certify that inpatient services furnished can reasonably be expected to improve the patient's condition.   Ciclaly Mulcahey, MD 01/05/2017, 1:03 PM

## 2017-01-05 NOTE — Progress Notes (Signed)
Patient denies SI, HI and AVH.  Patient reported increased anxiety and complained of a white malodorous discharge from her vagina.  Patient has been compliant with medications and attended groups.  Patient has been no behavioral dyscontrol.   Assess patient for safety, offer medications as prescribed, engage patient in 1:1 staff talks.   Continue to monitor as planned, patient able to contract for safety.
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
## 2017-01-05 NOTE — [facility] Suicide Risk Assessment (Signed)
[facility] INPATIENT:  Family/Significant Other Suicide Prevention Education  Suicide Prevention Education:  Contact Attempts: Danella SensingRhonda Brunet, mother, [phone], (name of family member/significant other) has been identified by the patient as the family member/significant other with whom the patient will be residing, and identified as the person(s) who will aid the patient in the event of a mental health crisis.  With written consent from the patient, two attempts were made to provide suicide prevention education, prior to and/or following the patient's discharge.  We were unsuccessful in providing suicide prevention education.  A suicide education pamphlet was given to the patient to share with family/significant other.  Date and time of first attempt: 01/05/17 at 2:30 PM; VM left requesting return call   Sallee Langenne C Sladen Plancarte 01/05/2017, 2:41 PM

## 2017-01-05 NOTE — [facility] Counselor (Signed)
PSA attempt w patient, patient declined assessment stating she did not feel well enough to talk to CSW.  Santa GeneraAnne Cunningham, LCSW Lead Clinical Social Worker Phone:  [phone]

## 2017-01-06 ENCOUNTER — Ambulatory Visit (HOSPITAL_COMMUNITY): Payer: Medicaid Other

## 2017-01-06 DIAGNOSIS — F17210 Nicotine dependence, cigarettes, uncomplicated: Secondary | ICD-10-CM

## 2017-01-06 LAB — LIPID PANEL
Cholesterol: 125 mg/dL (ref 0–200)
HDL: 65 mg/dL (ref >40)
LDL Cholesterol: 23 mg/dL (ref 0–99)
Total CHOL/HDL Ratio: 1.9 RATIO
Triglycerides: 185 mg/dL — ABNORMAL HIGH (ref <150)
VLDL: 37 mg/dL (ref 0–40)

## 2017-01-06 LAB — COMPREHENSIVE METABOLIC PANEL
ALT: 13 U/L — ABNORMAL LOW (ref 14–54)
AST: 17 U/L (ref 15–41)
Albumin: 3.5 g/dL (ref 3.5–5.0)
Alkaline Phosphatase: 49 U/L (ref 38–126)
Anion gap: 8 (ref 5–15)
BUN: 7 mg/dL (ref 6–20)
CO2: 22 mmol/L (ref 22–32)
Calcium: 8.6 mg/dL — ABNORMAL LOW (ref 8.9–10.3)
Chloride: 105 mmol/L (ref 101–111)
Creatinine, Ser: 0.46 mg/dL (ref 0.44–1.00)
GFR calc Af Amer: >60 mL/min (ref >60)
GFR calc non Af Amer: >60 mL/min (ref >60)
Glucose, Bld: 118 mg/dL — ABNORMAL HIGH (ref 65–99)
Potassium: 3.8 mmol/L (ref 3.5–5.1)
Sodium: 135 mmol/L (ref 135–145)
Total Bilirubin: 0.1 mg/dL — ABNORMAL LOW (ref 0.3–1.2)
Total Protein: 6.4 g/dL — ABNORMAL LOW (ref 6.5–8.1)

## 2017-01-06 LAB — TSH: TSH: 2.463 u[IU]/mL (ref 0.350–4.500)

## 2017-01-06 MED ORDER — FLUOXETINE HCL 10 MG PO CAPS
10.0000 mg | ORAL_CAPSULE | Freq: Every day | ORAL | Status: DC
Start: 2017-01-06 — End: 2017-01-07
  Administered 2017-01-06 – 2017-01-07 (×2): 10 mg via ORAL
  Filled 2017-01-06 (×3): qty 1

## 2017-01-06 MED ORDER — MICONAZOLE NITRATE 2 % VA CREA
1.0000 | TOPICAL_CREAM | Freq: Every day | VAGINAL | Status: DC
  Administered 2017-01-06: 1 via VAGINAL
  Filled 2017-01-06: qty 45

## 2017-01-06 NOTE — Tx Team (Signed)
Interdisciplinary Treatment and Diagnostic Plan Update  01/06/2017 Time of Session: 11:09 AM  Susan [name] MRN: [id number]  Principal Diagnosis: MDD (major depressive disorder), single episode, severe with psychotic features ([place])  Secondary Diagnoses: Principal Problem:   MDD (major depressive disorder), single episode, severe with psychotic features ([place]) Active Problems:   Alcohol use disorder, severe, dependence ([place])   Cocaine use disorder, moderate, dependence ([place])   Cannabis use disorder, moderate, dependence ([place])   Pregnancy   Current Medications:  Current Facility-Administered Medications  Medication Dose Route Frequency Provider Last Rate Last Dose  . acetaminophen (TYLENOL) tablet 650 mg  650 mg Oral Q6H PRN Laverle Hobby, PA-C   650 mg at 01/05/17 2213  . alum & mag hydroxide-simeth (MAALOX/MYLANTA) 200-200-20 MG/5ML suspension 30 mL  30 mL Oral Q4H PRN Patriciaann Clan E, PA-C      . diphenhydrAMINE (BENADRYL) capsule 25 mg  25 mg Oral Q6H PRN Eappen, Saramma, MD      . loperamide (IMODIUM) capsule 2-4 mg  2-4 mg Oral PRN Laverle Hobby, PA-C      . LORazepam (ATIVAN) tablet 1 mg  1 mg Oral Q6H PRN Laverle Hobby, PA-C   1 mg at 01/05/17 2637  . LORazepam (ATIVAN) tablet 1 mg  1 mg Oral TID Patriciaann Clan E, PA-C   1 mg at 01/06/17 0736   Followed by  . [START ON 01/07/2017] LORazepam (ATIVAN) tablet 1 mg  1 mg Oral BID Patriciaann Clan E, PA-C       Followed by  . [START ON 01/08/2017] LORazepam (ATIVAN) tablet 1 mg  1 mg Oral Daily Simon, Spencer E, PA-C      . magnesium hydroxide (MILK OF MAGNESIA) suspension 30 mL  30 mL Oral Daily PRN Patriciaann Clan E, PA-C      . OLANZapine (ZYPREXA) tablet 2.5 mg  2.5 mg Oral BID PRN Ursula Alert, MD      . OLANZapine (ZYPREXA) tablet 5 mg  5 mg Oral QHS Eappen, Ria Clock, MD   5 mg at 01/05/17 2119  . ondansetron (ZOFRAN) tablet 4 mg  4 mg Oral Q8H PRN Okonkwo, Justina A, NP      . ondansetron (ZOFRAN-ODT) disintegrating  tablet 4 mg  4 mg Oral Q6H PRN Patriciaann Clan E, PA-C   4 mg at 01/05/17 2142  . prenatal multivitamin tablet 1 tablet  1 tablet Oral Daily Ursula Alert, MD   1 tablet at 01/06/17 [phone]  . thiamine (B-1) injection 100 mg  100 mg Intramuscular Once Patriciaann Clan E, PA-C      . thiamine (VITAMIN B-1) tablet 100 mg  100 mg Oral Daily Patriciaann Clan E, PA-C   100 mg at 01/06/17 5027    PTA Medications: No prescriptions prior to admission.    Treatment Modalities: Medication Management, Group therapy, Case management,  1 to 1 session with clinician, Psychoeducation, Recreational therapy.   Physician Treatment Plan for Primary Diagnosis: MDD (major depressive disorder), single episode, severe with psychotic features ([place]) Long Term Goal(s): Improvement in symptoms so as ready for discharge  Short Term Goals: Compliance with prescribed medications will improve   Medication Management: Evaluate patient's response, side effects, and tolerance of medication regimen.  Therapeutic Interventions: 1 to 1 sessions, Unit Group sessions and Medication administration.  Evaluation of Outcomes: Progressing  Physician Treatment Plan for Secondary Diagnosis: Principal Problem:   MDD (major depressive disorder), single episode, severe with psychotic features ([place]) Active Problems:   Alcohol use disorder,  severe, dependence ([facility])   Cocaine use disorder, moderate, dependence ([facility])   Cannabis use disorder, moderate, dependence ([place])   Pregnancy   Long Term Goal(s): Improvement in symptoms so as ready for discharge  Short Term Goals: Ability to identify triggers associated with substance abuse/mental health issues will improve  Medication Management: Evaluate patient's response, side effects, and tolerance of medication regimen.  Therapeutic Interventions: 1 to 1 sessions, Unit Group sessions and Medication administration.  Evaluation of Outcomes: Progressing   RN Treatment Plan for Primary  Diagnosis: MDD (major depressive disorder), single episode, severe with psychotic features ([place]) Long Term Goal(s): Knowledge of disease and therapeutic regimen to maintain health will improve  Short Term Goals: Ability to verbalize feelings will improve and Ability to identify and develop effective coping behaviors will improve  Medication Management: RN will administer medications as ordered by provider, will assess and evaluate patient's response and provide education to patient for prescribed medication. RN will report any adverse and/or side effects to prescribing provider.  Therapeutic Interventions: 1 on 1 counseling sessions, Psychoeducation, Medication administration, Evaluate responses to treatment, Monitor vital signs and CBGs as ordered, Perform/monitor CIWA, COWS, AIMS and Fall Risk screenings as ordered, Perform wound care treatments as ordered.  Evaluation of Outcomes: Progressing    Recreational Therapy Treatment Plan for Primary Diagnosis: MDD (major depressive disorder), single episode, severe with psychotic features ([place]) Long Term Goal(s): Patient will participate in recreation therapy treatment in at least 2 group sessions without prompting from LRT  Short Term Goals: Patient will be able to identify at least 5 coping skills for admitting diagnosis by conclusion of recreation therapy treatment  Treatment Modalities: Group and Pet Therapy  Therapeutic Interventions: Psychoeducation  Evaluation of Outcomes: Progressing   LCSW Treatment Plan for Primary Diagnosis: MDD (major depressive disorder), single episode, severe with psychotic features ([place]) Long Term Goal(s): Safe transition to appropriate next level of care at discharge, Engage patient in therapeutic group addressing interpersonal concerns.  Short Term Goals: Engage patient in aftercare planning with referrals and resources  Therapeutic Interventions: Assess for all discharge needs, 1 to 1 time with Social  worker, Explore available resources and support systems, Assess for adequacy in community support network, Educate family and significant other(s) on suicide prevention, Complete Psychosocial Assessment, Interpersonal group therapy.  Evaluation of Outcomes: Met  Return home, follow up [place] in Treatment: Attending groups:Yes Participating in groups: Yes Taking medication as prescribed: Yes Toleration medication: Yes, no side effects reported at this time Family/Significant other contact made: Yes Patient understands diagnosis: Yes AEB asking for help with depression and stopping drinking andfdrugs during pregnancy Discussing patient identified problems/goals with staff: Yes Medical problems stabilized or resolved: Yes Denies suicidal/homicidal ideation: Yes Issues/concerns per patient self-inventory: None Other: N/A  New problem(s) identified: None identified at this time.   New Short Term/Long Term Goal(s): None identified at this time.   Discharge Plan or Barriers:   Reason for Continuation of Hospitalization:  Depression Medication stabilization Withdrawal symptoms  Estimated Length of Stay: 3-5 days  Attendees: Patient: 01/06/2017  11:09 AM  Physician: Ursula Alert, MD 01/06/2017  11:09 AM  Nursing: Elesa Massed, RN 01/06/2017  11:09 AM  RN Care Manager: Lars Pinks, RN 01/06/2017  11:09 AM  Social Worker: Ripley Fraise 01/06/2017  11:09 AM  Recreational Therapist: Victorino Sparrow, LRT/CTRS 01/06/2017  11:09 AM  Other: Norberto Sorenson 01/06/2017  11:09 AM  Other:  01/06/2017  11:09 AM    Scribe for Treatment Team:  [place] LCSW 01/06/2017 11:09 AM

## 2017-01-06 NOTE — Progress Notes (Signed)
[facility] MD Progress Note  01/06/2017 12:19 PM Susan [name]  MRN:  [id number]   Subjective:  "I'm depressed and having suicidal thoughts and has multiple behaviors like trying to hang myself 2 weeks ago and also tried on with the cigarettes and also endorses self medicating with cocaine and alcohol."  Objective: Patient seen, chart reviewed and case discussed with the treatment team. Patient is 2 and half months gestation and has no known perinatal care, and also exposing alcohol and cocaine. Patient continued to endorse symptoms of depression, anxiety, lack of motivation, isolation, poor attention span and self-medicating with a drug of abuse including alcohol and cocaine. Patient rated her depression as 8 out of 10 and poor energy. After discussing the risk and benefits of the antipsychotic medications and her antidepressant medication sheet dated to take medication Zyprexa and also fluoxetine for controlling her depression and mood swings.Patient reported she has no current outpatient psychiatric treatment. Patient lives with her mother who actually suffering with COPD.   Principal Problem: MDD (major depressive disorder), single episode, severe with psychotic features ([place]) Diagnosis:   Patient Active Problem List   Diagnosis Date Noted  . MDD (major depressive disorder), single episode, severe with psychotic features ([place]) [F32.3] 01/05/2017  . Alcohol use disorder, severe, dependence ([place]) [F10.20] 01/05/2017  . Cocaine use disorder, moderate, dependence ([place]) [F14.20] 01/05/2017  . Cannabis use disorder, moderate, dependence ([place]) [F12.20] 01/05/2017  . Pregnancy [Z34.90] 01/05/2017  . Heavy smoker [F17.200] 02/03/2016  . Cocaine use [F14.90] 02/03/2016  . Chronic, continuous use of opioids [F11.90] 03/24/2015  . Depression with anxiety [F41.8] 03/24/2015  . Poor dentition [K08.9] 03/24/2015  . CLOSED FRACTURE DISTAL PHALANX OR PHALANGES HAND [S62.639A] 06/05/2009  . Sprain of ankle,  unspecified site [S93.409A] 05/28/2009   Total Time spent with patient: 30 minutes  Past Psychiatric History: Patient has been suffering with chronic, recurrent major depressive disorder, alcohol use disorder, cocaine use disorder and cannabis use disorder.Patient has been noncompliant with outpatient medication management at [place], [place]  Past Medical History:  Past Medical History:  Diagnosis Date  . Alcohol abuse   . Asthma   . BV (bacterial vaginosis) 03/06/2013  . Chronic dental pain   . Chronic pelvic pain in female   . Depression   . Ectopic pregnancy   . Panic attacks   . Polysubstance abuse    cocaine, opiates, marijuana  . Trichomonas vaginitis     Past Surgical History:  Procedure Laterality Date  . ECTOPIC PREGNANCY SURGERY     Family History:  Family History  Problem Relation Age of Onset  . COPD Mother   . Stroke Mother   . Heart disease Maternal Grandmother   . Hypertension Maternal Grandmother   . Stroke Maternal Grandfather   . Asthma Sister   . Down syndrome Sister    Family Psychiatric  History: Unknown Social History:  History  Alcohol Use  . Yes    Comment: occ     History  Drug Use  . Types: Marijuana, Cocaine    Comment: 1 week ago cocaine    Social History   Social History  . Marital status: Single    Spouse name: N/A  . Number of children: N/A  . Years of education: N/A   Social History Main Topics  . Smoking status: Current Every Day Smoker    Packs/day: 1.00    Types: Cigarettes  . Smokeless tobacco: Never Used  . Alcohol use Yes  Comment: occ  . Drug use: Yes    Types: Marijuana, Cocaine     Comment: 1 week ago cocaine  . Sexual activity: Yes    Birth control/ protection: None     Comment: vasectomy   Other Topics Concern  . None   Social History Narrative  . None   Additional Social History:                         Sleep: Poor  Appetite:  Fair  Current Medications: Current  Facility-Administered Medications  Medication Dose Route Frequency Provider Last Rate Last Dose  . acetaminophen (TYLENOL) tablet 650 mg  650 mg Oral Q6H PRN Laverle Hobby, PA-C   650 mg at 01/05/17 2213  . alum & mag hydroxide-simeth (MAALOX/MYLANTA) 200-200-20 MG/5ML suspension 30 mL  30 mL Oral Q4H PRN Patriciaann Clan E, PA-C      . diphenhydrAMINE (BENADRYL) capsule 25 mg  25 mg Oral Q6H PRN Eappen, Saramma, MD      . FLUoxetine (PROZAC) capsule 10 mg  10 mg Oral Daily Savera Donson, MD      . loperamide (IMODIUM) capsule 2-4 mg  2-4 mg Oral PRN Laverle Hobby, PA-C      . LORazepam (ATIVAN) tablet 1 mg  1 mg Oral Q6H PRN Laverle Hobby, PA-C   1 mg at 01/05/17 5027  . LORazepam (ATIVAN) tablet 1 mg  1 mg Oral TID Patriciaann Clan E, PA-C   1 mg at 01/06/17 1200   Followed by  . [START ON 01/07/2017] LORazepam (ATIVAN) tablet 1 mg  1 mg Oral BID Patriciaann Clan E, PA-C       Followed by  . [START ON 01/08/2017] LORazepam (ATIVAN) tablet 1 mg  1 mg Oral Daily Simon, Spencer E, PA-C      . magnesium hydroxide (MILK OF MAGNESIA) suspension 30 mL  30 mL Oral Daily PRN Patriciaann Clan E, PA-C      . OLANZapine (ZYPREXA) tablet 2.5 mg  2.5 mg Oral BID PRN Ursula Alert, MD      . OLANZapine (ZYPREXA) tablet 5 mg  5 mg Oral QHS Eappen, Ria Clock, MD   5 mg at 01/05/17 2119  . ondansetron (ZOFRAN) tablet 4 mg  4 mg Oral Q8H PRN Okonkwo, Justina A, NP      . ondansetron (ZOFRAN-ODT) disintegrating tablet 4 mg  4 mg Oral Q6H PRN Patriciaann Clan E, PA-C   4 mg at 01/05/17 2142  . prenatal multivitamin tablet 1 tablet  1 tablet Oral Daily Ursula Alert, MD   1 tablet at 01/06/17 [phone]  . thiamine (B-1) injection 100 mg  100 mg Intramuscular Once Patriciaann Clan E, PA-C      . thiamine (VITAMIN B-1) tablet 100 mg  100 mg Oral Daily Laverle Hobby, PA-C   100 mg at 01/06/17 8786    Lab Results:  Results for orders placed or performed during the hospital encounter of 01/05/17 (from the past 48  hour(s))  TSH     Status: None   Collection Time: 01/06/17  6:29 AM  Result Value Ref Range   TSH 2.463 0.350 - 4.500 uIU/mL    Comment: Performed by a 3rd Generation assay with a functional sensitivity of <=0.01 uIU/mL. Performed at [facility], [place] [address] [place], [place] [address]   Lipid panel     Status: Abnormal   Collection Time: 01/06/17  6:29 AM  Result Value  Ref Range   Cholesterol 125 0 - 200 mg/dL   Triglycerides 185 (H) <150 mg/dL   HDL 65 >40 mg/dL   Total CHOL/HDL Ratio 1.9 RATIO   VLDL 37 0 - 40 mg/dL   LDL Cholesterol 23 0 - 99 mg/dL    Comment:        Total Cholesterol/HDL:CHD Risk Coronary Heart Disease Risk Table                     Men   Women  1/2 Average Risk   3.4   3.3  Average Risk       5.0   4.4  2 X Average Risk   9.6   7.1  3 X Average Risk  23.4   11.0        Use the calculated Patient Ratio above and the CHD Risk Table to determine the patient's CHD Risk.        ATP III CLASSIFICATION (LDL):  <100     mg/dL   Optimal  100-129  mg/dL   Near or Above                    Optimal  130-159  mg/dL   Borderline  160-189  mg/dL   High  >190     mg/dL   Very High Performed at [facility] [address] [place], [place] [address]   Comprehensive metabolic panel     Status: Abnormal   Collection Time: 01/06/17  6:29 AM  Result Value Ref Range   Sodium 135 135 - 145 mmol/L   Potassium 3.8 3.5 - 5.1 mmol/L   Chloride 105 101 - 111 mmol/L   CO2 22 22 - 32 mmol/L   Glucose, Bld 118 (H) 65 - 99 mg/dL   BUN 7 6 - 20 mg/dL   Creatinine, Ser 0.46 0.44 - 1.00 mg/dL   Calcium 8.6 (L) 8.9 - 10.3 mg/dL   Total Protein 6.4 (L) 6.5 - 8.1 g/dL   Albumin 3.5 3.5 - 5.0 g/dL   AST 17 15 - 41 U/L   ALT 13 (L) 14 - 54 U/L   Alkaline Phosphatase 49 38 - 126 U/L   Total Bilirubin 0.1 (L) 0.3 - 1.2 mg/dL   GFR calc non Af Amer >60 >60 mL/min   GFR calc Af Amer >60 >60 mL/min    Comment: (NOTE) The eGFR has been calculated using  the CKD EPI equation. This calculation has not been validated in all clinical situations. eGFR's persistently <60 mL/min signify possible Chronic Kidney Disease.    Anion gap 8 5 - 15    Comment: Performed at [facility], [place] [address], [place], [place] [address]    Blood Alcohol level:  Lab Results  Component Value Date   ETH 152 (H) 01/04/2017   ETH 341 (HH) 56/43/3295    Metabolic Disorder Labs: No results found for: HGBA1C, MPG No results found for: PROLACTIN Lab Results  Component Value Date   CHOL 125 01/06/2017   TRIG 185 (H) 01/06/2017   HDL 65 01/06/2017   CHOLHDL 1.9 01/06/2017   VLDL 37 01/06/2017   LDLCALC 23 01/06/2017    Physical Findings: AIMS: Facial and Oral Movements Muscles of Facial Expression: None, normal Lips and Perioral Area: None, normal Jaw: None, normal Tongue: None, normal,Extremity Movements Upper (arms, wrists, hands, fingers): None, normal Lower (legs, knees, ankles, toes): None, normal, Trunk Movements Neck, shoulders, hips: None,  normal, Overall Severity Severity of abnormal movements (highest score from questions above): None, normal Incapacitation due to abnormal movements: None, normal Patient's awareness of abnormal movements (rate only patient's report): No Awareness, Dental Status Current problems with teeth and/or dentures?: Yes Does patient usually wear dentures?: No  CIWA:  CIWA-Ar Total: 0 COWS:     Musculoskeletal: Strength & Muscle Tone: within normal limits Gait & Station: normal Patient leans: N/A  Psychiatric Specialty Exam: Physical Exam  ROS  Blood pressure (!) 105/50, pulse 69, temperature 98.8 F (37.1 C), temperature source Oral, resp. rate 16, height 5' 1"  (1.549 m), weight 65.8 kg (145 lb), unknown if currently breastfeeding.Body mass index is 27.4 kg/m.  General Appearance: Bizarre and Guarded  Eye Contact:  Good  Speech:  Clear and Coherent and Slow  Volume:  Decreased  Mood:   Anxious and Depressed  Affect:  Constricted and Depressed  Thought Process:  Coherent and Goal Directed  Orientation:  Full (Time, Place, and Person)  Thought Content:  Rumination  Suicidal Thoughts:  Yes.  with intent/plan  Homicidal Thoughts:  No  Memory:  Immediate;   Fair Recent;   Fair Remote;   Fair  Judgement:  Impaired  Insight:  Shallow  Psychomotor Activity:  Decreased  Concentration:  Concentration: Fair and Attention Span: Fair  Recall:  Good  Fund of Knowledge:  Fair  Language:  Good  Akathisia:  Negative  Handed:  Right  AIMS (if indicated):     Assets:  Communication Skills Desire for Improvement Financial Resources/Insurance Housing Leisure Time Physical Health Resilience Social Support Talents/Skills  ADL's:  Intact  Cognition:  WNL  Sleep:  Number of Hours: 5.75     Treatment Plan Summary: Patient in her first trimester of pregnancy , presented with worsening depression and SI , will start treatment , consult OBGYN to coordinate care.  Daily contact with patient to assess and evaluate symptoms and progress in treatment, Medication management and Plan see below Estimated length of stay is 5-7 days.  Will Continue Zyprexa 5 mg PO qhs for psychosis/mood sx. Please start fluoxetine 10 mg daily for depression which can be titrated up to 20 mg as clinically required tolerated with patient informed consent. Will continue CIWA/Ativan protocol for alcohol withdrawal sx.  Consulted OBGYN - discussed to get US abdomen as well as prenatal vitamins. Follow up as outpatient.  Will continue to monitor vitals ,medication compliance and treatment side effects while patient is here.   Will monitor for medical issues as well as call consult as needed.  Reviewed labs cbc - wbc- elevated , will monitor , ca+ - low , will repeat , uds - pos for cocaine , bal 152 ,will order tsh, lipid panel, hba1c, pl as well as ekg for qtc monitoring.  CSW will start working on  disposition.  Patient to participate in therapeutic milieu .   Ambrose Finland, MD 01/06/2017, 12:19 PM

## 2017-01-06 NOTE — Progress Notes (Signed)
Pt attended karaoke group this evening.  

## 2017-01-06 NOTE — Plan of Care (Signed)
Problem: Safety: Goal: Periods of time without injury will increase Outcome: Progressing Q 15 minutes safety checks maintained without self harm gestures. Pt reports withdrawal symptoms of chills and sweats. No seizure activity noted thus far this shift.   Problem: Medication: Goal: Compliance with prescribed medication regimen will improve Pt has been medication compliant, denies adverse drug reaction at this time.

## 2017-01-06 NOTE — Progress Notes (Signed)
Recreation Therapy Notes  Date: 01/06/17 Time: 1000 Location: 500 Hall Dayroom  Group Topic: Anger Management  Goal Area(s) Addresses:  Patient will identify triggers for anger.  Patient will identify physical reaction to anger.   Patient will identify benefit of using coping skills when angry.  Behavioral Response: Engaged  Intervention: Worksheets, pencils  Activity: Umbrella of Emotion.  Patients were given a picture of an umbrella.  In the umbrella, patients were to identify underlying emotions to their anger.  If they were unable to name specific emotions that the anger is covering, they were to identify circumstances and situations that get them angry.  Education: Anger Management, Discharge Planning   Education Outcome: Acknowledges education/In group clarification offered/Needs additional education.   Clinical Observations/Feedback: Pt was late to group.  Pt shared not being heard, boyfriend/family issues and people talking smart to her gets her angry.  Some of the coping skills pt identified are "ignore them, walk away and swimming" as coping skills.  Pt stated using these coping skills will help her because she won't be stressed out and she can take the time to listen to them.   Caroll RancherMarjette Harrison [place], LRT/CTRS          Caroll RancherLindsay, Jaymison Luber A 01/06/2017 12:23 PM

## 2017-01-06 NOTE — Progress Notes (Signed)
  D: Pt appeared to be flat and depressed. Pt complained of "discharge and peeing a whole lot more": Stated the discharge was "white and had a smell". Also complained of discomfort when urinating. Pt has no other questions or concerns.   A: Writer informed extender and order was put in for culture.   Support and encouragement was offered. 15 min checks continued for safety.  R: Pt remains safe.

## 2017-01-06 NOTE — Progress Notes (Signed)
D: Pt A & O X 3. Denies HI, VH and pain. Endorsed passive SI and +AH "I still hear voices and sometimes I want to kill myself, I will let you know though". Presents with flat affect and depressed mood. Rates her depression 8/10 and anxiety 5/10. Cooperative with Ultrasound procedure this AM, result shows pt is at 13 weeks 2 days gestation.   A: Scheduled and PRN medications administered as prescribed, effects monitored. Consuello ClossAgnes N, NP notified of pt's c/o of vaginal d/c with odor. New order received for Monistat 1 app X7 days to start tonight. Urine sample obtained for culture as well. Pt informed of new orders. Emotional support and availability provided to pt. Encouraged to comply with treatment regimen.  Q 15 minutes safety checks maintained without self harm gestures or outburst thus far.  R: Pt calm, receptive to care. Compliant with medications. Denies adverse drug reactions. Attends scheduled unit groups. Remains safe on and off unit.

## 2017-01-06 NOTE — [facility] Group Notes (Signed)
[facility]  01/06/2017 , 11:13 AM    Type of Therapy:  [facility] Presentation  Participation Level:  Active  Participation Quality:  Attentive  Affect:  Blunted  Cognitive:  Oriented  Insight:  Limited  Engagement in Therapy:  Engaged  Modes of Intervention:  Discussion, Education and Socialization  Summary of Progress/Problems:  Susan [name] from [facility] came to present his recovery story and play the guitar.  Stayed the entire time, slept throughout.  Susan [name], Susan [name] B 01/06/2017 , 11:13 AM

## 2017-01-07 ENCOUNTER — Inpatient Hospital Stay (HOSPITAL_COMMUNITY)
Admission: AD | Admit: 2017-01-07 | Discharge: 2017-01-07 | Disposition: A | Discharge status: Still In Hospital | Payer: Medicaid Other | Attending: Obstetrics and Gynecology | Admitting: Obstetrics and Gynecology

## 2017-01-07 ENCOUNTER — Inpatient Hospital Stay (HOSPITAL_COMMUNITY)
Admission: AD | Admit: 2017-01-07 | Discharge: 2017-01-10 | Disposition: A | Discharge status: Home / Self Care | Payer: Medicaid Other | Source: Intra-hospital | Attending: Psychiatry | Admitting: Psychiatry

## 2017-01-07 ENCOUNTER — Encounter (HOSPITAL_COMMUNITY): Payer: Self-pay | Admitting: Advanced Practice Midwife

## 2017-01-07 DIAGNOSIS — Z3490 Encounter for supervision of normal pregnancy, unspecified, unspecified trimester: Secondary | ICD-10-CM

## 2017-01-07 DIAGNOSIS — F1220 Cannabis dependence, uncomplicated: Secondary | ICD-10-CM

## 2017-01-07 DIAGNOSIS — F332 Major depressive disorder, recurrent severe without psychotic features: Secondary | ICD-10-CM | POA: Diagnosis present

## 2017-01-07 DIAGNOSIS — F323 Major depressive disorder, single episode, severe with psychotic features: Secondary | ICD-10-CM | POA: Diagnosis present

## 2017-01-07 DIAGNOSIS — F1020 Alcohol dependence, uncomplicated: Secondary | ICD-10-CM

## 2017-01-07 DIAGNOSIS — F1420 Cocaine dependence, uncomplicated: Secondary | ICD-10-CM | POA: Diagnosis present

## 2017-01-07 DIAGNOSIS — R45851 Suicidal ideations: Secondary | ICD-10-CM

## 2017-01-07 DIAGNOSIS — Z810 Family history of intellectual disabilities: Secondary | ICD-10-CM

## 2017-01-07 LAB — CBC
HCT: 33.3 % — ABNORMAL LOW (ref 36.0–46.0)
Hemoglobin: 11.4 g/dL — ABNORMAL LOW (ref 12.0–15.0)
MCH: 33.4 pg (ref 26.0–34.0)
MCHC: 34.2 g/dL (ref 30.0–36.0)
MCV: 97.7 fL (ref 78.0–100.0)
Platelets: 200 10*3/uL (ref 150–400)
RBC: 3.41 MIL/uL — ABNORMAL LOW (ref 3.87–5.11)
RDW: 13.1 % (ref 11.5–15.5)
WBC: 11.5 10*3/uL — ABNORMAL HIGH (ref 4.0–10.5)

## 2017-01-07 LAB — WET PREP, GENITAL
Clue Cells Wet Prep HPF POC: NONE SEEN
Sperm: NONE SEEN
Trich, Wet Prep: NONE SEEN
Yeast Wet Prep HPF POC: NONE SEEN

## 2017-01-07 LAB — HEMOGLOBIN A1C
Hgb A1c MFr Bld: 5.3 % (ref 4.8–5.6)
Mean Plasma Glucose: 105 mg/dL

## 2017-01-07 LAB — URINALYSIS, ROUTINE W REFLEX MICROSCOPIC
Bilirubin Urine: NEGATIVE
Glucose, UA: NEGATIVE mg/dL
Hgb urine dipstick: NEGATIVE
Ketones, ur: NEGATIVE mg/dL
Nitrite: NEGATIVE
Protein, ur: NEGATIVE mg/dL
Specific Gravity, Urine: 1.012 (ref 1.005–1.030)
pH: 7 (ref 5.0–8.0)

## 2017-01-07 LAB — PROLACTIN: Prolactin: 54.3 ng/mL — ABNORMAL HIGH (ref 4.8–23.3)

## 2017-01-07 MED ORDER — ACETAMINOPHEN 325 MG PO TABS
650.0000 mg | ORAL_TABLET | Freq: Four times a day (QID) | ORAL | Status: DC | PRN
  Administered 2017-01-10 (×2): 650 mg via ORAL
  Filled 2017-01-07 (×2): qty 2

## 2017-01-07 MED ORDER — ONDANSETRON HCL 4 MG PO TABS: 4.0000 mg | ORAL_TABLET | Freq: Three times a day (TID) | ORAL | Status: DC | PRN

## 2017-01-07 MED ORDER — ALUM & MAG HYDROXIDE-SIMETH 200-200-20 MG/5ML PO SUSP
30.0000 mL | ORAL | Status: DC | PRN
  Administered 2017-01-07: 30 mL via ORAL
  Filled 2017-01-07: qty 30

## 2017-01-07 MED ORDER — OLANZAPINE 7.5 MG PO TABS
7.5000 mg | ORAL_TABLET | Freq: Every day | ORAL | Status: DC
  Administered 2017-01-07 – 2017-01-09 (×3): 7.5 mg via ORAL
  Filled 2017-01-07 (×2): qty 1
  Filled 2017-01-07: qty 3
  Filled 2017-01-07 (×3): qty 1

## 2017-01-07 MED ORDER — MAGNESIUM HYDROXIDE 400 MG/5ML PO SUSP
30.0000 mL | Freq: Every day | ORAL | Status: DC | PRN
  Administered 2017-01-08: 30 mL via ORAL
  Filled 2017-01-07: qty 30

## 2017-01-07 MED ORDER — FLUOXETINE HCL 10 MG PO CAPS
10.0000 mg | ORAL_CAPSULE | Freq: Every day | ORAL | Status: DC
  Administered 2017-01-08: 10 mg via ORAL
  Filled 2017-01-07 (×2): qty 1

## 2017-01-07 MED ORDER — DIPHENHYDRAMINE HCL 25 MG PO CAPS
25.0000 mg | ORAL_CAPSULE | Freq: Four times a day (QID) | ORAL | Status: DC | PRN
  Administered 2017-01-07: 25 mg via ORAL
  Filled 2017-01-07: qty 1

## 2017-01-07 NOTE — MAU Provider Note (Signed)
Subjective:  Patient ID: Susan [name], female    DOB: [birth year]  Age: 41 y.o. MRN: [id number]  CC: Abdominal Pain   HPI Susan [name] is a 41 y.o. female with a history of MDD, polysubstance abuse, and prior history of ectopic pregnancy, BV, and trichomonas who presents for abdominal cramping. Patient states that this suddenly started 3 days ago as 5/10 pain that was intermittent. Patient currently reports that it is 7/10 pain that has become more constant and has an upside down T shaped distrubution. Patient reports that bowel habit has remained relatively unchanged. Patient goes once a day but feels she is constipated at baseline. Last bowel movement was shortly before visit and was dark brown pellets with no blood. She endorsed a lot of straining. Patient endorses intermittent nausea, but denies any vomiting.   Patient mentions that it sometimes hurts to urinate and that she does not urinate a lot. She denies any burning or itching while urinating.     Patient has 2  sexual partners, one of which she has unprotected vaginal sex with. Patient mentions that she has had vaginal discharge with a fishy odor. Patient endorses chills and night sweats, but denies vaginal bleeding and hematuria  Past Medical History:  Diagnosis Date  . Alcohol abuse   . Asthma   . BV (bacterial vaginosis) 03/06/2013  . Chronic dental pain   . Chronic pelvic pain in female   . Depression   . Ectopic pregnancy   . Panic attacks   . Polysubstance abuse    cocaine, opiates, marijuana  . Trichomonas vaginitis     No Known Allergies  No prescriptions prior to admission.    Past Surgical History:  Procedure Laterality Date  . ECTOPIC PREGNANCY SURGERY      OB History    Gravida Para Term Preterm AB Living   8 4 4   3 4    SAB TAB Ectopic Multiple Live Births     2 1 0 4      Family History  Problem Relation Age of Onset  . COPD Mother   . Stroke Mother   . Heart disease Maternal Grandmother   .  Hypertension Maternal Grandmother   . Stroke Maternal Grandfather   . Asthma Sister   . Down syndrome Sister     Social History  Substance Use Topics  . Smoking status: Current Every Day Smoker    Packs/day: 1.00    Types: Cigarettes  . Smokeless tobacco: Never Used  . Alcohol use Yes     Comment: occ    ROS Review of Systems  Constitutional: Positive for chills and diaphoresis. Negative for fever.  Respiratory: Negative for shortness of breath.   Cardiovascular: Negative for chest pain and leg swelling.  Gastrointestinal: Positive for constipation and nausea. Negative for abdominal pain, blood in stool, diarrhea and vomiting.  Genitourinary: Positive for dysuria and vaginal discharge. Negative for vaginal bleeding.    Objective:   Today's Vitals: BP (!) 114/53 (BP Location: Left Arm)   Pulse 84   Temp 98.3 F (36.8 C) (Oral)   Resp 18   Ht 5\' 2"  (1.575 m)   Wt 66.8 kg (147 lb 4 oz)   LMP  (LMP Unknown) Comment: Pt reports that she is pregnant.  She does not give me LMP or any additional information  SpO2 99%   BMI 26.93 kg/m   Physical Exam  Constitutional: She is oriented to person, place, and time. She appears well-developed  and well-nourished.  HENT:  Head: Normocephalic and atraumatic.  Cardiovascular: Normal rate, regular rhythm and normal heart sounds.  Exam reveals no gallop and no friction rub.   No murmur heard. Pulmonary/Chest: Effort normal and breath sounds normal. She has no wheezes. She has no rales.  Abdominal: Soft. Bowel sounds are normal. There is generalized tenderness. There is no rebound.  Musculoskeletal: Normal range of motion. She exhibits no edema.  Neurological: She is alert and oriented to person, place, and time.  Skin: Skin is warm and dry.   FHT FHT: Doppler, 156 bpm  Results for orders placed or performed during the hospital encounter of 01/05/17 (from the past 24 hour(s))  Urinalysis, Routine w reflex microscopic   Collection  Time: 01/07/17  9:11 AM  Result Value Ref Range   Color, Urine YELLOW YELLOW   APPearance CLEAR CLEAR   Specific Gravity, Urine 1.012 1.005 - 1.030   pH 7.0 5.0 - 8.0   Glucose, UA NEGATIVE NEGATIVE mg/dL   Hgb urine dipstick NEGATIVE NEGATIVE   Bilirubin Urine NEGATIVE NEGATIVE   Ketones, ur NEGATIVE NEGATIVE mg/dL   Protein, ur NEGATIVE NEGATIVE mg/dL   Nitrite NEGATIVE NEGATIVE   Leukocytes, UA TRACE (A) NEGATIVE   RBC / HPF 0-5 0 - 5 RBC/hpf   WBC, UA 0-5 0 - 5 WBC/hpf   Bacteria, UA RARE (A) NONE SEEN   Squamous Epithelial / LPF 0-5 (A) NONE SEEN   Mucous PRESENT   Wet prep, genital   Collection Time: 01/07/17  9:50 AM  Result Value Ref Range   Yeast Wet Prep HPF POC NONE SEEN NONE SEEN   Trich, Wet Prep NONE SEEN NONE SEEN   Clue Cells Wet Prep HPF POC NONE SEEN NONE SEEN   WBC, Wet Prep HPF POC FEW (A) NONE SEEN   Sperm NONE SEEN   CBC   Collection Time: 01/07/17 10:36 AM  Result Value Ref Range   WBC 11.5 (H) 4.0 - 10.5 K/uL   RBC 3.41 ([name]) 3.87 - 5.11 MIL/uL   Hemoglobin 11.4 ([name]) 12.0 - 15.0 g/dL   HCT 14.7 ([name]) 82.9 - 56.2 %   MCV 97.7 78.0 - 100.0 fL   MCH 33.4 26.0 - 34.0 pg   MCHC 34.2 30.0 - 36.0 g/dL   RDW 13.0 86.5 - 78.4 %   Platelets 200 150 - 400 K/uL    Assessment & Plan:   Assessment: Patient Active Problem List   Diagnosis Date Noted  . MDD (major depressive disorder), single episode, severe with psychotic features (HCC) 01/05/2017  . Alcohol use disorder, severe, dependence (HCC) 01/05/2017  . Cocaine use disorder, moderate, dependence (HCC) 01/05/2017  . Cannabis use disorder, moderate, dependence (HCC) 01/05/2017  . Pregnancy 01/05/2017  . Heavy smoker 02/03/2016  . Cocaine use 02/03/2016  . Chronic, continuous use of opioids 03/24/2015  . Depression with anxiety 03/24/2015  . Poor dentition 03/24/2015  . CLOSED FRACTURE DISTAL PHALANX OR PHALANGES HAND 06/05/2009  . Sprain of ankle, unspecified site 05/28/2009   Susan [name] is a 41  y.o. [phone] female with MDD, polysubstance use disorder, normal WBC, normal UA, and negative wet prep who presents with abdominal cramping.  Plan:  Problem List Items Addressed This Visit      Other   Pregnancy   Relevant Orders   US OB Comp Less 14 Wks (Completed)   US OB Transvaginal (Completed)     #Abdominal Pain Patient has had 3 day history of generalized abdominal  pain. UA was negative and WBC was normal. Patient and fetus vitals are stable. Unlikely to have UTI, preterm labor, or appendicitis. Suspect gastrointestinal cramp and/or uterine stretching pain.   #Vaginal Discharge in Pregnancy We performed a wet prep that was found to be negative. GC results are still pending. Will treat if positive.  - GC DNA Probe pending  #MDD [facility] Will transfer her back into their care.  - transfer to behavioral health  Follow-up: No Follow-up on file.   Caryn BeeKeku, John A, Medical Student 01/07/2017 10:03 AM  The patient was seen and examined by me also    >>>>   Please see my note for official documentation Agree with note  Cervical exams as listed in note Ready for transfer back Will have her followup in clinic as scheduled Aviva SignsWilliams, Susan [name], CNM  I confirm that I have verified the information documented in the Medical Student's note and that I have also personally performed the physical exam and all medical decision making activities.

## 2017-01-07 NOTE — MAU Note (Signed)
Pt presents to MAU from behavioral health with complaints of lower abdominal cramping that started 3 days ago. Denies any VB or abnormal discharge

## 2017-01-07 NOTE — MAU Note (Signed)
Urine in lab 

## 2017-01-07 NOTE — Discharge Instructions (Signed)
Abdominal Bloating When you have abdominal bloating, your abdomen may feel full, tight, or painful. It may also look bigger than normal or swollen (distended). Common causes of abdominal bloating include:  Swallowing air.  Constipation.  Problems digesting food.  Eating too much.  Irritable bowel syndrome. This is a condition that affects the large intestine.  Lactose intolerance. This is an inability to digest lactose, a natural sugar in dairy products.  Celiac disease. This is a condition that affects the ability to digest gluten, a protein found in some grains.  Gastroparesis. This is a condition that slows down the movement of food in the stomach and small intestine. It is more common in people with diabetes mellitus.  Gastroesophageal reflux disease (GERD). This is a digestive condition that makes stomach acid flow back into the esophagus.  Urinary retention. This means that the body is holding onto urine, and the bladder cannot be emptied all the way.  Follow these instructions at home: Eating and drinking  Avoid eating too much.  Try not to swallow air while talking or eating.  Avoid eating while lying down.  Avoid these foods and drinks: ? Foods that cause gas, such as broccoli, cabbage, cauliflower, and baked beans. ? Carbonated drinks. ? Hard candy. ? Chewing gum. Medicines  Take over-the-counter and prescription medicines only as told by your health care provider.  Take probiotic medicines. These medicines contain live bacteria or yeasts that can help digestion.  Take coated peppermint oil capsules. Activity  Try to exercise regularly. Exercise may help to relieve bloating that is caused by gas and relieve constipation. General instructions  Keep all follow-up visits as told by your health care provider. This is important. Contact a health care provider if:  You have nausea and vomiting.  You have diarrhea.  You have abdominal pain.  You have  unusual weight loss or weight gain.  You have severe pain, and medicines do not help. Get help right away if:  You have severe chest pain.  You have trouble breathing.  You have shortness of breath.  You have trouble urinating.  You have darker urine than normal.  You have blood in your stools or have dark, tarry stools. Summary  Abdominal bloating means that the abdomen is swollen.  Common causes of abdominal bloating are swallowing air, constipation, and problems digesting food.  Avoid eating too much and avoid swallowing air.  Avoid foods that cause gas, carbonated drinks, hard candy, and chewing gum. This information is not intended to replace advice given to you by your health care provider. Make sure you discuss any questions you have with your health care provider. Document Released: 08/27/2016 Document Revised: 08/27/2016 Document Reviewed: 08/27/2016 Elsevier Interactive Patient Education  2018 [employer]. Vaginitis Vaginitis is a condition in which the vaginal tissue swells and becomes red (inflamed). This condition is most often caused by a change in the normal balance of bacteria and yeast that live in the vagina. This change causes an overgrowth of certain bacteria or yeast, which causes the inflammation. There are different types of vaginitis, but the most common types are:  Bacterial vaginosis.  Yeast infection (candidiasis).  Trichomoniasis vaginitis. This is a sexually transmitted disease (STD).  Viral vaginitis.  Atrophic vaginitis.  Allergic vaginitis.  What are the causes? The cause of this condition depends on the type of vaginitis. It can be caused by:  Bacteria (bacterial vaginosis).  Yeast, which is a fungus (yeast infection).  A parasite (trichomoniasis vaginitis).  A virus (viral vaginitis).  Low hormone levels (atrophic vaginitis). Low hormone levels can occur during pregnancy, breastfeeding, or after menopause.  Irritants, such as  bubble baths, scented tampons, and feminine sprays (allergic vaginitis).  Other factors can change the normal balance of the yeast and bacteria that live in the vagina. These include:  Antibiotic medicines.  Poor hygiene.  Diaphragms, vaginal sponges, spermicides, birth control pills, and intrauterine devices (IUD).  Sex.  Infection.  Uncontrolled diabetes.  A weakened defense (immune) system.  What increases the risk? This condition is more likely to develop in women who:  Smoke.  Use vaginal douches, scented tampons, or scented sanitary pads.  Wear tight-fitting pants.  Wear thong underwear.  Use oral birth control pills or an IUD.  Have sex without a condom.  Have multiple sex partners.  Have an STD.  Frequently use the spermicide nonoxynol-9.  Eat lots of foods high in sugar.  Have uncontrolled diabetes.  Have low estrogen levels.  Have a weakened immune system from an immune disorder or medical treatment.  Are pregnant or breastfeeding.  What are the signs or symptoms? Symptoms vary depending on the cause of the vaginitis. Common symptoms include:  Abnormal vaginal discharge. ? The discharge is white, gray, or yellow with bacterial vaginosis. ? The discharge is thick, white, and cheesy with a yeast infection. ? The discharge is frothy and yellow or greenish with trichomoniasis.  A bad vaginal smell. The smell is fishy with bacterial vaginosis.  Vaginal itching, pain, or swelling.  Sex that is painful.  Pain or burning when urinating.  Sometimes there are no symptoms. How is this diagnosed? This condition is diagnosed based on your symptoms and medical history. A physical exam, including a pelvic exam, will also be done. You may also have other tests, including:  Tests to determine the pH level (acidity or alkalinity) of your vagina.  A whiff test, to assess the odor that results when a sample of your vaginal discharge is mixed with a  potassium hydroxide solution.  Tests of vaginal fluid. A sample will be examined under a microscope.  How is this treated? Treatment varies depending on the type of vaginitis you have. Your treatment may include:  Antibiotic creams or pills to treat bacterial vaginosis and trichomoniasis.  Antifungal medicines, such as vaginal creams or suppositories, to treat a yeast infection.  Medicine to ease discomfort if you have viral vaginitis. Your sexual partner should also be treated.  Estrogen delivered in a cream, pill, suppository, or vaginal ring to treat atrophic vaginitis. If vaginal dryness occurs, lubricants and moisturizing creams may help. You may need to avoid scented soaps, sprays, or douches.  Stopping use of a product that is causing allergic vaginitis. Then using a vaginal cream to treat the symptoms.  Follow these instructions at home: Lifestyle  Keep your genital area clean and dry. Avoid soap, and only rinse the area with water.  Do not douche or use tampons until your health care provider says it is okay to do so. Use sanitary pads, if needed.  Do not have sex until your health care provider approves. When you can return to sex, practice safe sex and use condoms.  Wipe from front to back. This avoids the spread of bacteria from the rectum to the vagina. General instructions  Take over-the-counter and prescription medicines only as told by your health care provider.  If you were prescribed an antibiotic medicine, take or use it as told by your health  care provider. Do not stop taking or using the antibiotic even if you start to feel better.  Keep all follow-up visits as told by your health care provider. This is important. How is this prevented?  Use mild, non-scented products. Do not use things that can irritate the vagina, such as fabric softeners. Avoid the following products if they are scented: ? Feminine sprays. ? Detergents. ? Tampons. ? Feminine hygiene  products. ? Soaps or bubble baths.  Let air reach your genital area. ? Wear cotton underwear to reduce moisture buildup. ? Avoid wearing underwear while you sleep. ? Avoid wearing tight pants and underwear or nylons without a cotton panel. ? Avoid wearing thong underwear.  Take off any wet clothing, such as bathing suits, as soon as possible.  Practice safe sex and use condoms. Contact a health care provider if:  You have abdominal pain.  You have a fever.  You have symptoms that last for more than 2-3 days. Get help right away if:  You have a fever and your symptoms suddenly get worse. Summary  Vaginitis is a condition in which the vaginal tissue becomes inflamed.This condition is most often caused by a change in the normal balance of bacteria and yeast that live in the vagina.  Treatment varies depending on the type of vaginitis you have.  Do not douche, use tampons , or have sex until your health care provider approves. When you can return to sex, practice safe sex and use condoms. This information is not intended to replace advice given to you by your health care provider. Make sure you discuss any questions you have with your health care provider. Document Released: 05/23/2007 Document Revised: 08/31/2016 Document Reviewed: 08/31/2016 Elsevier Interactive Patient Education  [employer]2018 Else[employer] Inc[employer]

## 2017-01-07 NOTE — Progress Notes (Signed)
Nursing Note 01/07/2017 [id number]  Data Reports sleeping fair with PRN sleep med.  Rates depression 5/10, hopelessness 5/10, and anxiety 5/10. Affect blunted but appropriate.  Denies HI, SI, AVH.  [facility], returned with all tests negative, instructions to continue to monitor.  Reports nausea. No alcohol withdrawal symptoms.  Action Spoke with patient 1:1, nurse offered support to patient throughout shift.  Ativan detox protocol discontinued.  Given benadryl for nausea and anxiety this AM.  Encouraged to eat small frequent snacks.  Continues to be monitored on 15 minute checks for safety.  Response Remains safe, reports reduction in symptoms from AM, staff to continue monitoring.

## 2017-01-07 NOTE — H&P (Signed)
Psychiatric Admission Assessment Adult  Patient Identification: Susan [name] MRN:  [id number] Date of Evaluation:  01/07/2017 Chief Complaint:  MDD single episode with psychotic features Principal Diagnosis: <principal problem not specified> Diagnosis:   Patient Active Problem List   Diagnosis Date Noted  . MDD (major depressive disorder), recurrent episode, severe ([place]) [F33.2] 01/07/2017  . MDD (major depressive disorder), single episode, severe with psychotic features ([place]) [F32.3] 01/05/2017  . Alcohol use disorder, severe, dependence ([place]) [F10.20] 01/05/2017  . Cocaine use disorder, moderate, dependence ([place]) [F14.20] 01/05/2017  . Cannabis use disorder, moderate, dependence ([place]) [F12.20] 01/05/2017  . Pregnancy [Z34.90] 01/05/2017  . Heavy smoker [F17.200] 02/03/2016  . Cocaine use [F14.90] 02/03/2016  . Chronic, continuous use of opioids [F11.90] 03/24/2015  . Depression with anxiety [F41.8] 03/24/2015  . Poor dentition [K08.9] 03/24/2015  . CLOSED FRACTURE DISTAL PHALANX OR PHALANGES HAND [S62.639A] 06/05/2009  . Sprain of ankle, unspecified site [S93.409A] 05/28/2009   History of Present Illness: Patient is a 41 year old female who lives with her mother in [place], reports that she struggling with depression and polysubstance use. Patient was admitted on the unit on 01/04/2017, was transferred on 01/07/2017 for an evaluation at [facility] due to reports of contractions. Patient was accidentally discharged in the system and requires re admission  Patient reports that she is doing better with her depression, on a scale of 0-10 with 0 being no symptoms and 10 being the worst reports her depression is a 5 out of 10. He also adds that her anxiety on the same scale is a 6 out of 10. He states that she feels better as she was seen at [facility] and is not in labor. She has that she lives with her mom, mom is supportive and so is the baby's father. Patient reports that  she's had on and off suicidal thoughts, no she needs to stay clean so that her baby is okay. She has that she also wants some more to improve as she needs to be able to keep herself and her baby safe.  Patient denies any psychotic symptoms, reports that she is tolerating her medications well. Patient states she is no longer having visual hallucinations but still endorses feeling overwhelmed at times Associated Signs/Symptoms: Depression Symptoms:  depressed mood, insomnia, difficulty concentrating, hopelessness, suicidal thoughts without plan, disturbed sleep, (Hypo) Manic Symptoms:  Impulsivity, Anxiety Symptoms:  Excessive Worry, Psychotic Symptoms:  Hallucinations: None PTSD Symptoms: Negative Total Time spent with patient: 30 minutes  Past Psychiatric History: Unchanged from previous H&P  Is the patient at risk to self? Yes.    Has the patient been a risk to self in the past 6 months? Yes.    Has the patient been a risk to self within the distant past? No.  Is the patient a risk to others? No.  Has the patient been a risk to others in the past 6 months? No.  Has the patient been a risk to others within the distant past? No.   Prior Inpatient Therapy:   Prior Outpatient Therapy:    Alcohol Screening:   Substance Abuse History in the last 12 months:  Yes.   Consequences of Substance Abuse: Patient is currently pregnant Previous Psychotropic Medications: Yes  Psychological Evaluations: No  Past Medical History:  Past Medical History:  Diagnosis Date  . Alcohol abuse   . Asthma   . BV (bacterial vaginosis) 03/06/2013  . Chronic dental pain   . Chronic pelvic pain in female   .  Depression   . Ectopic pregnancy   . Panic attacks   . Polysubstance abuse    cocaine, opiates, marijuana  . Trichomonas vaginitis     Past Surgical History:  Procedure Laterality Date  . ECTOPIC PREGNANCY SURGERY     Family History:  Family History  Problem Relation Age of Onset  . COPD  Mother   . Stroke Mother   . Heart disease Maternal Grandmother   . Hypertension Maternal Grandmother   . Stroke Maternal Grandfather   . Asthma Sister   . Down syndrome Sister    Family Psychiatric  History: Unchanged from previous H&P Tobacco Screening:   Social History:  History  Alcohol Use  . Yes    Comment: occasionally     History  Drug Use  . Types: Marijuana, Cocaine    Comment: 1 week ago cocaine    Additional Social History:                           Allergies:  No Known Allergies Lab Results:  Results for orders placed or performed during the hospital encounter of 01/05/17 (from the past 48 hour(s))  TSH     Status: None   Collection Time: 01/06/17  6:29 AM  Result Value Ref Range   TSH 2.463 0.350 - 4.500 uIU/mL    Comment: Performed by a 3rd Generation assay with a functional sensitivity of <=0.01 uIU/mL. Performed at [facility], [place] [address], [place], [place] [address]   Lipid panel     Status: Abnormal   Collection Time: 01/06/17  6:29 AM  Result Value Ref Range   Cholesterol 125 0 - 200 mg/dL   Triglycerides 185 (H) <150 mg/dL   HDL 65 >40 mg/dL   Total CHOL/HDL Ratio 1.9 RATIO   VLDL 37 0 - 40 mg/dL   LDL Cholesterol 23 0 - 99 mg/dL    Comment:        Total Cholesterol/HDL:CHD Risk Coronary Heart Disease Risk Table                     Men   Women  1/2 Average Risk   3.4   3.3  Average Risk       5.0   4.4  2 X Average Risk   9.6   7.1  3 X Average Risk  23.4   11.0        Use the calculated Patient Ratio above and the CHD Risk Table to determine the patient's CHD Risk.        ATP III CLASSIFICATION (LDL):  <100     mg/dL   Optimal  100-129  mg/dL   Near or Above                    Optimal  130-159  mg/dL   Borderline  160-189  mg/dL   High  >190     mg/dL   Very High Performed at [place] [address] [place], [place] [address]   Prolactin     Status: Abnormal   Collection Time:  01/06/17  6:29 AM  Result Value Ref Range   Prolactin 54.3 (H) 4.8 - 23.3 ng/mL    Comment: (NOTE) Performed At: [facility] [place], [place] [id number] Lindon Romp MD [id number] Performed at [facility], [place] [address], [place], [place] [address]   Hemoglobin  A1c     Status: None   Collection Time: 01/06/17  6:29 AM  Result Value Ref Range   Hgb A1c MFr Bld 5.3 4.8 - 5.6 %    Comment: (NOTE)         Pre-diabetes: 5.7 - 6.4         Diabetes: >6.4         Glycemic control for adults with diabetes: <7.0    Mean Plasma Glucose 105 mg/dL    Comment: (NOTE) Performed At: [facility] [place], [place] [id number] Lindon Romp MD [id number] Performed at [facility], [place] [address], [place], [place] [address]   Comprehensive metabolic panel     Status: Abnormal   Collection Time: 01/06/17  6:29 AM  Result Value Ref Range   Sodium 135 135 - 145 mmol/L   Potassium 3.8 3.5 - 5.1 mmol/L   Chloride 105 101 - 111 mmol/L   CO2 22 22 - 32 mmol/L   Glucose, Bld 118 (H) 65 - 99 mg/dL   BUN 7 6 - 20 mg/dL   Creatinine, Ser 0.46 0.44 - 1.00 mg/dL   Calcium 8.6 (L) 8.9 - 10.3 mg/dL   Total Protein 6.4 (L) 6.5 - 8.1 g/dL   Albumin 3.5 3.5 - 5.0 g/dL   AST 17 15 - 41 U/L   ALT 13 (L) 14 - 54 U/L   Alkaline Phosphatase 49 38 - 126 U/L   Total Bilirubin 0.1 (L) 0.3 - 1.2 mg/dL   GFR calc non Af Amer >60 >60 mL/min   GFR calc Af Amer >60 >60 mL/min    Comment: (NOTE) The eGFR has been calculated using the CKD EPI equation. This calculation has not been validated in all clinical situations. eGFR's persistently <60 mL/min signify possible Chronic Kidney Disease.    Anion gap 8 5 - 15    Comment: Performed at [facility], [place] [address], [place], [place] [address]  Urinalysis, Routine w reflex microscopic     Status: Abnormal   Collection Time: 01/07/17  9:11 AM  Result  Value Ref Range   Color, Urine YELLOW YELLOW   APPearance CLEAR CLEAR   Specific Gravity, Urine 1.012 1.005 - 1.030   pH 7.0 5.0 - 8.0   Glucose, UA NEGATIVE NEGATIVE mg/dL   Hgb urine dipstick NEGATIVE NEGATIVE   Bilirubin Urine NEGATIVE NEGATIVE   Ketones, ur NEGATIVE NEGATIVE mg/dL   Protein, ur NEGATIVE NEGATIVE mg/dL   Nitrite NEGATIVE NEGATIVE   Leukocytes, UA TRACE (A) NEGATIVE   RBC / HPF 0-5 0 - 5 RBC/hpf   WBC, UA 0-5 0 - 5 WBC/hpf   Bacteria, UA RARE (A) NONE SEEN   Squamous Epithelial / LPF 0-5 (A) NONE SEEN   Mucous PRESENT   Wet prep, genital     Status: Abnormal   Collection Time: 01/07/17  9:50 AM  Result Value Ref Range   Yeast Wet Prep HPF POC NONE SEEN NONE SEEN   Trich, Wet Prep NONE SEEN NONE SEEN   Clue Cells Wet Prep HPF POC NONE SEEN NONE SEEN   WBC, Wet Prep HPF POC FEW (A) NONE SEEN    Comment: MODERATE BACTERIA SEEN   Sperm NONE SEEN   CBC     Status: Abnormal   Collection Time: 01/07/17 10:36 AM  Result Value Ref Range   WBC 11.5 (H) 4.0 - 10.5 K/uL   RBC 3.41 (L) 3.87 - 5.11 MIL/uL  Hemoglobin 11.4 (L) 12.0 - 15.0 g/dL   HCT 33.3 (L) 36.0 - 46.0 %   MCV 97.7 78.0 - 100.0 fL   MCH 33.4 26.0 - 34.0 pg   MCHC 34.2 30.0 - 36.0 g/dL   RDW 13.1 11.5 - 15.5 %   Platelets 200 150 - 400 K/uL    Blood Alcohol level:  Lab Results  Component Value Date   ETH 152 (H) 01/04/2017   ETH 341 (HH) 84/13/2440    Metabolic Disorder Labs:  Lab Results  Component Value Date   HGBA1C 5.3 01/06/2017   MPG 105 01/06/2017   Lab Results  Component Value Date   PROLACTIN 54.3 (H) 01/06/2017   Lab Results  Component Value Date   CHOL 125 01/06/2017   TRIG 185 (H) 01/06/2017   HDL 65 01/06/2017   CHOLHDL 1.9 01/06/2017   VLDL 37 01/06/2017   LDLCALC 23 01/06/2017    Current Medications: Current Facility-Administered Medications  Medication Dose Route Frequency Provider Last Rate Last Dose  . acetaminophen (TYLENOL) tablet 650 mg  650 mg Oral Q6H  PRN Hampton Abbot, MD      . alum & mag hydroxide-simeth (MAALOX/MYLANTA) 200-200-20 MG/5ML suspension 30 mL  30 mL Oral Q4H PRN Hampton Abbot, MD      . Derrill Memo ON 01/08/2017] FLUoxetine (PROZAC) capsule 10 mg  10 mg Oral Daily Hampton Abbot, MD      . magnesium hydroxide (MILK OF MAGNESIA) suspension 30 mL  30 mL Oral Daily PRN Hampton Abbot, MD      . OLANZapine (ZYPREXA) tablet 7.5 mg  7.5 mg Oral QHS Hampton Abbot, MD      . ondansetron [facility]) tablet 4 mg  4 mg Oral Q8H PRN Hampton Abbot, MD       PTA Medications: No prescriptions prior to admission.    Musculoskeletal: Strength & Muscle Tone: within normal limits Gait & Station: normal Patient leans: N/A  Psychiatric Specialty Exam: Physical Exam  Review of Systems  Constitutional: Positive for malaise/fatigue. Negative for chills, diaphoresis, fever and weight loss.  HENT: Negative.  Negative for congestion, hearing loss and sore throat.   Eyes: Negative.  Negative for blurred vision and double vision.  Respiratory: Negative.  Negative for cough, shortness of breath and wheezing.   Cardiovascular: Negative for chest pain and palpitations.  Gastrointestinal: Negative.  Negative for heartburn, nausea and vomiting.  Genitourinary: Negative.  Negative for dysuria.       Is pregnant  Musculoskeletal: Negative.  Negative for falls and myalgias.  Skin: Negative.  Negative for rash.  Neurological: Negative.  Negative for dizziness, seizures, loss of consciousness, weakness and headaches.  Endo/Heme/Allergies: Negative.  Negative for environmental allergies.  Psychiatric/Behavioral: Positive for depression, substance abuse and suicidal ideas. Negative for hallucinations and memory loss. The patient is nervous/anxious and has insomnia.     unknown if currently breastfeeding.There is no height or weight on file to calculate BMI.  General Appearance: Casual  Eye Contact:  Fair  Speech:  Clear and Coherent and Normal Rate  Volume:   Normal  Mood:  Depressed and Dysphoric  Affect:  Non-Congruent  Thought Process:  Coherent and Descriptions of Associations: Intact  Orientation:  Full (Time, Place, and Person)  Thought Content:  Logical and Rumination  Suicidal Thoughts:  Yes.  without intent/plan  Homicidal Thoughts:  No  Memory:  Immediate;   Fair Recent;   Fair Remote;   Fair  Judgement:  Poor  Insight:  Shallow  Psychomotor  Activity:  Normal  Concentration:  Concentration: Fair and Attention Span: Fair  Recall:  [employer] of Knowledge:  Fair  Language:  Fair  Akathisia:  No  Handed:  Right  AIMS (if indicated):     Assets:  Communication Skills Desire for Improvement Housing Social Support  ADL's:  Intact  Cognition:  WNL  Sleep:       Treatment Plan Summary: Daily contact with patient to assess and evaluate symptoms and progress in treatment and Medication management  Observation Level/Precautions:  15 minute checks  Laboratory:  SHEENT recently had all her lab work done, it was all within normal limits which include a comprehensive metabolic panel, CBC with differential count, patient also had a fetal ultrasound  Psychotherapy:    Medications:    Consultations:    Discharge Concerns:    Estimated LOS:  Other:     Physician Treatment Plan for Primary Diagnosis: <principal problem not specified> Long Term Goal(s): Improvement in symptoms so as ready for discharge  Short Term Goals: Ability to verbalize feelings will improve, Ability to disclose and discuss suicidal ideas, Ability to demonstrate self-control will improve and Ability to identify and develop effective coping behaviors will improve  Physician Treatment Plan for Secondary Diagnosis: Active Problems:   MDD (major depressive disorder), recurrent episode, severe ([place])  Long Term Goal(s): Improvement in symptoms so as ready for discharge  Short Term Goals: Ability to identify changes in lifestyle to reduce recurrence of condition  will improve, Ability to maintain clinical measurements within normal limits will improve, Compliance with prescribed medications will improve and Ability to identify triggers associated with substance abuse/mental health issues will improve  I certify that inpatient services furnished can reasonably be expected to improve the patient's condition.    Hampton Abbot, MD 6/1/20184:22 PM

## 2017-01-07 NOTE — MAU Provider Note (Signed)
Chief Complaint: Abdominal Pain   First Provider Initiated Contact with Patient 01/07/17 [phone]        SUBJECTIVE HPI:  Susan [name] is a 41 y.o. female with a history of MDD, polysubstance abuse, and prior history of ectopic pregnancy, BV, and trichomonas who presents for abdominal cramping and vaginal discharge with odor.  She is sure it is bacterial vaginosis.  . Patient states that this suddenly started 3 days ago as 5/10 pain that was intermittent. Patient currently reports that it is 7/10 pain. Patient reports that bowel habit has remained relatively unchanged. Patient goes once a day but stools are small and hard.  Last bowel movement was shortly before visit. She endorsed a lot of straining. Patient endorses intermittent nausea, but denies any vomiting.   Patient mentions that it sometimes hurts to urinate and that she does not urinate a lot. She denies any burning or itching while urinating.     Patient has 2  sexual partners, one of which she has unprotected vaginal sex with. Patient mentions that she has had vaginal discharge with a fishy odor. Patient endorses chills and night sweats, but denies vaginal bleeding and hematuria  Abdominal Pain  This is a new problem. The current episode started in the past 7 days. The onset quality is gradual. The problem occurs intermittently. The problem has been unchanged. The pain is located in the generalized abdominal region. The pain is moderate. The quality of the pain is cramping. The abdominal pain does not radiate. Associated symptoms include constipation. Pertinent negatives include no diarrhea, frequency, myalgias, nausea or vomiting. Nothing aggravates the pain. The pain is relieved by nothing. She has tried nothing for the symptoms.    Past Medical History:  Diagnosis Date  . Alcohol abuse   . Asthma   . BV (bacterial vaginosis) 03/06/2013  . Chronic dental pain   . Chronic pelvic pain in female   . Depression   . Ectopic pregnancy    . Panic attacks   . Polysubstance abuse    cocaine, opiates, marijuana  . Trichomonas vaginitis    Past Surgical History:  Procedure Laterality Date  . ECTOPIC PREGNANCY SURGERY     Social History   Social History  . Marital status: Single    Spouse name: N/A  . Number of children: N/A  . Years of education: N/A   Occupational History  . Not on file.   Social History Main Topics  . Smoking status: Current Every Day Smoker    Packs/day: 1.00    Types: Cigarettes  . Smokeless tobacco: Never Used  . Alcohol use Yes     Comment: occasionally  . Drug use: Yes    Types: Marijuana, Cocaine     Comment: 1 week ago cocaine  . Sexual activity: Yes    Birth control/ protection: None     Comment: vasectomy   Other Topics Concern  . Not on file   Social History Narrative  . No narrative on file   No current facility-administered medications on file prior to encounter.    No current outpatient prescriptions on file prior to encounter.   No Known Allergies  I have reviewed patient's Past Medical Hx, Surgical Hx, Family Hx, Social Hx, medications and allergies.   ROS:  Review of Systems  Gastrointestinal: Positive for abdominal pain and constipation. Negative for diarrhea, nausea and vomiting.  Genitourinary: Negative for frequency.  Musculoskeletal: Negative for myalgias.   Review of Systems  Other systems negative  Physical Exam  Physical Exam Patient Vitals for the past 24 hrs:  BP Temp Temp src Pulse Resp SpO2 Height Weight  01/07/17 1135 100/66 - - 66 16 - - -  01/07/17 0916 (!) 114/53 98.3 F (36.8 C) Oral 84 18 99 % 5\' 2"  (1.575 m) 147 lb 4 oz (66.8 kg)  01/07/17 0801 (!) 94/55 - - 91 - - - -  01/07/17 0800 (!) 111/52 98.6 F (37 C) Oral 79 20 - - -  01/07/17 0646 99/62 - - 86 - - - -  01/07/17 0645 (!) 96/51 98.6 F (37 C) Oral 76 20 - - -  01/06/17 1800 112/76 - - 80 - - - -  01/06/17 1633 101/60 - - 72 - - - -  01/06/17 1200 108/66 - - 62 - - - -    Constitutional: Well-developed, well-nourished female in no acute distress.  Cardiovascular: normal rate and rhythm Respiratory: normal effort, lungs CTAB GI: Abd soft, non-tender. Pos BS x 4   Mildly tender throughout, no rebound or guarding MS: Extremities nontender, no edema, normal ROM Neurologic: Alert and oriented x 4.  GU: Neg CVAT.  PELVIC EXAM: Cervix pink, visually closed, without lesion, scant white creamy discharge, vaginal walls and external genitalia normal    Wet prep obtained, as were GC/Chlamydia probes  FHT 156 by doppler  LAB RESULTS Results for orders placed or performed during the hospital encounter of 01/05/17 (from the past 24 hour(s))  Urinalysis, Routine w reflex microscopic     Status: Abnormal   Collection Time: 01/07/17  9:11 AM  Result Value Ref Range   Color, Urine YELLOW YELLOW   APPearance CLEAR CLEAR   Specific Gravity, Urine 1.012 1.005 - 1.030   pH 7.0 5.0 - 8.0   Glucose, UA NEGATIVE NEGATIVE mg/dL   Hgb urine dipstick NEGATIVE NEGATIVE   Bilirubin Urine NEGATIVE NEGATIVE   Ketones, ur NEGATIVE NEGATIVE mg/dL   Protein, ur NEGATIVE NEGATIVE mg/dL   Nitrite NEGATIVE NEGATIVE   Leukocytes, UA TRACE (A) NEGATIVE   RBC / HPF 0-5 0 - 5 RBC/hpf   WBC, UA 0-5 0 - 5 WBC/hpf   Bacteria, UA RARE (A) NONE SEEN   Squamous Epithelial / LPF 0-5 (A) NONE SEEN   Mucous PRESENT   Wet prep, genital     Status: Abnormal   Collection Time: 01/07/17  9:50 AM  Result Value Ref Range   Yeast Wet Prep HPF POC NONE SEEN NONE SEEN   Trich, Wet Prep NONE SEEN NONE SEEN   Clue Cells Wet Prep HPF POC NONE SEEN NONE SEEN   WBC, Wet Prep HPF POC FEW (A) NONE SEEN   Sperm NONE SEEN   CBC     Status: Abnormal   Collection Time: 01/07/17 10:36 AM  Result Value Ref Range   WBC 11.5 (H) 4.0 - 10.5 K/uL   RBC 3.41 (L) 3.87 - 5.11 MIL/uL   Hemoglobin 11.4 (L) 12.0 - 15.0 g/dL   HCT 30.8 (L) 65.7 - 84.6 %   MCV 97.7 78.0 - 100.0 fL   MCH 33.4 26.0 - 34.0 pg   MCHC  34.2 30.0 - 36.0 g/dL   RDW 96.2 95.2 - 84.1 %   Platelets 200 150 - 400 K/uL       IMAGING US Ob Comp Less 14 Wks  Result Date: 01/06/2017 CLINICAL DATA:  First trimester pregnancy. Unsure of LMP. Major depressive disorder with psychotic features. EXAM: OBSTETRIC <14 WK [place] AND  TRANSVAGINAL OB US TECHNIQUE: Both transabdominal and transvaginal ultrasound examinations were performed for complete evaluation of the gestation as well as the maternal uterus, adnexal regions, and pelvic cul-de-sac. Transvaginal technique was performed to assess early pregnancy. COMPARISON:  None. FINDINGS: Intrauterine gestational sac: Single Yolk sac:  Visualized. Embryo:  Visualized. Cardiac Activity: Visualized. Heart Rate: 168  bpm CRL:  71  mm   13 w   2 d                  US EDC: 07/12/2017 Subchorionic hemorrhage:  None visualized. Maternal uterus/adnexae: Normal appearance of both ovaries. No mass or free fluid identified . IMPRESSION: Single living IUP measuring 13 weeks 2 days, with US EDC of 07/12/2017. No significant maternal uterine or adnexal abnormality identified. Electronically Signed   By: Myles RosenthalJohn  Stahl M.D.   On: 01/06/2017 12:21   [place] Ob Transvaginal  Result Date: 01/06/2017 CLINICAL DATA:  First trimester pregnancy. Unsure of LMP. Major depressive disorder with psychotic features. EXAM: OBSTETRIC <14 WK US AND TRANSVAGINAL OB US TECHNIQUE: Both transabdominal and transvaginal ultrasound examinations were performed for complete evaluation of the gestation as well as the maternal uterus, adnexal regions, and pelvic cul-de-sac. Transvaginal technique was performed to assess early pregnancy. COMPARISON:  None. FINDINGS: Intrauterine gestational sac: Single Yolk sac:  Visualized. Embryo:  Visualized. Cardiac Activity: Visualized. Heart Rate: 168  bpm CRL:  71  mm   13 w   2 d                  US EDC: 07/12/2017 Subchorionic hemorrhage:  None visualized. Maternal uterus/adnexae: Normal appearance of both ovaries.  No mass or free fluid identified . IMPRESSION: Single living IUP measuring 13 weeks 2 days, with US EDC of 07/12/2017. No significant maternal uterine or adnexal abnormality identified. Electronically Signed   By: Myles RosenthalJohn  Stahl M.D.   On: 01/06/2017 12:21    MAU Management/MDM: Ordered CBC to rule out acute infection and cultures to evaluate discharge/odor  WBC was normal, which effectively rules out appendicitis and acute abdominal infection Fetal heart rate is normal per doppler. No vaginal bleeding Abdominal pain is of unclear origin, likely GI gas pains or uterus stretching which can cause some discomfort. UA normal, so doubt UTI/pyelo. Wet prep negative.  Patient insists she has BV, discussed we cannot treat if test is negative. Patient is very angry about this.  States she wants "some monistat or something".. Has no itching.  Discussed if she itches we can treat with cream, declines.  Insists on antibiotic for BV.  Discussed with Dr Macon LargeAnyanwu, who affirms we should not treat since wet prep is negative  Consult Dr Macon LargeAnyanwu with presentation, exam findings, and results.       ASSESSMENT 1. Vaginal discharge during pregnancy in second trimester   2. Pregnancy   3. Vaginal odor   4. Generalized abdominal pain     PLAN [facility]charge home [facility] She is stable from an OB standpoint.  Follow-up Information    [facility] Follow up on 01/20/2017.   Specialty:  Obstetrics and Gynecology Why:  your appt is at 2 pm Contact information: [address] [place] [phone]         Pt stable at time of discharge. Encouraged to return here or to other Urgent Care/ED if she develops worsening of symptoms, increase in pain, fever, or other concerning symptoms.    Wynelle BourgeoisMarie Kasch Borquez CNM, MSN  Certified Nurse-Midwife 01/07/2017  11:40 AM

## 2017-01-07 NOTE — Progress Notes (Addendum)
Patient c/o worsening lower generalized abdominal cramping.  Reports duration x3 days, states it is worse today 8/10.  Describes pain as sharp and states "It feels like I'm in labor"  States pain is intermittent "about every 10 minutes", no relieving factors.  BP borderline low.  Denies bleeding.  Reports ongoing white foul discharge.  Abdomen tender on palpation RLQ and LLQ.  Soft.   Reported LBM this AM and passing gas.  Reviewed VS with MD, notified MD of assessment findings.  Order to send patient to [facility] via [place] for assessment.  Report called to Consulting civil engineerCharge RN.  Sent via [place] with sitter.

## 2017-01-07 NOTE — Progress Notes (Addendum)
D: Pt at the time of assessment was alert and oriented x4. Pt at the time endorsed moderate anxiety and depression. Pt denied pain, SI, HI or AVH. Pt was flat however; observed interaction with peers. A: Medications offered as prescribed. All patient's questions and concerns addressed. Support, encouragement, and safe environment provided. 15-minute safety checks continue. R: Pt was med compliant.  Pt attended Karaoke group. Safety checks continue.

## 2017-01-07 NOTE — Progress Notes (Signed)
Adult Psychoeducational Group Note  Date:  01/07/2017 Time:  9:38 PM  Group Topic/Focus:  Wrap-Up Group:   The focus of this group is to help patients review their daily goal of treatment and discuss progress on daily workbooks.  Participation Level:  Active  Participation Quality:  Appropriate  Affect:  Appropriate  Cognitive:  Alert and Appropriate  Insight: Appropriate and Good  Engagement in Group:  Engaged  Modes of Intervention:  Discussion  Additional Comments:  Patient stated her goal for today was to be strong and willing and to interact with peers. Patient stated she felt alright when she achieved her goal. Patient rated her day overall 7 out of 10. Patient stated something positive that happen today was she interact with her peers. Patient stated her goal for tomorrow was to walk more and become active.  Susan FurnaceChristopher  Susan [name] 01/07/2017, 9:38 PM

## 2017-01-07 NOTE — [facility] Group Notes (Signed)
[facility] LCSW Group Therapy  01/07/2017  1:05 PM  Type of Therapy:  Group therapy  Participation Level:  Active  Participation Quality:  Attentive  Affect:  Flat  Cognitive:  Oriented  Insight:  Limited  Engagement in Therapy:  Limited  Modes of Intervention:  Discussion, Socialization  Summary of Progress/Problems:  Chaplain was here to lead a group on themes of hope and courage.  "Courage is what you need when you leave here.  It's about dealing with people, places and things.  I think this is a place that is helping me get strong for when I leave."  Ida Rogueorth, Luva Metzger B 01/07/2017 1:44 PM

## 2017-01-07 NOTE — Progress Notes (Signed)
Patient was seen earlier today at [facility]  Was [facility] by error instead of being transferred back to unit.   Unable to move patient back to floor in computer so patient was entered in as a new admission.  See previous encounter for full admission assessment and notes.

## 2017-01-08 DIAGNOSIS — F323 Major depressive disorder, single episode, severe with psychotic features: Secondary | ICD-10-CM

## 2017-01-08 LAB — CULTURE, OB URINE

## 2017-01-08 LAB — HIV ANTIBODY (ROUTINE TESTING W REFLEX): HIV Screen 4th Generation wRfx: NONREACTIVE

## 2017-01-08 MED ORDER — DIPHENHYDRAMINE HCL 25 MG PO CAPS
25.0000 mg | ORAL_CAPSULE | Freq: Three times a day (TID) | ORAL | Status: DC | PRN
  Filled 2017-01-08 (×3): qty 1

## 2017-01-08 MED ORDER — DIPHENHYDRAMINE HCL 25 MG PO CAPS
50.0000 mg | ORAL_CAPSULE | Freq: Every evening | ORAL | Status: DC | PRN
  Administered 2017-01-08 – 2017-01-09 (×2): 50 mg via ORAL
  Filled 2017-01-08: qty 2

## 2017-01-08 NOTE — Progress Notes (Signed)
D: Pt at the time of assessment was alert and oriented x4. Pt at the time endorsed moderate anxiety, depression and some agitation; "They keep saying I can't get this and that because I'm pregnant." Pt denied pain, SI, HI or AVH. Pt observed interaction with peers. A: Medications offered as prescribed. All patient's questions and concerns addressed. Support, encouragement, and safe environment provided. 15-minute safety checks continue. R: Pt was med compliant.  Pt attended wrap-up group. Safety checks continue.

## 2017-01-08 NOTE — Progress Notes (Signed)
[facility]  01/08/2017 2:58 PM Susan [name]  MRN:  [id number] Subjective:  Patient states " I am ok."  Objective:Patient seen and chart reviewed.Discussed patient with treatment team.  Pt who has a hx of depression , polysubstance abuse , is pregnant ( first trimester) , today reports some improvement in her sx. She reports her AH is improving , however sleep continues to be restless. She is motivated to quit using drugs and wants to take care of his new baby . She has had 4 other children - they are all with their fathers ( different ones) and one of them has been given away to foster care. She reports today that she does not want to be on prozac, but will continue zyprexa. Per RN , no new concerns.    Principal Problem: MDD (major depressive disorder), single episode, severe with psychotic features (HCC) Diagnosis:   Patient Active Problem List   Diagnosis Date Noted  . MDD (major depressive disorder), single episode, severe with psychotic features (HCC) [F32.3] 01/05/2017  . Alcohol use disorder, severe, dependence (HCC) [F10.20] 01/05/2017  . Cocaine use disorder, moderate, dependence (HCC) [F14.20] 01/05/2017  . Cannabis use disorder, moderate, dependence (HCC) [F12.20] 01/05/2017  . Pregnancy [Z34.90] 01/05/2017  . Heavy smoker [F17.200] 02/03/2016  . Cocaine use [F14.90] 02/03/2016  . Chronic, continuous use of opioids [F11.90] 03/24/2015  . Depression with anxiety [F41.8] 03/24/2015  . Poor dentition [K08.9] 03/24/2015  . CLOSED FRACTURE DISTAL PHALANX OR PHALANGES HAND [S62.639A] 06/05/2009  . Sprain of ankle, unspecified site [S93.409A] 05/28/2009   Total Time spent with patient: 20 minutes  Past Psychiatric History: Please see H&P.   Past Medical History:  Past Medical History:  Diagnosis Date  . Alcohol abuse   . Asthma   . BV (bacterial vaginosis) 03/06/2013  . Chronic dental pain   . Chronic pelvic pain in female   . Depression   . Ectopic pregnancy    . Panic attacks   . Polysubstance abuse    cocaine, opiates, marijuana  . Trichomonas vaginitis     Past Surgical History:  Procedure Laterality Date  . ECTOPIC PREGNANCY SURGERY     Family History:  Family History  Problem Relation Age of Onset  . COPD Mother   . Stroke Mother   . Heart disease Maternal Grandmother   . Hypertension Maternal Grandmother   . Stroke Maternal Grandfather   . Asthma Sister   . Down syndrome Sister    Family Psychiatric  History: Please see H&P.  Social History: Please see H&P.  History  Alcohol Use  . Yes    Comment: occasionally     History  Drug Use  . Types: Marijuana, Cocaine    Comment: 1 week ago cocaine    Social History   Social History  . Marital status: Single    Spouse name: N/A  . Number of children: N/A  . Years of education: N/A   Social History Main Topics  . Smoking status: Current Every Day Smoker    Packs/day: 1.00    Types: Cigarettes  . Smokeless tobacco: Never Used  . Alcohol use Yes     Comment: occasionally  . Drug use: Yes    Types: Marijuana, Cocaine     Comment: 1 week ago cocaine  . Sexual activity: Yes    Birth control/ protection: None     Comment: vasectomy   Other Topics Concern  . Not on file   Social History  Narrative  . No narrative on file   Additional Social History:                         Sleep: restless  Appetite:  improving  Current Medications: Current Facility-Administered Medications  Medication Dose Route Frequency Provider Last Rate Last Dose  . acetaminophen (TYLENOL) tablet 650 mg  650 mg Oral Q6H PRN Nelly Rout, MD      . alum & mag hydroxide-simeth (MAALOX/MYLANTA) 200-200-20 MG/5ML suspension 30 mL  30 mL Oral Q4H PRN Nelly Rout, MD   30 mL at 01/07/17 2116  . diphenhydrAMINE (BENADRYL) capsule 25 mg  25 mg Oral TID PRN Jomarie Longs, MD      . diphenhydrAMINE (BENADRYL) capsule 50 mg  50 mg Oral QHS PRN Dajon Rowe, MD      . magnesium  hydroxide (MILK OF MAGNESIA) suspension 30 mL  30 mL Oral Daily PRN Nelly Rout, MD      . OLANZapine (ZYPREXA) tablet 7.5 mg  7.5 mg Oral QHS Nelly Rout, MD   7.5 mg at 01/07/17 2115  . ondansetron (ZOFRAN) tablet 4 mg  4 mg Oral Q8H PRN Nelly Rout, MD        Lab Results:  Results for orders placed or performed during the hospital encounter of 01/05/17 (from the past 48 hour(s))  Urinalysis, Routine w reflex microscopic     Status: Abnormal   Collection Time: 01/07/17  9:11 AM  Result Value Ref Range   Color, Urine YELLOW YELLOW   APPearance CLEAR CLEAR   Specific Gravity, Urine 1.012 1.005 - 1.030   pH 7.0 5.0 - 8.0   Glucose, UA NEGATIVE NEGATIVE mg/dL   Hgb urine dipstick NEGATIVE NEGATIVE   Bilirubin Urine NEGATIVE NEGATIVE   Ketones, ur NEGATIVE NEGATIVE mg/dL   Protein, ur NEGATIVE NEGATIVE mg/dL   Nitrite NEGATIVE NEGATIVE   Leukocytes, UA TRACE (A) NEGATIVE   RBC / HPF 0-5 0 - 5 RBC/hpf   WBC, UA 0-5 0 - 5 WBC/hpf   Bacteria, UA RARE (A) NONE SEEN   Squamous Epithelial / LPF 0-5 (A) NONE SEEN   Mucous PRESENT   Wet prep, genital     Status: Abnormal   Collection Time: 01/07/17  9:50 AM  Result Value Ref Range   Yeast Wet Prep HPF POC NONE SEEN NONE SEEN   Trich, Wet Prep NONE SEEN NONE SEEN   Clue Cells Wet Prep HPF POC NONE SEEN NONE SEEN   WBC, Wet Prep HPF POC FEW (A) NONE SEEN    Comment: MODERATE BACTERIA SEEN   Sperm NONE SEEN   HIV antibody (routine testing) (NOT for [facility])     Status: None   Collection Time: 01/07/17 10:36 AM  Result Value Ref Range   HIV Screen 4th Generation wRfx Non Reactive Non Reactive    Comment: (NOTE) Performed At: [facility] [address] [place], [place] [id number] Mila Homer MD WU:[id number]   CBC     Status: Abnormal   Collection Time: 01/07/17 10:36 AM  Result Value Ref Range   WBC 11.5 (H) 4.0 - 10.5 K/uL   RBC 3.41 (L) 3.87 - 5.11 MIL/uL   Hemoglobin 11.4 (L) 12.0 - 15.0 g/dL   HCT 95.6 (L)  21.3 - 46.0 %   MCV 97.7 78.0 - 100.0 fL   MCH 33.4 26.0 - 34.0 pg   MCHC 34.2 30.0 - 36.0 g/dL   RDW 08.6 57.8 -  15.5 %   Platelets 200 150 - 400 K/uL    Blood Alcohol level:  Lab Results  Component Value Date   ETH 152 (H) 01/04/2017   ETH 341 (HH) 11/07/2016    Metabolic Disorder Labs: Lab Results  Component Value Date   HGBA1C 5.3 01/06/2017   MPG 105 01/06/2017   Lab Results  Component Value Date   PROLACTIN 54.3 (H) 01/06/2017   Lab Results  Component Value Date   CHOL 125 01/06/2017   TRIG 185 (H) 01/06/2017   HDL 65 01/06/2017   CHOLHDL 1.9 01/06/2017   VLDL 37 01/06/2017   LDLCALC 23 01/06/2017    Physical Findings: AIMS:  , ,  ,  ,    CIWA:    COWS:     Musculoskeletal: Strength & Muscle Tone: within normal limits Gait & Station: normal Patient leans: N/A  Psychiatric Specialty Exam: Physical Exam  Nursing note and vitals reviewed.   Review of Systems  Psychiatric/Behavioral: Positive for substance abuse. The patient is nervous/anxious.   All other systems reviewed and are negative.   Blood pressure (!) 94/54, pulse 87, temperature 97.7 F (36.5 C), temperature source Oral, resp. rate 20, unknown if currently breastfeeding.There is no height or weight on file to calculate BMI.  General Appearance: Fairly Groomed  Eye Contact:  Fair  Speech:  Normal Rate  Volume:  Normal  Mood:  Anxious  Affect:  Congruent  Thought Process:  Goal Directed and Descriptions of Associations: Intact  Orientation:  Full (Time, Place, and Person)  Thought Content:  Hallucinations: Auditory and Rumination  Suicidal Thoughts:  No  Homicidal Thoughts:  No  Memory:  Immediate;   Fair Recent;   Fair Remote;   Fair  Judgement:  Impaired  Insight:  Shallow  Psychomotor Activity:  Normal  Concentration:  Concentration: Fair and Attention Span: Fair  Recall:  [employer] of Knowledge:  Fair  Language:  Fair  Akathisia:  No  Handed:  Right  AIMS (if indicated):      Assets:  Communication Skills Desire for Improvement  ADL's:  Intact  Cognition:  WNL  Sleep:  Number of Hours: 6     Treatment Plan Summary:Patient with depression and psychosis, polysubstance abuse - is in her first trimester of pregnancy , will continue treatment and observe on the unit , some progress seen. Daily contact with patient to assess and evaluate symptoms and progress in treatment, Medication management and Plan see below   Continue Zyprexa 7.5 mg po qhs for mood sx, psychosis. Discontinue Prozac for affective sx - when patient was educated about potential risk during pregnancy as well as teratogenic effects , she declines treatment with prozac. Continue Benadryl 50 mg po qhs prn for insomnia. Continue to support , pt is motivated to stay away from substances .   Deseree Zemaitis, MD 01/08/2017, 2:58 PM

## 2017-01-08 NOTE — Progress Notes (Signed)
Nursing Progress Note: 7p-7a D: Pt currently presents with a pleasant affect and behavior. Pt states "I had a good day. I have come to terms to accept my situation and make the best of things." Interacting appropriately with milieu. Pt reports good sleep with current medication regimen.   A: Pt provided with medications per providers orders. Pt's labs and vitals were monitored throughout the night. Pt supported emotionally and encouraged to express concerns and questions. Pt educated on medications.  R: Pt's safety ensured with 15 minute and environmental checks. Pt currently denies SI/HI/Self Harm and AVH. Pt verbally contracts to seek staff if SI/HI or A/VH occurs and to consult with staff before acting on any harmful thoughts. Will continue to monitor.

## 2017-01-08 NOTE — Progress Notes (Signed)
Adult Psychoeducational Group Note  Date:  01/08/2017 Time:  9:48 PM  Group Topic/Focus:  Wrap-Up Group:   The focus of this group is to help patients review their daily goal of treatment and discuss progress on daily workbooks.  Participation Level:  Active  Participation Quality:  Appropriate and Attentive  Affect:  Appropriate  Cognitive:  Alert and Appropriate  Insight: Appropriate and Improving  Engagement in Group:  Engaged  Modes of Intervention:  Discussion  Additional Comments:  Patient stated her goal for today was to interact more with peers. Patient stated she felt okay when she achieved her goal. Patient rated her day 8 out of 10. Patient stated something positive that happened today was she didn't stay in bed half the day. Patient stated her goal for tomorrow was not getting depressed at all.  Felipa FurnaceChristopher  Jacquese Cassarino 01/08/2017, 9:48 PM

## 2017-01-08 NOTE — Progress Notes (Signed)
Patient denies SI, HI and AVH.  Patient reported feeling better since she has been on medications and plans to continue with her plan for sobriety upon discharge.  Patient has attended groups and been compliant with medications.  Patient has had no incidents of behavioral dyscontrol.   Assess patient for safety, offer medications as prescribed, engage patient in 1:1 staff talks.   Continue to monitor as prescribed.

## 2017-01-08 NOTE — [facility] Group Notes (Signed)
[facility] Group Notes:  (Clinical Social Work)  01/08/2017  11:15-12:00PM  Summary of Progress/Problems:   Today's process group involved patients discussing their feelings related to being hospitalized, as well as benefits they see to being in the hospital.  The group brainstormed specific ways in which they could seek for those same benefits to happen when they discharge and go back home. The patient expressed a primary feeling about being hospitalized is good because it is helping her to get her mind back together, stating "it was haywire because everything around me was chaos" so this gave her a chance to concentrate on herself.  To stay well and out of the hospital she is going to start working with a Secondary school teachereer Support Specialist from [employer] [facility] [place].  Type of Therapy:  Group Therapy - Process  Participation Level:  Active  Participation Quality:  Attentive  Affect:  Blunted  Cognitive:  Appropriate  Insight:  Developing/Improving  Engagement in Therapy:  Developing/Improving  Modes of Intervention:  Exploration, Discussion  Ambrose MantleMareida Grossman-Orr, LCSW 01/08/2017, 12:47 PM

## 2017-01-09 MED ORDER — PRENATAL MULTIVITAMIN CH
1.0000 | ORAL_TABLET | Freq: Every day | ORAL | Status: DC
  Administered 2017-01-09 – 2017-01-10 (×3): 1 via ORAL
  Filled 2017-01-09 (×3): qty 1

## 2017-01-09 NOTE — Progress Notes (Signed)
Nursing Progress Note: 7p-7a D: Pt currently presents with a anxious affect and behavior. Pt states "I feel good today after the doctor straightened out my meds." Interacting fair with milieu. Pt reports good sleep with current medication regimen.   A: Pt provided with medications per providers orders. Pt's labs and vitals were monitored throughout the night. Pt supported emotionally and encouraged to express concerns and questions. Pt educated on medications.  R: Pt's safety ensured with 15 minute and environmental checks. Pt currently denies SI/HI/Self Harm and AVH. Pt verbally contracts to seek staff if SI/HI or A/VH occurs and to consult with staff before acting on any harmful thoughts. Will continue to monitor.

## 2017-01-09 NOTE — [facility] Group Notes (Signed)
[facility] Group Notes:  (Clinical Social Work)  01/09/2017  11:00AM-12:00PM  Summary of Progress/Problems:  The main focus of today's process group was to listen to a variety of genres of music and to identify that different types of music provoke different responses.  The patient then was able to identify personally what was soothing for them, as well as energizing, as well as how patient can personally use this knowledge in sleep habits, with depression, and with other symptoms.  The patient expressed at the beginning of group the overall feeling of "up and down" in her mood.  She sang along to much of the music, smiled at times, just listened at other times.  She stated at the end of group that the "down" part of her up and down mood was gone.  Type of Therapy:  Music Therapy   Participation Level:  Active  Participation Quality:  Attentive and Sharing  Affect:  Blunted  Cognitive:  Oriented  Insight:  Engaged  Engagement in Therapy:  Engaged  Modes of Intervention:   Activity, Exploration  Ambrose MantleMareida Grossman-Orr, LCSW 01/09/2017

## 2017-01-09 NOTE — Progress Notes (Signed)
[facility]  01/09/2017 2:32 PM Susan [name]  MRN:  [id number] Subjective: Pt states " I am fine."   Objective: Patient seen and chart reviewed.Discussed patient with treatment team.  Pt today seen as making progress. she continues to tolerate zyprexa, denies side effects. Reports sleep is improving. Per RN , no new concerns. Continue treatment.    Principal Problem: MDD (major depressive disorder), single episode, severe with psychotic features (HCC) Diagnosis:   Patient Active Problem List   Diagnosis Date Noted  . MDD (major depressive disorder), single episode, severe with psychotic features (HCC) [F32.3] 01/05/2017  . Alcohol use disorder, severe, dependence (HCC) [F10.20] 01/05/2017  . Cocaine use disorder, moderate, dependence (HCC) [F14.20] 01/05/2017  . Cannabis use disorder, moderate, dependence (HCC) [F12.20] 01/05/2017  . Pregnancy [Z34.90] 01/05/2017  . Heavy smoker [F17.200] 02/03/2016  . Cocaine use [F14.90] 02/03/2016  . Chronic, continuous use of opioids [F11.90] 03/24/2015  . Depression with anxiety [F41.8] 03/24/2015  . Poor dentition [K08.9] 03/24/2015  . CLOSED FRACTURE DISTAL PHALANX OR PHALANGES HAND [S62.639A] 06/05/2009  . Sprain of ankle, unspecified site [S93.409A] 05/28/2009   Total Time spent with patient: 20 minutes  Past Psychiatric History: Please see H&P.   Past Medical History:  Past Medical History:  Diagnosis Date  . Alcohol abuse   . Asthma   . BV (bacterial vaginosis) 03/06/2013  . Chronic dental pain   . Chronic pelvic pain in female   . Depression   . Ectopic pregnancy   . Panic attacks   . Polysubstance abuse    cocaine, opiates, marijuana  . Trichomonas vaginitis     Past Surgical History:  Procedure Laterality Date  . ECTOPIC PREGNANCY SURGERY     Family History:  Family History  Problem Relation Age of Onset  . COPD Mother   . Stroke Mother   . Heart disease Maternal Grandmother   . Hypertension Maternal  Grandmother   . Stroke Maternal Grandfather   . Asthma Sister   . Down syndrome Sister    Family Psychiatric  History: Please see H&P.  Social History: Please see H&P.  History  Alcohol Use  . Yes    Comment: occasionally     History  Drug Use  . Types: Marijuana, Cocaine    Comment: 1 week ago cocaine    Social History   Social History  . Marital status: Single    Spouse name: N/A  . Number of children: N/A  . Years of education: N/A   Social History Main Topics  . Smoking status: Current Every Day Smoker    Packs/day: 1.00    Types: Cigarettes  . Smokeless tobacco: Never Used  . Alcohol use Yes     Comment: occasionally  . Drug use: Yes    Types: Marijuana, Cocaine     Comment: 1 week ago cocaine  . Sexual activity: Yes    Birth control/ protection: None     Comment: vasectomy   Other Topics Concern  . Not on file   Social History Narrative  . No narrative on file   Additional Social History:                         Sleep: Fair  Appetite:  Fair  Current Medications: Current Facility-Administered Medications  Medication Dose Route Frequency Provider Last Rate Last Dose  . acetaminophen (TYLENOL) tablet 650 mg  650 mg Oral Q6H PRN Nelly RoutKumar, Archana, MD      .  alum & mag hydroxide-simeth (MAALOX/MYLANTA) 200-200-20 MG/5ML suspension 30 mL  30 mL Oral Q4H PRN Nelly Rout, MD   30 mL at 01/07/17 2116  . diphenhydrAMINE (BENADRYL) capsule 25 mg  25 mg Oral TID PRN Jomarie Longs, MD      . diphenhydrAMINE (BENADRYL) capsule 50 mg  50 mg Oral QHS PRN Jomarie Longs, MD   50 mg at 01/08/17 2117  . magnesium hydroxide (MILK OF MAGNESIA) suspension 30 mL  30 mL Oral Daily PRN Nelly Rout, MD   30 mL at 01/08/17 2117  . OLANZapine (ZYPREXA) tablet 7.5 mg  7.5 mg Oral QHS Nelly Rout, MD   7.5 mg at 01/08/17 2117  . ondansetron (ZOFRAN) tablet 4 mg  4 mg Oral Q8H PRN Nelly Rout, MD      . prenatal multivitamin tablet 1 tablet  1 tablet Oral  Q1200 Jomarie Longs, MD   1 tablet at 01/09/17 1203    Lab Results:  No results found for this or any previous visit (from the past 48 hour(s)).  Blood Alcohol level:  Lab Results  Component Value Date   ETH 152 (H) 01/04/2017   ETH 341 (HH) 11/07/2016    Metabolic Disorder Labs: Lab Results  Component Value Date   HGBA1C 5.3 01/06/2017   MPG 105 01/06/2017   Lab Results  Component Value Date   PROLACTIN 54.3 (H) 01/06/2017   Lab Results  Component Value Date   CHOL 125 01/06/2017   TRIG 185 (H) 01/06/2017   HDL 65 01/06/2017   CHOLHDL 1.9 01/06/2017   VLDL 37 01/06/2017   LDLCALC 23 01/06/2017    Physical Findings: AIMS:  , ,  ,  ,    CIWA:    COWS:     Musculoskeletal: Strength & Muscle Tone: within normal limits Gait & Station: normal Patient leans: N/A  Psychiatric Specialty Exam: Physical Exam  Nursing note and vitals reviewed.   Review of Systems  Gastrointestinal: Positive for nausea (on and off ).  Psychiatric/Behavioral: Positive for substance abuse. The patient is nervous/anxious.   All other systems reviewed and are negative.   Blood pressure (!) 100/55, pulse 82, temperature 98.7 F (37.1 C), temperature source Oral, resp. rate 16, unknown if currently breastfeeding.There is no height or weight on file to calculate BMI.  General Appearance: Fairly Groomed  Eye Contact:  Fair  Speech:  Normal Rate  Volume:  Normal  Mood:  Anxious improving  Affect:  Congruent  Thought Process:  Goal Directed and Descriptions of Associations: Intact  Orientation:  Full (Time, Place, and Person)  Thought Content:  Hallucinations: Auditory and Rumination, improving  Suicidal Thoughts:  No  Homicidal Thoughts:  No  Memory:  Immediate;   Fair Recent;   Fair Remote;   Fair  Judgement:  Fair  Insight:  Fair  Psychomotor Activity:  Normal  Concentration:  Concentration: Fair and Attention Span: Fair  Recall:  [employer] of Knowledge:  Fair  Language:   Fair  Akathisia:  No  Handed:  Right  AIMS (if indicated):     Assets:  Communication Skills Desire for Improvement  ADL's:  Intact  Cognition:  WNL  Sleep:  Number of Hours: 6.75   Will continue today 01/09/17 plan as below except where it is noted.   Treatment Plan Summary:Patient with depression and psychosis, polysubstance abuse , in her first trimester , continues to improve.  Daily contact with patient to assess and evaluate symptoms and progress in treatment,  Medication management and Plan see below   Continue Zyprexa 7.5 mg po qhs for mood sx, psychosis. Discontinue Prozac for affective sx - when patient was educated about potential risk during pregnancy as well as teratogenic effects , she declines treatment with prozac. Continue Benadryl 50 mg po qhs prn for insomnia. Continue to support , pt is motivated to stay away from substances .   Phillips Goulette, MD 01/09/2017, 2:32 PM

## 2017-01-09 NOTE — Progress Notes (Signed)
Patient ID: Susan [name], female   DOB: April 03, 1976, 41 y.o.   MRN: [id number]    D: Pt has been appropriate on the unit today, she has attended all groups and engaged in treatment. Pt reported that her depression was a 5, her hopelessness was a 4, and her anxiety was a 5. Pt reported that her goal for today was to be strong and not give up.  Pt reported being negative SI/HI, no AH/VH noted. A: 15 min checks continued for patient safety. R: Pt safety maintained.

## 2017-01-10 ENCOUNTER — Encounter (HOSPITAL_COMMUNITY): Payer: Self-pay

## 2017-01-10 LAB — GC/CHLAMYDIA PROBE AMP (~~LOC~~) NOT AT ARMC
Chlamydia: NEGATIVE
Neisseria Gonorrhea: NEGATIVE

## 2017-01-10 MED ORDER — PRENATAL MULTIVITAMIN CH: 1.0000 | ORAL_TABLET | Freq: Every day | ORAL | Status: DC

## 2017-01-10 MED ORDER — PRENATAL MULTIVITAMIN CH: 1.0000 | ORAL_TABLET | Freq: Every day | ORAL | 0 refills | Status: DC

## 2017-01-10 MED ORDER — DIPHENHYDRAMINE HCL 25 MG PO CAPS: ORAL_CAPSULE | ORAL | 0 refills | Status: DC

## 2017-01-10 MED ORDER — OLANZAPINE 7.5 MG PO TABS: 7.5000 mg | ORAL_TABLET | Freq: Every day | ORAL | 0 refills | Status: DC

## 2017-01-10 NOTE — Discharge Summary (Signed)
Physician Discharge Summary Note  Patient:  Susan [name] is an 41 y.o., female MRN:  [id number] DOB:  [birth year] Patient phone:  [phone] (home)  Patient address:   [address] [place] [address],  Total Time spent with patient: Greater than 30 minutes.  Date of Admission:  01/07/2017 Date of Discharge: 01-10-17  Reason for Admission: Worsening symptoms of depression with psychotic symptoms, Alcohol use disorder, Cocaine use disorder, Cannabis use disorder.  Principal Problem: MDD (major depressive disorder), single episode, severe with psychotic features [facility])  Discharge Diagnoses: Patient Active Problem List   Diagnosis Date Noted  . MDD (major depressive disorder), single episode, severe with psychotic features (HCC) [F32.3] 01/05/2017  . Alcohol use disorder, severe, dependence (HCC) [F10.20] 01/05/2017  . Cocaine use disorder, moderate, dependence (HCC) [F14.20] 01/05/2017  . Cannabis use disorder, moderate, dependence (HCC) [F12.20] 01/05/2017  . Pregnancy [Z34.90] 01/05/2017  . Heavy smoker [F17.200] 02/03/2016  . Cocaine use [F14.90] 02/03/2016  . Chronic, continuous use of opioids [F11.90] 03/24/2015  . Depression with anxiety [F41.8] 03/24/2015  . Poor dentition [K08.9] 03/24/2015  . CLOSED FRACTURE DISTAL PHALANX OR PHALANGES HAND [S62.639A] 06/05/2009  . Sprain of ankle, unspecified site [S93.409A] 05/28/2009   Past Psychiatric History: Major depressive disorder, Alcohol use disorder, Cocaine use disorder.  Past Medical History:  Past Medical History:  Diagnosis Date  . Alcohol abuse   . Asthma   . BV (bacterial vaginosis) 03/06/2013  . Chronic dental pain   . Chronic pelvic pain in female   . Depression   . Ectopic pregnancy   . Panic attacks   . Polysubstance abuse    cocaine, opiates, marijuana  . Trichomonas vaginitis     Past Surgical History:  Procedure Laterality Date  . ECTOPIC PREGNANCY SURGERY     Family History:  Family History   Problem Relation Age of Onset  . COPD Mother   . Stroke Mother   . Heart disease Maternal Grandmother   . Hypertension Maternal Grandmother   . Stroke Maternal Grandfather   . Asthma Sister   . Down syndrome Sister    Family Psychiatric  History: See H&P  Social History:  History  Alcohol Use  . Yes    Comment: occasionally     History  Drug Use  . Types: Marijuana, Cocaine    Comment: 1 week ago cocaine    Social History   Social History  . Marital status: Single    Spouse name: N/A  . Number of children: N/A  . Years of education: N/A   Social History Main Topics  . Smoking status: Current Every Day Smoker    Packs/day: 1.00    Types: Cigarettes  . Smokeless tobacco: Never Used  . Alcohol use Yes     Comment: occasionally  . Drug use: Yes    Types: Marijuana, Cocaine     Comment: 1 week ago cocaine  . Sexual activity: Yes    Birth control/ protection: None     Comment: vasectomy   Other Topics Concern  . None   Social History Narrative  . None   Hospital Course: Patient is a 41 year old female who lives with her mother in [place], reports that she struggling with depression and polysubstance use. Patient was admitted on the unit on 01/04/2017, was transferred on 01/07/2017 for an evaluation at [facility] for evaluation of her pregnancy condition due to reports of contractions. Patient was accidentally discharged in the system and requires re-admission. She is pregnant.  After her re-admission assessment & evaluation, it was determined that Susan [name] will need medication management to stabilize her mood. Upon her initial admission to the hospital, a review of her toxicology test result revealed a BAL of 152 & UDS positive for cocaine. However, she was not presenting with any substance withdrawal symptoms. She did not receive any detoxification treatment as a result. However, she was medicated & discharged on Fluoxetine 10 mg for depression, Zyprexa 7.5 mg  for mood control & Prenatal vitamin supplement for the care of her pregnancy. The choice of her medication regimen was made in consideration to the safety of her unborn child.  Besides the mood stabilization treatments, Susan [name] was also enrolled & participated in the group counseling sessions being offered & held on this unit. She learned coping skills. Other than her pregnancy condition, Susan [name] presented no other significant health issues that required treatment. She was closely monitored for her response to therapy & any significant adverse effects related to her treatment regimen.  Nicola's symptoms responded well to her treatment regimen. This is evidenced by her reports of improved symptoms, mood & absence of suicidal ideations. She is currently considered mentally & medically stable to be discharged  To continue both mental health care & prenatal care on an outpatient basis as noted below. She was provided with all the necessary information needed to make this appointments without problems.  Upon discharge, Susan [name] adamantly denies any SIHI, AVH, delusional thoughts, paranoia or substance withdrawal symptoms. She is currently being discharged to her home with family. [facility] in no apparent distress. Transportation per family.  Physical Findings: AIMS:  , ,  ,  ,    CIWA:    COWS:     Musculoskeletal: Strength & Muscle Tone: within normal limits Gait & Station: normal Patient leans: N/A  Psychiatric Specialty Exam: Physical Exam  Constitutional: She is oriented to person, place, and time. She appears well-developed.  Patient is Pregnant.  HENT:  Head: Normocephalic.  Eyes: Pupils are equal, round, and reactive to light.  Neck: Normal range of motion.  Cardiovascular: Normal rate.   Respiratory: Effort normal.  GI: Soft.  Genitourinary:  Genitourinary Comments: Deferred  Musculoskeletal: Normal range of motion.  Neurological: She is alert and  oriented to person, place, and time.  Skin: Skin is warm and dry.    Review of Systems  Constitutional: Negative.   HENT: Negative.   Eyes: Negative.   Respiratory: Negative.   Cardiovascular: Negative.   Gastrointestinal: Negative.   Genitourinary: Negative.   Musculoskeletal: Negative.   Skin: Negative.   Neurological: Negative.   Endo/Heme/Allergies: Negative.   Psychiatric/Behavioral: Positive for depression (Stable) and substance abuse (Hx. Alcohol & cocaine use disorder). Negative for hallucinations, memory loss and suicidal ideas. The patient has insomnia (Stable). The patient is not nervous/anxious.     Blood pressure (!) 94/53, pulse 87, temperature 98.9 F (37.2 C), temperature source Oral, resp. rate 18, unknown if currently breastfeeding.There is no height or weight on file to calculate BMI.  See Md's    Has this patient used any form of tobacco in the last 30 days? (Cigarettes, Smokeless Tobacco, Cigars, and/or Pipes) Yes, No  Blood Alcohol level:  Lab Results  Component Value Date   ETH 152 (H) 01/04/2017   ETH 341 (HH) 11/07/2016   Metabolic Disorder Labs:  Lab Results  Component Value Date   HGBA1C 5.3 01/06/2017   MPG 105 01/06/2017   Lab Results  Component  Value Date   PROLACTIN 54.3 (H) 01/06/2017   Lab Results  Component Value Date   CHOL 125 01/06/2017   TRIG 185 (H) 01/06/2017   HDL 65 01/06/2017   CHOLHDL 1.9 01/06/2017   VLDL 37 01/06/2017   LDLCALC 23 01/06/2017   See Psychiatric Specialty Exam and Suicide Risk Assessment completed by Attending Physician prior to discharge.  Discharge destination:  Home  Is patient on multiple antipsychotic therapies at discharge:  No   Has Patient had three or more failed trials of antipsychotic monotherapy by history:  No  Recommended Plan for Multiple Antipsychotic Therapies: NA  Allergies as of 01/10/2017   No Known Allergies     Medication List    TAKE these medications     Indication   diphenhydrAMINE 25 mg capsule Commonly known as:  BENADRYL Take 1 capsule (25 mg three times daily & 2 capsules (50 mg) at bed time as needed: For anxiety, tremors & sleep.  Indication:  Anxiety, EPS, sleep   OLANZapine 7.5 MG tablet Commonly known as:  ZYPREXA Take 1 tablet (7.5 mg total) by mouth at bedtime. For mood control  Indication:  Mood control   prenatal multivitamin Tabs tablet Take 1 tablet by mouth daily at 12 noon. Prenatal vitamin supplement  Indication:  Pregnancy      Follow-up Information    Services, [facility] Follow up on 01/13/2017.   Why:  Thuirsday at 9:00 for your hospital follow up appointment.  Make sure to call [facility] when you get home to get on their schedule.  Also make sure to go to the NA meeting at the church tonight Contact information: [address] [place] [address] [phone]          Follow-up recommendations:  Activity:  As tolerated Diet: As recommended by your primary care doctor. Keep all scheduled follow-up appointments as recommended.  Comments: Patient is instructed prior to discharge to: Take all medications as prescribed by his/her mental healthcare provider. Report any adverse effects and or reactions from the medicines to his/her outpatient provider promptly. Patient has been instructed & cautioned: To not engage in alcohol and or illegal drug use while on prescription medicines. In the event of worsening symptoms, patient is instructed to call the crisis hotline, 911 and or go to the nearest ED for appropriate evaluation and treatment of symptoms. To follow-up with his/her primary care provider for your other medical issues, concerns and or health care needs.   Signed: Sanjuana Kava, NP, PMHNP, FNP-BC 01/11/2017, 1:00 PM

## 2017-01-10 NOTE — Progress Notes (Signed)
Recreation Therapy Notes  Date: 01/10/17 Time: 1000 Location: [address] Dayroom  Group Topic:  Goal Setting  Goal Area(s) Addresses:  Patient will be able to identify at least 3 life goals.  Patient will be able to identify benefit of investing in goals.  Patient will be able to identify benefit of setting life goals.   Behavioral Response:  Engaged  Intervention: Worksheets, pencils  Activity: Life Goals.  Patients were given a worksheet with six categories (family, friends, work/school, spirituality, body and mental health).  Pt were to say what they were doing well, where they needed to improve and write a goal for how they would make the improvement.  Education:  Discharge Planning, Coping Skills, Life Goals  Education Outcome: Acknowledges Education/In Group Clarification Provided/Needs Additional Education  Clinical Observations:  Pt was engaged during group.  The top three areas the pt shared were family, friends and work/school.  Pt expressed she was doing well with supporting her family, however pt expressed a need to improve communication with them.  Pt identified her goal was to be more open and understanding with her family.  Pt went on to say she does well at encouraging her friends and she needs to talk to them more.  Pt also stated her goal was to be more open to suggestions.  Lastly, for work/school, pt expressed she is doing well at looking for a job.  Pt needs to do better with transportation and made a goal of catching the bus.   Caroll RancherMarjette Khira [place], LRT/CTRS        Caroll RancherLindsay, Johathon Overturf A 01/10/2017 11:52 AM

## 2017-01-10 NOTE — Tx Team (Signed)
Interdisciplinary Treatment and Diagnostic Plan Update  01/10/2017 Time of Session: 11:33 AM  Susan [name] MRN: [id number]  Principal Diagnosis: MDD (major depressive disorder), single episode, severe with psychotic features ([place])  Secondary Diagnoses: Principal Problem:   MDD (major depressive disorder), single episode, severe with psychotic features ([place]) Active Problems:   Alcohol use disorder, severe, dependence ([place])   Cocaine use disorder, moderate, dependence ([place])   Cannabis use disorder, moderate, dependence ([place])   Pregnancy   Current Medications:  Current Facility-Administered Medications  Medication Dose Route Frequency Provider Last Rate Last Dose  . acetaminophen (TYLENOL) tablet 650 mg  650 mg Oral Q6H PRN Hampton Abbot, MD   650 mg at 01/10/17 0746  . alum & mag hydroxide-simeth (MAALOX/MYLANTA) 200-200-20 MG/5ML suspension 30 mL  30 mL Oral Q4H PRN Hampton Abbot, MD   30 mL at 01/07/17 2116  . diphenhydrAMINE (BENADRYL) capsule 25 mg  25 mg Oral TID PRN Ursula Alert, MD      . diphenhydrAMINE (BENADRYL) capsule 50 mg  50 mg Oral QHS PRN Ursula Alert, MD   50 mg at 01/09/17 2113  . magnesium hydroxide (MILK OF MAGNESIA) suspension 30 mL  30 mL Oral Daily PRN Hampton Abbot, MD   30 mL at 01/08/17 2117  . OLANZapine (ZYPREXA) tablet 7.5 mg  7.5 mg Oral QHS Hampton Abbot, MD   7.5 mg at 01/09/17 2113  . ondansetron (ZOFRAN) tablet 4 mg  4 mg Oral Q8H PRN Hampton Abbot, MD      . prenatal multivitamin tablet 1 tablet  1 tablet Oral Q1200 Ursula Alert, MD   1 tablet at 01/10/17 0746    PTA Medications: No prescriptions prior to admission.    Treatment Modalities: Medication Management, Group therapy, Case management,  1 to 1 session with clinician, Psychoeducation, Recreational therapy.   Physician Treatment Plan for Primary Diagnosis: MDD (major depressive disorder), single episode, severe with psychotic features ([place]) Long Term Goal(s): Improvement in  symptoms so as ready for discharge  Short Term Goals: Ability to verbalize feelings will improve Ability to disclose and discuss suicidal ideas Ability to demonstrate self-control will improve Ability to identify and develop effective coping behaviors will improve Ability to identify changes in lifestyle to reduce recurrence of condition will improve Ability to maintain clinical measurements within normal limits will improve Compliance with prescribed medications will improve Ability to identify triggers associated with substance abuse/mental health issues will improve  Medication Management: Evaluate patient's response, side effects, and tolerance of medication regimen.  Therapeutic Interventions: 1 to 1 sessions, Unit Group sessions and Medication administration.  Evaluation of Outcomes: Adequate for Discharge  Physician Treatment Plan for Secondary Diagnosis: Principal Problem:   MDD (major depressive disorder), single episode, severe with psychotic features ([place]) Active Problems:   Alcohol use disorder, severe, dependence ([place])   Cocaine use disorder, moderate, dependence ([place])   Cannabis use disorder, moderate, dependence ([place])   Pregnancy   Long Term Goal(s): Improvement in symptoms so as ready for discharge  Short Term Goals: Ability to verbalize feelings will improve Ability to disclose and discuss suicidal ideas Ability to demonstrate self-control will improve Ability to identify and develop effective coping behaviors will improve Ability to identify changes in lifestyle to reduce recurrence of condition will improve Ability to maintain clinical measurements within normal limits will improve Compliance with prescribed medications will improve Ability to identify triggers associated with substance abuse/mental health issues will improve  Medication Management: Evaluate patient's response, side effects, and tolerance of  medication regimen.  Therapeutic Interventions: 1  to 1 sessions, Unit Group sessions and Medication administration.  Evaluation of Outcomes: Adequate for Discharge   RN Treatment Plan for Primary Diagnosis: MDD (major depressive disorder), single episode, severe with psychotic features ([place]) Long Term Goal(s): Knowledge of disease and therapeutic regimen to maintain health will improve  Short Term Goals: Ability to identify and develop effective coping behaviors will improve and Compliance with prescribed medications will improve  Medication Management: RN will administer medications as ordered by provider, will assess and evaluate patient's response and provide education to patient for prescribed medication. RN will report any adverse and/or side effects to prescribing provider.  Therapeutic Interventions: 1 on 1 counseling sessions, Psychoeducation, Medication administration, Evaluate responses to treatment, Monitor vital signs and CBGs as ordered, Perform/monitor CIWA, COWS, AIMS and Fall Risk screenings as ordered, Perform wound care treatments as ordered.  Evaluation of Outcomes: Adequate for Discharge   LCSW Treatment Plan for Primary Diagnosis: MDD (major depressive disorder), single episode, severe with psychotic features ([place]) Long Term Goal(s): Safe transition to appropriate next level of care at discharge, Engage patient in therapeutic group addressing interpersonal concerns.  Short Term Goals: Engage patient in aftercare planning with referrals and resources  Therapeutic Interventions: Assess for all discharge needs, 1 to 1 time with Social worker, Explore available resources and support systems, Assess for adequacy in community support network, Educate family and significant other(s) on suicide prevention, Complete Psychosocial Assessment, Interpersonal group therapy.  Evaluation of Outcomes: Met  Return home, follow up [facility] and peer support   Progress in Treatment: Attending groups: Yes Participating in groups:  Yes Taking medication as prescribed: Yes Toleration medication: Yes, no side effects reported at this time Family/Significant other contact made: Yes Patient understands diagnosis: Yes AEB paranoia and substance use Discussing patient identified problems/goals with staff: Yes Medical problems stabilized or resolved: Yes Denies suicidal/homicidal ideation: Yes Issues/concerns per patient self-inventory: None Other: N/A  New problem(s) identified: None identified at this time.   New Short Term/Long Term Goal(s): None identified at this time.   Discharge Plan or Barriers:   Reason for Continuation of Hospitalization:  Estimated Length of Stay:D/C today  Attendees: Patient: 01/10/2017  11:33 AM  Physician: Ursula Alert, MD 01/10/2017  11:33 AM  Nursing: Grayland Ormond, RN 01/10/2017  11:33 AM  RN Care Manager: Lars Pinks, RN 01/10/2017  11:33 AM  Social Worker: Ripley Fraise 01/10/2017  11:33 AM  Recreational Therapist: Laretta Bolster  01/10/2017  11:33 AM  Other: Norberto Sorenson 01/10/2017  11:33 AM  Other:  01/10/2017  11:33 AM    Scribe for Treatment Team:  Roque Lias LCSW 01/10/2017 11:33 AM

## 2017-01-10 NOTE — [facility] Suicide Risk Assessment (Signed)
[facility] INPATIENT:  Family/Significant Other Suicide Prevention Education  Suicide Prevention Education:  Education Completed; Danella SensingRhonda Pogosyan, mother, [phone]  has been identified by the patient as the family member/significant other with whom the patient will be residing, and identified as the person(s) who will aid the patient in the event of a mental health crisis (suicidal ideations/suicide attempt).  With written consent from the patient, the family member/significant other has been provided the following suicide prevention education, prior to the and/or following the discharge of the patient.  The suicide prevention education provided includes the following:  Suicide risk factors  Suicide prevention and interventions  National Suicide Hotline telephone number  [facility] assessment telephone number  [facility] Emergency Assistance 911  [facility] and/or Residential Mobile Crisis Unit telephone number  Request made of family/significant other to:  Remove weapons (e.g., guns, rifles, knives), all items previously/currently identified as safety concern.    Remove drugs/medications (over-the-counter, prescriptions, illicit drugs), all items previously/currently identified as a safety concern.  The family member/significant other verbalizes understanding of the suicide prevention education information provided.  The family member/significant other agrees to remove the items of safety concern listed above.  Baldo DaubRodney B [facility] 01/10/2017, 11:30 AM

## 2017-01-10 NOTE — [facility] Suicide Risk Assessment (Signed)
[facility]   Principal Problem: MDD (major depressive disorder), single episode, severe with psychotic features [facility](HCC) Discharge Diagnoses:  Patient Active Problem List   Diagnosis Date Noted  . MDD (major depressive disorder), single episode, severe with psychotic features (HCC) [F32.3] 01/05/2017  . Alcohol use disorder, severe, dependence (HCC) [F10.20] 01/05/2017  . Cocaine use disorder, moderate, dependence (HCC) [F14.20] 01/05/2017  . Cannabis use disorder, moderate, dependence (HCC) [F12.20] 01/05/2017  . Pregnancy [Z34.90] 01/05/2017  . Heavy smoker [F17.200] 02/03/2016  . Cocaine use [F14.90] 02/03/2016  . Chronic, continuous use of opioids [F11.90] 03/24/2015  . Depression with anxiety [F41.8] 03/24/2015  . Poor dentition [K08.9] 03/24/2015  . CLOSED FRACTURE DISTAL PHALANX OR PHALANGES HAND [S62.639A] 06/05/2009  . Sprain of ankle, unspecified site [S93.409A] 05/28/2009    Total Time spent with patient: 30 minutes  Musculoskeletal: Strength & Muscle Tone: within normal limits Gait & Station: normal Patient leans: N/A  Psychiatric Specialty Exam: Review of Systems  Psychiatric/Behavioral: Positive for substance abuse. Negative for depression, hallucinations and suicidal ideas.  All other systems reviewed and are negative.   Blood pressure (!) 94/53, pulse 87, temperature 98.9 F (37.2 C), temperature source Oral, resp. rate 18, unknown if currently breastfeeding.There is no height or weight on file to calculate BMI.  General Appearance: Casual  Eye Contact::  Fair  Speech:  Clear and Coherent409  Volume:  Normal  Mood:  Euthymic  Affect:  Appropriate  Thought Process:  Goal Directed and Descriptions of Associations: Intact  Orientation:  Full (Time, Place, and Person)  Thought Content:  Logical  Suicidal Thoughts:  No  Homicidal Thoughts:  No  Memory:  Immediate;   Fair Recent;   Fair Remote;   Fair  Judgement:  Fair  Insight:   Fair  Psychomotor Activity:  Normal  Concentration:  Fair  Recall:  FiservFair  Fund of Knowledge:Fair  Language: Fair  Akathisia:  No  Handed:  Right  AIMS (if indicated):     Assets:  Communication Skills Desire for Improvement  Sleep:  Number of Hours: 5.5  Cognition: WNL  ADL's:  Intact   Mental Status Per Nursing Assessment::   On Admission:     Demographic Factors:  Caucasian  Loss Factors: NA  Historical Factors: Impulsivity  Risk Reduction Factors:   Living with another person, especially a relative and Positive social support  Continued Clinical Symptoms:  Alcohol/Substance Abuse/Dependencies Previous Psychiatric Diagnoses and Treatments  Cognitive Features That Contribute To Risk:  None    Suicide Risk:  Minimal: No identifiable suicidal ideation.  Patients presenting with no risk factors but with morbid ruminations; may be classified as minimal risk based on the severity of the depressive symptoms  Follow-up Information    [facility] Follow up.   Contact information: [address] [place] [address] [phone]           Plan Of Care/Follow-up recommendations:  Activity:  no restrictions Diet:  regular Tests:  as needed Other:  follow up with aftercare  Truman Aceituno, MD 01/10/2017, 9:47 AM

## 2017-01-10 NOTE — Progress Notes (Addendum)
Nursing Discharge Note 01/10/2017 0700-1610  Data Reports sleeping good with PRN sleep med.  Rates depression 5/10, hopelessness 5/10, and anxiety 5/10. Affect wide ranged and appropriate.  Denies HI, SI, AVH.  C/O rib pain/back pain after breakfast.  Attending groups, appropriate with staff and peers, spends free time in milieu.  Received discharge orders.  Patient states she will try to go to an [place].  Action Spoke with patient 1:1, nurse offered support to patient throughout shift. .  Reviewed medications, discharge instructions, and follow up appointments with patient.  Given PRN's for pain, MD aware of pain. Medication scripts reviewed and given to patient.  Pt concerned about being able to afford prenatal vitamin- given script as she stated medicaid has paid for prenatals with RX and suggested patient ask OB for samples at her appointment this week.  Paperwork, AVS, SRA, and transition record handed to patient.   Escorted off of unit at 1610. Belongings returned per belongings form.  Discharged to lobby where patient was met by mother.    Response Verbalized understanding of discharge teaching. Agrees to contact someone or 911 with thoughts/intent to harm self or others.  PRN's effective.   To follow up per AVS.

## 2017-01-10 NOTE — Progress Notes (Signed)
  [facility] Discharge Plan :  Will you be returning to the same living situation after discharge:  Yes,  home At discharge, do you have transportation home?: Yes,  family Do you have the ability to pay for your medications: Yes,  MCD  Release of information consent forms completed and in the chart;  Patient's signature needed at discharge.  Patient to Follow up at: Follow-up Information    [facility] Follow up on 01/13/2017.   Why:  Thuirsday at 9:00 for your hospital follow up appointment.  Make sure to call [facility] when you get home to get on their schedule.  Also make sure to go to the NA meeting at the church tonight Contact information: [address] [place] [address] [phone]           Next level of care provider has access to [facility] Link:no  Safety Planning and Suicide Prevention discussed: Yes,  yes     Has patient been referred to the Quitline?: Patient refused referral  Patient has been referred for addiction treatment: Yes  Ida RogueRodney B Breeann Reposa 01/10/2017, 11:17 AM

## 2017-01-20 ENCOUNTER — Ambulatory Visit: Payer: Medicaid Other | Admitting: *Deleted

## 2017-01-20 ENCOUNTER — Encounter: Payer: Medicaid Other | Admitting: Women's Health

## 2017-03-08 ENCOUNTER — Ambulatory Visit: Payer: Medicaid Other | Admitting: *Deleted

## 2017-03-08 ENCOUNTER — Ambulatory Visit (INDEPENDENT_AMBULATORY_CARE_PROVIDER_SITE_OTHER): Payer: Medicaid Other | Admitting: Advanced Practice Midwife

## 2017-03-08 ENCOUNTER — Encounter: Payer: Self-pay | Admitting: Advanced Practice Midwife

## 2017-03-08 VITALS — BP 88/50 | HR 74 | Wt 152.0 lb

## 2017-03-08 DIAGNOSIS — Z1389 Encounter for screening for other disorder: Secondary | ICD-10-CM

## 2017-03-08 DIAGNOSIS — O99322 Drug use complicating pregnancy, second trimester: Secondary | ICD-10-CM

## 2017-03-08 DIAGNOSIS — Z363 Encounter for antenatal screening for malformations: Secondary | ICD-10-CM | POA: Diagnosis not present

## 2017-03-08 DIAGNOSIS — Z3A22 22 weeks gestation of pregnancy: Secondary | ICD-10-CM

## 2017-03-08 DIAGNOSIS — F1920 Other psychoactive substance dependence, uncomplicated: Secondary | ICD-10-CM

## 2017-03-08 DIAGNOSIS — F1490 Cocaine use, unspecified, uncomplicated: Secondary | ICD-10-CM | POA: Diagnosis not present

## 2017-03-08 DIAGNOSIS — Z331 Pregnant state, incidental: Secondary | ICD-10-CM | POA: Diagnosis not present

## 2017-03-08 DIAGNOSIS — Z3482 Encounter for supervision of other normal pregnancy, second trimester: Secondary | ICD-10-CM

## 2017-03-08 DIAGNOSIS — Z3490 Encounter for supervision of normal pregnancy, unspecified, unspecified trimester: Secondary | ICD-10-CM | POA: Insufficient documentation

## 2017-03-08 DIAGNOSIS — O0932 Supervision of pregnancy with insufficient antenatal care, second trimester: Secondary | ICD-10-CM | POA: Diagnosis not present

## 2017-03-08 LAB — POCT URINALYSIS DIPSTICK
Blood, UA: NEGATIVE
Glucose, UA: NEGATIVE
Ketones, UA: NEGATIVE
Leukocytes, UA: NEGATIVE
Nitrite, UA: NEGATIVE
Protein, UA: NEGATIVE

## 2017-03-08 MED ORDER — PNV PRENATAL PLUS MULTIVITAMIN 27-1 MG PO TABS: 1.0000 | ORAL_TABLET | Freq: Every day | ORAL | 11 refills | Status: DC

## 2017-03-08 MED ORDER — METRONIDAZOLE 0.75 % VA GEL: 1.0000 | Freq: Every day | VAGINAL | 0 refills | Status: DC

## 2017-03-08 NOTE — Patient Instructions (Signed)

## 2017-03-08 NOTE — Progress Notes (Signed)
Subjective:    Susan [name] is a [id number] 6744w0d being seen today for her first obstetrical visit.  Her obstetrical history is significant for polysubstance abuse.  Pregnancy history fully reviewed. She doesn't have custody of any of her children.  Admits to cocaine a week ago.and "a few times a week ETOH"  Dnagers of cocaine in pregnancy, including fetal death/abruption/PTL discussed along w/risk for FAS.  Has  "[place] support [place]"  [place] several times a week, 4-5 hours a day, offering support and helping to navigate therapy appts.  Only on zyprexa, informed could go back on prozac if it was helpful   Patient reports vaginal discharge with odor .  Vitals:   03/08/17 1352  BP: (!) 88/50  Pulse: 74  Weight: 152 lb (68.9 kg)    HISTORY: OB History  Gravida Para Term Preterm AB Living  9 4 4   4 4   SAB TAB Ectopic Multiple Live Births  1 2 1  0 4    # Outcome Date GA Lbr Len/2nd Weight Sex Delivery Anes PTL Lv  9 Current           8 Term 08/03/15 192w2d 07:45 / 00:11 5 lb 8.4 oz (2.505 kg) M Vag-Spont None N LIV     Birth Comments: wnl and c/w GA  7 SAB 2016          6 Term 11/07/05 2350w0d  7 lb (3.175 kg) F Vag-Spont None N LIV  5 Term 12/18/04 823w0d  7 lb 8 oz (3.402 kg) F Vag-Spont EPI N LIV  4 Term 01/22/99 2450w0d  7 lb 10 oz (3.459 kg) M Vag-Spont EPI N LIV  3 TAB           2 TAB           1 Ectopic        N      Past Medical History:  Diagnosis Date  . Alcohol abuse   . Asthma   . BV (bacterial vaginosis) 03/06/2013  . Chronic dental pain   . Chronic pelvic pain in female   . Depression   . Ectopic pregnancy   . Panic attacks   . Polysubstance abuse    cocaine, opiates, marijuana  . Trichomonas vaginitis    Past Surgical History:  Procedure Laterality Date  . ECTOPIC PREGNANCY SURGERY     Family History  Problem Relation Age of Onset  . COPD Mother   . Stroke Mother   . Heart disease Maternal Grandmother   . Hypertension Maternal  Grandmother   . Stroke Maternal Grandfather   . Asthma Sister   . Down syndrome Sister      Exam       Pelvic Exam:    Perineum: Normal Perineum   Vulva: normal   Vagina:  normal mucosa, white discharge with aminie odor, wet prep + clue, no palpable nodules   Uterus Normal, Gravid, FH: 22     Cervix: normal   Adnexa: Not palpable   Urinary:  urethral meatus normal    System:     Skin: normal coloration and turgor, no rashes    Neurologic: oriented, normal, normal mood   Extremities: normal strength, tone, and muscle mass   HEENT PERRLA   Mouth/Teeth mucous membranes moist, poor dentition   Neck supple and no masses   Cardiovascular: regular rate and rhythm   Respiratory:  appears well, vitals normal, no respiratory distress, acyanotic   Abdomen:  soft, non-tender;  FHR: 150          Assessment:    Pregnancy: [id number] Patient Active Problem List   Diagnosis Date Noted  . Late prenatal care in second trimester 03/08/2017  . Supervision of normal pregnancy 03/08/2017  . MDD (major depressive disorder), single episode, severe with psychotic features (HCC) 01/05/2017  . Alcohol use disorder, severe, dependence (HCC) 01/05/2017  . Cocaine use disorder, moderate, dependence (HCC) 01/05/2017  . Cannabis use disorder, moderate, dependence (HCC) 01/05/2017  . Heavy smoker 02/03/2016  . Cocaine use 02/03/2016  . Chronic, continuous use of opioids 03/24/2015  . Depression with anxiety 03/24/2015  . Poor dentition 03/24/2015  . Sprain of ankle, unspecified site 05/28/2009        Plan:     Initial labs drawn. Continue prenatal vitamins  Problem list reviewed and updated  Metrogel (ETOH warning, pt wants it anyway) Encouraged to stick with peer counselor/therapy  Reviewed recommended weight gain based on pre-gravid BMI  Encouraged well-balanced diet Genetic Screening discussed : .  Ultrasound discussed; fetal survey: requested.  Return for ASAP for anatomy scan and  4 weeks for LROB.  CRESENZO-DISHMAN,Terrick Allred 03/08/2017

## 2017-03-09 ENCOUNTER — Ambulatory Visit (INDEPENDENT_AMBULATORY_CARE_PROVIDER_SITE_OTHER): Payer: Medicaid Other

## 2017-03-09 DIAGNOSIS — Z3402 Encounter for supervision of normal first pregnancy, second trimester: Secondary | ICD-10-CM

## 2017-03-09 DIAGNOSIS — O0932 Supervision of pregnancy with insufficient antenatal care, second trimester: Secondary | ICD-10-CM

## 2017-03-09 DIAGNOSIS — Z363 Encounter for antenatal screening for malformations: Secondary | ICD-10-CM | POA: Diagnosis not present

## 2017-03-09 LAB — CBC
Hematocrit: 33.6 % — ABNORMAL LOW (ref 34.0–46.6)
Hemoglobin: 11.1 g/dL (ref 11.1–15.9)
MCH: 33.3 pg — ABNORMAL HIGH (ref 26.6–33.0)
MCHC: 33 g/dL (ref 31.5–35.7)
MCV: 101 fL — ABNORMAL HIGH (ref 79–97)
Platelets: 216 10*3/uL (ref 150–379)
RBC: 3.33 x10E6/uL — ABNORMAL LOW (ref 3.77–5.28)
RDW: 13.2 % (ref 12.3–15.4)
WBC: 13 10*3/uL — ABNORMAL HIGH (ref 3.4–10.8)

## 2017-03-09 LAB — URINALYSIS, ROUTINE W REFLEX MICROSCOPIC
Bilirubin, UA: NEGATIVE
Glucose, UA: NEGATIVE
Ketones, UA: NEGATIVE
Leukocytes, UA: NEGATIVE
Nitrite, UA: NEGATIVE
Protein, UA: NEGATIVE
RBC, UA: NEGATIVE
Specific Gravity, UA: 1.014 (ref 1.005–1.030)
Urobilinogen, Ur: 0.2 mg/dL (ref 0.2–1.0)
pH, UA: 6.5 (ref 5.0–7.5)

## 2017-03-09 LAB — ABO/RH: Rh Factor: POSITIVE

## 2017-03-09 LAB — HIV ANTIBODY (ROUTINE TESTING W REFLEX): HIV Screen 4th Generation wRfx: NONREACTIVE

## 2017-03-09 LAB — ANTIBODY SCREEN: Antibody Screen: NEGATIVE

## 2017-03-09 LAB — VARICELLA ZOSTER ANTIBODY, IGG: Varicella zoster IgG: 1120 index (ref >165)

## 2017-03-09 LAB — MED LIST OPTION NOT SELECTED

## 2017-03-09 LAB — RUBELLA SCREEN: Rubella Antibodies, IGG: >33 index (ref >0.99)

## 2017-03-09 LAB — RPR: RPR Ser Ql: NONREACTIVE

## 2017-03-09 LAB — HEPATITIS B SURFACE ANTIGEN: Hepatitis B Surface Ag: NEGATIVE

## 2017-03-09 NOTE — Progress Notes (Signed)
US 22+1 wks,breech,thin anterior placenta 1.6 cm gr 0,cx 3.4 cm,svp of fluid 5.6 cm,normal ovaries bilat,bilat pyelectasis RK 5.6 mm,LK 5 mm,fhr 136 bpm,EFW 445 g 30%, anatomy complete

## 2017-03-10 ENCOUNTER — Telehealth: Payer: Self-pay | Admitting: *Deleted

## 2017-03-10 ENCOUNTER — Encounter: Payer: Self-pay | Admitting: Women's Health

## 2017-03-10 NOTE — Telephone Encounter (Signed)
Called pharmacy who states patient picked up all medications so nothing needs to be rewrote.

## 2017-03-11 LAB — PMP SCREEN PROFILE (10S), URINE
Amphetamine Scrn, Ur: NEGATIVE ng/mL
BARBITURATE SCREEN URINE: NEGATIVE ng/mL
BENZODIAZEPINE SCREEN, URINE: NEGATIVE ng/mL
CANNABINOIDS UR QL SCN: NEGATIVE ng/mL
Cocaine (Metab) Scrn, Ur: POSITIVE ng/mL — AB
Creatinine(Crt), U: 46.7 mg/dL (ref 20.0–300.0)
Methadone Screen, Urine: NEGATIVE ng/mL
OXYCODONE+OXYMORPHONE UR QL SCN: NEGATIVE ng/mL
Opiate Scrn, Ur: NEGATIVE ng/mL
Ph of Urine: 6.3 (ref 4.5–8.9)
Phencyclidine Qn, Ur: NEGATIVE ng/mL
Propoxyphene Scrn, Ur: NEGATIVE ng/mL

## 2017-03-11 LAB — GC/CHLAMYDIA PROBE AMP
Chlamydia trachomatis, NAA: NEGATIVE
Neisseria gonorrhoeae by PCR: NEGATIVE

## 2017-03-12 LAB — URINE CULTURE

## 2017-03-14 ENCOUNTER — Telehealth: Payer: Self-pay | Admitting: *Deleted

## 2017-03-14 ENCOUNTER — Other Ambulatory Visit: Payer: Self-pay | Admitting: Women's Health

## 2017-03-14 ENCOUNTER — Encounter: Payer: Self-pay | Admitting: Women's Health

## 2017-03-14 DIAGNOSIS — O9989 Other specified diseases and conditions complicating pregnancy, childbirth and the puerperium: Secondary | ICD-10-CM

## 2017-03-14 DIAGNOSIS — O99891 Other specified diseases and conditions complicating pregnancy: Secondary | ICD-10-CM | POA: Insufficient documentation

## 2017-03-14 DIAGNOSIS — R8271 Bacteriuria: Secondary | ICD-10-CM | POA: Insufficient documentation

## 2017-03-14 MED ORDER — AMOXICILLIN 500 MG PO CAPS: 500.0000 mg | ORAL_CAPSULE | Freq: Two times a day (BID) | ORAL | 0 refills | Status: DC

## 2017-03-14 NOTE — Telephone Encounter (Signed)
Neither phone number working

## 2017-03-15 ENCOUNTER — Telehealth: Payer: Self-pay | Admitting: *Deleted

## 2017-03-15 NOTE — Telephone Encounter (Signed)
LMOVM that culture showed UTI and prescription was sent to pharmacy. Advised to take all medication as prescribed.

## 2017-04-05 ENCOUNTER — Encounter: Payer: Medicaid Other | Admitting: Obstetrics & Gynecology

## 2017-04-05 ENCOUNTER — Encounter: Payer: Self-pay | Admitting: *Deleted

## 2017-04-09 ENCOUNTER — Inpatient Hospital Stay (HOSPITAL_COMMUNITY)
Admission: EM | Admit: 2017-04-09 | Discharge: 2017-04-12 | DRG: 775 | Disposition: A | Discharge status: Home / Self Care | Payer: Medicaid Other | Attending: Obstetrics & Gynecology | Admitting: Obstetrics & Gynecology

## 2017-04-09 ENCOUNTER — Encounter (HOSPITAL_COMMUNITY): Payer: Self-pay | Admitting: *Deleted

## 2017-04-09 DIAGNOSIS — O99324 Drug use complicating childbirth: Secondary | ICD-10-CM | POA: Diagnosis present

## 2017-04-09 DIAGNOSIS — O0932 Supervision of pregnancy with insufficient antenatal care, second trimester: Secondary | ICD-10-CM

## 2017-04-09 DIAGNOSIS — F17210 Nicotine dependence, cigarettes, uncomplicated: Secondary | ICD-10-CM | POA: Diagnosis present

## 2017-04-09 DIAGNOSIS — F1410 Cocaine abuse, uncomplicated: Secondary | ICD-10-CM | POA: Diagnosis present

## 2017-04-09 DIAGNOSIS — O4690 Antepartum hemorrhage, unspecified, unspecified trimester: Secondary | ICD-10-CM

## 2017-04-09 DIAGNOSIS — O328XX0 Maternal care for other malpresentation of fetus, not applicable or unspecified: Principal | ICD-10-CM | POA: Diagnosis present

## 2017-04-09 DIAGNOSIS — Z79899 Other long term (current) drug therapy: Secondary | ICD-10-CM

## 2017-04-09 DIAGNOSIS — O99314 Alcohol use complicating childbirth: Secondary | ICD-10-CM | POA: Diagnosis present

## 2017-04-09 DIAGNOSIS — O99320 Drug use complicating pregnancy, unspecified trimester: Secondary | ICD-10-CM

## 2017-04-09 DIAGNOSIS — O99334 Smoking (tobacco) complicating childbirth: Secondary | ICD-10-CM | POA: Diagnosis present

## 2017-04-09 DIAGNOSIS — Z3402 Encounter for supervision of normal first pregnancy, second trimester: Secondary | ICD-10-CM

## 2017-04-09 DIAGNOSIS — Z3A26 26 weeks gestation of pregnancy: Secondary | ICD-10-CM

## 2017-04-09 NOTE — ED Triage Notes (Signed)
Pt c/o lower abd pain described as contractions with vaginal bleeding that started this am, admits to last use of etoh and cocaine was 3 days ago,

## 2017-04-10 ENCOUNTER — Encounter (HOSPITAL_COMMUNITY): Payer: Self-pay | Admitting: *Deleted

## 2017-04-10 DIAGNOSIS — O99314 Alcohol use complicating childbirth: Secondary | ICD-10-CM | POA: Diagnosis present

## 2017-04-10 DIAGNOSIS — F1410 Cocaine abuse, uncomplicated: Secondary | ICD-10-CM | POA: Diagnosis present

## 2017-04-10 DIAGNOSIS — F17210 Nicotine dependence, cigarettes, uncomplicated: Secondary | ICD-10-CM | POA: Diagnosis present

## 2017-04-10 DIAGNOSIS — O4692 Antepartum hemorrhage, unspecified, second trimester: Secondary | ICD-10-CM | POA: Diagnosis present

## 2017-04-10 DIAGNOSIS — O99324 Drug use complicating childbirth: Secondary | ICD-10-CM | POA: Diagnosis present

## 2017-04-10 DIAGNOSIS — O328XX0 Maternal care for other malpresentation of fetus, not applicable or unspecified: Secondary | ICD-10-CM | POA: Diagnosis present

## 2017-04-10 DIAGNOSIS — Z79899 Other long term (current) drug therapy: Secondary | ICD-10-CM | POA: Diagnosis not present

## 2017-04-10 DIAGNOSIS — Z3A26 26 weeks gestation of pregnancy: Secondary | ICD-10-CM | POA: Diagnosis not present

## 2017-04-10 DIAGNOSIS — O99334 Smoking (tobacco) complicating childbirth: Secondary | ICD-10-CM | POA: Diagnosis present

## 2017-04-10 LAB — CBC
HCT: 27.7 % — ABNORMAL LOW (ref 36.0–46.0)
Hemoglobin: 9.6 g/dL — ABNORMAL LOW (ref 12.0–15.0)
MCH: 33.8 pg (ref 26.0–34.0)
MCHC: 34.7 g/dL (ref 30.0–36.0)
MCV: 97.5 fL (ref 78.0–100.0)
Platelets: 138 10*3/uL — ABNORMAL LOW (ref 150–400)
RBC: 2.84 MIL/uL — ABNORMAL LOW (ref 3.87–5.11)
RDW: 13.2 % (ref 11.5–15.5)
WBC: 20.4 10*3/uL — ABNORMAL HIGH (ref 4.0–10.5)

## 2017-04-10 LAB — URINALYSIS, ROUTINE W REFLEX MICROSCOPIC
Bacteria, UA: NONE SEEN
Bilirubin Urine: NEGATIVE
Glucose, UA: NEGATIVE mg/dL
Ketones, ur: 5 mg/dL — AB
Nitrite: NEGATIVE
Protein, ur: NEGATIVE mg/dL
Specific Gravity, Urine: 1.004 — ABNORMAL LOW (ref 1.005–1.030)
pH: 6 (ref 5.0–8.0)

## 2017-04-10 LAB — BASIC METABOLIC PANEL
Anion gap: 9 (ref 5–15)
BUN: 7 mg/dL (ref 6–20)
CO2: 21 mmol/L — ABNORMAL LOW (ref 22–32)
Calcium: 8.8 mg/dL — ABNORMAL LOW (ref 8.9–10.3)
Chloride: 106 mmol/L (ref 101–111)
Creatinine, Ser: 0.4 mg/dL — ABNORMAL LOW (ref 0.44–1.00)
GFR calc Af Amer: >60 mL/min (ref >60)
GFR calc non Af Amer: >60 mL/min (ref >60)
Glucose, Bld: 106 mg/dL — ABNORMAL HIGH (ref 65–99)
Potassium: 3.3 mmol/L — ABNORMAL LOW (ref 3.5–5.1)
Sodium: 136 mmol/L (ref 135–145)

## 2017-04-10 LAB — RAPID URINE DRUG SCREEN, HOSP PERFORMED
Amphetamines: NOT DETECTED
Barbiturates: NOT DETECTED
Benzodiazepines: NOT DETECTED
Cocaine: POSITIVE — AB
Opiates: NOT DETECTED
Tetrahydrocannabinol: NOT DETECTED

## 2017-04-10 LAB — CBC WITH DIFFERENTIAL/PLATELET
Basophils Absolute: 0 10*3/uL (ref 0.0–0.1)
Basophils Relative: 0 %
Eosinophils Absolute: 0.1 10*3/uL (ref 0.0–0.7)
Eosinophils Relative: 1 %
HCT: 29.1 % — ABNORMAL LOW (ref 36.0–46.0)
Hemoglobin: 10.2 g/dL — ABNORMAL LOW (ref 12.0–15.0)
Lymphocytes Relative: 13 %
Lymphs Abs: 2.4 10*3/uL (ref 0.7–4.0)
MCH: 34.3 pg — ABNORMAL HIGH (ref 26.0–34.0)
MCHC: 35.1 g/dL (ref 30.0–36.0)
MCV: 98 fL (ref 78.0–100.0)
Monocytes Absolute: 0.7 10*3/uL (ref 0.1–1.0)
Monocytes Relative: 4 %
Neutro Abs: 14.9 10*3/uL — ABNORMAL HIGH (ref 1.7–7.7)
Neutrophils Relative %: 82 %
Platelets: 166 10*3/uL (ref 150–400)
RBC: 2.97 MIL/uL — ABNORMAL LOW (ref 3.87–5.11)
RDW: 12.9 % (ref 11.5–15.5)
WBC: 18.2 10*3/uL — ABNORMAL HIGH (ref 4.0–10.5)

## 2017-04-10 LAB — TYPE AND SCREEN
ABO/RH(D): O POS
Antibody Screen: NEGATIVE

## 2017-04-10 LAB — ETHANOL: Alcohol, Ethyl (B): <5 mg/dL (ref <5)

## 2017-04-10 LAB — RPR: RPR Ser Ql: NONREACTIVE

## 2017-04-10 MED ORDER — ONDANSETRON HCL 4 MG/2ML IJ SOLN: 4.0000 mg | Freq: Four times a day (QID) | INTRAMUSCULAR | Status: DC | PRN

## 2017-04-10 MED ORDER — LACTATED RINGERS IV SOLN: 500.0000 mL | INTRAVENOUS | Status: DC | PRN

## 2017-04-10 MED ORDER — OXYTOCIN 40 UNITS IN LACTATED RINGERS INFUSION - SIMPLE MED
INTRAVENOUS | Status: AC
  Filled 2017-04-10: qty 1000

## 2017-04-10 MED ORDER — OXYCODONE-ACETAMINOPHEN 5-325 MG PO TABS: 1.0000 | ORAL_TABLET | ORAL | Status: DC | PRN

## 2017-04-10 MED ORDER — MAGNESIUM SULFATE BOLUS VIA INFUSION
4.0000 g | Freq: Once | INTRAVENOUS | Status: DC
  Filled 2017-04-10: qty 500

## 2017-04-10 MED ORDER — SODIUM CHLORIDE 0.9 % IV SOLN
2.0000 g | Freq: Once | INTRAVENOUS | Status: DC
  Filled 2017-04-10: qty 2000

## 2017-04-10 MED ORDER — NALBUPHINE HCL 10 MG/ML IJ SOLN: 5.0000 mg | INTRAMUSCULAR | Status: DC | PRN

## 2017-04-10 MED ORDER — MAGNESIUM SULFATE 40 G IN LACTATED RINGERS - SIMPLE
2.0000 g/h | INTRAVENOUS | Status: DC
  Filled 2017-04-10: qty 500

## 2017-04-10 MED ORDER — SOD CITRATE-CITRIC ACID 500-334 MG/5ML PO SOLN: 30.0000 mL | ORAL | Status: DC | PRN

## 2017-04-10 MED ORDER — ACETAMINOPHEN 325 MG PO TABS: 650.0000 mg | ORAL_TABLET | ORAL | Status: DC | PRN

## 2017-04-10 MED ORDER — LACTATED RINGERS IV SOLN: INTRAVENOUS | Status: DC

## 2017-04-10 MED ORDER — TETANUS-DIPHTH-ACELL PERTUSSIS 5-2.5-18.5 LF-MCG/0.5 IM SUSP: 0.5000 mL | Freq: Once | INTRAMUSCULAR | Status: DC

## 2017-04-10 MED ORDER — LIDOCAINE HCL (PF) 1 % IJ SOLN
INTRAMUSCULAR | Status: AC
  Filled 2017-04-10: qty 30

## 2017-04-10 MED ORDER — SIMETHICONE 80 MG PO CHEW: 80.0000 mg | CHEWABLE_TABLET | ORAL | Status: DC | PRN

## 2017-04-10 MED ORDER — DIPHENHYDRAMINE HCL 25 MG PO CAPS: 25.0000 mg | ORAL_CAPSULE | Freq: Four times a day (QID) | ORAL | Status: DC | PRN

## 2017-04-10 MED ORDER — WITCH HAZEL-GLYCERIN EX PADS: 1.0000 "application " | MEDICATED_PAD | CUTANEOUS | Status: DC | PRN

## 2017-04-10 MED ORDER — ZOLPIDEM TARTRATE 5 MG PO TABS: 5.0000 mg | ORAL_TABLET | Freq: Every evening | ORAL | Status: DC | PRN

## 2017-04-10 MED ORDER — PRENATAL MULTIVITAMIN CH
1.0000 | ORAL_TABLET | Freq: Every day | ORAL | Status: DC
  Administered 2017-04-10 – 2017-04-12 (×3): 1 via ORAL
  Filled 2017-04-10 (×3): qty 1

## 2017-04-10 MED ORDER — LIDOCAINE HCL (PF) 1 % IJ SOLN
30.0000 mL | INTRAMUSCULAR | Status: DC | PRN
  Filled 2017-04-10: qty 30

## 2017-04-10 MED ORDER — SODIUM CHLORIDE 0.9 % IV SOLN: INTRAVENOUS | Status: DC

## 2017-04-10 MED ORDER — OXYTOCIN 40 UNITS IN LACTATED RINGERS INFUSION - SIMPLE MED: 2.5000 [IU]/h | INTRAVENOUS | Status: DC

## 2017-04-10 MED ORDER — ONDANSETRON HCL 4 MG PO TABS: 4.0000 mg | ORAL_TABLET | ORAL | Status: DC | PRN

## 2017-04-10 MED ORDER — OXYCODONE-ACETAMINOPHEN 5-325 MG PO TABS: 2.0000 | ORAL_TABLET | ORAL | Status: DC | PRN

## 2017-04-10 MED ORDER — OXYTOCIN BOLUS FROM INFUSION
500.0000 mL | Freq: Once | INTRAVENOUS | Status: AC
  Administered 2017-04-10: 500 mL via INTRAVENOUS

## 2017-04-10 MED ORDER — ACETAMINOPHEN 325 MG PO TABS
650.0000 mg | ORAL_TABLET | ORAL | Status: DC | PRN
  Administered 2017-04-11: 650 mg via ORAL
  Filled 2017-04-10: qty 2

## 2017-04-10 MED ORDER — DIBUCAINE 1 % RE OINT: 1.0000 "application " | TOPICAL_OINTMENT | RECTAL | Status: DC | PRN

## 2017-04-10 MED ORDER — BENZOCAINE-MENTHOL 20-0.5 % EX AERO: 1.0000 "application " | INHALATION_SPRAY | CUTANEOUS | Status: DC | PRN

## 2017-04-10 MED ORDER — IBUPROFEN 600 MG PO TABS
600.0000 mg | ORAL_TABLET | Freq: Four times a day (QID) | ORAL | Status: DC
  Administered 2017-04-10 – 2017-04-12 (×10): 600 mg via ORAL
  Filled 2017-04-10 (×10): qty 1

## 2017-04-10 MED ORDER — ONDANSETRON HCL 4 MG/2ML IJ SOLN: 4.0000 mg | INTRAMUSCULAR | Status: DC | PRN

## 2017-04-10 MED ORDER — BETAMETHASONE SOD PHOS & ACET 6 (3-3) MG/ML IJ SUSP
12.0000 mg | Freq: Once | INTRAMUSCULAR | Status: AC
  Administered 2017-04-10: 12 mg via INTRAMUSCULAR
  Filled 2017-04-10 (×2): qty 2

## 2017-04-10 MED ORDER — COCONUT OIL OIL: 1.0000 "application " | TOPICAL_OIL | Status: DC | PRN

## 2017-04-10 MED ORDER — SENNOSIDES-DOCUSATE SODIUM 8.6-50 MG PO TABS
2.0000 | ORAL_TABLET | ORAL | Status: DC
  Administered 2017-04-10 – 2017-04-11 (×2): 2 via ORAL
  Filled 2017-04-10 (×2): qty 2

## 2017-04-10 NOTE — ED Provider Notes (Signed)
[facility]-EMERGENCY DEPT Provider Note   CSN: [id number] Arrival date & time: 04/09/17  2333  Time seen 23:50 PM    History   Chief Complaint Chief Complaint  Patient presents with  . Vaginal Bleeding    HPI Susan [name] is a 41 y.o. female.  HPI  Patient is O+,G9 P4 AB 4, 26 weeks. She had her first prenatal appointment at [facility] on July 31.She had an ultrasound done on August 1 which showed her to be 22 weeks 1 day gestation with a breech presentation and a thin anterior placenta 1.6 cm grade 0. Patient states she has been taking her prenatal vitamins. She states she had a follow-up appointment this past week however she didn't make the appointment because "I had to go get food". Her pregnancy has been complicated by ongoing alcohol and cocaine abuse. She states she started spotting this morning, she started getting lower abdominal pain at lunchtime. She states at first it was constant and now comes and goes "like a contraction" and lasts about 1 minute. She states currently she's been bleeding "like a normal period" for at least 5 hours. She denies any nausea or vomiting. She states this is the first complication she has had this pregnancy. She states "I want this baby". Patient states she last drank and did cocaine about 3 days ago. She states she rarely does cocaine which is 1-2 times a week.  [facility]  Past Medical History:  Diagnosis Date  . Alcohol abuse   . Asthma   . BV (bacterial vaginosis) 03/06/2013  . Chronic dental pain   . Chronic pelvic pain in female   . Depression   . Ectopic pregnancy   . Panic attacks   . Polysubstance abuse    cocaine, opiates, marijuana  . Trichomonas vaginitis     Patient Active Problem List   Diagnosis Date Noted  . Preterm labor 04/10/2017  . Asymptomatic bacteriuria during pregnancy in second trimester 03/14/2017  . Late prenatal care in second trimester 03/08/2017  . Supervision of normal pregnancy 03/08/2017  . MDD (major  depressive disorder), single episode, severe with psychotic features (HCC) 01/05/2017  . Alcohol use disorder, severe, dependence (HCC) 01/05/2017  . Cocaine use disorder, moderate, dependence (HCC) 01/05/2017  . Cannabis use disorder, moderate, dependence (HCC) 01/05/2017  . Heavy smoker 02/03/2016  . Cocaine abuse affecting pregnancy, antepartum 02/03/2016  . Chronic, continuous use of opioids 03/24/2015  . Depression with anxiety 03/24/2015  . Poor dentition 03/24/2015  . Sprain of ankle, unspecified site 05/28/2009    Past Surgical History:  Procedure Laterality Date  . ECTOPIC PREGNANCY SURGERY      OB History    Gravida Para Term Preterm AB Living   9 4 4   4 4    SAB TAB Ectopic Multiple Live Births   1 2 1  0 4       Home Medications    States she is only taking PNV  Prior to Admission medications   Medication Sig Start Date End Date Taking? Authorizing Provider  Prenatal Vit-Fe Fumarate-FA (PRENATAL MULTIVITAMIN) TABS tablet Take 1 tablet by mouth daily at 12 noon. Prenatal vitamin supplement 01/10/17  Yes Nwoko, Nicole Kindred I, NP  amoxicillin (AMOXIL) 500 MG capsule Take 1 capsule (500 mg total) by mouth 2 (two) times daily. X 7 days 03/14/17   Cheral Marker, CNM  diphenhydrAMINE (BENADRYL) 25 mg capsule Take 1 capsule (25 mg three times daily & 2 capsules (50 mg)  at bed time as needed: For anxiety, tremors & sleep. Patient not taking: Reported on 03/08/2017 01/10/17   Armandina Stammer I, NP  metroNIDAZOLE (METROGEL VAGINAL) 0.75 % vaginal gel Place 1 Applicatorful vaginally at bedtime. For 5 nights 03/08/17   Cresenzo-Dishmon, Scarlette Calico, CNM  OLANZapine (ZYPREXA) 7.5 MG tablet Take 1 tablet (7.5 mg total) by mouth at bedtime. For mood control Patient taking differently: Take 5 mg by mouth at bedtime. For mood control 01/10/17   Armandina Stammer I, NP  Prenatal Vit-Fe Fumarate-FA (PNV PRENATAL PLUS MULTIVITAMIN) 27-1 MG TABS Take 1 tablet by mouth daily. 03/08/17   Jacklyn Shell,  CNM    Family History Family History  Problem Relation Age of Onset  . COPD Mother   . Stroke Mother   . Heart disease Maternal Grandmother   . Hypertension Maternal Grandmother   . Stroke Maternal Grandfather   . Asthma Sister   . Down syndrome Sister     Social History Social History  Substance Use Topics  . Smoking status: Current Every Day Smoker    Packs/day: 1.00    Types: Cigarettes  . Smokeless tobacco: Never Used  . Alcohol use Yes     Comment: last use 3 days ago,   smokes 1 1/2 ppd Does cocaine rarely 1-2 times a week Drinks alcohol qod about 40 ounces unemployed   Allergies   Patient has no known allergies.   Review of Systems Review of Systems  All other systems reviewed and are negative.    Physical Exam Updated Vital Signs BP 117/66   Pulse 81   Temp 98.2 F (36.8 C) (Oral)   Resp 20   Ht 5\' 1"  (1.549 m)   Wt 63.5 kg (140 lb)   LMP  (LMP Unknown) Comment: Pt reports that she is pregnant.  She does not give me LMP or any additional information  SpO2 99%   BMI 26.45 kg/m   Vital signs normal    Physical Exam  Constitutional: She is oriented to person, place, and time. She appears well-developed and well-nourished.  Non-toxic appearance. She does not appear ill. No distress.  HENT:  Head: Normocephalic and atraumatic.  Right Ear: External ear normal.  Left Ear: External ear normal.  Nose: Nose normal. No mucosal edema or rhinorrhea.  Mouth/Throat: Oropharynx is clear and moist and mucous membranes are normal. No dental abscesses or uvula swelling.  Eyes: Pupils are equal, round, and reactive to light. Conjunctivae and EOM are normal.  Neck: Normal range of motion and full passive range of motion without pain. Neck supple.  Cardiovascular: Normal rate, regular rhythm and normal heart sounds.  Exam reveals no gallop and no friction rub.   No murmur heard. Pulmonary/Chest: Effort normal and breath sounds normal. No respiratory distress.  She has no wheezes. She has no rhonchi. She has no rales. She exhibits no tenderness and no crepitus.  Abdominal: Soft. Normal appearance and bowel sounds are normal. She exhibits no distension. There is no tenderness. There is no rebound and no guarding.  C/W dates, uterus does not appear firm or hard. FHR 133. She appears nontender to palpation   Musculoskeletal: Normal range of motion. She exhibits no edema or tenderness.  Moves all extremities well.   Neurological: She is alert and oriented to person, place, and time. She has normal strength. No cranial nerve deficit.  Skin: Skin is warm, dry and intact. No rash noted. No erythema. No pallor.  Psychiatric: She has a normal mood and  affect. Her speech is normal and behavior is normal. Her mood appears not anxious.  Nursing note and vitals reviewed.    ED Treatments / Results  Labs (all labs ordered are listed, but only abnormal results are displayed)   Results for orders placed or performed during the hospital encounter of 04/09/17  Basic metabolic panel  Result Value Ref Range   Sodium 136 135 - 145 mmol/L   Potassium 3.3 (L) 3.5 - 5.1 mmol/L   Chloride 106 101 - 111 mmol/L   CO2 21 (L) 22 - 32 mmol/L   Glucose, Bld 106 (H) 65 - 99 mg/dL   BUN 7 6 - 20 mg/dL   Creatinine, Ser 1.61 (L) 0.44 - 1.00 mg/dL   Calcium 8.8 (L) 8.9 - 10.3 mg/dL   GFR calc non Af Amer >60 >60 mL/min   GFR calc Af Amer >60 >60 mL/min   Anion gap 9 5 - 15  Ethanol  Result Value Ref Range   Alcohol, Ethyl (B) <5 <5 mg/dL  CBC with Differential  Result Value Ref Range   WBC 18.2 (H) 4.0 - 10.5 K/uL   RBC 2.97 (L) 3.87 - 5.11 MIL/uL   Hemoglobin 10.2 (L) 12.0 - 15.0 g/dL   HCT 09.6 (L) 04.5 - 40.9 %   MCV 98.0 78.0 - 100.0 fL   MCH 34.3 (H) 26.0 - 34.0 pg   MCHC 35.1 30.0 - 36.0 g/dL   RDW 81.1 91.4 - 78.2 %   Platelets 166 150 - 400 K/uL   Neutrophils Relative % 82 %   Neutro Abs 14.9 (H) 1.7 - 7.7 K/uL   Lymphocytes Relative 13 %   Lymphs Abs  2.4 0.7 - 4.0 K/uL   Monocytes Relative 4 %   Monocytes Absolute 0.7 0.1 - 1.0 K/uL   Eosinophils Relative 1 %   Eosinophils Absolute 0.1 0.0 - 0.7 K/uL   Basophils Relative 0 %   Basophils Absolute 0.0 0.0 - 0.1 K/uL  Urine rapid drug screen (hosp performed)  Result Value Ref Range   Opiates NONE DETECTED NONE DETECTED   Cocaine POSITIVE (A) NONE DETECTED   Benzodiazepines NONE DETECTED NONE DETECTED   Amphetamines NONE DETECTED NONE DETECTED   Tetrahydrocannabinol NONE DETECTED NONE DETECTED   Barbiturates NONE DETECTED NONE DETECTED  Urinalysis, Routine w reflex microscopic  Result Value Ref Range   Color, Urine YELLOW YELLOW   APPearance CLEAR CLEAR   Specific Gravity, Urine 1.004 (L) 1.005 - 1.030   pH 6.0 5.0 - 8.0   Glucose, UA NEGATIVE NEGATIVE mg/dL   Hgb urine dipstick LARGE (A) NEGATIVE   Bilirubin Urine NEGATIVE NEGATIVE   Ketones, ur 5 (A) NEGATIVE mg/dL   Protein, ur NEGATIVE NEGATIVE mg/dL   Nitrite NEGATIVE NEGATIVE   Leukocytes, UA MODERATE (A) NEGATIVE   RBC / HPF 0-5 0 - 5 RBC/hpf   WBC, UA 6-30 0 - 5 WBC/hpf   Bacteria, UA NONE SEEN NONE SEEN   Squamous Epithelial / LPF 0-5 (A) NONE SEEN   Laboratory interpretation all normal except leukocytosis, + UDS, mild anemia, mild hypokalemia       Results for orders placed or performed in visit on 03/08/17  GC/Chlamydia Probe Amp  Result Value Ref Range   Chlamydia trachomatis, NAA Negative Negative   Neisseria gonorrhoeae by PCR Negative Negative  Urine Culture  Result Value Ref Range   Urine Culture, Routine Final report (A)    Organism ID, Bacteria Comment (A)   Pain Management  Screening Profile (10S)  Result Value Ref Range   Amphetamine Scrn, Ur Negative Cutoff=1000 ng/mL   BARBITURATE SCREEN URINE Negative Cutoff=200 ng/mL   BENZODIAZEPINE SCREEN, URINE Negative Cutoff=200 ng/mL   CANNABINOIDS UR QL SCN Negative Cutoff=20 ng/mL   Cocaine (Metab) Scrn, Ur Positive (A) Cutoff=300 ng/mL   Opiate  Scrn, Ur Negative Cutoff=300 ng/mL   OXYCODONE+OXYMORPHONE UR QL SCN Negative Cutoff=100 ng/mL   Phencyclidine Qn, Ur Negative Cutoff=25 ng/mL   Methadone Screen, Urine Negative Cutoff=300 ng/mL   Propoxyphene Scrn, Ur Negative Cutoff=300 ng/mL   Creatinine(Crt), U 46.7 20.0 - 300.0 mg/dL   Ph of Urine 6.3 4.5 - 8.9   PLEASE NOTE: Comment   CBC  Result Value Ref Range   WBC 13.0 (H) 3.4 - 10.8 x10E3/uL   RBC 3.33 (L) 3.77 - 5.28 x10E6/uL   Hemoglobin 11.1 11.1 - 15.9 g/dL   Hematocrit 16.1 (L) 09.6 - 46.6 %   MCV 101 (H) 79 - 97 fL   MCH 33.3 (H) 26.6 - 33.0 pg   MCHC 33.0 31.5 - 35.7 g/dL   RDW 04.5 40.9 - 81.1 %   Platelets 216 150 - 379 x10E3/uL  Hepatitis B surface antigen  Result Value Ref Range   Hepatitis B Surface Ag Negative Negative  HIV antibody  Result Value Ref Range   HIV Screen 4th Generation wRfx Non Reactive Non Reactive  Varicella zoster antibody, IgG  Result Value Ref Range   Varicella zoster IgG 1,120 Immune >165 index  Urinalysis, Routine w reflex microscopic  Result Value Ref Range   Specific Gravity, UA 1.014 1.005 - 1.030   pH, UA 6.5 5.0 - 7.5   Color, UA Yellow Yellow   Appearance Ur Clear Clear   Leukocytes, UA Negative Negative   Protein, UA Negative Negative/Trace   Glucose, UA Negative Negative   Ketones, UA Negative Negative   RBC, UA Negative Negative   Bilirubin, UA Negative Negative   Urobilinogen, Ur 0.2 0.2 - 1.0 mg/dL   Nitrite, UA Negative Negative   Microscopic Examination Comment   Rubella screen  Result Value Ref Range   Rubella Antibodies, IGG >33.00 Immune >0.99 index  RPR  Result Value Ref Range   RPR Ser Ql Non Reactive Non Reactive  Med List Option Not Selected  Result Value Ref Range   Med List Option Not Selected WILL FOLLOW   POCT urinalysis dipstick  Result Value Ref Range   Color, UA     Clarity, UA     Glucose, UA neg    Bilirubin, UA     Ketones, UA neg    Spec Grav, UA  1.010 - 1.025   Blood, UA neg     pH, UA  5.0 - 8.0   Protein, UA neg    Urobilinogen, UA  0.2 or 1.0 E.U./dL   Nitrite, UA neg    Leukocytes, UA Negative Negative  ABO/Rh  Result Value Ref Range   ABO Grouping O    Rh Factor Positive   Antibody screen  Result Value Ref Range   Antibody Screen Negative Negative      EKG  EKG Interpretation None       Radiology No results found.  Procedures Procedures (including critical care time)  Medications Ordered in ED Medications   0.9 NS at 100 cc/hr  Initial Impression / Assessment and Plan / ED Course  I have reviewed the triage vital signs and the nursing notes.  Pertinent labs & imaging results that  were available during my care of the patient were reviewed by me and considered in my medical decision making (see chart for details).     Patient had fetal monitoring done. Her fetal heart rate is in the 130s.  12:02 AM Dr Despina HiddenEure, [facility] patient in transfer to MAU.   Final Clinical Impressions(s) / ED Diagnoses   Final diagnoses:  Vaginal bleeding in pregnancy   [facility]  Devoria AlbeIva Rajean Desantiago, MD, Iline OvenFACEP     Gaetan Spieker, Jodelle GrossIva, MD 04/10/17 [phone]

## 2017-04-10 NOTE — Consult Note (Signed)
Neonatology Note:   Attendance at Delivery:    I was asked by Dr. Eure to attend this breech vaginal delivery following precipitous labor at 26 5/7 weeks. The mother is a G9P4A4 O pos, GBS unknown with onset of labor yesterday. She presented to MAU this morning, completely dilated and with a bulging bag. She had had only 1 prenatal care visit at 22 weeks and is a polydrug abuser, admitting to use of alcohol and cocaine 3 days ago. She has a history of depression and panic attacks. UDS positive for cocaine on admission. She got a dose of Betamethasone just before delivery; there was no time for antibiotics. ROM at delivery, fluid clear, mother afebrile. Infant delivered precipitously, double footling breech, and had some tone and a single weak cry. Cord milked X 4, then clamped. Infant was dried and placed into a portawarmer bag. Bulb suctioned. Infant was moving arms and appeared fairly pink, but HR was < 100 with ineffective respirations, so PPV was applied. Chest rise was not seen, so suctioned again and increased PIP. HR about 100, O2 saturations below expected parameters. I intubated infant atraumatically on first attempt at 3 minutes of life with a 2.5 mm ETT to a depth of 7 cm at the lips. Required PIP of 25 to get good air movement, an increase in O2 saturations and HR. Gave 2.3 ml Infasurf at 9 minutes of life, tolerated well. Infant very active and breathing spontaneously, able to wean FIO2 down to 21% by about 11-12 minutes. Ap 4/8/9. Infant was seen briefly by his mother, then was transported to the NICU for further care.   Dajanee Voorheis C. Tenna Lacko, MD 

## 2017-04-10 NOTE — H&P (Signed)
LABOR AND DELIVERY ADMISSION HISTORY AND PHYSICAL NOTE  Susan [name] is a 41 y.o. female [phone] with IUP at [redacted]w[redacted]d by 14-week U/S, and pregnancy complicated by cocaine abuse, alcohol use, late [facility], and h/o depression, presenting for PTL. She originally presented to [facility] ED tonight d/t cramping and vaginal bleeding. Transferred to [facility] and in the MAU, SVE found to be complete.  Prenatal History/Complications: - Cocaine use, alcohol use in pregnancy, h/o polysubstance abuse - H/o depression - Late PNC (one PNV at 22 weeks)  Past Medical History: Past Medical History:  Diagnosis Date  . Alcohol abuse   . Asthma   . BV (bacterial vaginosis) 03/06/2013  . Chronic dental pain   . Chronic pelvic pain in female   . Depression   . Ectopic pregnancy   . Panic attacks   . Polysubstance abuse    cocaine, opiates, marijuana  . Trichomonas vaginitis     Past Surgical History: Past Surgical History:  Procedure Laterality Date  . ECTOPIC PREGNANCY SURGERY      Obstetrical History: OB History    Gravida Para Term Preterm AB Living   9 5 4 1 4 5    SAB TAB Ectopic Multiple Live Births   1 2 1  0 5      Social History: Social History   Social History  . Marital status: Single    Spouse name: N/A  . Number of children: N/A  . Years of education: N/A   Social History Main Topics  . Smoking status: Current Every Day Smoker    Packs/day: 1.00    Types: Cigarettes  . Smokeless tobacco: Never Used  . Alcohol use Yes     Comment: last use 3 days ago,   . Drug use: Yes    Types: Marijuana, Cocaine     Comment: last use of cocaine was 3 days ago  . Sexual activity: Yes    Birth control/ protection: None   Other Topics Concern  . None   Social History Narrative  . None    Family History: Family History  Problem Relation Age of Onset  . COPD Mother   . Stroke Mother   . Heart disease Maternal Grandmother   . Hypertension Maternal Grandmother   . Stroke Maternal  Grandfather   . Asthma Sister   . Down syndrome Sister     Allergies: No Known Allergies  Prescriptions Prior to Admission  Medication Sig Dispense Refill Last Dose  . Prenatal Vit-Fe Fumarate-FA (PRENATAL MULTIVITAMIN) TABS tablet Take 1 tablet by mouth daily at 12 noon. Prenatal vitamin supplement 30 tablet 0 Not Taking  . amoxicillin (AMOXIL) 500 MG capsule Take 1 capsule (500 mg total) by mouth 2 (two) times daily. X 7 days 14 capsule 0   . diphenhydrAMINE (BENADRYL) 25 mg capsule Take 1 capsule (25 mg three times daily & 2 capsules (50 mg) at bed time as needed: For anxiety, tremors & sleep. (Patient not taking: Reported on 03/08/2017) 60 capsule 0 Not Taking  . metroNIDAZOLE (METROGEL VAGINAL) 0.75 % vaginal gel Place 1 Applicatorful vaginally at bedtime. For 5 nights 70 g 0   . OLANZapine (ZYPREXA) 7.5 MG tablet Take 1 tablet (7.5 mg total) by mouth at bedtime. For mood control (Patient taking differently: Take 5 mg by mouth at bedtime. For mood control) 30 tablet 0 Taking  . Prenatal Vit-Fe Fumarate-FA (PNV PRENATAL PLUS MULTIVITAMIN) 27-1 MG TABS Take 1 tablet by mouth daily. 30 tablet 11  Review of Systems   All systems reviewed and negative except as stated in HPI  Blood pressure (!) 119/55, pulse 69, temperature 98.6 F (37 C), temperature source Oral, resp. rate 18, height 5\' 1"  (1.549 m), weight 140 lb (63.5 kg), SpO2 99 %, unknown if currently breastfeeding. General appearance: alert and cooperative Lungs: normal WOB Heart: regular rate Abdomen: soft, non-tender Extremities: No calf swelling Presentation: breech Fetal monitoring: baseline rate 130, moderate variability, 10x10 ace, no devel SVE: Dilation: 10 Exam by:: HEATHER, CNM   Prenatal labs: ABO, Rh: O/Positive/-- (07/31 1445) Antibody: Negative (07/31 1445) Rubella: immune RPR: Non Reactive (07/31 1445)  HBsAg: Negative (07/31 1445)  HIV:   negative (03/08/17) GBS:   unknown 1 hr Glucola:  n/a Genetic screening:  n/a Anatomy US: slight pyelectasis less than 1 cm and with normal appearing cortical tissues, no evidence of hydroureter and normal bladder size and a female infant  Prenatal Transfer Tool  Maternal Diabetes: No Genetic Screening: not done Maternal Ultrasounds/Referrals: Normal Fetal Ultrasounds or other Referrals:  Other: as above Maternal Substance Abuse:  Yes:  Type: Cocaine, alcohol Significant Maternal Medications:  None Significant Maternal Lab Results: Lab values include: Other: UDS +cocaine  Results for orders placed or performed during the hospital encounter of 04/09/17 (from the past 24 hour(s))  Basic metabolic panel   Collection Time: 04/10/17 12:01 AM  Result Value Ref Range   Sodium 136 135 - 145 mmol/L   Potassium 3.3 (L) 3.5 - 5.1 mmol/L   Chloride 106 101 - 111 mmol/L   CO2 21 (L) 22 - 32 mmol/L   Glucose, Bld 106 (H) 65 - 99 mg/dL   BUN 7 6 - 20 mg/dL   Creatinine, Ser 2.95 (L) 0.44 - 1.00 mg/dL   Calcium 8.8 (L) 8.9 - 10.3 mg/dL   GFR calc non Af Amer >60 >60 mL/min   GFR calc Af Amer >60 >60 mL/min   Anion gap 9 5 - 15  Ethanol   Collection Time: 04/10/17 12:01 AM  Result Value Ref Range   Alcohol, Ethyl (B) <5 <5 mg/dL  CBC with Differential   Collection Time: 04/10/17 12:01 AM  Result Value Ref Range   WBC 18.2 (H) 4.0 - 10.5 K/uL   RBC 2.97 (L) 3.87 - 5.11 MIL/uL   Hemoglobin 10.2 (L) 12.0 - 15.0 g/dL   HCT 28.4 (L) 13.2 - 44.0 %   MCV 98.0 78.0 - 100.0 fL   MCH 34.3 (H) 26.0 - 34.0 pg   MCHC 35.1 30.0 - 36.0 g/dL   RDW 10.2 72.5 - 36.6 %   Platelets 166 150 - 400 K/uL   Neutrophils Relative % 82 %   Neutro Abs 14.9 (H) 1.7 - 7.7 K/uL   Lymphocytes Relative 13 %   Lymphs Abs 2.4 0.7 - 4.0 K/uL   Monocytes Relative 4 %   Monocytes Absolute 0.7 0.1 - 1.0 K/uL   Eosinophils Relative 1 %   Eosinophils Absolute 0.1 0.0 - 0.7 K/uL   Basophils Relative 0 %   Basophils Absolute 0.0 0.0 - 0.1 K/uL  Urine rapid drug screen (hosp  performed)   Collection Time: 04/10/17 12:19 AM  Result Value Ref Range   Opiates NONE DETECTED NONE DETECTED   Cocaine POSITIVE (A) NONE DETECTED   Benzodiazepines NONE DETECTED NONE DETECTED   Amphetamines NONE DETECTED NONE DETECTED   Tetrahydrocannabinol NONE DETECTED NONE DETECTED   Barbiturates NONE DETECTED NONE DETECTED  Urinalysis, Routine w reflex microscopic  Collection Time: 04/10/17  1:15 AM  Result Value Ref Range   Color, Urine YELLOW YELLOW   APPearance CLEAR CLEAR   Specific Gravity, Urine 1.004 (L) 1.005 - 1.030   pH 6.0 5.0 - 8.0   Glucose, UA NEGATIVE NEGATIVE mg/dL   Hgb urine dipstick LARGE (A) NEGATIVE   Bilirubin Urine NEGATIVE NEGATIVE   Ketones, ur 5 (A) NEGATIVE mg/dL   Protein, ur NEGATIVE NEGATIVE mg/dL   Nitrite NEGATIVE NEGATIVE   Leukocytes, UA MODERATE (A) NEGATIVE   RBC / HPF 0-5 0 - 5 RBC/hpf   WBC, UA 6-30 0 - 5 WBC/hpf   Bacteria, UA NONE SEEN NONE SEEN   Squamous Epithelial / LPF 0-5 (A) NONE SEEN     Assessment: Gwendolyn Filludrey W Paynter is a 41 y.o. [id number] at 8247w5d here for PTL.  #Labor: Expectant management, expect imminent deliver. BMZ given in MAU at 0153 #FWB: Cat I #ID:  GBS unknown  #MOC: wants BTL  Kandra NicolasJulie P Degele 04/10/2017

## 2017-04-10 NOTE — Lactation Note (Signed)
This note was copied from a baby's chart. Lactation Consultation Note  Patient Name: Boy Berniece Andreasudrey Husband ZOXWR'UToday's Date: 04/10/2017 Reason for consult: Initial assessment  With this mom of a NICU baby, 26 5/[redacted] weeks gestation. Mom is a cocaine user, and will not be providing EBm.   Maternal Data Formula Feeding for Exclusion: Yes Reason for exclusion: Substance abuse and/or alcohol abuse Has patient been taught Hand Expression?: No  Feeding    LATCH Score                   Interventions    Lactation Tools Discussed/Used     Consult Status Consult Status: Complete    Alfred LevinsLee, Demira Gwynne Anne 04/10/2017, 10:22 AM

## 2017-04-10 NOTE — MAU Provider Note (Signed)
Susan [name] is a 41 y.o. [id number] at 1849w5d who presents as transfer from [facility]. She reports bleeding and contractions since yesterday morning. Patient with hx of polysubstance abuse, reports last cocaine use was about 3 days.   O: FHT 140, moderate with 10x10 accels, no decels Toco: q 2 mins External: no lesion Vagina: only able to see BBOW on spec exam.  Cervix: did not want to check too forcefully around the bag. No cervix felt. BBOW to about +2 station.   A/P:  Admit to labor and delivery  Fellow notified BMZ ordered Mag for neuro protection   Thressa ShellerHeather Altus Zaino 1:45 AM 04/10/17

## 2017-04-10 NOTE — Progress Notes (Signed)
Tcf: [facility] ED, Patient 26 weeks, 3 days gestation, G9P4, to ED with c/o contractions since this morning and vaginal bleeding. Patient states she did cocaine and ETOH 3 days ago.  Patient placed on EFM by [facility] ED, unable to pull tracing up to review.  Troubleshooting system with nurse and still unable to see/review tracing.  RN states transport now in room to transfer patient to MAU..Marland Kitchen

## 2017-04-10 NOTE — ED Notes (Signed)
Rapid response [facility] at [facility] notified of pt being on monitor,

## 2017-04-10 NOTE — MAU Note (Signed)
PT  HAS ARRIVED   AS TRANSFER FROM [facility]-   SAYS  ABD PAIN STARTED  FRI NIGHT - HURT ALL DAY SAT  .   VAG BLEEDING STARTED  SAT  AM.    WENT  TO [facility] TONIGHT AT 2330.  LAST SEX-  1 WEEK AGO

## 2017-04-11 MED ORDER — MEDROXYPROGESTERONE ACETATE 150 MG/ML IM SUSP
150.0000 mg | Freq: Once | INTRAMUSCULAR | Status: AC
  Administered 2017-04-11: 150 mg via INTRAMUSCULAR
  Filled 2017-04-11: qty 1

## 2017-04-11 NOTE — Progress Notes (Signed)
Post Partum Day 1 Subjective: no complaints, up ad lib, voiding and tolerating PO  Objective: Blood pressure 107/62, pulse (!) 59, temperature 98.1 F (36.7 C), temperature source Oral, resp. rate 18, height 5\' 1"  (1.549 m), weight 140 lb (63.5 kg), SpO2 99 %, unknown if currently breastfeeding.  Physical Exam:  General: alert and no distress Lochia: appropriate Uterine Fundus: firm DVT Evaluation: No evidence of DVT seen on physical exam. Negative Homan's sign.   Recent Labs  04/10/17 0001 04/10/17 0319  HGB 10.2* 9.6*  HCT 29.1* 27.7*    Assessment/Plan: Plan for discharge tomorrow, Social Work consult and Contraception Depo Provera   LOS: 1 day   Jaynie CollinsUgonna Jhostin Epps, MD 04/11/2017, 9:25 AM

## 2017-04-11 NOTE — Clinical Social Work Maternal (Signed)
CLINICAL SOCIAL WORK MATERNAL/CHILD NOTE  Patient Details  Name: Susan [name] MRN: [id number] Date of Birth: 23-Nov-1975  Date:  04/11/2017  Clinical Social Worker Initiating Note:  Susan [name] Date/ Time Initiated:  04/11/17/1238     Child's Name:  Susan [name]   Legal Guardian:  Mother (Per [name]; FOB is unknown)   Need for Interpreter:  None   Date of Referral:  04/10/17     Reason for Referral:  Behavioral Health Issues, including SI , Current Substance Use/Substance Use During Pregnancy , Parental Support of Premature Babies < 32 weeks/or Critically Ill babies    Referral Source:  [facility]   Address:  [place], [place] [address]  Phone number:  [phone]   Household Members:  Self, Siblings, Parents ([name] resides with [name]'s mother Susan [name] [phone]) and [name]'s younger sister.)   Natural Supports (not living in the home):  Parent ([name]'s father will provide support.)   Professional Supports: Other (Comment) ([name]'s peer support specialist is Susan [name] [phone])   Employment: Unemployed   Type of Work:     Education:  9 to 11 years   Museum/gallery curator Resources:  [employer]   Other Resources:  [employer], Physicist, medical    Cultural/Religious Considerations Which May Impact Care:  Per [employer], [name]'s religion is Primary school teacher.   Strengths:  Understanding of illness   Risk Factors/Current Problems:  DHHS Involvement , Mental Health Concerns , Transportation , Basic Needs , Substance Use    Cognitive State:  Alert , Able to Concentrate , Linear Thinking    Mood/Affect:  Happy , Relaxed , Comfortable , Interested    CSW Assessment: CSW met with [name] to complete an assessment for hx of SA, MH hx, and CPS hx.  When CSW arrived, [name] was eating lunch in bed.  [name] gave CSW permission to meet with [name] while [name] was having lunch.  [name] was polite, forthcoming, and receptive to meeting with CSW.  Before CSW could initiate assessment,  [name] asked "When will I get to take my baby home".  CSW explained to [name] that [name]'s infant is premature and has some medical concerns.  CSW encouraged [name] not to focus on a discharge date for infant but to focus on knowing that infant is getting the medical interventions that are need at this time; [name] was understanding.  CSW informed [name] of NICU visitation policy and encouraged [name] to visit as often as possible.  CSW assessed [name] for barriers to visitation and [name] dated that transportation will be a barrier.  [name] communicated that [name] does not have a car and does not know of anyone that will be able to bring [name] to visit with infant.  CSW provided [name] with information for Medicaid transportation and encouraged [name] to call as soon as possible to make daily transportation arrangements; [name] agreed.  [name] reported having all necessary items for infant (although infant is premature) and feelings prepared to parent.   CSW inquired about [name]'s MH hx.  [name] reported a hx of depression and PTSD.  Although [name] was not a good historian with recalling when and why [name] was dx, [name] denied currently taking any medications. [name] reported [name]'s last known prescribed medication was Prozac and per [name], [name] discontinued the use after pregnancy confirmation.  [name] stated that [name]'s medication was managed through [facility]. CSW assessed for safety and [name] denied SI and HI.  CSW provided education regarding Baby Blues vs PMADs. CSW  encouraged [name] to evaluate her mental health throughout the postpartum period with the use of the New Mom Checklist developed by [facility] and notify a medical professional if symptoms arise.  CSW offered [name] resources for MH interventions and [name] declined.  [name] stated that [name] has a peer support specialist that will assist [name] with [name]'s MH and SA needs. [name] did not present with any acute MH signs or symptoms.  [name] denied PPD with [name]'s older 3 children and acknowledged PPD symptoms with [name]'s 4th  child. [name] attributed [name]'s symptoms to [facility] CPS involvement  [name] reported that [name] does not currently have custody of [name]'s older 4 children (Susan [name] 11/07/2005 resides with paternal aunt, Susan [name] 08/02/16 currently in foster care, Susan [name] 01/22/1999 resides with [name]'s father, and Susan [name] 12/19/2002 resides with bio father). [name] acknowledged CPS hx and reported that [name] is currently not working [name]'s reunification plan to assume custody of [name]'s older children. CSW made [name] aware of infant's UDS results and explained that CSW will make a report to [facility] CPS.  [name] denied having any questions and informed CSW that [name] is familiar with CPS investigation process. CSW offered [name] resources for SA counseling and [name] declined and stated, "I have peer support specialist".  [name] disclosed [name]'s last use of cocaine was about 6 days ago and [name] stated [name] used to help with [name]'s depression.  CSW encouraged [name] to utilize [name]'s peer support specialist when [name] starts to exhibit depressive symptoms or experience substance cravings.   CSW thanked [name] for meeting with CSW and provided [name] with CSW's contact information.  CSW informed [name] that CSW will assess [name] for barriers, concerns, and needs weekly while infant remains in NICU.  CSW left a message with [facility] CPS to initiate a CPS report.  At this time, there will be barriers to d/c until CPS is able to provide CSW with a disposition plan.   CSW Plan/Description:  Child Protective Service Report , Information/Referral to [employer] , Psychosocial Support and Ongoing Assessment of Needs, Patient/Family Education  (CSW will report CDS results to [place], MSW, [employer] Clinical Social Work [phone]  Susan [name], [place] 04/11/2017, 12:45 PM

## 2017-04-12 ENCOUNTER — Encounter: Payer: Self-pay | Admitting: *Deleted

## 2017-04-12 LAB — HEPATITIS C ANTIBODY: HCV Ab: 0.1 s/co ratio (ref 0.0–0.9)

## 2017-04-12 MED ORDER — IBUPROFEN 600 MG PO TABS: 600.0000 mg | ORAL_TABLET | Freq: Four times a day (QID) | ORAL | 0 refills | Status: DC

## 2017-04-12 MED ORDER — MEDROXYPROGESTERONE ACETATE 150 MG/ML IM SUSP
150.0000 mg | Freq: Once | INTRAMUSCULAR | Status: AC
  Administered 2017-04-12: 150 mg via INTRAMUSCULAR
  Filled 2017-04-12: qty 1

## 2017-04-12 NOTE — Progress Notes (Signed)
Discharge to home with family ambulated out

## 2017-04-12 NOTE — Discharge Instructions (Signed)

## 2017-04-12 NOTE — Progress Notes (Signed)
CSW made [place] CPS report with intake worker, Yolanda Tinsley.  CPS will follow-up with CSW regarding infant's disposition plan prior to infant's d/c.   There are barriers to infant d/c to [name].  Doug Bucklin Boyd-Gilyard, MSW, LCSW Clinical Social Work [phone]    

## 2017-04-12 NOTE — Discharge Summary (Signed)
Physician Discharge Summary  Patient ID: Gwendolyn Filludrey W Madani MRN: [id number] DOB/AGE: 04-27-1976 41 y.o.  Admit date: 04/09/2017 Discharge date: 04/12/2017  Admission Diagnoses: Preterm labor, cocaine abuse Discharge Diagnoses:  Active Problems:   Cocaine abuse affecting pregnancy, antepartum   Preterm labor   Discharged Condition: good  Hospital Course: presented complete and breech and quickly delivered, see delivery note  Consults: None  Significant Diagnostic Studies: labs:   Treatments:   Discharge Exam: Blood pressure (!) 98/55, pulse 66, temperature 98.4 F (36.9 C), temperature source Oral, resp. rate 16, height 5\' 1"  (1.549 m), weight 140 lb (63.5 kg), SpO2 100 %, unknown if currently breastfeeding.   Disposition: 01-Home or Self Care  Discharge Instructions    Activity as tolerated    Complete by:  As directed    Call MD for:  persistant nausea and vomiting    Complete by:  As directed    Call MD for:  temperature >100.4    Complete by:  As directed    Diet - low sodium heart healthy    Complete by:  As directed    Sexual acrtivity    Complete by:  As directed    No sex for 6 weeks     Allergies as of 04/12/2017   No Known Allergies     Medication List    TAKE these medications   ibuprofen 600 MG tablet Commonly known as:  ADVIL,MOTRIN Take 1 tablet (600 mg total) by mouth every 6 (six) hours.   PNV PRENATAL PLUS MULTIVITAMIN 27-1 MG Tabs Take 1 tablet by mouth daily.            Discharge Care Instructions        Start     Ordered   04/12/17 0000  Diet - low sodium heart healthy     04/12/17 0728   04/12/17 0000  ibuprofen (ADVIL,MOTRIN) 600 MG tablet  Every 6 hours     04/12/17 0728   04/12/17 0000  Call MD for:  temperature >100.4     04/12/17 0728   04/12/17 0000  Call MD for:  persistant nausea and vomiting     04/12/17 0728   04/12/17 0000  Activity as tolerated     04/12/17 0728   04/12/17 0000  Sexual acrtivity    Comments:  No  sex for 6 weeks   04/12/17 0728     Follow-up Information    Lazaro ArmsEure, Jakori Burkett H, MD Follow up in 4 week(s).   Specialties:  Obstetrics and Gynecology, Radiology Why:  postpartum/pre op tubal ligation Contact information: [address] [place] [address] [phone]           Signed: Lazaro ArmsURE,Ivalene Platte H 04/12/2017, 7:29 AM

## 2017-05-15 IMAGING — US US OB COMP LESS 14 WK
1 series · 13 of 28 positions shown · non-contrast
Comparison: Pelvic ultrasound dated October 16, 2014

CLINICAL DATA: One week history of pelvic pain and white vaginal
discharge ; patient is known to be early pregnant with positive beta
HCG ; history of ectopic pregnancy

EXAM:
OBSTETRIC <14 WK US AND TRANSVAGINAL OB US
TECHNIQUE: Both transabdominal and transvaginal ultrasound examinations were
performed for complete evaluation of the gestation as well as the
maternal uterus, adnexal regions, and pelvic cul-de-sac.
Transvaginal technique was performed to assess early pregnancy.

[Series 1: us ob comp less 14 wk · 0.20mm/px · 13 of 115 slices shown]
[im 5/115]
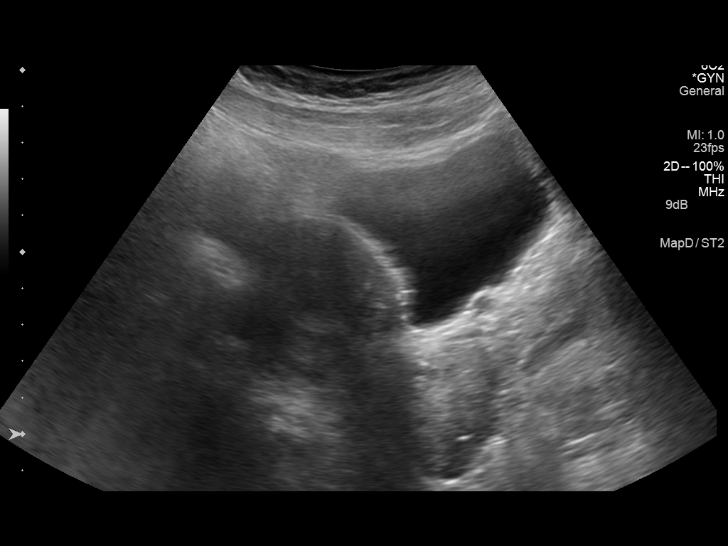
[im 13/115]
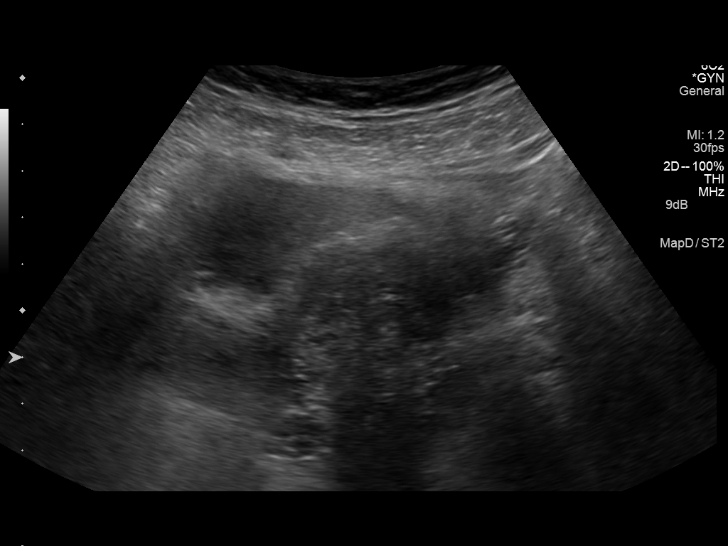
[im 22/115]
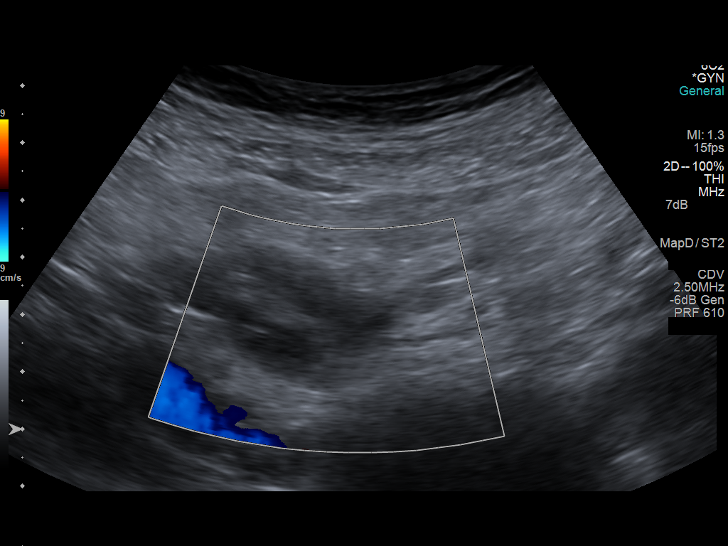
[im 30/115]
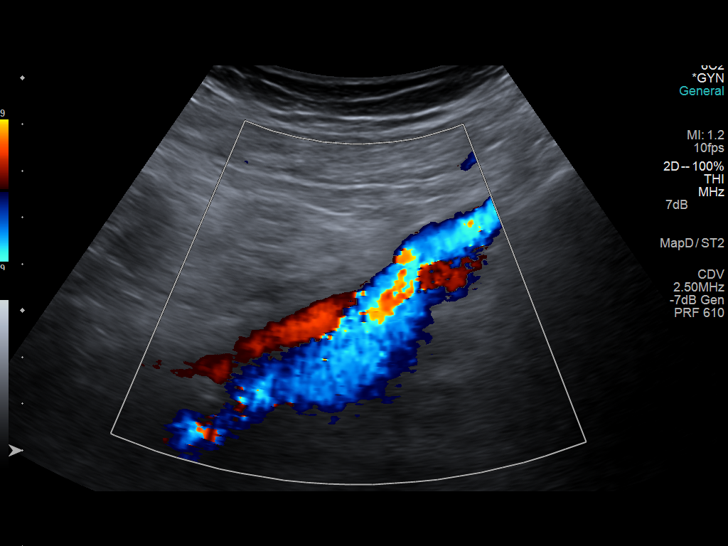
[im 39/115]
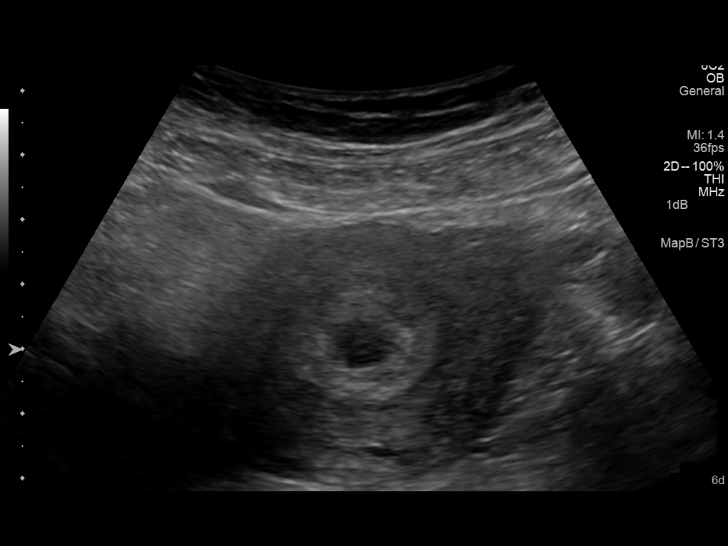
[im 47/115]
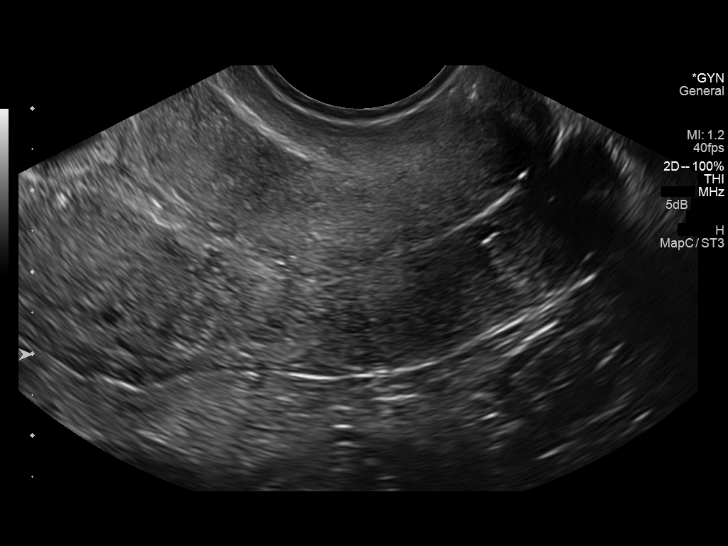
[im 60/115]
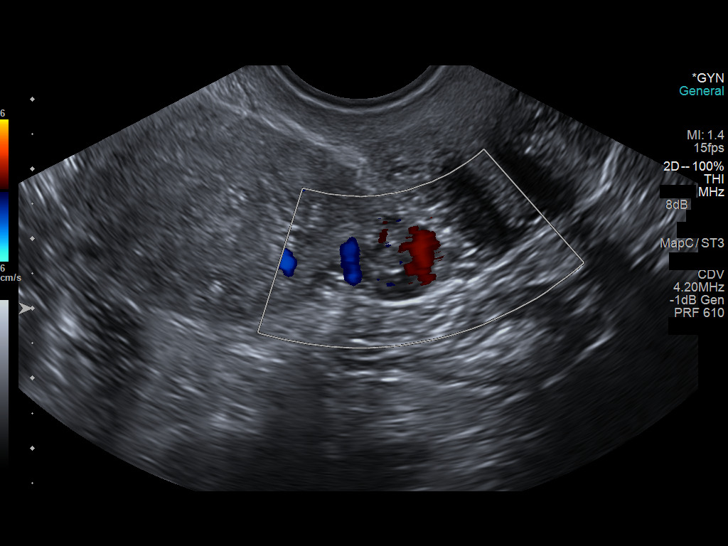
[im 68/115]
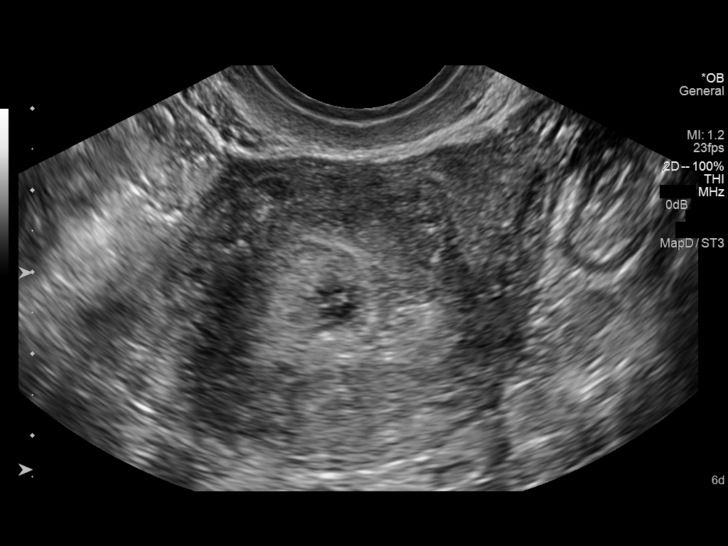
[im 77/115]
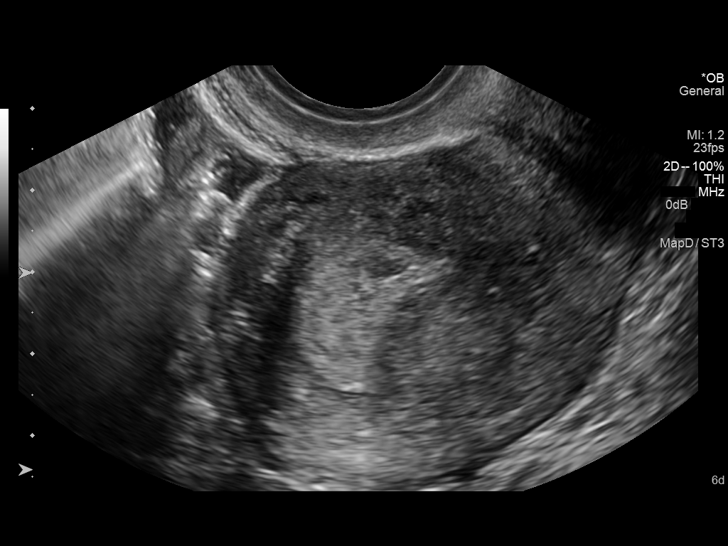
[im 85/115]
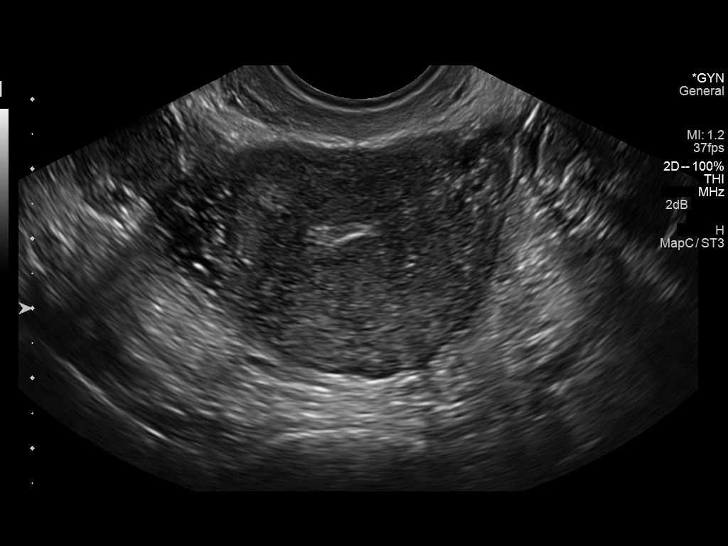
[im 93/115]
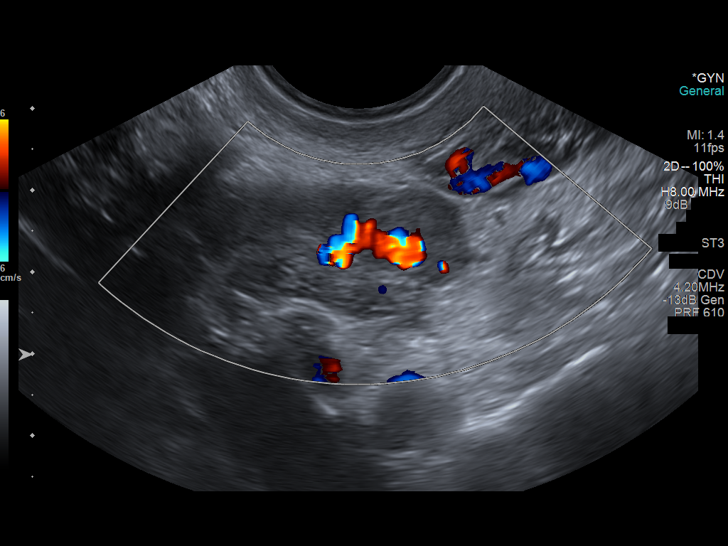
[im 102/115]
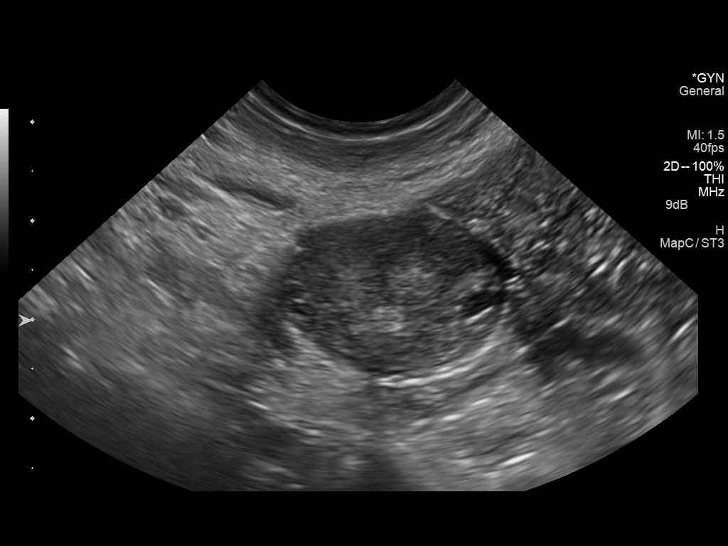
[im 110/115]
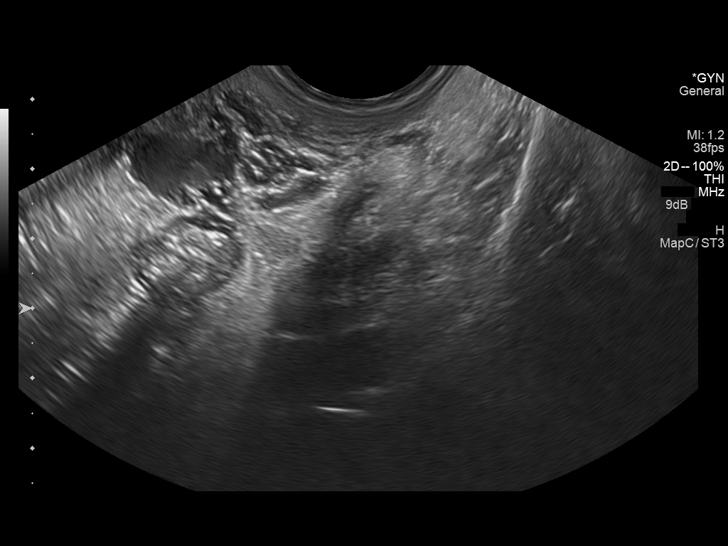

[13 of 28 positions shown; findings below may reference images not displayed]

FINDINGS: Intrauterine gestational sac: Single, normal in shape.

Yolk sac:  Present

Embryo:  None visible

Cardiac Activity: None visible

Heart Rate: N/A.  Bpm

MSD: 10.3  mm   5 w   5  d

Maternal uterus/adnexae: No subchorionic hemorrhage is demonstrated.
The right ovary is normal in size and exhibits a approximately
mm slightly hyperechoic focus which may reflect a corpus luteum
cyst. The left ovary is not visualized. No left adnexal mass is
demonstrated. There is a small amount of free pelvic fluid.
IMPRESSION: There is an intrauterine fluid collection which likely reflects a
gestational sac and yolk sac but no embryo is visible. The mean sac
diameter corresponds to a 5 week 5 day gestational. The lack of a
visible embryo may reflect an anembryonic pregnancy, recent partial
miscarriage, or very early but normal normal pregnancy. There is no
evidence of an ectopic pregnancy.

No tubo-ovarian abscess is demonstrated. The left ovary could not be
demonstrated.

Obstetrical consultation and serial follow-up beta HCG
determinations and follow-up ultrasound examinations are
recommended.

## 2017-06-04 ENCOUNTER — Encounter (HOSPITAL_COMMUNITY): Payer: Self-pay | Admitting: Nurse Practitioner

## 2017-06-04 ENCOUNTER — Inpatient Hospital Stay (HOSPITAL_COMMUNITY)
Admission: AD | Admit: 2017-06-04 | Discharge: 2017-06-08 | DRG: 897 | Disposition: A | Discharge status: Home / Self Care | Payer: Medicaid Other | Source: Intra-hospital | Attending: Psychiatry | Admitting: Psychiatry

## 2017-06-04 ENCOUNTER — Encounter (HOSPITAL_COMMUNITY): Payer: Self-pay | Admitting: *Deleted

## 2017-06-04 ENCOUNTER — Emergency Department (HOSPITAL_COMMUNITY)
Admission: EM | Admit: 2017-06-04 | Discharge: 2017-06-04 | Disposition: A | Discharge status: Other Acute Inpatient Hospital | Payer: Medicaid Other | Attending: Emergency Medicine | Admitting: Emergency Medicine

## 2017-06-04 DIAGNOSIS — Z79899 Other long term (current) drug therapy: Secondary | ICD-10-CM | POA: Diagnosis not present

## 2017-06-04 DIAGNOSIS — Z7289 Other problems related to lifestyle: Secondary | ICD-10-CM

## 2017-06-04 DIAGNOSIS — F10920 Alcohol use, unspecified with intoxication, uncomplicated: Secondary | ICD-10-CM

## 2017-06-04 DIAGNOSIS — F1410 Cocaine abuse, uncomplicated: Secondary | ICD-10-CM | POA: Diagnosis not present

## 2017-06-04 DIAGNOSIS — G4700 Insomnia, unspecified: Secondary | ICD-10-CM | POA: Diagnosis present

## 2017-06-04 DIAGNOSIS — F1994 Other psychoactive substance use, unspecified with psychoactive substance-induced mood disorder: Secondary | ICD-10-CM | POA: Diagnosis not present

## 2017-06-04 DIAGNOSIS — F1490 Cocaine use, unspecified, uncomplicated: Secondary | ICD-10-CM | POA: Diagnosis not present

## 2017-06-04 DIAGNOSIS — R45851 Suicidal ideations: Secondary | ICD-10-CM | POA: Insufficient documentation

## 2017-06-04 DIAGNOSIS — F17210 Nicotine dependence, cigarettes, uncomplicated: Secondary | ICD-10-CM | POA: Insufficient documentation

## 2017-06-04 DIAGNOSIS — Z046 Encounter for general psychiatric examination, requested by authority: Secondary | ICD-10-CM | POA: Diagnosis not present

## 2017-06-04 DIAGNOSIS — Z91410 Personal history of adult physical and sexual abuse: Secondary | ICD-10-CM | POA: Diagnosis not present

## 2017-06-04 DIAGNOSIS — R440 Auditory hallucinations: Secondary | ICD-10-CM | POA: Diagnosis not present

## 2017-06-04 DIAGNOSIS — R4582 Worries: Secondary | ICD-10-CM | POA: Diagnosis not present

## 2017-06-04 DIAGNOSIS — F10929 Alcohol use, unspecified with intoxication, unspecified: Secondary | ICD-10-CM | POA: Insufficient documentation

## 2017-06-04 DIAGNOSIS — Z818 Family history of other mental and behavioral disorders: Secondary | ICD-10-CM

## 2017-06-04 DIAGNOSIS — R45850 Homicidal ideations: Secondary | ICD-10-CM | POA: Diagnosis not present

## 2017-06-04 DIAGNOSIS — R4587 Impulsiveness: Secondary | ICD-10-CM | POA: Diagnosis not present

## 2017-06-04 DIAGNOSIS — F419 Anxiety disorder, unspecified: Secondary | ICD-10-CM | POA: Diagnosis not present

## 2017-06-04 DIAGNOSIS — R454 Irritability and anger: Secondary | ICD-10-CM | POA: Diagnosis not present

## 2017-06-04 DIAGNOSIS — F323 Major depressive disorder, single episode, severe with psychotic features: Secondary | ICD-10-CM | POA: Diagnosis present

## 2017-06-04 DIAGNOSIS — F329 Major depressive disorder, single episode, unspecified: Secondary | ICD-10-CM | POA: Diagnosis present

## 2017-06-04 DIAGNOSIS — R5383 Other fatigue: Secondary | ICD-10-CM | POA: Diagnosis not present

## 2017-06-04 DIAGNOSIS — J45909 Unspecified asthma, uncomplicated: Secondary | ICD-10-CM | POA: Insufficient documentation

## 2017-06-04 DIAGNOSIS — F1414 Cocaine abuse with cocaine-induced mood disorder: Principal | ICD-10-CM | POA: Diagnosis present

## 2017-06-04 DIAGNOSIS — F4310 Post-traumatic stress disorder, unspecified: Secondary | ICD-10-CM | POA: Diagnosis present

## 2017-06-04 DIAGNOSIS — R450 Nervousness: Secondary | ICD-10-CM | POA: Diagnosis not present

## 2017-06-04 DIAGNOSIS — F22 Delusional disorders: Secondary | ICD-10-CM | POA: Diagnosis not present

## 2017-06-04 DIAGNOSIS — R4583 Excessive crying of child, adolescent or adult: Secondary | ICD-10-CM | POA: Diagnosis not present

## 2017-06-04 DIAGNOSIS — R251 Tremor, unspecified: Secondary | ICD-10-CM | POA: Diagnosis not present

## 2017-06-04 DIAGNOSIS — F1210 Cannabis abuse, uncomplicated: Secondary | ICD-10-CM | POA: Diagnosis not present

## 2017-06-04 LAB — URINALYSIS, ROUTINE W REFLEX MICROSCOPIC
Bilirubin Urine: NEGATIVE
Glucose, UA: NEGATIVE mg/dL
Ketones, ur: NEGATIVE mg/dL
Leukocytes, UA: NEGATIVE
Nitrite: NEGATIVE
Protein, ur: NEGATIVE mg/dL
Specific Gravity, Urine: 1.006 (ref 1.005–1.030)
pH: 6 (ref 5.0–8.0)

## 2017-06-04 LAB — BASIC METABOLIC PANEL
Anion gap: 12 (ref 5–15)
BUN: 6 mg/dL (ref 6–20)
CO2: 24 mmol/L (ref 22–32)
Calcium: 9.1 mg/dL (ref 8.9–10.3)
Chloride: 110 mmol/L (ref 101–111)
Creatinine, Ser: 0.63 mg/dL (ref 0.44–1.00)
GFR calc Af Amer: >60 mL/min (ref >60)
GFR calc non Af Amer: >60 mL/min (ref >60)
Glucose, Bld: 112 mg/dL — ABNORMAL HIGH (ref 65–99)
Potassium: 3.2 mmol/L — ABNORMAL LOW (ref 3.5–5.1)
Sodium: 146 mmol/L — ABNORMAL HIGH (ref 135–145)

## 2017-06-04 LAB — PREGNANCY, URINE: Preg Test, Ur: NEGATIVE

## 2017-06-04 LAB — CBC WITH DIFFERENTIAL/PLATELET
Basophils Absolute: 0 10*3/uL (ref 0.0–0.1)
Basophils Relative: 0 %
Eosinophils Absolute: 0.1 10*3/uL (ref 0.0–0.7)
Eosinophils Relative: 1 %
HCT: 39.1 % (ref 36.0–46.0)
Hemoglobin: 13.4 g/dL (ref 12.0–15.0)
Lymphocytes Relative: 48 %
Lymphs Abs: 4 10*3/uL (ref 0.7–4.0)
MCH: 33.3 pg (ref 26.0–34.0)
MCHC: 34.3 g/dL (ref 30.0–36.0)
MCV: 97 fL (ref 78.0–100.0)
Monocytes Absolute: 0.4 10*3/uL (ref 0.1–1.0)
Monocytes Relative: 5 %
Neutro Abs: 3.8 10*3/uL (ref 1.7–7.7)
Neutrophils Relative %: 46 %
Platelets: 253 10*3/uL (ref 150–400)
RBC: 4.03 MIL/uL (ref 3.87–5.11)
RDW: 12.7 % (ref 11.5–15.5)
WBC: 8.3 10*3/uL (ref 4.0–10.5)

## 2017-06-04 LAB — RAPID URINE DRUG SCREEN, HOSP PERFORMED
Amphetamines: NOT DETECTED
Barbiturates: NOT DETECTED
Benzodiazepines: NOT DETECTED
Cocaine: POSITIVE — AB
Opiates: NOT DETECTED
Tetrahydrocannabinol: NOT DETECTED

## 2017-06-04 LAB — ETHANOL: Alcohol, Ethyl (B): 157 mg/dL — ABNORMAL HIGH (ref <10)

## 2017-06-04 MED ORDER — LORAZEPAM 1 MG PO TABS
1.0000 mg | ORAL_TABLET | Freq: Two times a day (BID) | ORAL | Status: AC
Start: 2017-06-06 — End: 2017-06-06
  Administered 2017-06-06 (×2): 1 mg via ORAL
  Filled 2017-06-04 (×2): qty 1

## 2017-06-04 MED ORDER — ADULT MULTIVITAMIN W/MINERALS CH
1.0000 | ORAL_TABLET | Freq: Every day | ORAL | Status: DC
  Administered 2017-06-04 – 2017-06-08 (×5): 1 via ORAL
  Filled 2017-06-04 (×7): qty 1

## 2017-06-04 MED ORDER — LOPERAMIDE HCL 2 MG PO CAPS: 2.0000 mg | ORAL_CAPSULE | ORAL | Status: AC | PRN

## 2017-06-04 MED ORDER — THIAMINE HCL 100 MG/ML IJ SOLN
100.0000 mg | Freq: Once | INTRAMUSCULAR | Status: AC
  Administered 2017-06-04: 100 mg via INTRAMUSCULAR
  Filled 2017-06-04: qty 2

## 2017-06-04 MED ORDER — LORAZEPAM 1 MG PO TABS
1.0000 mg | ORAL_TABLET | Freq: Three times a day (TID) | ORAL | Status: AC
Start: 2017-06-05 — End: 2017-06-05
  Administered 2017-06-05 (×3): 1 mg via ORAL
  Filled 2017-06-04 (×3): qty 1

## 2017-06-04 MED ORDER — HYDROXYZINE HCL 25 MG PO TABS
25.0000 mg | ORAL_TABLET | Freq: Four times a day (QID) | ORAL | Status: AC | PRN
  Administered 2017-06-05 – 2017-06-06 (×2): 25 mg via ORAL
  Filled 2017-06-04 (×2): qty 1

## 2017-06-04 MED ORDER — VITAMIN B-1 100 MG PO TABS
100.0000 mg | ORAL_TABLET | Freq: Every day | ORAL | Status: DC
  Administered 2017-06-05 – 2017-06-08 (×4): 100 mg via ORAL
  Filled 2017-06-04 (×5): qty 1

## 2017-06-04 MED ORDER — TRAZODONE HCL 50 MG PO TABS
50.0000 mg | ORAL_TABLET | Freq: Every evening | ORAL | Status: DC | PRN
  Administered 2017-06-04 – 2017-06-06 (×4): 50 mg via ORAL
  Filled 2017-06-04 (×9): qty 1

## 2017-06-04 MED ORDER — ONDANSETRON 4 MG PO TBDP
4.0000 mg | ORAL_TABLET | Freq: Four times a day (QID) | ORAL | Status: AC | PRN
  Administered 2017-06-04: 4 mg via ORAL
  Filled 2017-06-04: qty 1

## 2017-06-04 MED ORDER — LORAZEPAM 1 MG PO TABS: 1.0000 mg | ORAL_TABLET | Freq: Four times a day (QID) | ORAL | Status: AC | PRN

## 2017-06-04 MED ORDER — OLANZAPINE 5 MG PO TABS
5.0000 mg | ORAL_TABLET | Freq: Every day | ORAL | Status: DC
  Administered 2017-06-04 – 2017-06-07 (×4): 5 mg via ORAL
  Filled 2017-06-04 (×5): qty 1

## 2017-06-04 MED ORDER — LORAZEPAM 1 MG PO TABS
1.0000 mg | ORAL_TABLET | Freq: Every day | ORAL | Status: AC
Start: 2017-06-07 — End: 2017-06-07
  Administered 2017-06-07: 1 mg via ORAL
  Filled 2017-06-04: qty 1

## 2017-06-04 MED ORDER — ALUM & MAG HYDROXIDE-SIMETH 200-200-20 MG/5ML PO SUSP: 30.0000 mL | ORAL | Status: DC | PRN

## 2017-06-04 MED ORDER — POTASSIUM CHLORIDE CRYS ER 20 MEQ PO TBCR
20.0000 meq | EXTENDED_RELEASE_TABLET | Freq: Two times a day (BID) | ORAL | Status: AC
  Administered 2017-06-04 – 2017-06-06 (×4): 20 meq via ORAL
  Filled 2017-06-04 (×5): qty 1

## 2017-06-04 MED ORDER — MAGNESIUM HYDROXIDE 400 MG/5ML PO SUSP: 30.0000 mL | Freq: Every day | ORAL | Status: DC | PRN

## 2017-06-04 MED ORDER — NICOTINE 21 MG/24HR TD PT24
21.0000 mg | MEDICATED_PATCH | Freq: Every day | TRANSDERMAL | Status: DC
  Administered 2017-06-04 – 2017-06-08 (×5): 21 mg via TRANSDERMAL
  Filled 2017-06-04 (×7): qty 1

## 2017-06-04 MED ORDER — NICOTINE 21 MG/24HR TD PT24
MEDICATED_PATCH | TRANSDERMAL | Status: AC
  Filled 2017-06-04: qty 1

## 2017-06-04 MED ORDER — ACETAMINOPHEN 325 MG PO TABS
650.0000 mg | ORAL_TABLET | Freq: Four times a day (QID) | ORAL | Status: DC | PRN
  Administered 2017-06-07 – 2017-06-08 (×4): 650 mg via ORAL
  Filled 2017-06-04 (×4): qty 2

## 2017-06-04 MED ORDER — LORAZEPAM 1 MG PO TABS
1.0000 mg | ORAL_TABLET | Freq: Four times a day (QID) | ORAL | Status: AC
  Administered 2017-06-04 (×2): 1 mg via ORAL
  Filled 2017-06-04 (×2): qty 1

## 2017-06-04 MED ORDER — FLUOXETINE HCL 10 MG PO CAPS
10.0000 mg | ORAL_CAPSULE | Freq: Every day | ORAL | Status: DC
  Administered 2017-06-04 – 2017-06-06 (×3): 10 mg via ORAL
  Filled 2017-06-04 (×7): qty 1

## 2017-06-04 NOTE — ED Triage Notes (Signed)
Pt reports being an alcoholic and being a drug user. Pt reports recent pregnancy in September and the baby was placed in foster care. Pt reports wanting to kill herself and anyone else who "crosses her path."
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
## 2017-06-04 NOTE — ED Notes (Signed)
Writer called to give [facility]. Spoke to Clydie BraunKaren and stated no one was available to take report she will give my phone to a nurse and call me back when someone is available.

## 2017-06-04 NOTE — ED Notes (Signed)
Attempted call to [facility].  Message left on voicemail to call [facility].
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
## 2017-06-04 NOTE — ED Notes (Signed)
Patient left with [facility].

## 2017-06-04 NOTE — ED Provider Notes (Signed)
[facility] EMERGENCY DEPARTMENT Provider Note   CSN: [id number] Arrival date & time: 06/04/17  0419     History   Chief Complaint Chief Complaint  Patient presents with  . V70.1    HPI Susan [name] is a 41 y.o. female.  Patient is a 41 year old female with past medical history of polysubstance abuse, depression, panic attacks.  She presents today for evaluation of suicidal ideation.  She reports increased drug and alcohol use recently and now has no desire to live.  She is feeling suicidal.  She reports in the past she has cut her wrists.  She feels as though she needs to be "committed to coming to health behavioral health".  She reports recent childbirth of a drug addicted child as a stressor in her life.  She does admit to significant alcohol consumption this evening.   The history is provided by the patient.    Past Medical History:  Diagnosis Date  . Alcohol abuse   . Asthma   . BV (bacterial vaginosis) 03/06/2013  . Chronic dental pain   . Chronic pelvic pain in female   . Depression   . Ectopic pregnancy   . Panic attacks   . Polysubstance abuse (HCC)    cocaine, opiates, marijuana  . Trichomonas vaginitis     Patient Active Problem List   Diagnosis Date Noted  . Preterm labor 04/10/2017  . Asymptomatic bacteriuria during pregnancy in second trimester 03/14/2017  . Late prenatal care in second trimester 03/08/2017  . Supervision of normal pregnancy 03/08/2017  . MDD (major depressive disorder), single episode, severe with psychotic features (HCC) 01/05/2017  . Alcohol use disorder, severe, dependence (HCC) 01/05/2017  . Cocaine use disorder, moderate, dependence (HCC) 01/05/2017  . Cannabis use disorder, moderate, dependence (HCC) 01/05/2017  . Heavy smoker 02/03/2016  . Cocaine abuse affecting pregnancy, antepartum (HCC) 02/03/2016  . Chronic, continuous use of opioids 03/24/2015  . Depression with anxiety 03/24/2015  . Poor dentition 03/24/2015  .  Sprain of ankle, unspecified site 05/28/2009    Past Surgical History:  Procedure Laterality Date  . ECTOPIC PREGNANCY SURGERY      OB History    Gravida Para Term Preterm AB Living   9 5 4 1 4 5    SAB TAB Ectopic Multiple Live Births   1 2 1  0 5       Home Medications    Prior to Admission medications   Medication Sig Start Date End Date Taking? Authorizing Provider  ibuprofen (ADVIL,MOTRIN) 600 MG tablet Take 1 tablet (600 mg total) by mouth every 6 (six) hours. 04/12/17   Lazaro Arms, MD  Prenatal Vit-Fe Fumarate-FA (PNV PRENATAL PLUS MULTIVITAMIN) 27-1 MG TABS Take 1 tablet by mouth daily. 03/08/17   Jacklyn Shell, CNM    Family History Family History  Problem Relation Age of Onset  . COPD Mother   . Stroke Mother   . Heart disease Maternal Grandmother   . Hypertension Maternal Grandmother   . Stroke Maternal Grandfather   . Asthma Sister   . Down syndrome Sister     Social History Social History  Substance Use Topics  . Smoking status: Current Every Day Smoker    Packs/day: 1.00    Types: Cigarettes  . Smokeless tobacco: Never Used  . Alcohol use Yes     Comment: last use 3 days ago,      Allergies   Patient has no known allergies.   Review of Systems Review of  Systems  All other systems reviewed and are negative.    Physical Exam Updated Vital Signs BP 128/68 (BP Location: Left Arm)   Pulse 70   Temp (!) 97.2 F (36.2 C) (Oral)   Resp 18   Ht 5\' 1"  (1.549 m)   Wt 64 kg (141 lb)   LMP 05/29/2017   SpO2 100%   BMI 26.64 kg/m   Physical Exam  Constitutional: She is oriented to person, place, and time. She appears well-developed and well-nourished. No distress.  Odor of alcohol is present  HENT:  Head: Normocephalic and atraumatic.  Eyes: Pupils are equal, round, and reactive to light. EOM are normal.  Neck: Normal range of motion. Neck supple.  Cardiovascular: Normal rate and regular rhythm.  Exam reveals no gallop and no  friction rub.   No murmur heard. Pulmonary/Chest: Effort normal and breath sounds normal. No respiratory distress. She has no wheezes.  Abdominal: Soft. Bowel sounds are normal. She exhibits no distension. There is no tenderness.  Musculoskeletal: Normal range of motion.  Neurological: She is alert and oriented to person, place, and time.  Speech is slurred  Skin: Skin is warm and dry. She is not diaphoretic.  Nursing note and vitals reviewed.    ED Treatments / Results  Labs (all labs ordered are listed, but only abnormal results are displayed) Labs Reviewed  BASIC METABOLIC PANEL  CBC WITH DIFFERENTIAL/PLATELET  ETHANOL  URINALYSIS, ROUTINE W REFLEX MICROSCOPIC  PREGNANCY, URINE  RAPID URINE DRUG SCREEN, HOSP PERFORMED    EKG  EKG Interpretation None       Radiology No results found.  Procedures Procedures (including critical care time)  Medications Ordered in ED Medications - No data to display   Initial Impression / Assessment and Plan / ED Course  I have reviewed the triage vital signs and the nursing notes.  Pertinent labs & imaging results that were available during my care of the patient were reviewed by me and considered in my medical decision making (see chart for details).  Patient with history of polysubstance abuse presenting with complaints of depression suicidal and homicidal ideation.  She appears to be medically cleared although her blood alcohol is 157.  TTS will be consulted and will determine the final disposition.  Final Clinical Impressions(s) / ED Diagnoses   Final diagnoses:  None    New Prescriptions New Prescriptions   No medications on file     Geoffery Lyonselo, Meighan Treto, MD 06/04/17 [phone]

## 2017-06-04 NOTE — Progress Notes (Signed)
Patient attended group and said that her day was a 5.  Nothing excited happened to her today..Marland Kitchen

## 2017-06-04 NOTE — ED Notes (Signed)
Still no contact with [facility].
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
## 2017-06-04 NOTE — ED Notes (Signed)
Pt given breakfast tray

## 2017-06-04 NOTE — BHH Suicide Risk Assessment (Signed)
[facility] Admission Suicide Risk Assessment   Nursing information obtained from:    Demographic factors:    Current Mental Status:    Loss Factors:    Historical Factors:    Risk Reduction Factors:     Total Time spent with patient: 20 minutes Principal Problem: Substance induced mood disorder (HCC) Diagnosis:   Patient Active Problem List   Diagnosis Date Noted  . Substance induced mood disorder (HCC) [F19.94] 06/04/2017  . Preterm labor [O60.00] 04/10/2017  . Asymptomatic bacteriuria during pregnancy in second trimester [O99.89, R82.71] 03/14/2017  . Late prenatal care in second trimester [O09.32] 03/08/2017  . Supervision of normal pregnancy [Z34.90] 03/08/2017  . MDD (major depressive disorder), single episode, severe with psychotic features (HCC) [F32.3] 01/05/2017  . Alcohol use disorder, severe, dependence (HCC) [F10.20] 01/05/2017  . Cocaine use disorder, moderate, dependence (HCC) [F14.20] 01/05/2017  . Cannabis use disorder, moderate, dependence (HCC) [F12.20] 01/05/2017  . Heavy smoker [F17.200] 02/03/2016  . Cocaine abuse affecting pregnancy, antepartum (HCC) [Z61.096, F14.10] 02/03/2016  . Chronic, continuous use of opioids [F11.90] 03/24/2015  . Depression with anxiety [F41.8] 03/24/2015  . Poor dentition [K08.9] 03/24/2015  . Sprain of ankle, unspecified site [S93.409A] 05/28/2009   Subjective Data:  41 y.o Caucasian female, unemployed, lives with her mother. Background history of SUD and mood disorder. Presented to [facility] ER unaccompanied. Reported feeling overwhelmed. Reported worsening depression and anxiety. Reported command auditory hallucination. Expressed command to hurt others and hurt herself. Intoxicated with cocaine and alcohol. Patient has past history of self harming behavior. Very chaotic lifestyle. She had a baby in September but does not have custody of the baby. Has five other kids and does not have custody of any of them. Says she has been stressed by  being separated from her youngest child. She was supposed to have a visit on Wednesday. Reports feeling calmer since taking medications here. She is feeling drowsy now. Voices are less intense. No longer having homicidal thoughts. Says suicidal thoughts are minimal. She has agreed to take medications as prescribed.    Continued Clinical Symptoms:  Alcohol Use Disorder Identification Test Final Score (AUDIT): 4 The "Alcohol Use Disorders Identification Test", Guidelines for Use in Primary Care, Second Edition.  World Science writer [name]). Score between 0-7:  no or low risk or alcohol related problems. Score between 8-15:  moderate risk of alcohol related problems. Score between 16-19:  high risk of alcohol related problems. Score 20 or above:  warrants further diagnostic evaluation for alcohol dependence and treatment.   CLINICAL FACTORS:   Alcohol/Substance Abuse/Dependencies   Musculoskeletal: Strength & Muscle Tone: Unable to assess at this time.  Gait & Station:  Unable to assess at this time.  Patient leans: Unable to assess at this time.   Psychiatric Specialty Exam: Physical Exam  Constitutional: No distress.  Respiratory: Effort normal.    ROS  Blood pressure (!) 110/54, pulse 91, temperature 98.8 F (37.1 C), temperature source Oral, resp. rate 16, height 5\' 1"  (1.549 m), weight 62.1 kg (137 lb), last menstrual period 05/29/2017, SpO2 99 %, unknown if currently breastfeeding.Body mass index is 25.89 kg/m.  General Appearance: Overweight, drowsy, in bed. Could hardly stay alert.   Eye Contact:  Minimal  Speech:  Normal Rate and Slow  Volume:  Decreased  Mood:  Depressed and overwhelmed.  Affect:  Flat  Thought Process:  Linear  Orientation:  Unable to assess at this time.   Thought Content:  Hallucinations: Auditory  Suicidal Thoughts:  Yes.  without intent/plan  Homicidal Thoughts:  Yes.  without intent/plan  Memory:  Unable to assess at this time.   Judgement:   Poor  Insight:  Fair  Psychomotor Activity:  Decreased  Concentration:  Concentration: Poor and Attention Span: Poor  Recall:  Poor  Fund of Knowledge:  Unable to assess at this time.   Language:  Fair  Akathisia:  Negative  Handed:    AIMS (if indicated):     Assets:  Housing  ADL's:  Impaired  Cognition:  Impaired,  Moderate  Sleep:         COGNITIVE FEATURES THAT CONTRIBUTE TO RISK:  Loss of executive function    SUICIDE RISK:   Moderate:  Frequent suicidal ideation with limited intensity, and duration, some specificity in terms of plans, no associated intent, good self-control, limited dysphoria/symptomatology, some risk factors present, and identifiable protective factors, including available and accessible social support.  PLAN OF CARE:  1. Suicide precautions 2. Olanzapine to target psychosis 3. Fluoxetine to target depression.   I certify that inpatient services furnished can reasonably be expected to improve the patient's condition.   Georgiann CockerVincent A Ezri Fanguy, MD 06/04/2017, 4:41 PM

## 2017-06-04 NOTE — ED Notes (Addendum)
[facility].

## 2017-06-05 DIAGNOSIS — R5383 Other fatigue: Secondary | ICD-10-CM

## 2017-06-05 DIAGNOSIS — F39 Unspecified mood [affective] disorder: Secondary | ICD-10-CM

## 2017-06-05 DIAGNOSIS — F1994 Other psychoactive substance use, unspecified with psychoactive substance-induced mood disorder: Secondary | ICD-10-CM

## 2017-06-05 DIAGNOSIS — R454 Irritability and anger: Secondary | ICD-10-CM

## 2017-06-05 DIAGNOSIS — F419 Anxiety disorder, unspecified: Secondary | ICD-10-CM

## 2017-06-05 DIAGNOSIS — R4587 Impulsiveness: Secondary | ICD-10-CM

## 2017-06-05 DIAGNOSIS — F22 Delusional disorders: Secondary | ICD-10-CM

## 2017-06-05 DIAGNOSIS — R450 Nervousness: Secondary | ICD-10-CM

## 2017-06-05 DIAGNOSIS — R440 Auditory hallucinations: Secondary | ICD-10-CM

## 2017-06-05 DIAGNOSIS — F1210 Cannabis abuse, uncomplicated: Secondary | ICD-10-CM

## 2017-06-05 DIAGNOSIS — G4700 Insomnia, unspecified: Secondary | ICD-10-CM

## 2017-06-05 DIAGNOSIS — R4582 Worries: Secondary | ICD-10-CM

## 2017-06-05 DIAGNOSIS — F1410 Cocaine abuse, uncomplicated: Secondary | ICD-10-CM

## 2017-06-05 DIAGNOSIS — Z818 Family history of other mental and behavioral disorders: Secondary | ICD-10-CM

## 2017-06-05 DIAGNOSIS — R45851 Suicidal ideations: Secondary | ICD-10-CM

## 2017-06-05 DIAGNOSIS — R4583 Excessive crying of child, adolescent or adult: Secondary | ICD-10-CM

## 2017-06-05 LAB — LIPID PANEL
Cholesterol: 119 mg/dL (ref 0–200)
HDL: 65 mg/dL (ref >40)
LDL Cholesterol: 23 mg/dL (ref 0–99)
Total CHOL/HDL Ratio: 1.8 RATIO
Triglycerides: 156 mg/dL — ABNORMAL HIGH (ref <150)
VLDL: 31 mg/dL (ref 0–40)

## 2017-06-05 LAB — POTASSIUM: Potassium: 3.8 mmol/L (ref 3.5–5.1)

## 2017-06-05 LAB — TSH: TSH: 2.84 u[IU]/mL (ref 0.350–4.500)

## 2017-06-05 NOTE — Progress Notes (Signed)
D: Patient is currently lying in bed.  She is lethargic and drowsy.  Patient states she is sleeping poor; her appetite is fair; her energy level remains low and her concentration is poor.  She rates her depression as an 8; anxiety as a 9; hopelessness as a 5.  Patient reports withdrawal symptoms as tremors, chilling, agitation, nausea and irritability.  She has passive thoughts of self harm without a specific plan.  She does not appear to be responding to internal stimuli.  Her goal today is to "get up."  Patient is pleasant with staff.  She remains on the ativan protocol and it is making her sleepy.  Patient wants to make an attempt to get up for lunch. A: Continue to monitor medication management and MD orders.  Safety checks completed every 15 minutes per protocol.  Offer support and encouragement as needed. R: Patient is receptive to staff; she remains isolative to her room.

## 2017-06-05 NOTE — Progress Notes (Signed)
[facility]  06/05/2017 4:27 PM Gwendolyn Filludrey W Rayburn  MRN:  [id number]  Subjective: Susan [name] reports, "I feel dizzy, nauseated & sleepy. That is why I'm lying down in bed. My mood is up & down. I keep hearing voices telling me to hurt myself".  Objective: 41 y.o Caucasian female, unemployed, lives with her mother. Background history of SUD and mood disorder. Presented to [facility] unaccompanied. Reported feeling overwhelmed. Reported worsening depression and anxiety. Reported command auditory hallucination. Expressed command to hurt others and hurt herself. Intoxicated with cocaine and alcohol. Patient has past history of self harming behavior. Very chaotic lifestyle. She had a baby in September but does not have custody of the baby. Has five other kids and does not have custody of any of them. Says she has been stressed by being separated from her youngest child. 06-05-17; Susan [name] is seen, chart reviewed. She presents, alert, oriented x 3 & aware of situation. However, she says she is feeling nauseated, drowsy & sleepy. No vomiting reported. She is lying down in bed with her eyes closed. She is not currently visible on the unit. She is not attending groups. She is receiving alcohol detoxification as well mood stabilization treatments. She is endorsing auditory hallucinations she says telling her to hurt herself. She denies any plans or intent to hurt herself. She is able to contract for safety, verbally. She is endorsing her mood being up & down. She presents with a restricted affects. She does not appears as if responding to any internal stimuli. So far, she is tolerating her medications well. Staff continue to monitor & provide support.  Principal Problem: Substance induced mood disorder (HCC)  Diagnosis:   Patient Active Problem List   Diagnosis Date Noted  . Substance induced mood disorder (HCC) [F19.94] 06/04/2017  . Preterm labor [O60.00] 04/10/2017  . Asymptomatic bacteriuria during  pregnancy in second trimester [O99.89, R82.71] 03/14/2017  . Late prenatal care in second trimester [O09.32] 03/08/2017  . Supervision of normal pregnancy [Z34.90] 03/08/2017  . MDD (major depressive disorder), single episode, severe with psychotic features (HCC) [F32.3] 01/05/2017  . Alcohol use disorder, severe, dependence (HCC) [F10.20] 01/05/2017  . Cocaine use disorder, moderate, dependence (HCC) [F14.20] 01/05/2017  . Cannabis use disorder, moderate, dependence (HCC) [F12.20] 01/05/2017  . Heavy smoker [F17.200] 02/03/2016  . Cocaine abuse affecting pregnancy, antepartum (HCC) [N82.956[O99.320, F14.10] 02/03/2016  . Chronic, continuous use of opioids [F11.90] 03/24/2015  . Depression with anxiety [F41.8] 03/24/2015  . Poor dentition [K08.9] 03/24/2015  . Sprain of ankle, unspecified site [S93.409A] 05/28/2009   Total Time spent with patient: 25 minutes  Past Psychiatric History: See H&P  Past Medical History:  Past Medical History:  Diagnosis Date  . Alcohol abuse   . Asthma   . BV (bacterial vaginosis) 03/06/2013  . Chronic dental pain   . Chronic pelvic pain in female   . Depression   . Ectopic pregnancy   . Panic attacks   . Polysubstance abuse (HCC)    cocaine, opiates, marijuana  . Trichomonas vaginitis     Past Surgical History:  Procedure Laterality Date  . ECTOPIC PREGNANCY SURGERY     Family History:  Family History  Problem Relation Age of Onset  . COPD Mother   . Stroke Mother   . Heart disease Maternal Grandmother   . Hypertension Maternal Grandmother   . Stroke Maternal Grandfather   . Asthma Sister   . Down syndrome Sister    Family Psychiatric  History:  See H&P  Social History:  History  Alcohol Use  . Yes    Comment: last use 3 days ago,      History  Drug Use  . Types: Marijuana, Cocaine    Comment: last week    Social History   Social History  . Marital status: Single    Spouse name: N/A  . Number of children: N/A  . Years of  education: N/A   Social History Main Topics  . Smoking status: Current Every Day Smoker    Packs/day: 1.00    Types: Cigarettes  . Smokeless tobacco: Never Used  . Alcohol use Yes     Comment: last use 3 days ago,   . Drug use: Yes    Types: Marijuana, Cocaine     Comment: last week  . Sexual activity: Yes    Birth control/ protection: None   Other Topics Concern  . None   Social History Narrative  . None   Additional Social History:   Sleep: Good  Appetite:  Good  Current Medications: Current Facility-Administered Medications  Medication Dose Route Frequency Provider Last Rate Last Dose  . acetaminophen (TYLENOL) tablet 650 mg  650 mg Oral Q6H PRN Kerry Hough, PA-C      . alum & mag hydroxide-simeth (MAALOX/MYLANTA) 200-200-20 MG/5ML suspension 30 mL  30 mL Oral Q4H PRN Kerry Hough, PA-C      . FLUoxetine (PROZAC) capsule 10 mg  10 mg Oral Daily Oneta Rack, NP   10 mg at 06/05/17 1610  . hydrOXYzine (ATARAX/VISTARIL) tablet 25 mg  25 mg Oral Q6H PRN Donell Sievert E, PA-C      . loperamide (IMODIUM) capsule 2-4 mg  2-4 mg Oral PRN Kerry Hough, PA-C      . LORazepam (ATIVAN) tablet 1 mg  1 mg Oral Q6H PRN Kerry Hough, PA-C      . LORazepam (ATIVAN) tablet 1 mg  1 mg Oral TID Donell Sievert E, PA-C   1 mg at 06/05/17 1138   Followed by  . [START ON 06/06/2017] LORazepam (ATIVAN) tablet 1 mg  1 mg Oral BID Donell Sievert E, PA-C       Followed by  . [START ON 06/07/2017] LORazepam (ATIVAN) tablet 1 mg  1 mg Oral Daily Simon, Spencer E, PA-C      . magnesium hydroxide (MILK OF MAGNESIA) suspension 30 mL  30 mL Oral Daily PRN Kerry Hough, PA-C      . multivitamin with minerals tablet 1 tablet  1 tablet Oral Daily Kerry Hough, PA-C   1 tablet at 06/05/17 0738  . nicotine (NICODERM CQ - dosed in mg/24 hours) patch 21 mg  21 mg Transdermal Daily Oneta Rack, NP   21 mg at 06/05/17 0737  . OLANZapine (ZYPREXA) tablet 5 mg  5 mg Oral QHS  Oneta Rack, NP   5 mg at 06/04/17 2102  . ondansetron (ZOFRAN-ODT) disintegrating tablet 4 mg  4 mg Oral Q6H PRN Kerry Hough, PA-C   4 mg at 06/04/17 1819  . potassium chloride SA (K-DUR,KLOR-CON) CR tablet 20 mEq  20 mEq Oral BID Oneta Rack, NP   20 mEq at 06/05/17 0738  . thiamine (VITAMIN B-1) tablet 100 mg  100 mg Oral Daily Kerry Hough, PA-C   100 mg at 06/05/17 9604  . traZODone (DESYREL) tablet 50 mg  50 mg Oral QHS,MR X 1 Simon, Mena Goes, PA-C  50 mg at 06/04/17 2102    Lab Results:  Results for orders placed or performed during the hospital encounter of 06/04/17 (from the past 48 hour(s))  Potassium     Status: None   Collection Time: 06/05/17  6:29 AM  Result Value Ref Range   Potassium 3.8 3.5 - 5.1 mmol/L    Comment: Performed at [facility], [address], [place], [place] [address]  TSH     Status: None   Collection Time: 06/05/17  6:29 AM  Result Value Ref Range   TSH 2.840 0.350 - 4.500 uIU/mL    Comment: Performed by a 3rd Generation assay with a functional sensitivity of <=0.01 uIU/mL. Performed at [facility], [address], [place], [place] [address]   Lipid panel     Status: Abnormal   Collection Time: 06/05/17  6:29 AM  Result Value Ref Range   Cholesterol 119 0 - 200 mg/dL   Triglycerides 098 (H) <150 mg/dL   HDL 65 >11 mg/dL   Total CHOL/HDL Ratio 1.8 RATIO   VLDL 31 0 - 40 mg/dL   LDL Cholesterol 23 0 - 99 mg/dL    Comment:        Total Cholesterol/HDL:CHD Risk Coronary Heart Disease Risk Table                     Men   Women  1/2 Average Risk   3.4   3.3  Average Risk       5.0   4.4  2 X Average Risk   9.6   7.1  3 X Average Risk  23.4   11.0        Use the calculated Patient Ratio above and the CHD Risk Table to determine the patient's CHD Risk.        ATP III CLASSIFICATION (LDL):  <100     mg/dL   Optimal  914-782  mg/dL   Near or Above                    Optimal  130-159   mg/dL   Borderline  956-213  mg/dL   High  >086     mg/dL   Very High Performed at [facility], [address] [place], [place] [address]     Blood Alcohol level:  Lab Results  Component Value Date   ETH 157 (H) 06/04/2017   ETH <5 04/10/2017   Metabolic Disorder Labs: Lab Results  Component Value Date   HGBA1C 5.3 01/06/2017   MPG 105 01/06/2017   Lab Results  Component Value Date   PROLACTIN 54.3 (H) 01/06/2017   Lab Results  Component Value Date   CHOL 119 06/05/2017   TRIG 156 (H) 06/05/2017   HDL 65 06/05/2017   CHOLHDL 1.8 06/05/2017   VLDL 31 06/05/2017   LDLCALC 23 06/05/2017   LDLCALC 23 01/06/2017   Physical Findings: AIMS: Facial and Oral Movements Muscles of Facial Expression: None, normal Lips and Perioral Area: None, normal Jaw: None, normal Tongue: None, normal,Extremity Movements Upper (arms, wrists, hands, fingers): None, normal Lower (legs, knees, ankles, toes): None, normal, Trunk Movements Neck, shoulders, hips: None, normal, Overall Severity Severity of abnormal movements (highest score from questions above): None, normal Incapacitation due to abnormal movements: None, normal Patient's awareness of abnormal movements (rate only patient's report): No Awareness, Dental Status Current problems with teeth and/or dentures?: No Does patient usually wear dentures?: No  CIWA:  CIWA-Ar Total: 6 COWS:  COWS Total Score: 0  Musculoskeletal: Strength & Muscle Tone: within normal limits Gait & Station: normal Patient leans: N/A  Psychiatric Specialty Exam: Physical Exam  Nursing note and vitals reviewed.   ROS  Blood pressure (!) 98/57, pulse 84, temperature 98.4 F (36.9 C), temperature source Oral, resp. rate 16, height 5\' 1"  (1.549 m), weight 62.1 kg (137 lb), last menstrual period 05/29/2017, SpO2 99 %, unknown if currently breastfeeding.Body mass index is 25.89 kg/m.  General Appearance: alert, drowsy, casually dressed.   Eye  Contact:  Minimal  Speech:  Normal Rate and Slow  Volume:  Decreased  Mood: Depressed, "My mood is up & down.  Affect:  Flat  Thought Process:  Linear  Orientation: Oriented to time, person, place & situation   Thought Content:  Hallucinations: Auditory, "telling me to hurt myself"  Suicidal Thoughts: Yes.  without intent/plan  Homicidal Thoughts: Yes.  without intent/plan  Memory: Both short & long term memories are grossly intact..   Judgement:  Poor  Insight:  Fair  Psychomotor Activity:  Decreased  Concentration:  Concentration: Fair and Attention Span: Fair  Recall: [employer] of Knowledge: Fair.   Language:  Fair  Akathisia:  Negative  Handed: Right handed.  AIMS (if indicated):     Assets:  Housing  ADL's:  Impaired  Cognition:  Impaired,  Moderate  Sleep: 6.75       Treatment Plan Summary: Daily contact with patient to assess and evaluate symptoms and progress in treatment: Lynnsey is a 41 year old Caucasian female with hx of SUD & mood disorder. She reports on admission feeling overwhelmed with worsening depression & anxiety. Today, she maintains that she is still having auditory hallucinations telling her to hurt herself. She is currently receiving alcohol detoxification treatment as well as mood stabilization treatment.  Will continue today 06/05/17 plan as below except where it is noted. Alcohol detoxification.     -Continue the Ativan detox protocols.  MDD      Continue Fluoxetine 20 mg daily.  Auditory hallucinations.     -Continue Zyprexa 5 mg po Q hs. Insomnia:    -Continue Trazodone 50 mg prn Q hs, may repeat x 1. Nicotine Withdrawal.     -Continue Nicotine patch 21 mg Q 24 hours.      - Continue 15 minutes observation for safety concerns     - Encouraged to participate in milieu therapy and group therapy counseling sessions and also work with coping skills     -  Develop treatment plan to decrease risk of relapse upon discharge and to reduce the need for  readmission.  Armandina Stammer I, NP, PMHMP, FNP-BC 06/05/2017, 4:27 PM

## 2017-06-05 NOTE — Plan of Care (Signed)
Problem: Education: Goal: Understanding of discharge needs will improve Outcome: Not Progressing Patient will need continuing care for her substance abuse issues.  Referrals will need to be made by discharge for continuing care.

## 2017-06-05 NOTE — [facility] Group Notes (Signed)
[facility] Group Notes: (Clinical Social Work)   06/05/2017      Type of Therapy:  Group Therapy   Participation Level:  Did Not Attend despite MHT prompting - arrived for last few minutes and said the music played helped her feel more upbeat.  Ambrose MantleMareida Grossman-Orr, LCSW 06/05/2017, 12:38 PM

## 2017-06-05 NOTE — Progress Notes (Addendum)
D: Pt observed in dayroom withdrawn to self. Pt at the time of assessment endorsed moderate anxiety and depression. Pt denies AVH at this time however; verbalized having command hallucination during the day. "They say I should hurt myself." Pt denied pain or HI.  A: Medications offered as prescribed. All patient's questions and concerns addressed. Support, encouragement, and safe environment provided. 15-minute safety checks continue. R: Pt was med compliant.  Pt attended wrap-up group. Safety checks continue.
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
## 2017-06-06 DIAGNOSIS — F323 Major depressive disorder, single episode, severe with psychotic features: Secondary | ICD-10-CM

## 2017-06-06 DIAGNOSIS — F17210 Nicotine dependence, cigarettes, uncomplicated: Secondary | ICD-10-CM

## 2017-06-06 DIAGNOSIS — F4310 Post-traumatic stress disorder, unspecified: Secondary | ICD-10-CM

## 2017-06-06 DIAGNOSIS — R251 Tremor, unspecified: Secondary | ICD-10-CM

## 2017-06-06 LAB — PROLACTIN: Prolactin: 146.6 ng/mL — ABNORMAL HIGH (ref 4.8–23.3)

## 2017-06-06 MED ORDER — CLOTRIMAZOLE 1 % VA CREA
1.0000 | TOPICAL_CREAM | Freq: Every day | VAGINAL | Status: DC
Start: 2017-06-06 — End: 2017-06-08
  Administered 2017-06-06 – 2017-06-07 (×2): 1 via VAGINAL
  Filled 2017-06-06 (×2): qty 45

## 2017-06-06 MED ORDER — FLUOXETINE HCL 20 MG PO CAPS
20.0000 mg | ORAL_CAPSULE | Freq: Every day | ORAL | Status: DC
  Administered 2017-06-07 – 2017-06-08 (×2): 20 mg via ORAL
  Filled 2017-06-06 (×3): qty 1

## 2017-06-06 NOTE — Progress Notes (Signed)
Recreation Therapy Notes  INPATIENT RECREATION THERAPY ASSESSMENT  Patient Details Name: Susan [name] MRN: [id number] DOB: [birth year] Today's Date: 06/06/2017  Patient Stressors: Family, Relationship, Other (Comment) (People)  Pt stated she was here for suicidal thoughts. Pt stated spouse was a stressor.  Coping Skills:   Isolate, Arguments, Substance Abuse, Avoidance, Self-Injury, Talking, Music  Personal Challenges: Anger, Communication, Concentration, Decision-Making, Expressing Yourself, Problem-Solving, Relationships, Self-Esteem/Confidence, Social Interaction, Stress Management, Substance Abuse, Time Management, Trusting Others, Work Nutritional therapisterformance  Leisure Interests (2+):  Individual - Reading, Individual - TV  Awareness of Community Resources:  Yes  Community Resources:  Library  Current Use: Yes  Patient Strengths:  "I got up this mornig and I'm not too tired"  Patient Identified Areas of Improvement:  Mood swings, anxiety  Current Recreation Participation:  Very rare  Patient Goal for Hospitalization:  "Get on right medications and get mind focused"  [place]  [place]idence[place]  [place]  Current [place] (including self-harm):  No  Current HI:  No  Consent to Intern Participation: N/A   Caroll RancherMarjette Berenize Gatlin, LRT/CTRS  Caroll RancherLindsay, Kniyah Khun A 06/06/2017, 12:38 PM

## 2017-06-06 NOTE — [facility] Group Notes (Signed)
LCSW Group Therapy Note   06/06/2017 1:15pm   Type of Therapy and Topic:  Group Therapy:  Overcoming Obstacles   Participation Level:  Minimal   Description of Group:    In this group patients will be encouraged to explore what they see as obstacles to their own wellness and recovery. They will be guided to discuss their thoughts, feelings, and behaviors related to these obstacles. The group will process together ways to cope with barriers, with attention given to specific choices patients can make. Each patient will be challenged to identify changes they are motivated to make in order to overcome their obstacles. This group will be process-oriented, with patients participating in exploration of their own experiences as well as giving and receiving support and challenge from other group members.   Therapeutic Goals: 1. Patient will identify personal and current obstacles as they relate to admission. 2. Patient will identify barriers that currently interfere with their wellness or overcoming obstacles.  3. Patient will identify feelings, thought process and behaviors related to these barriers. 4. Patient will identify two changes they are willing to make to overcome these obstacles:      Summary of Patient Progress   Stayed the entire time, engaged throughout.  Identified "certain people" as her obstacle, and stated she needs to get a good foundation from here and [facility] "so that I can focus on me instead of others and learn how to say no."   Therapeutic Modalities:   Cognitive Behavioral Therapy Solution Focused Therapy Motivational Interviewing Relapse Prevention Therapy  Ida RogueRodney B Monda Chastain, LCSW 06/06/2017 3:27 PM

## 2017-06-06 NOTE — [facility] Suicide Risk Assessment (Signed)
[facility] INPATIENT:  Family/Significant Other Suicide Prevention Education  Suicide Prevention Education:  Education Completed; Mother, Susan [name] [phone] and peer support specialist, Tollie PizzaLisa Hernandez, [phone] has been identified by the patient as the family member/significant other with whom the patient will be residing, and identified as the person(s) who will aid the patient in the event of a mental health crisis (suicidal ideations/suicide attempt).  With written consent from the patient, the family member/significant other has been provided the following suicide prevention education, prior to the and/or following the discharge of the patient.  The suicide prevention education provided includes the following:  Suicide risk factors  Suicide prevention and interventions  National Suicide Hotline telephone number  [facility] assessment telephone number  [facility] 911  [facility] and/or Residential Mobile Crisis Unit telephone number  Request made of family/significant other to:  Remove weapons (e.g., guns, rifles, knives), all items previously/currently identified as safety concern.    Remove drugs/medications (over-the-counter, prescriptions, illicit drugs), all items previously/currently identified as a safety concern.  The family member/significant other verbalizes understanding of the suicide prevention education information provided.  The family member/significant other agrees to remove the items of safety concern listed above. Mother is fearful patient will end up dead due to on-going substance abuse despite being homeless and having 5 children removed, including one that was just born.  She admitted to bailing her daughter out, and asked that we call her with pt so she could tell her she may not return home and will lose her support if she does not go to rehab.  Susan [name] 06/06/2017, 3:49 PM

## 2017-06-06 NOTE — Progress Notes (Signed)
DAR NOTE: Patient presents with anxious affect and mood.  Reports suicidal thoughts, auditory and visual hallucinations but contracts for safety.  Reports withdrawal symptoms of chilling, agitation, runny nose, cramping, nausea, and irritability on self inventory form.  Rates depression at 9, hopelessness at 5, and anxiety at 9.  Maintained on routine safety checks.  Medications given as prescribed.  Support and encouragement offered as needed.  Attended group and participated.  States goal for today is "stay focus."  Patient visible in the dayroom with minimal interactions.  Reports vaginal discharge and odor.  Md made aware.  Medication prescribed at bedtime.

## 2017-06-06 NOTE — Tx Team (Signed)
Interdisciplinary Treatment and Diagnostic Plan Update  06/06/2017 Time of Session: 8:37 AM  Susan [name] MRN: [id number]  Principal Diagnosis: Substance induced mood disorder (HCC)  Secondary Diagnoses: Principal Problem:   Substance induced mood disorder (HCC)   Current Medications:  Current Facility-Administered Medications  Medication Dose Route Frequency Provider Last Rate Last Dose  . acetaminophen (TYLENOL) tablet 650 mg  650 mg Oral Q6H PRN Kerry Hough, PA-C      . alum & mag hydroxide-simeth (MAALOX/MYLANTA) 200-200-20 MG/5ML suspension 30 mL  30 mL Oral Q4H PRN Kerry Hough, PA-C      . FLUoxetine (PROZAC) capsule 10 mg  10 mg Oral Daily Oneta Rack, NP   10 mg at 06/06/17 0815  . hydrOXYzine (ATARAX/VISTARIL) tablet 25 mg  25 mg Oral Q6H PRN Kerry Hough, PA-C   25 mg at 06/05/17 2133  . loperamide (IMODIUM) capsule 2-4 mg  2-4 mg Oral PRN Kerry Hough, PA-C      . LORazepam (ATIVAN) tablet 1 mg  1 mg Oral Q6H PRN Kerry Hough, PA-C      . LORazepam (ATIVAN) tablet 1 mg  1 mg Oral BID Kerry Hough, PA-C   1 mg at 06/06/17 0813   Followed by  . [START ON 06/07/2017] LORazepam (ATIVAN) tablet 1 mg  1 mg Oral Daily Simon, Spencer E, PA-C      . magnesium hydroxide (MILK OF MAGNESIA) suspension 30 mL  30 mL Oral Daily PRN Kerry Hough, PA-C      . multivitamin with minerals tablet 1 tablet  1 tablet Oral Daily Kerry Hough, PA-C   1 tablet at 06/06/17 1191  . nicotine (NICODERM CQ - dosed in mg/24 hours) patch 21 mg  21 mg Transdermal Daily Oneta Rack, NP   21 mg at 06/05/17 0737  . OLANZapine (ZYPREXA) tablet 5 mg  5 mg Oral QHS Oneta Rack, NP   5 mg at 06/05/17 2134  . ondansetron (ZOFRAN-ODT) disintegrating tablet 4 mg  4 mg Oral Q6H PRN Kerry Hough, PA-C   4 mg at 06/04/17 1819  . thiamine (VITAMIN B-1) tablet 100 mg  100 mg Oral Daily Donell Sievert E, PA-C   100 mg at 06/06/17 0813  . traZODone (DESYREL) tablet 50 mg  50  mg Oral QHS,MR X 1 Kerry Hough, PA-C   50 mg at 06/05/17 2133    PTA Medications: Prescriptions Prior to Admission  Medication Sig Dispense Refill Last Dose  . ibuprofen (ADVIL,MOTRIN) 600 MG tablet Take 1 tablet (600 mg total) by mouth every 6 (six) hours. 30 tablet 0   . Prenatal Vit-Fe Fumarate-FA (PNV PRENATAL PLUS MULTIVITAMIN) 27-1 MG TABS Take 1 tablet by mouth daily. 30 tablet 11 04/09/2017 at Unknown time    Patient Stressors:    Patient Strengths:    Treatment Modalities: Medication Management, Group therapy, Case management,  1 to 1 session with clinician, Psychoeducation, Recreational therapy.   Physician Treatment Plan for Primary Diagnosis: Substance induced mood disorder (HCC) Long Term Goal(s): Improvement in symptoms so as ready for discharge  Short Term Goals: Ability to identify changes in lifestyle to reduce recurrence of condition will improve Ability to disclose and discuss suicidal ideas Ability to demonstrate self-control will improve Compliance with prescribed medications will improve Ability to identify changes in lifestyle to reduce recurrence of condition will improve Ability to verbalize feelings will improve Ability to disclose and discuss suicidal ideas Compliance with  prescribed medications will improve Ability to identify triggers associated with substance abuse/mental health issues will improve  Medication Management: Evaluate patient's response, side effects, and tolerance of medication regimen.  Therapeutic Interventions: 1 to 1 sessions, Unit Group sessions and Medication administration.  Evaluation of Outcomes: Progressing  Physician Treatment Plan for Secondary Diagnosis: Principal Problem:   Substance induced mood disorder (HCC)   Long Term Goal(s): Improvement in symptoms so as ready for discharge  Short Term Goals: Ability to identify changes in lifestyle to reduce recurrence of condition will improve Ability to disclose and  discuss suicidal ideas Ability to demonstrate self-control will improve Compliance with prescribed medications will improve Ability to identify changes in lifestyle to reduce recurrence of condition will improve Ability to verbalize feelings will improve Ability to disclose and discuss suicidal ideas Compliance with prescribed medications will improve Ability to identify triggers associated with substance abuse/mental health issues will improve  Medication Management: Evaluate patient's response, side effects, and tolerance of medication regimen.  Therapeutic Interventions: 1 to 1 sessions, Unit Group sessions and Medication administration.  Evaluation of Outcomes: Progressing   RN Treatment Plan for Primary Diagnosis: Substance induced mood disorder (HCC) Long Term Goal(s): Knowledge of disease and therapeutic regimen to maintain health will improve  Short Term Goals: Ability to identify and develop effective coping behaviors will improve and Compliance with prescribed medications will improve  Medication Management: RN will administer medications as ordered by provider, will assess and evaluate patient's response and provide education to patient for prescribed medication. RN will report any adverse and/or side effects to prescribing provider.  Therapeutic Interventions: 1 on 1 counseling sessions, Psychoeducation, Medication administration, Evaluate responses to treatment, Monitor vital signs and CBGs as ordered, Perform/monitor CIWA, COWS, AIMS and Fall Risk screenings as ordered, Perform wound care treatments as ordered.  Evaluation of Outcomes: Progressing   LCSW Treatment Plan for Primary Diagnosis: Substance induced mood disorder (HCC) Long Term Goal(s): Safe transition to appropriate next level of care at discharge, Engage patient in therapeutic group addressing interpersonal concerns.  Short Term Goals: Engage patient in aftercare planning with referrals and  resources  Therapeutic Interventions: Assess for all discharge needs, 1 to 1 time with Social worker, Explore available resources and support systems, Assess for adequacy in community support network, Educate family and significant other(s) on suicide prevention, Complete Psychosocial Assessment, Interpersonal group therapy.  Evaluation of Outcomes: Progressing  Pt signed release for [facility], stated she was willing to try it after mother told her she could not return home unless she went to rehab.   Progress in Treatment: Attending groups: Yes Participating in groups: Yes Taking medication as prescribed: Yes Toleration medication: Yes, no side effects reported at this time Family/Significant other contact made: yes Patient understands diagnosis: No  In denial about extent of substance use-only willing to consider rehab when mother threatened to withdraw support Discussing patient identified problems/goals with staff: Yes Medical problems stabilized or resolved: Yes Denies suicidal/homicidal ideation: Yes Issues/concerns per patient self-inventory: None Other: N/A  New problem(s) identified: None identified at this time.   New Short Term/Long Term Goal(s): 'I will go to a 21 day program, but only if I can see my baby before he is adopted out, and only if you will let me come home after the 21 days"  Discharge Plan or Barriers:   Reason for Continuation of Hospitalization: Depression  Medication stabilization Withdrawal symptoms  Estimated Length of Stay: 11/2  Attendees: Patient: Susan [name] 06/06/2017  8:37 AM  Physician:  Jolyne Loa, MD 06/06/2017  8:37 AM  Nursing: Roddie Mc, RN 06/06/2017  8:37 AM  RN Care Manager: Onnie Boer, RN 06/06/2017  8:37 AM  Social Worker: Richelle Ito 06/06/2017  8:37 AM  Recreational Therapist: Aggie Cosier 06/06/2017  8:37 AM  Other: Tomasita Morrow 06/06/2017  8:37 AM  Other:  06/06/2017  8:37 AM    Scribe for Treatment  Team:  Daryel Gerald LCSW 06/06/2017 8:37 AM

## 2017-06-06 NOTE — Progress Notes (Signed)
DAR Note Pt observed isolated to her room. Pt at the time of assessment endorsed moderate anxiety and depression. Pt denies AVH at this time however; verbalizes have some command hallucination during the day. Pt contracts for safety. Pt was med compliant. All patient's questions and concerns addressed. Support, encouragement, and safe environment provided. Did not attend wrap-up group.

## 2017-06-06 NOTE — Progress Notes (Signed)
[facility] MD Progress Note  06/06/2017 12:39 PM Susan [name]  MRN:  [id number] Subjective:    Susan [name] is a 41 y/o F with history of MDD and multiple substance use disorders who presented with worsening depression and illicit substance use of cocaine. Today she continues to endorse CAH which command her to "hurt myself and hurt others." She also endorses VH of seeing "different stuff like little people coming out at me." She endorses SI without specific plan today and she denies VH. Pt feels preoccupied with concern that she will not be able to see her son, and she is tearful with concern that he would be put up for adoption. Pt shares that she was struggling with using about "Two 40 ounces per day" of beer and about $40/day worth of crack cocaine prior to coming to the hospital. She was started on withdrawal protocol, and she notes feeling minor tremors today in her hands. Pt notes she is tolerating her medications without difficulty. She asked if prozac dose could be increased, as she was still experiencing depression symptoms and we agreed to increase the dose. Discussed with patient about option of discharging to treatment center to address her substance use and pt was open to referral to [facility] in [place]. She had no further questions, comments, or concerns.    Principal Problem: Substance induced mood disorder (HCC) Diagnosis:   Patient Active Problem List   Diagnosis Date Noted  . Substance induced mood disorder (HCC) [F19.94] 06/04/2017  . Preterm labor [O60.00] 04/10/2017  . Asymptomatic bacteriuria during pregnancy in second trimester [O99.89, R82.71] 03/14/2017  . Late prenatal care in second trimester [O09.32] 03/08/2017  . Supervision of normal pregnancy [Z34.90] 03/08/2017  . MDD (major depressive disorder), single episode, severe with psychotic features (HCC) [F32.3] 01/05/2017  . Alcohol use disorder, severe, dependence (HCC) [F10.20] 01/05/2017  . Cocaine use disorder, moderate,  dependence (HCC) [F14.20] 01/05/2017  . Cannabis use disorder, moderate, dependence (HCC) [F12.20] 01/05/2017  . Heavy smoker [F17.200] 02/03/2016  . Cocaine abuse affecting pregnancy, antepartum (HCC) [W09.811, F14.10] 02/03/2016  . Chronic, continuous use of opioids [F11.90] 03/24/2015  . Depression with anxiety [F41.8] 03/24/2015  . Poor dentition [K08.9] 03/24/2015  . Sprain of ankle, unspecified site [S93.409A] 05/28/2009   Total Time spent with patient: 30 minutes  Past Psychiatric History: see H&P  Past Medical History:  Past Medical History:  Diagnosis Date  . Alcohol abuse   . Asthma   . BV (bacterial vaginosis) 03/06/2013  . Chronic dental pain   . Chronic pelvic pain in female   . Depression   . Ectopic pregnancy   . Panic attacks   . Polysubstance abuse (HCC)    cocaine, opiates, marijuana  . Trichomonas vaginitis     Past Surgical History:  Procedure Laterality Date  . ECTOPIC PREGNANCY SURGERY     Family History:  Family History  Problem Relation Age of Onset  . COPD Mother   . Stroke Mother   . Heart disease Maternal Grandmother   . Hypertension Maternal Grandmother   . Stroke Maternal Grandfather   . Asthma Sister   . Down syndrome Sister    Family Psychiatric  History: see H&P Social History:  History  Alcohol Use  . Yes    Comment: last use 3 days ago,      History  Drug Use  . Types: Marijuana, Cocaine    Comment: last week    Social History   Social History  . Marital  status: Single    Spouse name: N/A  . Number of children: N/A  . Years of education: N/A   Social History Main Topics  . Smoking status: Current Every Day Smoker    Packs/day: 1.00    Types: Cigarettes  . Smokeless tobacco: Never Used  . Alcohol use Yes     Comment: last use 3 days ago,   . Drug use: Yes    Types: Marijuana, Cocaine     Comment: last week  . Sexual activity: Yes    Birth control/ protection: None   Other Topics Concern  . None   Social  History Narrative  . None   Additional Social History:                         Sleep: Good  Appetite:  Good  Current Medications: Current Facility-Administered Medications  Medication Dose Route Frequency Provider Last Rate Last Dose  . acetaminophen (TYLENOL) tablet 650 mg  650 mg Oral Q6H PRN Kerry Hough, PA-C      . alum & mag hydroxide-simeth (MAALOX/MYLANTA) 200-200-20 MG/5ML suspension 30 mL  30 mL Oral Q4H PRN Kerry Hough, PA-C      . [START ON 06/07/2017] FLUoxetine (PROZAC) capsule 20 mg  20 mg Oral Daily Micheal Likens, MD      . hydrOXYzine (ATARAX/VISTARIL) tablet 25 mg  25 mg Oral Q6H PRN Donell Sievert E, PA-C   25 mg at 06/05/17 2133  . loperamide (IMODIUM) capsule 2-4 mg  2-4 mg Oral PRN Kerry Hough, PA-C      . LORazepam (ATIVAN) tablet 1 mg  1 mg Oral Q6H PRN Kerry Hough, PA-C      . LORazepam (ATIVAN) tablet 1 mg  1 mg Oral BID Kerry Hough, PA-C   1 mg at 06/06/17 0813   Followed by  . [START ON 06/07/2017] LORazepam (ATIVAN) tablet 1 mg  1 mg Oral Daily Simon, Spencer E, PA-C      . magnesium hydroxide (MILK OF MAGNESIA) suspension 30 mL  30 mL Oral Daily PRN Kerry Hough, PA-C      . multivitamin with minerals tablet 1 tablet  1 tablet Oral Daily Kerry Hough, PA-C   1 tablet at 06/06/17 1610  . nicotine (NICODERM CQ - dosed in mg/24 hours) patch 21 mg  21 mg Transdermal Daily Oneta Rack, NP   21 mg at 06/06/17 0800  . OLANZapine (ZYPREXA) tablet 5 mg  5 mg Oral QHS Oneta Rack, NP   5 mg at 06/05/17 2134  . ondansetron (ZOFRAN-ODT) disintegrating tablet 4 mg  4 mg Oral Q6H PRN Kerry Hough, PA-C   4 mg at 06/04/17 1819  . thiamine (VITAMIN B-1) tablet 100 mg  100 mg Oral Daily Donell Sievert E, PA-C   100 mg at 06/06/17 0813  . traZODone (DESYREL) tablet 50 mg  50 mg Oral QHS,MR X 1 Kerry Hough, PA-C   50 mg at 06/05/17 2133    Lab Results:  Results for orders placed or performed during the  hospital encounter of 06/04/17 (from the past 48 hour(s))  Potassium     Status: None   Collection Time: 06/05/17  6:29 AM  Result Value Ref Range   Potassium 3.8 3.5 - 5.1 mmol/L    Comment: Performed at [facility], [address] [place], [place] [address]  Prolactin     Status:  Abnormal   Collection Time: 06/05/17  6:29 AM  Result Value Ref Range   Prolactin 146.6 (H) 4.8 - 23.3 ng/mL    Comment: (NOTE) Performed At: [facility] [address] [place], [place] [id number] Mila HomerHancock William F MD WU:[id number]:[phone] Performed at [facility], [address] [place], [place] [address]   TSH     Status: None   Collection Time: 06/05/17  6:29 AM  Result Value Ref Range   TSH 2.840 0.350 - 4.500 uIU/mL    Comment: Performed by a 3rd Generation assay with a functional sensitivity of <=0.01 uIU/mL. Performed at [facility], [address] [place], [place] [address]   Lipid panel     Status: Abnormal   Collection Time: 06/05/17  6:29 AM  Result Value Ref Range   Cholesterol 119 0 - 200 mg/dL   Triglycerides 784156 (H) <150 mg/dL   HDL 65 >69>40 mg/dL   Total CHOL/HDL Ratio 1.8 RATIO   VLDL 31 0 - 40 mg/dL   LDL Cholesterol 23 0 - 99 mg/dL    Comment:        Total Cholesterol/HDL:CHD Risk Coronary Heart Disease Risk Table                     Men   Women  1/2 Average Risk   3.4   3.3  Average Risk       5.0   4.4  2 X Average Risk   9.6   7.1  3 X Average Risk  23.4   11.0        Use the calculated Patient Ratio above and the CHD Risk Table to determine the patient's CHD Risk.        ATP III CLASSIFICATION (LDL):  <100     mg/dL   Optimal  [id number]  mg/dL   Near or Above                    Optimal  130-159  mg/dL   Borderline  [id number]  mg/dL   High  >010>190     mg/dL   Very High Performed at [facility], [address] [place], [place] [address]     Blood Alcohol level:  Lab Results  Component Value Date    ETH 157 (H) 06/04/2017   ETH <5 04/10/2017    Metabolic Disorder Labs: Lab Results  Component Value Date   HGBA1C 5.3 01/06/2017   MPG 105 01/06/2017   Lab Results  Component Value Date   PROLACTIN 146.6 (H) 06/05/2017   PROLACTIN 54.3 (H) 01/06/2017   Lab Results  Component Value Date   CHOL 119 06/05/2017   TRIG 156 (H) 06/05/2017   HDL 65 06/05/2017   CHOLHDL 1.8 06/05/2017   VLDL 31 06/05/2017   LDLCALC 23 06/05/2017   LDLCALC 23 01/06/2017    Physical Findings: AIMS: Facial and Oral Movements Muscles of Facial Expression: None, normal Lips and Perioral Area: None, normal Jaw: None, normal Tongue: None, normal,Extremity Movements Upper (arms, wrists, hands, fingers): None, normal Lower (legs, knees, ankles, toes): None, normal, Trunk Movements Neck, shoulders, hips: None, normal, Overall Severity Severity of abnormal movements (highest score from questions above): None, normal Incapacitation due to abnormal movements: None, normal Patient's awareness of abnormal movements (rate only patient's report): No Awareness, Dental Status Current problems with teeth and/or dentures?: No Does patient usually wear dentures?: No  CIWA:  CIWA-Ar Total: 5 COWS:  COWS Total Score: 0  Musculoskeletal: Strength & Muscle Tone: within normal limits Gait & Station: normal Patient leans: N/A  Psychiatric Specialty Exam: Physical Exam  Nursing note and vitals reviewed.   Review of Systems  Constitutional: Negative for chills and fever.  Respiratory: Negative for cough and shortness of breath.   Cardiovascular: Negative for chest pain.  Gastrointestinal: Negative for heartburn, nausea and vomiting.  Genitourinary:       Pt reports she has malodorous vaginal discharge for past several days that she has not told other providers in ED about  Neurological: Positive for tremors.    Blood pressure 106/68, pulse 81, temperature 98.5 F (36.9 C), temperature source Oral, resp. rate  18, height 5\' 1"  (1.549 m), weight 62.1 kg (137 lb), last menstrual period 05/29/2017, SpO2 99 %, unknown if currently breastfeeding.Body mass index is 25.89 kg/m.  General Appearance: Casual  Eye Contact:  Good  Speech:  Clear and Coherent and Normal Rate  Volume:  Normal  Mood:  Anxious and Depressed  Affect:  Congruent and Labile  Thought Process:  Goal Directed and Linear  Orientation:  Full (Time, Place, and Person)  Thought Content:  Hallucinations: Auditory Visual  Suicidal Thoughts:  Yes.  without intent/plan  Homicidal Thoughts:  No  Memory:  Immediate;   Good Recent;   Good Remote;   Good  Judgement:  Fair  Insight:  Lacking  Psychomotor Activity:  Normal  Concentration:  Concentration: Good  Recall:  Good  Fund of Knowledge:  Fair  Language:  Fair  Akathisia:  No  Handed:    AIMS (if indicated):     Assets:  Desire for Improvement Resilience  ADL's:  Intact  Cognition:  WNL  Sleep:  Number of Hours: 6.25     Treatment Plan Summary: Daily contact with patient to assess and evaluate symptoms and progress in treatment and Medication management. Pt continues to endorse SI without plan, AH, and VH. She requests to increase dose of prozac. She is open to referral to [facility] for substance use treatment. - Continue inpatient hospitalization for stabilization - Major Depressive disorder with psychotic features  - Change prozac 10mg  qDay to Prozac 20mg  qDay  - Continue zyprexa 5mg  qhs - Alcohol withdrawal  - Continue CIWA protocol with Ativan - Encourage participation in groups and therapeutic milieu - Discharge planning will be ongoing  Micheal Likens, MD 06/06/2017, 12:39 PM

## 2017-06-06 NOTE — Plan of Care (Signed)
Problem: Medication: Goal: Compliance with prescribed medication regimen will improve Outcome: Progressing Patient is compliant with prescribed medication.

## 2017-06-06 NOTE — Progress Notes (Signed)
Adult Psychoeducational Group Note  Date:  06/06/2017 Time:  1:36 AM  Group Topic/Focus:  Wrap-Up Group:   The focus of this group is to help patients review their daily goal of treatment and discuss progress on daily workbooks.  Participation Level:  Did Not Attend  Participation Quality:  Did not attend  Affect:  Did not attend  Cognitive:  Did not attend  Insight: None  Engagement in Group:  Did not attend  Modes of Intervention:  Did not attend  Additional Comments:Pt did not attend the evening wrap up group.  Susan FurnaceChristopher  Susan [name] 06/06/2017, 1:36 AM

## 2017-06-06 NOTE — Progress Notes (Signed)
Recreation Therapy Notes  Date: 06/06/17 Time: 1000 Location: 500 Hall Dayroom  Group Topic: Coping Skills  Goal Area(s) Addresses:  Patient will be able to identify positive coping skills. Patient will be able to identify benefits of using coping skills post d/c.  Behavioral Response: Engaged  Intervention: Worksheet  Activity: Mind map.  Patients were given a worksheet of a blank mind map.  LRT and patients filled in the first eight boxes together with family, depression, anxiety, lack of finances, anger, abandonment issues, stress and housing.   Education: PharmacologistCoping Skills, Building control surveyorDischarge Planning.   Education Outcome: Acknowledges understanding/In group clarification offered/Needs additional education.   Clinical Observations/Feedback: Pt identified some of her coping skills at communication when dealing with family; read and cook for depression; talk to someone for anxiety; play ball for anger; walk and swimming for stress; and be strong for abandonment.  Pt was flat but on task.    Caroll RancherMarjette Alexandru Moorer, LRT/CTRS        Lillia AbedLindsay, Devlyn Retter A 06/06/2017 11:45 AM

## 2017-06-06 NOTE — Plan of Care (Signed)
Problem: Safety: Goal: Periods of time without injury will increase Outcome: Progressing Pt safe on the unit at this time  Problem: Coping: Goal: Ability to cope will improve Outcome: Progressing Pt passive SI- contracts for safety

## 2017-06-06 NOTE — Progress Notes (Signed)
D: Pt passive SI/ AVH- command- pt contracts for safety. Pt is pleasant and cooperative. Pt endorsed AVH, but pt seen interacting in dayroom with peers and does not appear to be responding at this time. Pt stated she was depressed today, but pt has had an upbeat mood on the unit unless approached and her mood turns to a depressed look.   A: Pt was offered support and encouragement. Pt was given scheduled medications. Pt was encourage to attend groups. Q 15 minute checks were done for safety.   R:Pt attends groups and interacts well with peers and staff. Pt is taking medication. Pt receptive to treatment and safety maintained on unit.

## 2017-06-06 NOTE — Progress Notes (Signed)
Adult Psychoeducational Group Note  Date:  06/06/2017 Time:  9:39 PM  Group Topic/Focus:  Self Care:   The focus of this group is to help patients understand the importance of self-care in order to improve or restore emotional, physical, spiritual, interpersonal, and financial health. Wrap-Up Group:   The focus of this group is to help patients review their daily goal of treatment and discuss progress on daily workbooks.  Participation Level:  Minimal  Participation Quality:  Appropriate  Affect:  Appropriate  Cognitive:  Appropriate  Insight: Improving  Engagement in Group:  Engaged  Modes of Intervention:  Socialization and Support  Additional Comments:  Patient attended and participated in group tonight. She reports being depress all day. Today she spoke with her doctor. She went outside, went for her meals ans groups.  Lita MainsFrancis, Bion Todorov [facility] 06/06/2017, 9:39 PM

## 2017-06-07 DIAGNOSIS — F4310 Post-traumatic stress disorder, unspecified: Secondary | ICD-10-CM

## 2017-06-07 MED ORDER — HYDROXYZINE HCL 50 MG PO TABS
50.0000 mg | ORAL_TABLET | Freq: Four times a day (QID) | ORAL | Status: DC | PRN
Start: 2017-06-07 — End: 2017-06-08
  Administered 2017-06-07 (×2): 50 mg via ORAL
  Filled 2017-06-07 (×2): qty 1

## 2017-06-07 MED ORDER — TRAZODONE HCL 100 MG PO TABS
100.0000 mg | ORAL_TABLET | Freq: Every evening | ORAL | Status: DC | PRN
  Administered 2017-06-07: 100 mg via ORAL
  Filled 2017-06-07 (×4): qty 1

## 2017-06-07 NOTE — Plan of Care (Signed)
Problem: Safety: Goal: Periods of time without injury will increase Outcome: Progressing Pt safe on the unit at this time  Problem: Coping: Goal: Ability to cope will improve Outcome: Progressing Pt denies SI at this time

## 2017-06-07 NOTE — Progress Notes (Signed)
Adult Psychoeducational Group Note  Date:  06/07/2017 Time:  8:33 PM  Group Topic/Focus:  Wrap-Up Group:   The focus of this group is to help patients review their daily goal of treatment and discuss progress on daily workbooks.  Participation Level:  Active  Participation Quality:  Appropriate  Affect:  Appropriate  Cognitive:  Appropriate  Insight: Appropriate  Engagement in Group:  Engaged  Modes of Intervention:  Discussion  Additional Comments:  The patient expressed that she attended the medication education group.The patient also said that she rates her day a 7.  Octavio Mannshigpen, Trudee Chirino Lee 06/07/2017, 8:33 PM

## 2017-06-07 NOTE — Progress Notes (Signed)
[facility]  06/07/2017 12:59 PM Susan [name]  MRN:  [id number] Subjective:    Susan [name] is a 41 y/o F with history of MDD, PTSD, and multiple substance use disorders who presented with worsening mood symptoms, AH, and SI with plan to cut her wrist in the context of worsening use of cocaine. Pt reports that her mood is mildly improved today, but she is having worsened anxiety today. She explains, "I'm having real bad anxiety - my mood is ok - but my anxiety is real bad today; I could hardly sleep last night." Pt endorses SI without a specific plan today. She denies HI. She endorses AH of voices that command her to "hurt myself and others." She endorses VH of seeing "people coming at me." She endorses PTSD symptoms of nightmares, flashbacks, and hypervigilance. We agreed to increase dose of trazodone to help with initial insomnia. We also discussed utilizing PRN atarax to address anxiety symptoms and pt was in agreement. Pt expresses that she is motivated to seek treatment for substance use and she has been working closely with SW team to look into available options for discharge. She had no further questions, comments, or concerns.   Principal Problem: Substance induced mood disorder (HCC) Diagnosis:   Patient Active Problem List   Diagnosis Date Noted  . PTSD (post-traumatic stress disorder) [F43.10] 06/07/2017  . Substance induced mood disorder (HCC) [F19.94] 06/04/2017  . Preterm labor [O60.00] 04/10/2017  . Asymptomatic bacteriuria during pregnancy in second trimester [O99.89, R82.71] 03/14/2017  . Late prenatal care in second trimester [O09.32] 03/08/2017  . Supervision of normal pregnancy [Z34.90] 03/08/2017  . MDD (major depressive disorder), single episode, severe with psychotic features (HCC) [F32.3] 01/05/2017  . Alcohol use disorder, severe, dependence (HCC) [F10.20] 01/05/2017  . Cocaine use disorder, moderate, dependence (HCC) [F14.20] 01/05/2017  . Cannabis use  disorder, moderate, dependence (HCC) [F12.20] 01/05/2017  . Heavy smoker [F17.200] 02/03/2016  . Cocaine abuse affecting pregnancy, antepartum (HCC) [Q65.784[O99.320, F14.10] 02/03/2016  . Chronic, continuous use of opioids [F11.90] 03/24/2015  . Depression with anxiety [F41.8] 03/24/2015  . Poor dentition [K08.9] 03/24/2015  . Sprain of ankle, unspecified site [S93.409A] 05/28/2009   Total Time spent with patient: 30 minutes  Past Psychiatric History: see H&P  Past Medical History:  Past Medical History:  Diagnosis Date  . Alcohol abuse   . Asthma   . BV (bacterial vaginosis) 03/06/2013  . Chronic dental pain   . Chronic pelvic pain in female   . Depression   . Ectopic pregnancy   . Panic attacks   . Polysubstance abuse (HCC)    cocaine, opiates, marijuana  . Trichomonas vaginitis     Past Surgical History:  Procedure Laterality Date  . ECTOPIC PREGNANCY SURGERY     Family History:  Family History  Problem Relation Age of Onset  . COPD Mother   . Stroke Mother   . Heart disease Maternal Grandmother   . Hypertension Maternal Grandmother   . Stroke Maternal Grandfather   . Asthma Sister   . Down syndrome Sister    Family Psychiatric  History: see H&P Social History:  History  Alcohol Use  . Yes    Comment: last use 3 days ago,      History  Drug Use  . Types: Marijuana, Cocaine    Comment: last week    Social History   Social History  . Marital status: Single    Spouse name: N/A  . Number of  children: N/A  . Years of education: N/A   Social History Main Topics  . Smoking status: Current Every Day Smoker    Packs/day: 1.00    Types: Cigarettes  . Smokeless tobacco: Never Used  . Alcohol use Yes     Comment: last use 3 days ago,   . Drug use: Yes    Types: Marijuana, Cocaine     Comment: last week  . Sexual activity: Yes    Birth control/ protection: None   Other Topics Concern  . None   Social History Narrative  . None   Additional Social History:                          Sleep: Fair  Appetite:  Good  Current Medications: Current Facility-Administered Medications  Medication Dose Route Frequency Provider Last Rate Last Dose  . acetaminophen (TYLENOL) tablet 650 mg  650 mg Oral Q6H PRN Kerry Hough, PA-C   650 mg at 06/07/17 5284  . alum & mag hydroxide-simeth (MAALOX/MYLANTA) 200-200-20 MG/5ML suspension 30 mL  30 mL Oral Q4H PRN Donell Sievert E, PA-C      . clotrimazole (GYNE-LOTRIMIN) vaginal cream 1 Applicatorful  1 Applicatorful Vaginal QHS Armandina Stammer I, NP   1 Applicatorful at 06/06/17 2121  . FLUoxetine (PROZAC) capsule 20 mg  20 mg Oral Daily Micheal Likens, MD   20 mg at 06/07/17 0725  . hydrOXYzine (ATARAX/VISTARIL) tablet 50 mg  50 mg Oral Q6H PRN Micheal Likens, MD      . magnesium hydroxide (MILK OF MAGNESIA) suspension 30 mL  30 mL Oral Daily PRN Kerry Hough, PA-C      . multivitamin with minerals tablet 1 tablet  1 tablet Oral Daily Kerry Hough, PA-C   1 tablet at 06/07/17 0726  . nicotine (NICODERM CQ - dosed in mg/24 hours) patch 21 mg  21 mg Transdermal Daily Oneta Rack, NP   21 mg at 06/07/17 0726  . OLANZapine (ZYPREXA) tablet 5 mg  5 mg Oral QHS Oneta Rack, NP   5 mg at 06/06/17 2120  . thiamine (VITAMIN B-1) tablet 100 mg  100 mg Oral Daily Donell Sievert E, PA-C   100 mg at 06/07/17 0726  . traZODone (DESYREL) tablet 100 mg  100 mg Oral QHS,MR X 1 Amy Belloso T, MD        Lab Results: No results found for this or any previous visit (from the past 48 hour(s)).  Blood Alcohol level:  Lab Results  Component Value Date   ETH 157 (H) 06/04/2017   ETH <5 04/10/2017    Metabolic Disorder Labs: Lab Results  Component Value Date   HGBA1C 5.3 01/06/2017   MPG 105 01/06/2017   Lab Results  Component Value Date   PROLACTIN 146.6 (H) 06/05/2017   PROLACTIN 54.3 (H) 01/06/2017   Lab Results  Component Value Date   CHOL 119 06/05/2017   TRIG  156 (H) 06/05/2017   HDL 65 06/05/2017   CHOLHDL 1.8 06/05/2017   VLDL 31 06/05/2017   LDLCALC 23 06/05/2017   LDLCALC 23 01/06/2017    Physical Findings: AIMS: Facial and Oral Movements Muscles of Facial Expression: None, normal Lips and Perioral Area: None, normal Jaw: None, normal Tongue: None, normal,Extremity Movements Upper (arms, wrists, hands, fingers): None, normal Lower (legs, knees, ankles, toes): None, normal, Trunk Movements Neck, shoulders, hips: None, normal, Overall Severity Severity of abnormal movements (  highest score from questions above): None, normal Incapacitation due to abnormal movements: None, normal Patient's awareness of abnormal movements (rate only patient's report): No Awareness, Dental Status Current problems with teeth and/or dentures?: No Does patient usually wear dentures?: No  CIWA:  CIWA-Ar Total: 5 COWS:  COWS Total Score: 0  Musculoskeletal: Strength & Muscle Tone: within normal limits Gait & Station: normal Patient leans: N/A  Psychiatric Specialty Exam: Physical Exam  Nursing note and vitals reviewed.   Review of Systems  Constitutional: Negative for chills and fever.  Respiratory: Negative for cough and shortness of breath.   Cardiovascular: Negative for chest pain.  Gastrointestinal: Negative for heartburn, nausea and vomiting.  Psychiatric/Behavioral: Positive for depression, hallucinations and suicidal ideas. The patient is nervous/anxious.     Blood pressure (!) 91/58, pulse 93, temperature 98.3 F (36.8 C), temperature source Oral, resp. rate 18, height 5\' 1"  (1.549 m), weight 62.1 kg (137 lb), last menstrual period 05/29/2017, SpO2 99 %, unknown if currently breastfeeding.Body mass index is 25.89 kg/m.  General Appearance: Casual  Eye Contact:  Fair  Speech:  Clear and Coherent and Normal Rate  Volume:  Normal  Mood:  Anxious and Depressed  Affect:  Congruent and Constricted  Thought Process:  Coherent, Goal Directed and  Descriptions of Associations: Loose  Orientation:  Full (Time, Place, and Person)  Thought Content:  Hallucinations: Auditory Command:  to hurt self and others Visual  Suicidal Thoughts:  Yes.  without intent/plan  Homicidal Thoughts:  No  Memory:  Immediate;   Good Recent;   Good Remote;   Good  Judgement:  Fair  Insight:  Fair  Psychomotor Activity:  Normal  Concentration:  Concentration: Good  Recall:  Good  Fund of Knowledge:  Good  Language:  Good  Akathisia:  No  Handed:    AIMS (if indicated):     Assets:  Manufacturing systems engineer Physical Health Resilience Social Support  ADL's:  Intact  Cognition:  WNL  Sleep:  Number of Hours: 5     Treatment Plan Summary: Daily contact with patient to assess and evaluate symptoms and progress in treatment and Medication management. Pt reports mild improvement of her mood and psychotic symptoms. She is motivated to seek treatment for substance use.  -Continue inpatient hospitalization -MDD with psychotic features vs PTSD  - Continue prozac 20mg  qDay  - Continue olanzapine 5mg  qhs -Anxiety  -Continue atarax 50mg  q6h prn anxiety -Insomnia  - Change trazodone 50mg  qhs to trazodone 100mg  qhs - Encourage participation in groups and therapeutic milieu -Discharge planning will be ongoing   Micheal Likens, MD 06/07/2017, 12:59 PM

## 2017-06-07 NOTE — Progress Notes (Signed)
D: Pt denies SI/HI/AVH. Pt is pleasant and cooperative. Pt only focus is on D/C . Pt state she was told she would leave tomorrow morning. Pt visible on the unit   A: Pt was offered support and encouragement. Pt was given scheduled medications. Pt was encourage to attend groups. Q 15 minute checks were done for safety.   R:Pt attends groups and interacts well with peers and staff. Pt is taking medication. Pt has no complaints at this time.Pt receptive to treatment and safety maintained on unit.

## 2017-06-07 NOTE — Progress Notes (Signed)
Recreation Therapy Notes  Date: 06/07/17 Time: 1000 Location: 500 Hall Dayroom  Group Topic: Self-Esteem  Goal Area(s) Addresses:  Patient will successfully identify positive attributes about themselves.  Patient will successfully identify benefit of improved self-esteem.   Intervention: Colored pencils, facial masks  Activity: How I See Me.  Patients were given a blank outline of a face.  Patients were to fill in the face with pictures or words that describe how they see themselves.  Education:  Self-Esteem, Building control surveyorDischarge Planning.   Education Outcome: Acknowledges education/In group clarification offered/Needs additional education  Clinical Observations/Feedback: Pt did not attend group.   Caroll RancherMarjette Hanson Medeiros, LRT/CTRS         Caroll RancherLindsay, Lesia Monica A 06/07/2017 11:31 AM

## 2017-06-07 NOTE — [facility] Group Notes (Signed)
LCSW Group Therapy Note   06/07/2017 1:15pm   Type of Therapy and Topic:  Group Therapy:  Positive Affirmations   Participation Level:  Minimal  Description of Group: This group addressed positive affirmation toward self and others. Patients went around the room and identified two positive things about themselves and two positive things about a peer in the room. Patients reflected on how it felt to share something positive with others, to identify positive things about themselves, and to hear positive things from others. Patients were encouraged to have a daily reflection of positive characteristics or circumstances.  Therapeutic Goals 1. Patient will verbalize two of their positive qualities 2. Patient will demonstrate empathy for others by stating two positive qualities about a peer in the group 3. Patient will verbalize their feelings when voicing positive self affirmations and when voicing positive affirmations of others 4. Patients will discuss the potential positive impact on their wellness/recovery of focusing on positive traits of self and others. Summary of Patient Progress:  Stayed the entire time, engaged throughout.  Stated that she has had 3 years of sobriety in the past, "and if I could do it then, I can do it again."  As with that time, she is planning on using rehab as a springboard to help her get a fresh start and stay focused.   Therapeutic Modalities Cognitive Behavioral Therapy Motivational Interviewing  Ida RogueRodney B Coulton Schlink, [place] 06/07/2017 3:47 PM

## 2017-06-07 NOTE — Progress Notes (Signed)
Patient has been isolative the shift, with a complaint of increased anxiety.  Patient is fearful of her CIWA protocol ending and her ativan being decreased, but still experiencing the symptoms of withdrawal.   Assess patient for safety, offer medications as prescribed, engage patient in 1:1 staff talks.   Patient able to contract for safety.  Continue to monitor as prescribed.

## 2017-06-08 MED ORDER — OLANZAPINE 5 MG PO TABS: 5.0000 mg | ORAL_TABLET | Freq: Every day | ORAL | Status: DC

## 2017-06-08 MED ORDER — CLOTRIMAZOLE 1 % VA CREA: 1.0000 | TOPICAL_CREAM | Freq: Every day | VAGINAL | 0 refills | Status: DC

## 2017-06-08 MED ORDER — FLUOXETINE HCL 20 MG PO CAPS: 20.0000 mg | ORAL_CAPSULE | Freq: Every day | ORAL | 0 refills | Status: DC

## 2017-06-08 MED ORDER — HYDROXYZINE HCL 50 MG PO TABS: 50.0000 mg | ORAL_TABLET | Freq: Four times a day (QID) | ORAL | 0 refills | Status: DC | PRN

## 2017-06-08 MED ORDER — TRAZODONE HCL 100 MG PO TABS: 100.0000 mg | ORAL_TABLET | Freq: Every evening | ORAL | Status: DC | PRN

## 2017-06-08 NOTE — Progress Notes (Signed)
  [facility] :  Will you be returning to the same living situation after discharge:  No. At discharge, do you have transportation home?: Yes,  peer support worker will transprt to [facility] Do you have the ability to pay for your medications: Yes,  MCD  Release of information consent forms completed and in the chart;  Patient's signature needed at discharge.  Patient to Follow up at: Follow-up Information    [facility] Follow up on 06/08/2017.   Why:  Wednesday at 10:30 at [facility] Contact information: [address] [place] [address] [phone]           Next level of care provider [facility] Link:no  Safety Planning and Suicide Prevention discussed: Yes,  yes     Has patient been referred to the Quitline?: Patient refused referral  Patient has been referred for addiction treatment: Yes  Ida RogueRodney B Shail Urbas, LCSW 06/08/2017, 8:29 AM

## 2017-06-08 NOTE — [facility] Suicide Risk Assessment (Addendum)
[facility]   Principal Problem: PTSD (post-traumatic stress disorder) Discharge Diagnoses:  Patient Active Problem List   Diagnosis Date Noted  . PTSD (post-traumatic stress disorder) [F43.10] 06/07/2017  . Substance induced mood disorder (HCC) [F19.94] 06/04/2017  . Preterm labor [O60.00] 04/10/2017  . Asymptomatic bacteriuria during pregnancy in second trimester [O99.89, R82.71] 03/14/2017  . Late prenatal care in second trimester [O09.32] 03/08/2017  . Supervision of normal pregnancy [Z34.90] 03/08/2017  . MDD (major depressive disorder), single episode, severe with psychotic features (HCC) [F32.3] 01/05/2017  . Alcohol use disorder, severe, dependence (HCC) [F10.20] 01/05/2017  . Cocaine use disorder, moderate, dependence (HCC) [F14.20] 01/05/2017  . Cannabis use disorder, moderate, dependence (HCC) [F12.20] 01/05/2017  . Heavy smoker [F17.200] 02/03/2016  . Cocaine abuse affecting pregnancy, antepartum (HCC) [Z61.096[O99.320, F14.10] 02/03/2016  . Chronic, continuous use of opioids [F11.90] 03/24/2015  . Depression with anxiety [F41.8] 03/24/2015  . Poor dentition [K08.9] 03/24/2015  . Sprain of ankle, unspecified site [S93.409A] 05/28/2009    Total Time spent with patient: 30 minutes  Musculoskeletal: Strength & Muscle Tone: within normal limits Gait & Station: normal Patient leans: N/A  Psychiatric Specialty Exam: Review of Systems  Constitutional: Negative for chills and fever.  Cardiovascular: Negative for chest pain.  Gastrointestinal: Negative for heartburn, nausea and vomiting.  Neurological: Negative for dizziness.  Psychiatric/Behavioral: Positive for hallucinations. Negative for depression and suicidal ideas.    Blood pressure (!) 91/56, pulse 90, temperature 98.7 F (37.1 C), temperature source Oral, resp. rate 18, height 5\' 1"  (1.549 m), weight 62.1 kg (137 lb), last menstrual period 05/29/2017, SpO2 99 %, unknown if currently  breastfeeding.Body mass index is 25.89 kg/m.  General Appearance: Casual  Eye Contact::  Good  Speech:  Clear and Coherent  Volume:  Normal  Mood:  Anxious  Affect:  Appropriate and Congruent  Thought Process:  Coherent and Goal Directed  Orientation:  Full (Time, Place, and Person)  Thought Content:  Logical and Hallucinations: Auditory  Suicidal Thoughts:  No  Homicidal Thoughts:  No  Memory:  Immediate;   Good  Judgement:  Good  Insight:  Fair  Psychomotor Activity:  Normal  Concentration:  Good  Recall:  Good  Fund of Knowledge:Good  Language: Good  Akathisia:  No  Handed:    AIMS (if indicated):     Assets:  Communication Skills Desire for Improvement Financial Resources/Insurance Housing Resilience Social Support  Sleep:  Number of Hours: 6.5  Cognition: WNL  ADL's:  Intact   Mental Status Per Nursing Assessment::   On Admission:     Demographic Factors:  Caucasian, Low socioeconomic status and Unemployed  Loss Factors: NA  Historical Factors: Family history of mental illness or substance abuse  Risk Reduction Factors:   Sense of responsibility to family, Positive social support, Positive therapeutic relationship and Positive coping skills or problem solving skills  Continued Clinical Symptoms:  Severe Anxiety and/or Agitation Depression:   Comorbid alcohol abuse/dependence Previous Psychiatric Diagnoses and Treatments  Cognitive Features That Contribute To Risk:  None    Suicide Risk:  Minimal: No identifiable suicidal ideation.  Patients presenting with no risk factors but with morbid ruminations; may be classified as minimal risk based on the severity of the depressive symptoms  Follow-up Information    [facility] Follow up on 06/08/2017.   Why:  Wednesday at 10:30 at [facility] Contact information: [address] [place] [address] [phone]  Subjective Data:  - Susan [name] is a 41 y/o F  with history of MDD, PTSD, and multiple substance use disorders who initial presented with depression and SI with plan to cut wrist. Pt had also been using increasing amount of crack cocaine. Pt was restarted on psychotropic medications including prozac 20mg  qday and zyprexa 5mg  qhs. With participation in the therapeutic milieu and groups, pt had improvement of mood symptoms. Today pt reports she is doing well overall. She reports that AH are "pretty quiet" and she is able to ignore them. She endorses some VH of seeing "little people" the night before, but pt was able to ignore them. She had some anxiety which developed to mild SI without a plan last evening, but pt was able to resolve them on her own by using her coping skills. This AM, pt denies SI/HI. She is future-oriented about completing substance use treatment at [facility]. She is able to engage in safety planning including plan to return to [facility] or contact emergency services if she feels unsafe. She had no further questions, comments, or concerns.   Plan Of Care/Follow-up recommendations:  -DC to outpatient level of care - MDD  - Continue prozac 20mg  qDay - PTSD  - Continue olanzapine 5mg  qhs - Substance induced mood disorder (cocaine)  - Continue above orders of prozac and olanzapine   Activity:  as tolerated Diet:  normal Tests:  NA Other:  see DC plan above  Micheal Likens, MD 06/08/2017, 9:05 AM
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
## 2017-06-08 NOTE — Progress Notes (Signed)
Patient ID: Susan [name], female   DOB: 06/12/1976, 41 y.o.   MRN: [id number] Patient discharged to [facility] for continued drug rehab and psychiatric care.  Patient denies SI, HI and AVH upon discharge.  Patient acknowledged understanding of all discharge planning and receipt of all belongings.  Patient able to contract for safety and plans for continued care within the community.

## 2017-07-06 ENCOUNTER — Other Ambulatory Visit: Payer: Self-pay | Admitting: Women's Health

## 2017-07-06 ENCOUNTER — Encounter: Payer: Self-pay | Admitting: Obstetrics & Gynecology

## 2017-07-11 ENCOUNTER — Encounter: Payer: Self-pay | Admitting: *Deleted

## 2017-07-11 ENCOUNTER — Ambulatory Visit: Payer: Medicaid Other | Admitting: Obstetrics and Gynecology

## 2017-09-13 DIAGNOSIS — K5900 Constipation, unspecified: Secondary | ICD-10-CM | POA: Insufficient documentation

## 2017-09-13 DIAGNOSIS — F419 Anxiety disorder, unspecified: Secondary | ICD-10-CM | POA: Insufficient documentation

## 2017-12-26 ENCOUNTER — Emergency Department (HOSPITAL_COMMUNITY)
Admission: EM | Admit: 2017-12-26 | Discharge: 2017-12-26 | Disposition: A | Discharge status: Home / Self Care | Payer: Medicaid Other | Attending: Emergency Medicine | Admitting: Emergency Medicine

## 2017-12-26 ENCOUNTER — Other Ambulatory Visit: Payer: Self-pay

## 2017-12-26 ENCOUNTER — Emergency Department (HOSPITAL_COMMUNITY): Payer: Medicaid Other

## 2017-12-26 ENCOUNTER — Encounter (HOSPITAL_COMMUNITY): Payer: Self-pay

## 2017-12-26 DIAGNOSIS — M7732 Calcaneal spur, left foot: Secondary | ICD-10-CM | POA: Insufficient documentation

## 2017-12-26 DIAGNOSIS — J45909 Unspecified asthma, uncomplicated: Secondary | ICD-10-CM | POA: Insufficient documentation

## 2017-12-26 DIAGNOSIS — M79672 Pain in left foot: Secondary | ICD-10-CM

## 2017-12-26 DIAGNOSIS — Z79899 Other long term (current) drug therapy: Secondary | ICD-10-CM | POA: Insufficient documentation

## 2017-12-26 DIAGNOSIS — F17210 Nicotine dependence, cigarettes, uncomplicated: Secondary | ICD-10-CM | POA: Insufficient documentation

## 2017-12-26 MED ORDER — NAPROXEN 500 MG PO TABS: 500.0000 mg | ORAL_TABLET | Freq: Two times a day (BID) | ORAL | 0 refills | Status: DC

## 2017-12-26 MED ORDER — HYDROCODONE-ACETAMINOPHEN 5-325 MG PO TABS
1.0000 | ORAL_TABLET | Freq: Once | ORAL | Status: AC
  Administered 2017-12-26: 1 via ORAL
  Filled 2017-12-26: qty 1

## 2017-12-26 NOTE — ED Notes (Signed)
Pt states understanding of d/c instructions and is arranging for pick up d/t narcotic administration. Pt educated on this topic and states understanding.

## 2017-12-26 NOTE — Discharge Instructions (Addendum)
Try rolling your foot on a cold can or bottle.  Wear a insert to your heel and be sure to wear supportive shoes.  Call the foot doctor listed to arrange a follow-up appointment.

## 2017-12-26 NOTE — ED Triage Notes (Signed)
Patient complains of pain in left heel x1 month. Pain is worse with ambulation. Denies injury. Has not been seen for issue prior.

## 2017-12-29 NOTE — ED Provider Notes (Signed)
[facility] EMERGENCY DEPARTMENT Provider Note   CSN: [id number] Arrival date & time: 12/26/17  4098     History   Chief Complaint Chief Complaint  Patient presents with  . Foot Pain    HPI Susan [name] is a 42 y.o. female.  HPI   Susan [name] is a 42 y.o. female who presents to the Emergency Department complaining of left heel pain for one month.  She describes a throbbing pain to her heel associated with weight bearing.  Pain worse in the morning and improves through the day.  Admits to frequently wearing sandals.  She has not tried any medication or other therapies for symptoms relief.  She denies known injury, swelling, open wounds, fever or redness.  No pain proximal to the foot.    Past Medical History:  Diagnosis Date  . Alcohol abuse   . Asthma   . BV (bacterial vaginosis) 03/06/2013  . Chronic dental pain   . Chronic pelvic pain in female   . Depression   . Ectopic pregnancy   . Panic attacks   . Polysubstance abuse (HCC)    cocaine, opiates, marijuana  . Trichomonas vaginitis     Patient Active Problem List   Diagnosis Date Noted  . PTSD (post-traumatic stress disorder) 06/07/2017  . Substance induced mood disorder (HCC) 06/04/2017  . Preterm labor 04/10/2017  . Asymptomatic bacteriuria during pregnancy in second trimester 03/14/2017  . Late prenatal care in second trimester 03/08/2017  . Supervision of normal pregnancy 03/08/2017  . MDD (major depressive disorder), single episode, severe with psychotic features (HCC) 01/05/2017  . Alcohol use disorder, severe, dependence (HCC) 01/05/2017  . Cocaine use disorder, moderate, dependence (HCC) 01/05/2017  . Cannabis use disorder, moderate, dependence (HCC) 01/05/2017  . Heavy smoker 02/03/2016  . Cocaine abuse affecting pregnancy, antepartum (HCC) 02/03/2016  . Chronic, continuous use of opioids 03/24/2015  . Depression with anxiety 03/24/2015  . Poor dentition 03/24/2015  . Sprain of ankle, unspecified  site 05/28/2009    Past Surgical History:  Procedure Laterality Date  . ECTOPIC PREGNANCY SURGERY       OB History    Gravida  9   Para  5   Term  4   Preterm  1   AB  4   Living  5     SAB  1   TAB  2   Ectopic  1   Multiple  0   Live Births  5            Home Medications    Prior to Admission medications   Medication Sig Start Date End Date Taking? Authorizing Provider  clotrimazole (GYNE-LOTRIMIN) 1 % vaginal cream Place 1 Applicatorful vaginally at bedtime. 06/08/17   Money, Gerlene Burdock, FNP  FLUoxetine (PROZAC) 20 MG capsule Take 1 capsule (20 mg total) by mouth daily. For mood control 06/09/17   Money, Feliz Beam B, FNP  hydrOXYzine (ATARAX/VISTARIL) 50 MG tablet Take 1 tablet (50 mg total) by mouth every 6 (six) hours as needed for anxiety. 06/08/17   Money, Gerlene Burdock, FNP  naproxen (NAPROSYN) 500 MG tablet Take 1 tablet (500 mg total) by mouth 2 (two) times daily with a meal. 12/26/17   Taahir Grisby, PA-C  OLANZapine (ZYPREXA) 5 MG tablet Take 1 tablet (5 mg total) by mouth at bedtime. For mood control 06/08/17   Money, Gerlene Burdock, FNP  traZODone (DESYREL) 100 MG tablet Take 1 tablet (100 mg total) by mouth at bedtime  and may repeat dose one time if needed. 06/08/17   Money, Gerlene Burdock, FNP    Family History Family History  Problem Relation Age of Onset  . COPD Mother   . Stroke Mother   . Heart disease Maternal Grandmother   . Hypertension Maternal Grandmother   . Stroke Maternal Grandfather   . Asthma Sister   . Down syndrome Sister     Social History Social History   Tobacco Use  . Smoking status: Current Every Day Smoker    Packs/day: 1.00    Types: Cigarettes  . Smokeless tobacco: Never Used  Substance Use Topics  . Alcohol use: Yes    Comment: last use 3 days ago,   . Drug use: Yes    Types: Marijuana, Cocaine    Comment: last week     Allergies   Patient has no known allergies.   Review of Systems Review of Systems    Constitutional: Negative for chills and fever.  Musculoskeletal: Positive for arthralgias (left foot pain). Negative for joint swelling.  Skin: Negative for color change and wound.  Neurological: Negative for weakness and numbness.  All other systems reviewed and are negative.    Physical Exam Updated Vital Signs BP 105/61   Pulse 74   Temp 98 F (36.7 C) (Oral)   Resp 18   Ht  (1.549 m)   Wt 63.5 kg (140 lb)   LMP 12/12/2017   SpO2 100%   BMI 26.45 kg/m   Physical Exam  Constitutional: She is oriented to person, place, and time. She appears well-developed and well-nourished. No distress.  HENT:  Head: Normocephalic and atraumatic.  Cardiovascular: Normal rate, regular rhythm and intact distal pulses.  Pulmonary/Chest: Effort normal and breath sounds normal.  Musculoskeletal: She exhibits edema and tenderness. She exhibits no deformity.  Focal ttp of the plantar surface of the left hind foot.  No erythema or edema.  No proximal tenderness.   Neurological: She is alert and oriented to person, place, and time. No sensory deficit. She exhibits normal muscle tone.  Skin: Skin is warm and dry. Capillary refill takes less than 2 seconds. No rash noted. No erythema.  Nursing note and vitals reviewed.    ED Treatments / Results  Labs (all labs ordered are listed, but only abnormal results are displayed) Labs Reviewed - No data to display  EKG None  Radiology Dg Foot Complete Left  Result Date: 12/26/2017 CLINICAL DATA:  Chronic bilateral heel pain without known injury. EXAM: LEFT FOOT - COMPLETE 3+ VIEW COMPARISON:  None. FINDINGS: There is no evidence of fracture or dislocation. There is no evidence of arthropathy. Mild posterior calcaneal spurring is noted. Soft tissues are unremarkable. IMPRESSION: Mild posterior calcaneal spurring. No acute abnormality seen in the left foot. Electronically Signed   By: Lupita Raider, M.D.   On: 12/26/2017 10:22      Procedures Procedures (including critical care time)  Medications Ordered in ED Medications  HYDROcodone-acetaminophen (NORCO/VICODIN) 5-325 MG per tablet 1 tablet (1 tablet Oral Given 12/26/17 1228)     Initial Impression / Assessment and Plan / ED Course  I have reviewed the triage vital signs and the nursing notes.  Pertinent labs & imaging results that were available during my care of the patient were reviewed by me and considered in my medical decision making (see chart for details).     Pt with calcaneal spur per XR.  Likely plantar fasciitis.  No concerning sx's for cellulitis.  NV intact.  Discussed tx plan and podiatry f/u info given.    Final Clinical Impressions(s) / ED Diagnoses   Final diagnoses:  Left foot pain  Calcaneal spur of left foot    ED Discharge Orders        Ordered    naproxen (NAPROSYN) 500 MG tablet  2 times daily with meals     12/26/17 1223       Pauline Aus, PA-C 12/29/17 1757    Rolland Porter, MD 12/31/17 1216

## 2018-06-22 ENCOUNTER — Emergency Department (HOSPITAL_COMMUNITY)
Admission: EM | Admit: 2018-06-22 | Discharge: 2018-06-22 | Disposition: A | Discharge status: Other Acute Inpatient Hospital | Payer: Self-pay | Attending: Emergency Medicine | Admitting: Emergency Medicine

## 2018-06-22 ENCOUNTER — Encounter (HOSPITAL_COMMUNITY): Payer: Self-pay

## 2018-06-22 ENCOUNTER — Other Ambulatory Visit: Payer: Self-pay

## 2018-06-22 DIAGNOSIS — F419 Anxiety disorder, unspecified: Secondary | ICD-10-CM | POA: Insufficient documentation

## 2018-06-22 DIAGNOSIS — Z046 Encounter for general psychiatric examination, requested by authority: Secondary | ICD-10-CM | POA: Insufficient documentation

## 2018-06-22 DIAGNOSIS — F333 Major depressive disorder, recurrent, severe with psychotic symptoms: Secondary | ICD-10-CM | POA: Insufficient documentation

## 2018-06-22 DIAGNOSIS — F1099 Alcohol use, unspecified with unspecified alcohol-induced disorder: Secondary | ICD-10-CM | POA: Insufficient documentation

## 2018-06-22 DIAGNOSIS — R45851 Suicidal ideations: Secondary | ICD-10-CM | POA: Insufficient documentation

## 2018-06-22 DIAGNOSIS — J45909 Unspecified asthma, uncomplicated: Secondary | ICD-10-CM | POA: Insufficient documentation

## 2018-06-22 DIAGNOSIS — Y906 Blood alcohol level of 120-199 mg/100 ml: Secondary | ICD-10-CM | POA: Insufficient documentation

## 2018-06-22 DIAGNOSIS — M549 Dorsalgia, unspecified: Secondary | ICD-10-CM | POA: Insufficient documentation

## 2018-06-22 DIAGNOSIS — Z79899 Other long term (current) drug therapy: Secondary | ICD-10-CM | POA: Insufficient documentation

## 2018-06-22 DIAGNOSIS — R440 Auditory hallucinations: Secondary | ICD-10-CM | POA: Insufficient documentation

## 2018-06-22 DIAGNOSIS — R441 Visual hallucinations: Secondary | ICD-10-CM | POA: Insufficient documentation

## 2018-06-22 DIAGNOSIS — F17210 Nicotine dependence, cigarettes, uncomplicated: Secondary | ICD-10-CM | POA: Insufficient documentation

## 2018-06-22 DIAGNOSIS — F1910 Other psychoactive substance abuse, uncomplicated: Secondary | ICD-10-CM | POA: Insufficient documentation

## 2018-06-22 LAB — ETHANOL: Alcohol, Ethyl (B): 130 mg/dL — ABNORMAL HIGH (ref <10)

## 2018-06-22 LAB — CBC WITH DIFFERENTIAL/PLATELET
Abs Immature Granulocytes: 0.09 10*3/uL — ABNORMAL HIGH (ref 0.00–0.07)
Basophils Absolute: 0 10*3/uL (ref 0.0–0.1)
Basophils Relative: 0 %
Eosinophils Absolute: 0.2 10*3/uL (ref 0.0–0.5)
Eosinophils Relative: 1 %
HCT: 41 % (ref 36.0–46.0)
Hemoglobin: 13.2 g/dL (ref 12.0–15.0)
Immature Granulocytes: 1 %
Lymphocytes Relative: 33 %
Lymphs Abs: 4.1 10*3/uL — ABNORMAL HIGH (ref 0.7–4.0)
MCH: 30.1 pg (ref 26.0–34.0)
MCHC: 32.2 g/dL (ref 30.0–36.0)
MCV: 93.4 fL (ref 80.0–100.0)
Monocytes Absolute: 0.5 10*3/uL (ref 0.1–1.0)
Monocytes Relative: 4 %
Neutro Abs: 7.6 10*3/uL (ref 1.7–7.7)
Neutrophils Relative %: 61 %
Platelets: 279 10*3/uL (ref 150–400)
RBC: 4.39 MIL/uL (ref 3.87–5.11)
RDW: 12.9 % (ref 11.5–15.5)
WBC: 12.5 10*3/uL — ABNORMAL HIGH (ref 4.0–10.5)
nRBC: 0 % (ref 0.0–0.2)

## 2018-06-22 LAB — I-STAT BETA HCG BLOOD, ED (MC, WL, AP ONLY): I-stat hCG, quantitative: <5 m[IU]/mL (ref <5)

## 2018-06-22 LAB — COMPREHENSIVE METABOLIC PANEL
ALT: 19 U/L (ref 0–44)
AST: 19 U/L (ref 15–41)
Albumin: 4.1 g/dL (ref 3.5–5.0)
Alkaline Phosphatase: 81 U/L (ref 38–126)
Anion gap: 12 (ref 5–15)
BUN: 13 mg/dL (ref 6–20)
CO2: 19 mmol/L — ABNORMAL LOW (ref 22–32)
Calcium: 8.8 mg/dL — ABNORMAL LOW (ref 8.9–10.3)
Chloride: 106 mmol/L (ref 98–111)
Creatinine, Ser: 0.66 mg/dL (ref 0.44–1.00)
GFR calc Af Amer: >60 mL/min (ref >60)
GFR calc non Af Amer: >60 mL/min (ref >60)
Glucose, Bld: 104 mg/dL — ABNORMAL HIGH (ref 70–99)
Potassium: 3.8 mmol/L (ref 3.5–5.1)
Sodium: 137 mmol/L (ref 135–145)
Total Bilirubin: 0.3 mg/dL (ref 0.3–1.2)
Total Protein: 7.7 g/dL (ref 6.5–8.1)

## 2018-06-22 LAB — RAPID URINE DRUG SCREEN, HOSP PERFORMED
Amphetamines: NOT DETECTED
Barbiturates: NOT DETECTED
Benzodiazepines: NOT DETECTED
Cocaine: POSITIVE — AB
Opiates: NOT DETECTED
Tetrahydrocannabinol: NOT DETECTED

## 2018-06-22 LAB — SALICYLATE LEVEL: Salicylate Lvl: <7 mg/dL (ref 2.8–30.0)

## 2018-06-22 LAB — ACETAMINOPHEN LEVEL: Acetaminophen (Tylenol), Serum: <10 ug/mL — ABNORMAL LOW (ref 10–30)

## 2018-06-22 MED ORDER — ONDANSETRON 8 MG PO TBDP: 8.0000 mg | ORAL_TABLET | Freq: Three times a day (TID) | ORAL | Status: DC | PRN

## 2018-06-22 MED ORDER — HYDROXYZINE HCL 25 MG PO TABS: 50.0000 mg | ORAL_TABLET | Freq: Four times a day (QID) | ORAL | Status: DC | PRN

## 2018-06-22 MED ORDER — OLANZAPINE 5 MG PO TABS: 5.0000 mg | ORAL_TABLET | Freq: Every day | ORAL | Status: DC

## 2018-06-22 MED ORDER — LORAZEPAM 1 MG PO TABS: 1.0000 mg | ORAL_TABLET | Freq: Four times a day (QID) | ORAL | Status: DC | PRN

## 2018-06-22 MED ORDER — NICOTINE 14 MG/24HR TD PT24
14.0000 mg | MEDICATED_PATCH | Freq: Once | TRANSDERMAL | Status: DC
  Administered 2018-06-22: 14 mg via TRANSDERMAL
  Filled 2018-06-22: qty 1

## 2018-06-22 NOTE — ED Notes (Signed)
Pt given breakfast tray

## 2018-06-22 NOTE — ED Triage Notes (Signed)
Pt states she is SI, thinking about it for a long time, states she wants to be admitted. States to have tried to kill herself 'last year by cutting her wrists'. Reports using cocaine and alcohol )2 40oz beers). States auditory & visual hallucinations telling her to hurt herself

## 2018-06-22 NOTE — ED Notes (Signed)
Gave pt cup of GingerAle, toothebrush with toothpaste, and placed lotion in hand. Steward DroneBrenda Psychiatrist(sitter) has bottle of lotion for patient.

## 2018-06-22 NOTE — ED Notes (Signed)
TTS consult in progress. °

## 2018-06-22 NOTE — Progress Notes (Signed)
Pt. meets criteria for inpatient treatment per Donell SievertSpencer Simon, PA.  Referred out to the following hospitals: [facility]  [facility]  [facility]  [facility]  [facility]  [facility]  [facility]  [facility]  [facility]  [facility]  [facility]     Disposition CSW will continue to follow for placement.  Timmothy EulerJean T. Kaylyn LimSutter, MSW, LCSWA Disposition Clinical Social Work [phone] (cell) [phone] (office)
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
## 2018-06-22 NOTE — ED Notes (Signed)
Pt given lunch tray.

## 2018-06-22 NOTE — ED Provider Notes (Signed)
[facility] EMERGENCY DEPARTMENT Provider Note   CSN: [id number] Arrival date & time: 06/22/18  0315     History   Chief Complaint Chief Complaint  Patient presents with  . V70.1    HPI Susan [name] is a 42 y.o. female.  The history is provided by the patient.  Mental Health Problem  Presenting symptoms: suicidal thoughts   Degree of incapacity (severity):  Moderate Onset quality:  Gradual Timing:  Constant Progression:  Worsening Chronicity:  New Context: alcohol use and drug abuse   Relieved by:  Nothing Worsened by:  Drugs and alcohol Associated symptoms: anxiety   Associated symptoms: no abdominal pain   Associated symptoms comment:  Chronic back pain Patient with history of asthma, substance abuse, alcohol abuse, presents with suicidal thoughts.  She reports relapsing using cocaine and alcohol recently, she is now reporting thoughts of harming herself.  She has not attempted suicide as of yet.  She reports chronic back pain, but denies any other medical complaints  Past Medical History:  Diagnosis Date  . Alcohol abuse   . Asthma   . BV (bacterial vaginosis) 03/06/2013  . Chronic dental pain   . Chronic pelvic pain in female   . Depression   . Ectopic pregnancy   . Panic attacks   . Polysubstance abuse (HCC)    cocaine, opiates, marijuana  . Trichomonas vaginitis     Patient Active Problem List   Diagnosis Date Noted  . PTSD (post-traumatic stress disorder) 06/07/2017  . Substance induced mood disorder (HCC) 06/04/2017  . Preterm labor 04/10/2017  . Asymptomatic bacteriuria during pregnancy in second trimester 03/14/2017  . Late prenatal care in second trimester 03/08/2017  . Supervision of normal pregnancy 03/08/2017  . MDD (major depressive disorder), single episode, severe with psychotic features (HCC) 01/05/2017  . Alcohol use disorder, severe, dependence (HCC) 01/05/2017  . Cocaine use disorder, moderate, dependence (HCC) 01/05/2017  . Cannabis  use disorder, moderate, dependence (HCC) 01/05/2017  . Heavy smoker 02/03/2016  . Cocaine abuse affecting pregnancy, antepartum (HCC) 02/03/2016  . Chronic, continuous use of opioids 03/24/2015  . Depression with anxiety 03/24/2015  . Poor dentition 03/24/2015  . Sprain of ankle, unspecified site 05/28/2009    Past Surgical History:  Procedure Laterality Date  . ECTOPIC PREGNANCY SURGERY       OB History    Gravida  9   Para  5   Term  4   Preterm  1   AB  4   Living  5     SAB  1   TAB  2   Ectopic  1   Multiple  0   Live Births  5            Home Medications    Prior to Admission medications   Medication Sig Start Date End Date Taking? Authorizing Provider  clotrimazole (GYNE-LOTRIMIN) 1 % vaginal cream Place 1 Applicatorful vaginally at bedtime. 06/08/17   Money, Gerlene Burdockravis B, FNP  FLUoxetine (PROZAC) 20 MG capsule Take 1 capsule (20 mg total) by mouth daily. For mood control 06/09/17   Money, Feliz Beamravis B, FNP  hydrOXYzine (ATARAX/VISTARIL) 50 MG tablet Take 1 tablet (50 mg total) by mouth every 6 (six) hours as needed for anxiety. 06/08/17   Money, Gerlene Burdockravis B, FNP  naproxen (NAPROSYN) 500 MG tablet Take 1 tablet (500 mg total) by mouth 2 (two) times daily with a meal. 12/26/17   Triplett, Tammy, PA-C  OLANZapine (ZYPREXA) 5 MG tablet  Take 1 tablet (5 mg total) by mouth at bedtime. For mood control 06/08/17   Money, Gerlene Burdock, FNP  traZODone (DESYREL) 100 MG tablet Take 1 tablet (100 mg total) by mouth at bedtime and may repeat dose one time if needed. 06/08/17   Money, Gerlene Burdock, FNP    Family History Family History  Problem Relation Age of Onset  . COPD Mother   . Stroke Mother   . Heart disease Maternal Grandmother   . Hypertension Maternal Grandmother   . Stroke Maternal Grandfather   . Asthma Sister   . Down syndrome Sister     Social History Social History   Tobacco Use  . Smoking status: Current Every Day Smoker    Packs/day: 1.00    Types:  Cigarettes  . Smokeless tobacco: Never Used  Substance Use Topics  . Alcohol use: Yes    Comment: last use 3 days ago,   . Drug use: Yes    Types: Marijuana, Cocaine    Comment: last week     Allergies   Patient has no known allergies.   Review of Systems Review of Systems  Constitutional: Negative for fever.  Respiratory: Negative for shortness of breath.   Gastrointestinal: Negative for abdominal pain.  Musculoskeletal: Positive for back pain.       Chronic back pain   Psychiatric/Behavioral: Positive for suicidal ideas. The patient is nervous/anxious.   All other systems reviewed and are negative.    Physical Exam Updated Vital Signs BP (!) 99/59 (BP Location: Left Arm)   Pulse (!) 113   Temp 99.3 F (37.4 C) (Oral)   Resp 14   SpO2 94%   Physical Exam CONSTITUTIONAL: Disheveled HEAD: Normocephalic/atraumatic EYES: EOMI/PERRL ENMT: Mucous membranes moist NECK: supple no meningeal signs SPINE/BACK:entire spine nontender CV: S1/S2 noted, no murmurs/rubs/gallops noted LUNGS: Lungs are clear to auscultation bilaterally, no apparent distress ABDOMEN: soft, nontender GU:no cva tenderness NEURO: Pt is awake/alert/appropriate, moves all extremitiesx4.  No facial droop.   EXTREMITIES: pulses normal/equal, full ROM SKIN: warm, color normal PSYCH: Mildly anxious  ED Treatments / Results  Labs (all labs ordered are listed, but only abnormal results are displayed) Labs Reviewed  RAPID URINE DRUG SCREEN, HOSP PERFORMED - Abnormal; Notable for the following components:      Result Value   Cocaine POSITIVE (*)    All other components within normal limits  CBC WITH DIFFERENTIAL/PLATELET  COMPREHENSIVE METABOLIC PANEL  ACETAMINOPHEN LEVEL  ETHANOL  SALICYLATE LEVEL  I-STAT BETA HCG BLOOD, ED ([facility], [facility], [facility] ONLY)    EKG None  Radiology No results found.  Procedures Procedures    Medications Ordered in ED Medications  hydrOXYzine (ATARAX/VISTARIL) tablet 50  mg (has no administration in time range)  OLANZapine (ZYPREXA) tablet 5 mg (has no administration in time range)  LORazepam (ATIVAN) tablet 1 mg (has no administration in time range)     Initial Impression / Assessment and Plan / ED Course  I have reviewed the triage vital signs and the nursing notes.  Pertinent labs results that were available during my care of the patient were reviewed by me and considered in my medical decision making (see chart for details).     6:18 AM Patient history of polysubstance abuse presents with suicidal thoughts.  She has been seen by behavioral health who is working on psychiatric placement.  Patient is medically stable  Final Clinical Impressions(s) / ED Diagnoses   Final diagnoses:  Substance abuse (HCC)  Suicidal ideation  ED Discharge Orders    None       Zadie Rhine, MD 06/22/18 [phone]

## 2018-06-22 NOTE — ED Notes (Signed)
Pt taking shower at this time  

## 2018-06-22 NOTE — Progress Notes (Signed)
Pt accepted to [facility]. Dr. Maryelizabeth KaufmannMunoz is the accepting/attending provider.   Call report to [phone] [facility] notified.  Pt is voluntary and can be transported by [facility].   [facility] as soon as transportation can be arranged.   Wells GuilesSarah Gianny Sabino, LCSW, LCAS Disposition CSW [facility]/TTS [phone]

## 2018-06-22 NOTE — ED Notes (Signed)
Pt escorted to shower by a NT
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
## 2018-09-03 ENCOUNTER — Encounter (HOSPITAL_COMMUNITY): Payer: Self-pay | Admitting: Emergency Medicine

## 2018-09-03 ENCOUNTER — Emergency Department (HOSPITAL_COMMUNITY)
Admission: EM | Admit: 2018-09-03 | Discharge: 2018-09-03 | Disposition: A | Discharge status: Home / Self Care | Payer: Self-pay | Attending: Emergency Medicine | Admitting: Emergency Medicine

## 2018-09-03 ENCOUNTER — Other Ambulatory Visit: Payer: Self-pay

## 2018-09-03 DIAGNOSIS — R21 Rash and other nonspecific skin eruption: Secondary | ICD-10-CM | POA: Insufficient documentation

## 2018-09-03 DIAGNOSIS — F17210 Nicotine dependence, cigarettes, uncomplicated: Secondary | ICD-10-CM | POA: Insufficient documentation

## 2018-09-03 DIAGNOSIS — R102 Pelvic and perineal pain: Secondary | ICD-10-CM

## 2018-09-03 DIAGNOSIS — Z79899 Other long term (current) drug therapy: Secondary | ICD-10-CM | POA: Insufficient documentation

## 2018-09-03 LAB — COMPREHENSIVE METABOLIC PANEL
ALT: 28 U/L (ref 0–44)
AST: 23 U/L (ref 15–41)
Albumin: 3.6 g/dL (ref 3.5–5.0)
Alkaline Phosphatase: 79 U/L (ref 38–126)
Anion gap: 9 (ref 5–15)
BUN: 11 mg/dL (ref 6–20)
CO2: 21 mmol/L — ABNORMAL LOW (ref 22–32)
Calcium: 8.9 mg/dL (ref 8.9–10.3)
Chloride: 106 mmol/L (ref 98–111)
Creatinine, Ser: 0.79 mg/dL (ref 0.44–1.00)
GFR calc Af Amer: >60 mL/min (ref >60)
GFR calc non Af Amer: >60 mL/min (ref >60)
Glucose, Bld: 147 mg/dL — ABNORMAL HIGH (ref 70–99)
Potassium: 3.6 mmol/L (ref 3.5–5.1)
Sodium: 136 mmol/L (ref 135–145)
Total Bilirubin: 0.2 mg/dL — ABNORMAL LOW (ref 0.3–1.2)
Total Protein: 6.8 g/dL (ref 6.5–8.1)

## 2018-09-03 LAB — CBC WITH DIFFERENTIAL/PLATELET
Abs Immature Granulocytes: 0.05 10*3/uL (ref 0.00–0.07)
Basophils Absolute: 0.1 10*3/uL (ref 0.0–0.1)
Basophils Relative: 1 %
Eosinophils Absolute: 0.2 10*3/uL (ref 0.0–0.5)
Eosinophils Relative: 2 %
HCT: 38.1 % (ref 36.0–46.0)
Hemoglobin: 12.1 g/dL (ref 12.0–15.0)
Immature Granulocytes: 0 %
Lymphocytes Relative: 24 %
Lymphs Abs: 3.1 10*3/uL (ref 0.7–4.0)
MCH: 29.7 pg (ref 26.0–34.0)
MCHC: 31.8 g/dL (ref 30.0–36.0)
MCV: 93.4 fL (ref 80.0–100.0)
Monocytes Absolute: 0.5 10*3/uL (ref 0.1–1.0)
Monocytes Relative: 4 %
Neutro Abs: 8.8 10*3/uL — ABNORMAL HIGH (ref 1.7–7.7)
Neutrophils Relative %: 69 %
Platelets: 272 10*3/uL (ref 150–400)
RBC: 4.08 MIL/uL (ref 3.87–5.11)
RDW: 12.8 % (ref 11.5–15.5)
WBC: 12.8 10*3/uL — ABNORMAL HIGH (ref 4.0–10.5)
nRBC: 0 % (ref 0.0–0.2)

## 2018-09-03 LAB — URINALYSIS, ROUTINE W REFLEX MICROSCOPIC
Bilirubin Urine: NEGATIVE
Glucose, UA: NEGATIVE mg/dL
Hgb urine dipstick: NEGATIVE
Ketones, ur: NEGATIVE mg/dL
Leukocytes, UA: NEGATIVE
Nitrite: NEGATIVE
Protein, ur: NEGATIVE mg/dL
Specific Gravity, Urine: 1.005 (ref 1.005–1.030)
pH: 6 (ref 5.0–8.0)

## 2018-09-03 LAB — PREGNANCY, URINE: Preg Test, Ur: NEGATIVE

## 2018-09-03 MED ORDER — AZITHROMYCIN 250 MG PO TABS
ORAL_TABLET | ORAL | Status: AC
  Filled 2018-09-03: qty 2

## 2018-09-03 MED ORDER — LIDOCAINE HCL (PF) 1 % IJ SOLN
INTRAMUSCULAR | Status: AC
  Administered 2018-09-03: 2 mL
  Filled 2018-09-03: qty 2

## 2018-09-03 MED ORDER — DIPHENHYDRAMINE HCL 25 MG PO CAPS
25.0000 mg | ORAL_CAPSULE | Freq: Once | ORAL | Status: AC
  Administered 2018-09-03: 25 mg via ORAL
  Filled 2018-09-03: qty 1

## 2018-09-03 MED ORDER — CEFTRIAXONE SODIUM 250 MG IJ SOLR
250.0000 mg | Freq: Once | INTRAMUSCULAR | Status: AC
  Administered 2018-09-03: 250 mg via INTRAMUSCULAR
  Filled 2018-09-03: qty 250

## 2018-09-03 MED ORDER — HYDROCODONE-ACETAMINOPHEN 5-325 MG PO TABS
1.0000 | ORAL_TABLET | Freq: Once | ORAL | Status: AC
  Administered 2018-09-03: 1 via ORAL
  Filled 2018-09-03: qty 1

## 2018-09-03 MED ORDER — AZITHROMYCIN 250 MG PO TABS
1000.0000 mg | ORAL_TABLET | Freq: Once | ORAL | Status: AC
  Administered 2018-09-03: 1000 mg via ORAL
  Filled 2018-09-03: qty 4

## 2018-09-03 MED ORDER — IBUPROFEN 800 MG PO TABS: 800.0000 mg | ORAL_TABLET | Freq: Three times a day (TID) | ORAL | 1 refills | Status: DC | PRN

## 2018-09-03 NOTE — ED Provider Notes (Signed)
[facility] EMERGENCY DEPARTMENT Provider Note   CSN: [id number] Arrival date & time: 09/03/18  1301     History   Chief Complaint Chief Complaint  Patient presents with  . Rash    x 1 month  . Abdominal Pain    x 1 week    HPI Susan [name] is a 43 y.o. female.  Patient complains of lower abdominal pain and discharge.  The history is provided by the patient. No language interpreter was used.  Abdominal Pain  Pain location:  Suprapubic Pain quality: aching   Pain radiates to:  Does not radiate Pain severity:  Moderate Onset quality:  Sudden Timing:  Constant Progression:  Worsening Chronicity:  New Context: not alcohol use   Relieved by:  Nothing Worsened by:  Nothing Associated symptoms: no chest pain, no cough, no diarrhea, no fatigue and no hematuria     Past Medical History:  Diagnosis Date  . Alcohol abuse   . Asthma   . BV (bacterial vaginosis) 03/06/2013  . Chronic dental pain   . Chronic pelvic pain in female   . Depression   . Ectopic pregnancy   . Panic attacks   . Polysubstance abuse (HCC)    cocaine, opiates, marijuana  . Trichomonas vaginitis     Patient Active Problem List   Diagnosis Date Noted  . PTSD (post-traumatic stress disorder) 06/07/2017  . Substance induced mood disorder (HCC) 06/04/2017  . Preterm labor 04/10/2017  . Asymptomatic bacteriuria during pregnancy in second trimester 03/14/2017  . Late prenatal care in second trimester 03/08/2017  . Supervision of normal pregnancy 03/08/2017  . MDD (major depressive disorder), single episode, severe with psychotic features (HCC) 01/05/2017  . Alcohol use disorder, severe, dependence (HCC) 01/05/2017  . Cocaine use disorder, moderate, dependence (HCC) 01/05/2017  . Cannabis use disorder, moderate, dependence (HCC) 01/05/2017  . Heavy smoker 02/03/2016  . Cocaine abuse affecting pregnancy, antepartum (HCC) 02/03/2016  . Chronic, continuous use of opioids 03/24/2015  . Depression  with anxiety 03/24/2015  . Poor dentition 03/24/2015  . Sprain of ankle, unspecified site 05/28/2009    Past Surgical History:  Procedure Laterality Date  . ECTOPIC PREGNANCY SURGERY       OB History    Gravida  9   Para  5   Term  4   Preterm  1   AB  4   Living  5     SAB  1   TAB  2   Ectopic  1   Multiple  0   Live Births  5            Home Medications    Prior to Admission medications   Medication Sig Start Date End Date Taking? Authorizing Provider  ibuprofen (ADVIL,MOTRIN) 800 MG tablet Take 1 tablet (800 mg total) by mouth every 8 (eight) hours as needed for moderate pain. 09/03/18   Bethann Berkshire, MD  OLANZapine (ZYPREXA) 5 MG tablet Take 1 tablet (5 mg total) by mouth at bedtime. For mood control 06/08/17   Money, Gerlene Burdock, FNP    Family History Family History  Problem Relation Age of Onset  . COPD Mother   . Stroke Mother   . Heart disease Maternal Grandmother   . Hypertension Maternal Grandmother   . Stroke Maternal Grandfather   . Asthma Sister   . Down syndrome Sister     Social History Social History   Tobacco Use  . Smoking status: Current Every Day Smoker  Packs/day: 1.00    Types: Cigarettes  . Smokeless tobacco: Never Used  Substance Use Topics  . Alcohol use: Not Currently    Comment: last use 3 days ago,   . Drug use: Yes    Types: Marijuana, Cocaine    Comment: last week     Allergies   Patient has no known allergies.   Review of Systems Review of Systems  Constitutional: Negative for appetite change and fatigue.  HENT: Negative for congestion, ear discharge and sinus pressure.   Eyes: Negative for discharge.  Respiratory: Negative for cough.   Cardiovascular: Negative for chest pain.  Gastrointestinal: Positive for abdominal pain. Negative for diarrhea.  Genitourinary: Negative for frequency and hematuria.  Musculoskeletal: Negative for back pain.  Skin: Negative for rash.  Neurological: Negative for  seizures and headaches.  Psychiatric/Behavioral: Negative for hallucinations.     Physical Exam Updated Vital Signs BP 107/67 (BP Location: Left Arm)   Pulse 80   Temp 98 F (36.7 C) (Oral)   Resp 18   Ht 5\' 1"  (1.549 m)   Wt 77.1 kg   LMP 07/23/2018   SpO2 97%   BMI 32.12 kg/m   Physical Exam Vitals signs and nursing note reviewed.  Constitutional:      Appearance: She is well-developed.  HENT:     Head: Normocephalic.     Nose: Nose normal.  Eyes:     General: No scleral icterus.    Conjunctiva/sclera: Conjunctivae normal.  Neck:     Musculoskeletal: Neck supple.     Thyroid: No thyromegaly.  Cardiovascular:     Rate and Rhythm: Normal rate and regular rhythm.     Heart sounds: No murmur. No friction rub. No gallop.   Pulmonary:     Breath sounds: No stridor. No wheezing or rales.  Chest:     Chest wall: No tenderness.  Abdominal:     General: There is no distension.     Tenderness: There is abdominal tenderness. There is no rebound.  Genitourinary:    Comments: Tender cervix and adenexal Musculoskeletal: Normal range of motion.  Lymphadenopathy:     Cervical: No cervical adenopathy.  Skin:    Findings: No erythema or rash.  Neurological:     Mental Status: She is oriented to person, place, and time.     Motor: No abnormal muscle tone.     Coordination: Coordination normal.  Psychiatric:        Behavior: Behavior normal.      ED Treatments / Results  Labs (all labs ordered are listed, but only abnormal results are displayed) Labs Reviewed  URINALYSIS, ROUTINE W REFLEX MICROSCOPIC - Abnormal; Notable for the following components:      Result Value   Color, Urine STRAW (*)    All other components within normal limits  CBC WITH DIFFERENTIAL/PLATELET - Abnormal; Notable for the following components:   WBC 12.8 (*)    Neutro Abs 8.8 (*)    All other components within normal limits  COMPREHENSIVE METABOLIC PANEL - Abnormal; Notable for the following  components:   CO2 21 (*)    Glucose, Bld 147 (*)    Total Bilirubin 0.2 (*)    All other components within normal limits  PREGNANCY, URINE  GC/CHLAMYDIA PROBE AMP () [facility]    EKG None  Radiology No results found.  Procedures Procedures (including critical care time)  Medications Ordered in ED Medications  HYDROcodone-acetaminophen (NORCO/VICODIN) 5-325 MG per tablet 1 tablet (  1 tablet Oral Given 09/03/18 1858)  diphenhydrAMINE (BENADRYL) capsule 25 mg (25 mg Oral Given 09/03/18 1857)  cefTRIAXone (ROCEPHIN) injection 250 mg (250 mg Intramuscular Given 09/03/18 1858)  azithromycin (ZITHROMAX) tablet 1,000 mg ( Oral Canceled Entry 09/03/18 1918)  lidocaine (PF) (XYLOCAINE) 1 % injection (2 mLs  Given 09/03/18 1900)     Initial Impression / Assessment and Plan / ED Course  I have reviewed the triage vital signs and the nursing notes.  Pertinent labs & imaging results that were available during my care of the patient were reviewed by me and considered in my medical decision making (see chart for details).     Patient with pelvic pain.  She is treated for STDs and will follow-up with either the health department or Dr. Emelda FearFerguson  Final Clinical Impressions(s) / ED Diagnoses   Final diagnoses:  Pelvic pain    ED Discharge Orders         Ordered    ibuprofen (ADVIL,MOTRIN) 800 MG tablet  Every 8 hours PRN     09/03/18 Margarette Asal2000           Demarkis Gheen, MD 09/03/18 2006

## 2018-09-03 NOTE — Discharge Instructions (Addendum)
Take Benadryl for itching.  Follow-up with Dr. Emelda Fear or the health department next week for your abdominal discomfort

## 2018-09-03 NOTE — ED Triage Notes (Signed)
No doctor No money per pt   Rash to creases of arms and appendages x 1 month with itching  Lower abd pain x 1 week  Reports she may be pregnant has not done a home pregnancy test due to  No money

## 2018-09-04 LAB — GC/CHLAMYDIA PROBE AMP (~~LOC~~) NOT AT ARMC
Chlamydia: NEGATIVE
Neisseria Gonorrhea: NEGATIVE

## 2018-10-01 ENCOUNTER — Emergency Department (HOSPITAL_COMMUNITY)
Admission: EM | Admit: 2018-10-01 | Discharge: 2018-10-01 | Discharge status: Against Medical Advice | Payer: Self-pay | Attending: Emergency Medicine | Admitting: Emergency Medicine

## 2018-10-01 ENCOUNTER — Encounter (HOSPITAL_COMMUNITY): Payer: Self-pay | Admitting: Emergency Medicine

## 2018-10-01 ENCOUNTER — Other Ambulatory Visit: Payer: Self-pay

## 2018-10-01 DIAGNOSIS — R109 Unspecified abdominal pain: Secondary | ICD-10-CM | POA: Insufficient documentation

## 2018-10-01 DIAGNOSIS — Z5321 Procedure and treatment not carried out due to patient leaving prior to being seen by health care provider: Secondary | ICD-10-CM | POA: Insufficient documentation

## 2018-10-01 MED ORDER — SODIUM CHLORIDE 0.9% FLUSH: 3.0000 mL | Freq: Once | INTRAVENOUS | Status: DC

## 2018-10-01 NOTE — ED Triage Notes (Signed)
Patient complains of abdominal pain and nausea  x 1 week. Denies vomiting and diarrhea.

## 2018-10-01 NOTE — ED Notes (Signed)
Pt states she wants to leave she will come back another day.

## 2018-10-04 ENCOUNTER — Telehealth: Payer: Self-pay | Admitting: Physician Assistant

## 2018-10-23 ENCOUNTER — Ambulatory Visit: Payer: Self-pay | Admitting: Physician Assistant

## 2018-11-06 ENCOUNTER — Ambulatory Visit: Payer: Self-pay | Admitting: Physician Assistant

## 2018-11-06 ENCOUNTER — Encounter: Payer: Self-pay | Admitting: Physician Assistant

## 2018-11-06 ENCOUNTER — Other Ambulatory Visit: Payer: Self-pay

## 2018-11-06 VITALS — BP 104/60 | HR 92 | Temp 97.9°F

## 2018-11-06 DIAGNOSIS — F39 Unspecified mood [affective] disorder: Secondary | ICD-10-CM

## 2018-11-06 DIAGNOSIS — F17200 Nicotine dependence, unspecified, uncomplicated: Secondary | ICD-10-CM

## 2018-11-06 DIAGNOSIS — F1910 Other psychoactive substance abuse, uncomplicated: Secondary | ICD-10-CM

## 2018-11-06 DIAGNOSIS — R109 Unspecified abdominal pain: Secondary | ICD-10-CM

## 2018-11-06 DIAGNOSIS — H6692 Otitis media, unspecified, left ear: Secondary | ICD-10-CM

## 2018-11-06 DIAGNOSIS — Z7689 Persons encountering health services in other specified circumstances: Secondary | ICD-10-CM

## 2018-11-06 LAB — POCT URINALYSIS DIPSTICK
Bilirubin, UA: NEGATIVE
Blood, UA: NEGATIVE
Glucose, UA: NEGATIVE
Ketones, UA: NEGATIVE
Nitrite, UA: NEGATIVE
Protein, UA: POSITIVE — AB
Spec Grav, UA: >=1.03 — AB (ref 1.010–1.025)
Urobilinogen, UA: 0.2 E.U./dL
pH, UA: 5 (ref 5.0–8.0)

## 2018-11-06 MED ORDER — AMOXICILLIN 500 MG PO CAPS: 500.0000 mg | ORAL_CAPSULE | Freq: Three times a day (TID) | ORAL | 0 refills | Status: DC

## 2018-11-06 NOTE — Progress Notes (Signed)
BP 104/60 (BP Location: Left Arm, Patient Position: Sitting, Cuff Size: Normal)   Pulse 92   Temp 97.9 F (36.6 C)   SpO2 97%    Subjective:    Patient ID: Susan [name], female    DOB: Aug 08, 1976, 43 y.o.   MRN: [id number]  HPI: Susan [name] is a 43 y.o. female presenting on 11/06/2018 for New Patient (Initial Visit) and Pelvic Pain (for about a month. pt states she was treated for this at ER visit at [facility] about a month ago. )   HPI   Chief Complaint  Patient presents with  . New Patient (Initial Visit)  . Pelvic Pain    for about a month. pt states she was treated for this at ER visit at [facility] about a month ago.     Pt says she had PCP in [place] about a year ago but she doesn't who it was.  She say she had medicaid at the time.    Pt goes to [facility] for MH issues.    Pt was treated for pelvic pain/PID in ER in January.  She went to ER again in February with pain but LWBS.   She was seen in November for substance abuse and SI.  Pt had inpatient treatment for Psychosis with SI in 2018.    Pt states abdominal pain she is having now is same as she had in January when seen in ER. States pain is constant.  Pressure when she pees.  LMP last month (not late).   She is sexually active and does not use condoms.    Pt satates she is taking 2 meds from [facility] that she gets from [facility].  Pharmacy was contacted by says she hasn't gotten Rx from there since July 2019  a1c in 2018 was 5.3, lipids good,  tsh normal    Relevant past medical, surgical, family and social history reviewed and updated as indicated. Interim medical history since our last visit reviewed. Allergies and medications reviewed and updated.  CURRENT MEDS 2 meds from [facility] but she doesn't remember what they are  Review of Systems  Constitutional: Positive for appetite change, fatigue and unexpected weight change. Negative for chills, diaphoresis and fever.  HENT: Positive for  ear pain and facial swelling. Negative for congestion, drooling, hearing loss, mouth sores, sneezing, sore throat, trouble swallowing and voice change.   Eyes: Negative for pain, discharge, redness, itching and visual disturbance.  Respiratory: Negative for cough, choking, shortness of breath and wheezing.   Cardiovascular: Negative for chest pain, palpitations and leg swelling.  Gastrointestinal: Positive for abdominal pain. Negative for blood in stool, constipation, diarrhea and vomiting.  Endocrine: Negative for cold intolerance, heat intolerance and polydipsia.  Genitourinary: Positive for dysuria. Negative for decreased urine volume and hematuria.  Musculoskeletal: Positive for back pain. Negative for arthralgias and gait problem.  Skin: Negative for rash.  Allergic/Immunologic: Negative for environmental allergies.  Neurological: Negative for seizures, syncope, light-headedness and headaches.  Hematological: Negative for adenopathy.  Psychiatric/Behavioral: Positive for dysphoric mood. Negative for agitation and suicidal ideas. The patient is nervous/anxious.     Per HPI unless specifically indicated above     Objective:    BP 104/60 (BP Location: Left Arm, Patient Position: Sitting, Cuff Size: Normal)   Pulse 92   Temp 97.9 F (36.6 C)   SpO2 97%   Wt Readings from Last 3 Encounters:  10/01/18 170 lb (77.1 kg)  09/03/18 170 lb (77.1  kg)  12/26/17 140 lb (63.5 kg)    Physical Exam Vitals signs reviewed.  Constitutional:      Appearance: She is well-developed.  HENT:     Head: Normocephalic and atraumatic.     Right Ear: Tympanic membrane normal.     Left Ear: Tympanic membrane is erythematous.     Mouth/Throat:     Pharynx: No oropharyngeal exudate.  Eyes:     Conjunctiva/sclera: Conjunctivae normal.     Pupils: Pupils are equal, round, and reactive to light.  Neck:     Musculoskeletal: Neck supple.     Thyroid: No thyromegaly.  Cardiovascular:     Rate and  Rhythm: Normal rate and regular rhythm.  Pulmonary:     Effort: Pulmonary effort is normal.     Breath sounds: Normal breath sounds.  Abdominal:     General: Abdomen is protuberant. Bowel sounds are normal.     Palpations: Abdomen is soft. There is no mass.     Tenderness: There is abdominal tenderness in the suprapubic area. There is no right CVA tenderness, left CVA tenderness, guarding or rebound.  Lymphadenopathy:     Cervical: No cervical adenopathy.  Skin:    General: Skin is warm and dry.  Neurological:     Mental Status: She is alert and oriented to person, place, and time.     Gait: Gait normal.  Psychiatric:        Behavior: Behavior normal.    UA wnl     Assessment & Plan:    Encounter Diagnoses  Name Primary?  . Encounter to establish care Yes  . Abdominal pain, unspecified abdominal location   . Left otitis media, unspecified otitis media type   . Tobacco use disorder   . Substance abuse (HCC)   . Mood disorder (HCC)     -Pt instructed to apply for medicaid in hopes of getting FP medicaid- she needs to go to [facility] to get STD testing.  Also encouraged family planning.   -rx amoxil for LOM -Pt to notify office after STD testing/treament if still having abdominal pain for further evaluation and treatment -pt to continue with [facility] for mood disorder -will schedule pt to RTO in 3 months.  She is to call office sooner as above or for other reason if needed

## 2019-01-20 ENCOUNTER — Encounter (HOSPITAL_COMMUNITY): Payer: Self-pay | Admitting: Emergency Medicine

## 2019-01-20 ENCOUNTER — Emergency Department (HOSPITAL_COMMUNITY)
Admission: EM | Admit: 2019-01-20 | Discharge: 2019-01-20 | Payer: Self-pay | Attending: Emergency Medicine | Admitting: Emergency Medicine

## 2019-01-20 ENCOUNTER — Other Ambulatory Visit: Payer: Self-pay

## 2019-01-20 DIAGNOSIS — F17210 Nicotine dependence, cigarettes, uncomplicated: Secondary | ICD-10-CM | POA: Insufficient documentation

## 2019-01-20 DIAGNOSIS — Y929 Unspecified place or not applicable: Secondary | ICD-10-CM | POA: Insufficient documentation

## 2019-01-20 DIAGNOSIS — F10920 Alcohol use, unspecified with intoxication, uncomplicated: Secondary | ICD-10-CM

## 2019-01-20 DIAGNOSIS — Y939 Activity, unspecified: Secondary | ICD-10-CM | POA: Insufficient documentation

## 2019-01-20 DIAGNOSIS — S80211A Abrasion, right knee, initial encounter: Secondary | ICD-10-CM

## 2019-01-20 DIAGNOSIS — W1830XA Fall on same level, unspecified, initial encounter: Secondary | ICD-10-CM | POA: Insufficient documentation

## 2019-01-20 DIAGNOSIS — Y999 Unspecified external cause status: Secondary | ICD-10-CM | POA: Insufficient documentation

## 2019-01-20 DIAGNOSIS — Z23 Encounter for immunization: Secondary | ICD-10-CM | POA: Insufficient documentation

## 2019-01-20 MED ORDER — TETANUS-DIPHTH-ACELL PERTUSSIS 5-2.5-18.5 LF-MCG/0.5 IM SUSP
0.5000 mL | Freq: Once | INTRAMUSCULAR | Status: AC
  Administered 2019-01-20: 0.5 mL via INTRAMUSCULAR
  Filled 2019-01-20: qty 0.5

## 2019-01-20 NOTE — Discharge Instructions (Addendum)
Patient safe for discharge with [employer] escort.
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
## 2019-01-22 ENCOUNTER — Emergency Department (HOSPITAL_COMMUNITY)
Admission: EM | Admit: 2019-01-22 | Discharge: 2019-01-23 | Disposition: A | Discharge status: Other Acute Inpatient Hospital | Payer: Self-pay | Attending: Emergency Medicine | Admitting: Emergency Medicine

## 2019-01-22 ENCOUNTER — Other Ambulatory Visit: Payer: Self-pay

## 2019-01-22 ENCOUNTER — Encounter (HOSPITAL_COMMUNITY): Payer: Self-pay | Admitting: Emergency Medicine

## 2019-01-22 DIAGNOSIS — G8929 Other chronic pain: Secondary | ICD-10-CM | POA: Insufficient documentation

## 2019-01-22 DIAGNOSIS — N898 Other specified noninflammatory disorders of vagina: Secondary | ICD-10-CM

## 2019-01-22 DIAGNOSIS — R45851 Suicidal ideations: Secondary | ICD-10-CM

## 2019-01-22 DIAGNOSIS — R102 Pelvic and perineal pain unspecified side: Secondary | ICD-10-CM

## 2019-01-22 DIAGNOSIS — Z008 Encounter for other general examination: Secondary | ICD-10-CM | POA: Insufficient documentation

## 2019-01-22 DIAGNOSIS — J45909 Unspecified asthma, uncomplicated: Secondary | ICD-10-CM | POA: Insufficient documentation

## 2019-01-22 DIAGNOSIS — F329 Major depressive disorder, single episode, unspecified: Secondary | ICD-10-CM | POA: Insufficient documentation

## 2019-01-22 DIAGNOSIS — F17210 Nicotine dependence, cigarettes, uncomplicated: Secondary | ICD-10-CM | POA: Insufficient documentation

## 2019-01-22 LAB — COMPREHENSIVE METABOLIC PANEL
ALT: 32 U/L (ref 0–44)
AST: 30 U/L (ref 15–41)
Albumin: 4 g/dL (ref 3.5–5.0)
Alkaline Phosphatase: 85 U/L (ref 38–126)
Anion gap: 10 (ref 5–15)
BUN: 9 mg/dL (ref 6–20)
CO2: 22 mmol/L (ref 22–32)
Calcium: 8.9 mg/dL (ref 8.9–10.3)
Chloride: 105 mmol/L (ref 98–111)
Creatinine, Ser: 0.77 mg/dL (ref 0.44–1.00)
GFR calc Af Amer: >60 mL/min (ref >60)
GFR calc non Af Amer: >60 mL/min (ref >60)
Glucose, Bld: 133 mg/dL — ABNORMAL HIGH (ref 70–99)
Potassium: 3.7 mmol/L (ref 3.5–5.1)
Sodium: 137 mmol/L (ref 135–145)
Total Bilirubin: 0.3 mg/dL (ref 0.3–1.2)
Total Protein: 7 g/dL (ref 6.5–8.1)

## 2019-01-22 LAB — RAPID URINE DRUG SCREEN, HOSP PERFORMED
Amphetamines: NOT DETECTED
Barbiturates: NOT DETECTED
Benzodiazepines: NOT DETECTED
Cocaine: POSITIVE — AB
Opiates: NOT DETECTED
Tetrahydrocannabinol: NOT DETECTED

## 2019-01-22 LAB — CBC WITH DIFFERENTIAL/PLATELET
Abs Immature Granulocytes: 0.04 10*3/uL (ref 0.00–0.07)
Basophils Absolute: 0.1 10*3/uL (ref 0.0–0.1)
Basophils Relative: 0 %
Eosinophils Absolute: 0.1 10*3/uL (ref 0.0–0.5)
Eosinophils Relative: 1 %
HCT: 41.3 % (ref 36.0–46.0)
Hemoglobin: 13.7 g/dL (ref 12.0–15.0)
Immature Granulocytes: 0 %
Lymphocytes Relative: 30 %
Lymphs Abs: 3.5 10*3/uL (ref 0.7–4.0)
MCH: 31.3 pg (ref 26.0–34.0)
MCHC: 33.2 g/dL (ref 30.0–36.0)
MCV: 94.3 fL (ref 80.0–100.0)
Monocytes Absolute: 0.8 10*3/uL (ref 0.1–1.0)
Monocytes Relative: 7 %
Neutro Abs: 7.3 10*3/uL (ref 1.7–7.7)
Neutrophils Relative %: 62 %
Platelets: 246 10*3/uL (ref 150–400)
RBC: 4.38 MIL/uL (ref 3.87–5.11)
RDW: 13 % (ref 11.5–15.5)
WBC: 11.8 10*3/uL — ABNORMAL HIGH (ref 4.0–10.5)
nRBC: 0 % (ref 0.0–0.2)

## 2019-01-22 LAB — URINALYSIS, ROUTINE W REFLEX MICROSCOPIC
Bilirubin Urine: NEGATIVE
Glucose, UA: NEGATIVE mg/dL
Ketones, ur: NEGATIVE mg/dL
Leukocytes,Ua: NEGATIVE
Nitrite: NEGATIVE
Protein, ur: NEGATIVE mg/dL
Specific Gravity, Urine: 1.019 (ref 1.005–1.030)
pH: 5 (ref 5.0–8.0)

## 2019-01-22 LAB — WET PREP, GENITAL
Clue Cells Wet Prep HPF POC: NONE SEEN
Sperm: NONE SEEN
Trich, Wet Prep: NONE SEEN
WBC, Wet Prep HPF POC: NONE SEEN
Yeast Wet Prep HPF POC: NONE SEEN

## 2019-01-22 LAB — SALICYLATE LEVEL: Salicylate Lvl: <7 mg/dL (ref 2.8–30.0)

## 2019-01-22 LAB — I-STAT BETA HCG BLOOD, ED (MC, WL, AP ONLY): I-stat hCG, quantitative: <5 m[IU]/mL (ref <5)

## 2019-01-22 LAB — ACETAMINOPHEN LEVEL: Acetaminophen (Tylenol), Serum: <10 ug/mL — ABNORMAL LOW (ref 10–30)

## 2019-01-22 LAB — ETHANOL: Alcohol, Ethyl (B): <10 mg/dL (ref <10)

## 2019-01-22 MED ORDER — ZOLPIDEM TARTRATE 5 MG PO TABS: 5.0000 mg | ORAL_TABLET | Freq: Every evening | ORAL | Status: DC | PRN

## 2019-01-22 MED ORDER — LORAZEPAM 1 MG PO TABS: 0.0000 mg | ORAL_TABLET | Freq: Two times a day (BID) | ORAL | Status: DC

## 2019-01-22 MED ORDER — ALUM & MAG HYDROXIDE-SIMETH 200-200-20 MG/5ML PO SUSP: 30.0000 mL | Freq: Four times a day (QID) | ORAL | Status: DC | PRN

## 2019-01-22 MED ORDER — THIAMINE HCL 100 MG/ML IJ SOLN: 100.0000 mg | Freq: Every day | INTRAMUSCULAR | Status: DC

## 2019-01-22 MED ORDER — NICOTINE 21 MG/24HR TD PT24
21.0000 mg | MEDICATED_PATCH | Freq: Every day | TRANSDERMAL | Status: DC
  Administered 2019-01-23: 21 mg via TRANSDERMAL
  Filled 2019-01-22: qty 1

## 2019-01-22 MED ORDER — VITAMIN B-1 100 MG PO TABS
100.0000 mg | ORAL_TABLET | Freq: Every day | ORAL | Status: DC
  Administered 2019-01-23: 100 mg via ORAL
  Filled 2019-01-22: qty 1

## 2019-01-22 MED ORDER — LORAZEPAM 1 MG PO TABS
0.0000 mg | ORAL_TABLET | Freq: Four times a day (QID) | ORAL | Status: DC
  Administered 2019-01-23: 2 mg via ORAL
  Filled 2019-01-22: qty 2

## 2019-01-22 MED ORDER — LORAZEPAM 2 MG/ML IJ SOLN: 0.0000 mg | Freq: Two times a day (BID) | INTRAMUSCULAR | Status: DC

## 2019-01-22 MED ORDER — ONDANSETRON HCL 4 MG PO TABS: 4.0000 mg | ORAL_TABLET | Freq: Three times a day (TID) | ORAL | Status: DC | PRN

## 2019-01-22 MED ORDER — ACETAMINOPHEN 325 MG PO TABS
650.0000 mg | ORAL_TABLET | ORAL | Status: DC | PRN
  Administered 2019-01-23: 650 mg via ORAL
  Filled 2019-01-22: qty 2

## 2019-01-22 MED ORDER — LORAZEPAM 2 MG/ML IJ SOLN: 0.0000 mg | Freq: Four times a day (QID) | INTRAMUSCULAR | Status: DC

## 2019-01-22 NOTE — ED Notes (Signed)
Pt alert and oriented. Pt c/o of abdominal pain and vaginal discharge. Pt c.o of si without a plan. Pt denies hi and avh at this time. Pt contracts to safety. Pt noted with a sitter. Pt safe will continue to monitor.
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
## 2019-01-22 NOTE — ED Provider Notes (Signed)
[facility]-EMERGENCY DEPT Provider Note   CSN: [id number] Arrival date & time: 01/22/19  1520  History   Chief Complaint Chief Complaint  Patient presents with  . Suicidal    HPI Susan [name] is a 43 y.o. female with past medical history significant for alcohol abuse, asthma, BV, chronic pelvic pain, polysubstance abuse, trichomonas, gonorrhea who presents for evaluation of 2 separate complaints.  Patient states she has been suicidal over the last 3 weeks.  States she has been off her psychiatric medication since then.  Patient is to SI without plan.  She also states she hears voices were telling her to harm herself.  She denies wanting to harm others.  Denies visual hallucinations.  She does drink about a fourth of liquor daily.  Last drink just PTA. States she is wanting inpatient treatment at this time for suicide ideations.  States she also has pelvic pain and vaginal discharge. Pelvic pain is chronic and she has had this for "10 years." Vaginal discharge clear in color.  States she is sexually active with female partners.  Does not use protection.  No fever, chills, nausea, vomiting, headache, chest pain, shortness of breath, abdominal pain, diarrhea or dysuria.    History obtained from patient and past medical records.  No interpreter was used.     HPI  Past Medical History:  Diagnosis Date  . Alcohol abuse   . Asthma   . BV (bacterial vaginosis) 03/06/2013  . Chronic dental pain   . Chronic pelvic pain in female   . Depression   . Ectopic pregnancy   . GERD (gastroesophageal reflux disease)   . Panic attacks   . Polysubstance abuse (HCC)    cocaine, opiates, marijuana  . Trichomonas vaginitis     Patient Active Problem List   Diagnosis Date Noted  . PTSD (post-traumatic stress disorder) 06/07/2017  . Substance induced mood disorder (HCC) 06/04/2017  . Preterm labor 04/10/2017  . Asymptomatic bacteriuria during pregnancy in second trimester  03/14/2017  . Late prenatal care in second trimester 03/08/2017  . Supervision of normal pregnancy 03/08/2017  . MDD (major depressive disorder), single episode, severe with psychotic features (HCC) 01/05/2017  . Alcohol use disorder, severe, dependence (HCC) 01/05/2017  . Cocaine use disorder, moderate, dependence (HCC) 01/05/2017  . Cannabis use disorder, moderate, dependence (HCC) 01/05/2017  . Heavy smoker 02/03/2016  . Cocaine abuse affecting pregnancy, antepartum (HCC) 02/03/2016  . Chronic, continuous use of opioids 03/24/2015  . Depression with anxiety 03/24/2015  . Poor dentition 03/24/2015  . Sprain of ankle, unspecified site 05/28/2009    Past Surgical History:  Procedure Laterality Date  . ECTOPIC PREGNANCY SURGERY       OB History    Gravida  9   Para  5   Term  4   Preterm  1   AB  4   Living  5     SAB  1   TAB  2   Ectopic  1   Multiple  0   Live Births  5            Home Medications    Prior to Admission medications   Medication Sig Start Date End Date Taking? Authorizing Provider  amoxicillin (AMOXIL) 500 MG capsule Take 1 capsule (500 mg total) by mouth 3 (three) times daily. Patient not taking: Reported on 01/22/2019 11/06/18   Jacquelin HawkingMcElroy, Shannon, PA-C    Family History Family History  Problem Relation Age of Onset  .  COPD Mother   . Stroke Mother   . Heart disease Maternal Grandmother   . Hypertension Maternal Grandmother   . Stroke Maternal Grandfather   . Asthma Sister   . Down syndrome Sister     Social History Social History   Tobacco Use  . Smoking status: Current Every Day Smoker    Packs/day: 1.00    Years: 26.00    Pack years: 26.00    Types: Cigarettes  . Smokeless tobacco: Never Used  Substance Use Topics  . Alcohol use: Yes    Alcohol/week: 3.0 - 4.0 standard drinks    Types: 3 - 4 Cans of beer per week    Comment: drinks about a 40oz about once a week.   . Drug use: Yes    Types: Marijuana, Cocaine     Comment: last use this month 10-2018.   Allergies   Patient has no known allergies.   Review of Systems Review of Systems  Constitutional: Negative.   HENT: Negative.   Respiratory: Negative.   Cardiovascular: Negative.   Gastrointestinal: Negative.   Genitourinary: Positive for pelvic pain and vaginal discharge. Negative for decreased urine volume, difficulty urinating, dysuria, enuresis, flank pain, frequency, genital sores, hematuria, urgency, vaginal bleeding and vaginal pain.  Musculoskeletal: Negative.   Skin: Negative.   Neurological: Negative.   Psychiatric/Behavioral: Positive for suicidal ideas.  All other systems reviewed and are negative.  Physical Exam Updated Vital Signs BP 109/77 (BP Location: Left Arm)   Pulse (!) 109   Temp 98.7 F (37.1 C) (Oral)   Resp 20   Ht 5\' 1"  (1.549 m)   Wt 77.1 kg   LMP  (LMP Unknown)   SpO2 99%   BMI 32.12 kg/m   Physical Exam  Physical Exam  Constitutional: Pt is oriented to person, place, and time. Pt appears well-developed and well-nourished. No distress.  HENT:  Head: Normocephalic and atraumatic.  Mouth/Throat: Oropharynx is clear and moist.  Eyes: Conjunctivae and EOM are normal. Pupils are equal, round, and reactive to light. No scleral icterus.  No horizontal, vertical or rotational nystagmus  Neck: Normal range of motion. Neck supple.  Full active and passive ROM without pain No midline or paraspinal tenderness No nuchal rigidity or meningeal signs  Cardiovascular: Normal rate, regular rhythm and intact distal pulses.   Pulmonary/Chest: Effort normal and breath sounds normal. No respiratory distress. Pt has no wheezes. No rales.  Abdominal: Soft. Bowel sounds are normal. There is no tenderness. There is no rebound and no guarding. Normal appearing external female genitalia without rashes or lesions, normal vaginal epithelium.Normal appearing cervix without discharge or petechiae. Cervical os is closed. There is no  bleeding noted at the os. No Odor. Bimanual: No CMT, nontender.  No palpable adnexal masses or tenderness. Uterus midline and not fixed. Rectovaginal exam was deferred.  No cystocele or rectocele noted. No pelvic lymphadenopathy noted. Wet prep was obtained.  Cultures for gonorrhea and chlamydia collected. Exam performed with chaperone in room. Musculoskeletal: Normal range of motion.  Lymphadenopathy:    No cervical adenopathy.  Neurological: Pt. is alert and oriented to person, place, and time. He has normal reflexes. No cranial nerve deficit.  Exhibits normal muscle tone. Coordination normal.  Mental Status:  Alert, oriented, thought content appropriate. Speech fluent without evidence of aphasia. Able to follow 2 step commands without difficulty.  Cranial Nerves:  II:  Peripheral visual fields grossly normal, pupils equal, round, reactive to light III,IV, VI: ptosis not present, extra-ocular  motions intact bilaterally  V,VII: smile symmetric, facial light touch sensation equal VIII: hearing grossly normal bilaterally  IX,X: midline uvula rise  XI: bilateral shoulder shrug equal and strong XII: midline tongue extension  Motor:  5/5 in upper and lower extremities bilaterally including strong and equal grip strength and dorsiflexion/plantar flexion Sensory: Pinprick and light touch normal in all extremities.  Deep Tendon Reflexes: 2+ and symmetric  Cerebellar: normal finger-to-nose with bilateral upper extremities Gait: normal gait and balance CV: distal pulses palpable throughout   Skin: Skin is warm and dry. No rash noted. Pt is not diaphoretic.  Psychiatric: Patient with depressed affect.  Endorses SI without plan.  Admits to auditory hallucinations telling her to harm herself. Nursing note and vitals reviewed. ED Treatments / Results  Labs (all labs ordered are listed, but only abnormal results are displayed) Labs Reviewed  COMPREHENSIVE METABOLIC PANEL - Abnormal; Notable for the  following components:      Result Value   Glucose, Bld 133 (*)    All other components within normal limits  RAPID URINE DRUG SCREEN, HOSP PERFORMED - Abnormal; Notable for the following components:   Cocaine POSITIVE (*)    All other components within normal limits  CBC WITH DIFFERENTIAL/PLATELET - Abnormal; Notable for the following components:   WBC 11.8 (*)    All other components within normal limits  URINALYSIS, ROUTINE W REFLEX MICROSCOPIC - Abnormal; Notable for the following components:   APPearance HAZY (*)    Hgb urine dipstick SMALL (*)    Bacteria, UA RARE (*)    All other components within normal limits  ACETAMINOPHEN LEVEL - Abnormal; Notable for the following components:   Acetaminophen (Tylenol), Serum <10 (*)    All other components within normal limits  WET PREP, GENITAL  ETHANOL  SALICYLATE LEVEL  I-STAT BETA HCG BLOOD, ED ([facility], [facility], [facility] ONLY)  GC/CHLAMYDIA PROBE AMP ([place]) NOT AT [facility]    EKG None  Radiology No results found.  Procedures Procedures (including critical care time)  Medications Ordered in ED Medications  LORazepam (ATIVAN) injection 0-4 mg (has no administration in time range)    Or  LORazepam (ATIVAN) tablet 0-4 mg (has no administration in time range)  LORazepam (ATIVAN) injection 0-4 mg (has no administration in time range)    Or  LORazepam (ATIVAN) tablet 0-4 mg (has no administration in time range)  thiamine (VITAMIN B-1) tablet 100 mg (has no administration in time range)    Or  thiamine (B-1) injection 100 mg (has no administration in time range)  acetaminophen (TYLENOL) tablet 650 mg (has no administration in time range)  zolpidem (AMBIEN) tablet 5 mg (has no administration in time range)  ondansetron (ZOFRAN) tablet 4 mg (has no administration in time range)  alum & mag hydroxide-simeth (MAALOX/MYLANTA) 200-200-20 MG/5ML suspension 30 mL (has no administration in time range)  nicotine (NICODERM CQ - dosed in mg/24 hours)  patch 21 mg (has no administration in time range)   Initial Impression / Assessment and Plan / ED Course  I have reviewed the triage vital signs and the nursing notes.  Pertinent labs & imaging results that were available during my care of the patient were reviewed by me and considered in my medical decision making (see chart for details).  43 year old female presents for SI.  Afebrile, nonseptic, non-ill-appearing.  States she does drink 1/5 of liquor a day.  States she last ate just PTA, however her alcohol level is 0.  She denies illicit  drug use however her UDS is positive for cocaine.  Admits to SI without plan.  Denies HI.  States she thinks she hears voices telling her to harm herself.  States these have been occurring x1 month.  States this normally happens when she is off her psychiatric medicines.  She is unable to tell me what she was previously taking or who prescribed this.  States she has been inpatient previously for SI however will not tell me where this was located.  States she also has pelvic pain.  Patient states this is chronic in nature.  She has had this for 10 years.  States she is also had vaginal discharge.  GU exam without vaginal discharge.  She has no CMT or adnexal tenderness.  Her abdomen is soft, nontender without rebound or guarding. Ambulatory in ED and tolerating p.o. intake without difficulty.  Heart and lungs clear.  I have low suspicion for PID, cervicitis, torsion, TOA.  Wet prep negative.  GC, chlamydia obtained.  Reevaluation abdomen soft, nontender without rebound or guarding.  She denies any current abdominal pain. Patient is nontoxic, nonseptic appearing, in no apparent distress. Patient does not meet the SIRS or Sepsis criteria.  On repeat exam patient does not have a surgical abdomin and there are no peritoneal signs.  No indication of appendicitis, bowel obstruction, bowel perforation, cholecystitis, diverticulitis, PID or ectopic pregnancy.   CBC with mild  leukocytosis at 66.0, metabolic panel mild hyperglycemia at 133, no additional electrolyte, renal or liver abnormality.  Pregnancy negative alcohol, salicylate, acetaminophen level negative.  Urinalysis negative.  Patient does not appear in active alcohol withdrawal.  Patient has been medically cleared.  Will place consult for TTS. Patient is not under IVC at this time.      Final Clinical Impressions(s) / ED Diagnoses   Final diagnoses:  Suicidal ideation  Chronic pelvic pain in female  Vaginal discharge    ED Discharge Orders    None       Henderly, Britni A, PA-C 01/22/19 2356    Dorie Rank, MD 01/23/19 1630

## 2019-01-23 ENCOUNTER — Encounter (HOSPITAL_COMMUNITY): Payer: Self-pay

## 2019-01-23 ENCOUNTER — Encounter (HOSPITAL_COMMUNITY): Payer: Self-pay | Admitting: Registered Nurse

## 2019-01-23 ENCOUNTER — Inpatient Hospital Stay (HOSPITAL_COMMUNITY)
Admission: AD | Admit: 2019-01-23 | Discharge: 2019-01-26 | DRG: 885 | Disposition: A | Discharge status: Home / Self Care | Payer: Federal, State, Local not specified - Other | Source: Intra-hospital | Attending: Psychiatry | Admitting: Psychiatry

## 2019-01-23 ENCOUNTER — Other Ambulatory Visit: Payer: Self-pay

## 2019-01-23 DIAGNOSIS — K219 Gastro-esophageal reflux disease without esophagitis: Secondary | ICD-10-CM | POA: Diagnosis present

## 2019-01-23 DIAGNOSIS — R45851 Suicidal ideations: Secondary | ICD-10-CM | POA: Diagnosis present

## 2019-01-23 DIAGNOSIS — N739 Female pelvic inflammatory disease, unspecified: Secondary | ICD-10-CM | POA: Diagnosis present

## 2019-01-23 DIAGNOSIS — Z9119 Patient's noncompliance with other medical treatment and regimen: Secondary | ICD-10-CM | POA: Diagnosis not present

## 2019-01-23 DIAGNOSIS — F1010 Alcohol abuse, uncomplicated: Secondary | ICD-10-CM | POA: Diagnosis present

## 2019-01-23 DIAGNOSIS — Z825 Family history of asthma and other chronic lower respiratory diseases: Secondary | ICD-10-CM | POA: Diagnosis not present

## 2019-01-23 DIAGNOSIS — F4310 Post-traumatic stress disorder, unspecified: Secondary | ICD-10-CM | POA: Diagnosis present

## 2019-01-23 DIAGNOSIS — Z62810 Personal history of physical and sexual abuse in childhood: Secondary | ICD-10-CM | POA: Diagnosis present

## 2019-01-23 DIAGNOSIS — Z8279 Family history of other congenital malformations, deformations and chromosomal abnormalities: Secondary | ICD-10-CM | POA: Diagnosis not present

## 2019-01-23 DIAGNOSIS — F1410 Cocaine abuse, uncomplicated: Secondary | ICD-10-CM | POA: Diagnosis present

## 2019-01-23 DIAGNOSIS — Z8249 Family history of ischemic heart disease and other diseases of the circulatory system: Secondary | ICD-10-CM

## 2019-01-23 DIAGNOSIS — F323 Major depressive disorder, single episode, severe with psychotic features: Principal | ICD-10-CM | POA: Diagnosis present

## 2019-01-23 DIAGNOSIS — Z823 Family history of stroke: Secondary | ICD-10-CM | POA: Diagnosis not present

## 2019-01-23 DIAGNOSIS — G4700 Insomnia, unspecified: Secondary | ICD-10-CM | POA: Diagnosis present

## 2019-01-23 DIAGNOSIS — F332 Major depressive disorder, recurrent severe without psychotic features: Secondary | ICD-10-CM | POA: Diagnosis present

## 2019-01-23 LAB — LIPID PANEL
Cholesterol: 128 mg/dL (ref 0–200)
HDL: 40 mg/dL — ABNORMAL LOW (ref >40)
LDL Cholesterol: 25 mg/dL (ref 0–99)
Total CHOL/HDL Ratio: 3.2 RATIO
Triglycerides: 317 mg/dL — ABNORMAL HIGH (ref <150)
VLDL: 63 mg/dL — ABNORMAL HIGH (ref 0–40)

## 2019-01-23 LAB — TSH: TSH: 1.973 u[IU]/mL (ref 0.350–4.500)

## 2019-01-23 MED ORDER — GABAPENTIN 300 MG PO CAPS
300.0000 mg | ORAL_CAPSULE | Freq: Three times a day (TID) | ORAL | Status: DC
  Administered 2019-01-23 – 2019-01-24 (×2): 300 mg via ORAL
  Filled 2019-01-23 (×5): qty 1

## 2019-01-23 MED ORDER — NICOTINE 21 MG/24HR TD PT24
21.0000 mg | MEDICATED_PATCH | Freq: Every day | TRANSDERMAL | Status: DC
  Administered 2019-01-24: 21 mg via TRANSDERMAL
  Filled 2019-01-23 (×3): qty 1

## 2019-01-23 MED ORDER — DOXYCYCLINE HYCLATE 100 MG PO TABS
100.0000 mg | ORAL_TABLET | Freq: Two times a day (BID) | ORAL | Status: DC
  Administered 2019-01-23 – 2019-01-26 (×6): 100 mg via ORAL
  Filled 2019-01-23 (×8): qty 1

## 2019-01-23 MED ORDER — BACITRACIN-NEOMYCIN-POLYMYXIN OINTMENT TUBE
TOPICAL_OINTMENT | Freq: Two times a day (BID) | CUTANEOUS | Status: DC
  Administered 2019-01-23 – 2019-01-24 (×3): via TOPICAL
  Filled 2019-01-23 (×2): qty 14.17

## 2019-01-23 MED ORDER — FLUOXETINE HCL 20 MG PO CAPS
20.0000 mg | ORAL_CAPSULE | Freq: Every day | ORAL | Status: DC
  Administered 2019-01-23 – 2019-01-26 (×4): 20 mg via ORAL
  Filled 2019-01-23 (×5): qty 1

## 2019-01-23 MED ORDER — METHOCARBAMOL 750 MG PO TABS
750.0000 mg | ORAL_TABLET | Freq: Three times a day (TID) | ORAL | Status: AC
  Administered 2019-01-23 – 2019-01-24 (×2): 750 mg via ORAL
  Filled 2019-01-23 (×3): qty 1

## 2019-01-23 MED ORDER — ONDANSETRON 4 MG PO TBDP
4.0000 mg | ORAL_TABLET | Freq: Four times a day (QID) | ORAL | Status: DC | PRN
  Administered 2019-01-24: 4 mg via ORAL
  Filled 2019-01-23: qty 1

## 2019-01-23 MED ORDER — LOPERAMIDE HCL 2 MG PO CAPS: 2.0000 mg | ORAL_CAPSULE | ORAL | Status: DC | PRN

## 2019-01-23 MED ORDER — HYDROXYZINE HCL 25 MG PO TABS
25.0000 mg | ORAL_TABLET | Freq: Three times a day (TID) | ORAL | Status: DC | PRN
  Administered 2019-01-23 – 2019-01-25 (×5): 25 mg via ORAL
  Filled 2019-01-23 (×4): qty 1

## 2019-01-23 MED ORDER — TRAZODONE HCL 100 MG PO TABS
200.0000 mg | ORAL_TABLET | Freq: Every day | ORAL | Status: DC
  Administered 2019-01-23 – 2019-01-25 (×3): 200 mg via ORAL
  Filled 2019-01-23 (×4): qty 2

## 2019-01-23 MED ORDER — RISPERIDONE 1 MG PO TABS
1.0000 mg | ORAL_TABLET | Freq: Two times a day (BID) | ORAL | Status: DC
  Administered 2019-01-23 – 2019-01-26 (×6): 1 mg via ORAL
  Filled 2019-01-23 (×9): qty 1

## 2019-01-23 MED ORDER — PRENATAL MULTIVITAMIN CH
1.0000 | ORAL_TABLET | Freq: Every day | ORAL | Status: DC
  Administered 2019-01-23 – 2019-01-26 (×4): 1 via ORAL
  Filled 2019-01-23 (×5): qty 1

## 2019-01-23 MED ORDER — DICYCLOMINE HCL 20 MG PO TABS
20.0000 mg | ORAL_TABLET | Freq: Four times a day (QID) | ORAL | Status: DC | PRN
  Administered 2019-01-23: 20 mg via ORAL
  Filled 2019-01-23: qty 1

## 2019-01-23 NOTE — ED Notes (Signed)
Patient woke up to eat her breakfast then went back to sleep.  Did not interact with staff.   Sitter at bedside.

## 2019-01-23 NOTE — [facility] Group Notes (Signed)
[facility] Group Therapy 01/23/2019 2:59 PM  Type of Therapy: [facility] [employer] Presentation  Participation Level: Active  Participation Quality: Attentive  Affect: Appropriate  Cognitive: Oriented  Insight: Developing/Improving  Engagement in Therapy: Engaged  Modes of Intervention: Discussion, Education and Socialization   Summary of Progress/Problems: Mental [employer] Association (MHA) Speaker came to talk about his personal journey with mental health. The pt processed ways by which to relate to the speaker. MHA speaker provided handouts and educational information pertaining to groups and services offered by the MHA. Pt was engaged in speaker's presentation and was receptive to resources provided.   [name], MSW, LCSWA 01/23/2019 2:59 PM 

## 2019-01-23 NOTE — ED Notes (Signed)
[facility] transport here to take patient over to [facility]. Pt had two belonging bags given to [employer] driver.

## 2019-01-23 NOTE — ED Notes (Signed)
Report called to [facility] RN.  Patient still in shower but will call [facility] shortly.

## 2019-01-23 NOTE — Discharge Summary (Addendum)
  Patient to be transferred to [place] for inpatient psychiatric treatment  Susan B. Rankin, NP  Patient's chart reviewed. Reviewed the information documented and agree with the treatment plan.  Buford Dresser, DO 01/23/19 3:44 PM

## 2019-01-23 NOTE — ED Notes (Signed)
Patient taking a bath.

## 2019-01-23 NOTE — Tx Team (Signed)
Initial Treatment Plan 01/23/2019 2:51 PM Susan [name] Susan [name]    PATIENT STRESSORS: Substance abuse   PATIENT STRENGTHS: Ability for insight Average or above average intelligence   PATIENT IDENTIFIED PROBLEMS: "I need to get back on some medications."  Substance abuse                   DISCHARGE CRITERIA:  Ability to meet basic life and health needs Adequate post-discharge living arrangements Improved stabilization in mood, thinking, and/or behavior  PRELIMINARY DISCHARGE PLAN: Outpatient therapy  PATIENT/FAMILY INVOLVEMENT: This treatment plan has been presented to and reviewed with the patient, Susan [name]. The patient and family have been given the opportunity to ask questions and make suggestions.  Megan Mans, RN 01/23/2019, 2:51 PM

## 2019-01-23 NOTE — BH Assessment (Signed)
Tele Assessment Note   Patient Name: Susan [name] MRN: [id number] Referring Physician: Morton Amy, PA-C Location of Patient: [facility] Location of Provider: Behavioral Health TTS Department  Susan [name] is an 43 y.o. female presenting with SI with no plan and hallucinations. Patient reported seeing different people and hearing command voices telling her to harm herself and others, patient shared no plan. Patient reported being suicidal over the last 3 weeks. Patient reported being off her psychiatric medication since then. Patient reports increased depression and that she is not taking her medications. Patient reported stressors are life and money. Patient denied receiving any mental health outpatient services. Patient denied past suicide attempts. Patient admitted to self-harming behaviors of cutting wrist 1 year ago, no recent behaviors. Patient reported drinking about a fourth of liquor daily.  Last drink just PTA. States she is wanting inpatient treatment at this time for suicide ideations.  Collateral Contact: [name], daughter, [phone]  UDS +cocaine BAL negative  Diagnosis: Major depressive disorder  Past Medical History:  Past Medical History:  Diagnosis Date  . Alcohol abuse   . Asthma   . BV (bacterial vaginosis) 03/06/2013  . Chronic dental pain   . Chronic pelvic pain in female   . Depression   . Ectopic pregnancy   . GERD (gastroesophageal reflux disease)   . Panic attacks   . Polysubstance abuse (HCC)    cocaine, opiates, marijuana  . Trichomonas vaginitis     Past Surgical History:  Procedure Laterality Date  . ECTOPIC PREGNANCY SURGERY      Family History:  Family History  Problem Relation Age of Onset  . COPD Mother   . Stroke Mother   . Heart disease Maternal Grandmother   . Hypertension Maternal Grandmother   . Stroke Maternal Grandfather   . Asthma Sister   . Down syndrome Sister     Social History:  reports that she has been smoking  cigarettes. She has a 26.00 pack-year smoking history. She has never used smokeless tobacco. She reports current alcohol use of about 3.0 - 4.0 standard drinks of alcohol per week. She reports current drug use. Drugs: Marijuana and Cocaine.  Additional Social History:     CIWA: CIWA-Ar BP: 118/76 Pulse Rate: 84 Nausea and Vomiting: no nausea and no vomiting Tactile Disturbances: none Tremor: no tremor Auditory Disturbances: moderate harshness or ability to frighten Paroxysmal Sweats: no sweat visible Visual Disturbances: not present Anxiety: moderately anxious, or guarded, so anxiety is inferred Headache, Fullness in Head: moderate Agitation: two Orientation and Clouding of Sensorium: oriented and can do serial additions CIWA-Ar Total: 12 COWS:    Allergies: No Known Allergies  Home Medications: (Not in a hospital admission)   OB/GYN Status:  No LMP recorded (lmp unknown).  General Assessment Data Location of Assessment: [facility] TTS Assessment: In system Is this a Tele or Face-to-Face Assessment?: Tele Assessment Is this an Initial Assessment or a Re-assessment for this encounter?: Initial Assessment Patient Accompanied by:: N/A Language Other than English: No Living Arrangements: (family home) What gender do you identify as?: Female Marital status: Single Living Arrangements: Parent Can pt return to current living arrangement?: Yes Admission Status: Voluntary Is patient capable of signing voluntary admission?: Yes Referral Source: Self/Family/Friend     Crisis Care Plan Living Arrangements: Parent Legal Guardian: (self) Name of Psychiatrist: (none) Name of Therapist: (none)  Education Status Is patient currently in school?: No Is the patient employed, unemployed or receiving disability?: Receiving disability income  Risk to  self with the past 6 months Suicidal Ideation: Yes-Currently Present Has patient been a risk to self within the past 6 months prior to  admission? : No Suicidal Intent: Yes-Currently Present Has patient had any suicidal intent within the past 6 months prior to admission? : No Is patient at risk for suicide?: Yes Suicidal Plan?: No Has patient had any suicidal plan within the past 6 months prior to admission? : No Access to Means: No What has been your use of drugs/alcohol within the last 12 months?: (none reported) Previous Attempts/Gestures: No How many times?: (n/a) Triggers for Past Attempts: (denied) Intentional Self Injurious Behavior: Cutting Comment - Self Injurious Behavior: (hx of cutting) Family Suicide History: No Recent stressful life event(s): Financial Problems(life and money) Persecutory voices/beliefs?: No Depression: Yes Depression Symptoms: Fatigue, Loss of interest in usual pleasures, Feeling worthless/self pity, Tearfulness Substance abuse history and/or treatment for substance abuse?: No Suicide prevention information given to non-admitted patients: Not applicable  Risk to Others within the past 6 months Homicidal Ideation: No Does patient have any lifetime risk of violence toward others beyond the six months prior to admission? : No Thoughts of Harm to Others: No Current Homicidal Intent: No Current Homicidal Plan: No Access to Homicidal Means: No Identified Victim: (n/a) History of harm to others?: No Assessment of Violence: None Noted Violent Behavior Description: (none reported) Does patient have access to weapons?: No Criminal Charges Pending?: No Does patient have a court date: No Is patient on probation?: No  Psychosis Hallucinations: Visual, Auditory Delusions: None noted  Mental Status Report Appearance/Hygiene: Unremarkable Eye Contact: Unable to Assess Motor Activity: Unable to assess Speech: Logical/coherent Level of Consciousness: Alert Mood: Depressed Affect: Depressed Anxiety Level: Minimal Thought Processes: Coherent Judgement: Partial Orientation: Person,  Place, Time, Situation Obsessive Compulsive Thoughts/Behaviors: None  Cognitive Functioning Concentration: Fair Memory: Recent Impaired Is patient IDD: No Insight: Poor Impulse Control: Poor Appetite: Fair Have you had any weight changes? : No Change Sleep: Decreased Total Hours of Sleep: (unknown) Vegetative Symptoms: None  ADLScreening [facility] Assessment Services) Patient's cognitive ability adequate to safely complete daily activities?: Yes Patient able to express need for assistance with ADLs?: Yes Independently performs ADLs?: Yes (appropriate for developmental age)  Prior Inpatient Therapy Prior Inpatient Therapy: Yes Prior Therapy Dates: (2017) [facility] Reason for Treatment: (hallucinations)  Prior Outpatient Therapy Prior Outpatient Therapy: Yes Prior Therapy Dates: (unknown) Prior Therapy Facilty/Provider(s): (unknown) Reason for Treatment: (medication management) Does patient have an ACCT team?: No Does patient have Intensive In-House Services?  : No Does patient have [facility] services? : No Does patient have P4CC services?: No  ADL Screening (condition at time of admission) Patient's cognitive ability adequate to safely complete daily activities?: Yes Patient able to express need for assistance with ADLs?: Yes Independently performs ADLs?: Yes (appropriate for developmental age)   Disposition:  Disposition Initial Assessment Completed for this Encounter: Yes  Nira ConnJason Berry, NP, patient meets inpatient criteria. AC, no appropriate beds. TTS to secure placement. Melina Schoolsobyn, RN, informed of disposition.  This service was provided via telemedicine using a 2-way, interactive audio and video technology.  Names of all persons participating in this telemedicine service and their role in this encounter. Name: Susan [name] Role: Patient  Name: Al CorpusLatisha Maral Lampe Role: TTS Clinician  Name:  Role:   Name:  Role:     Susan [name] 01/23/2019 3:17 AM
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
## 2019-01-23 NOTE — BH Assessment (Signed)
[facility] Assessment Progress Note  Per Buford Dresser, DO, this pt requires psychiatric hospitalization at this time.  Letitia Libra, RN, [facility] has assigned pt to [facility] Rm [id number].  Pt has signed Voluntary Admission and Consent for Treatment, as well as Consent to Release Information to pt's mother and her boyfriend, and signed forms have been faxed to [facility]  Pt's nurse, Caren Griffins, has been notified, and agrees to send original paperwork along with pt via [facility], and to call report to [phone].  Jalene Mullet, [facility] Coordinator [phone]

## 2019-01-23 NOTE — [facility] Suicide Risk Assessment (Signed)
[facility] Admission Suicide Risk Assessment     Total Time spent with patient: 45 minutes Principal Problem: MDD Diagnosis:  Active Problems:   MDD (major depressive disorder), single episode, severe with psychotic features ([place])  Subjective Data: c/o aud halluc- severe depression  Continued Clinical Symptoms:    The "Alcohol Use Disorders Identification Test", Guidelines for Use in Primary Care, Second Edition.  World Pharmac[employer] [facility]). Score between 0-7:  no or low risk or alcohol related problems. Score between 8-15:  moderate risk of alcohol related problems. Score between 16-19:  high risk of alcohol related problems. Score 20 or above:  warrants further diagnostic evaluation for alcohol dependence and treatment.   CLINICAL FACTORS:   Depression:   Hopelessness   Musculoskeletal: Strength & Muscle Tone: within normal limits Gait & Station: normal Patient leans: N/A  Psychiatric Specialty Exam: Physical Exam  Constitutional: She appears well-developed and well-nourished.  Cardiovascular: Normal rate.    Review of Systems  Constitutional: Negative.   HENT: Negative.   Cardiovascular: Negative.   Skin: Negative.   Neurological: Negative.   Endo/Heme/Allergies: Negative.     unknown if currently breastfeeding.There is no height or weight on file to calculate BMI.  General Appearance: Casual  Eye Contact:  Good  Speech:  Normal Rate  Volume:  Normal  Mood:  Depressed  Affect:  Tearful  Thought Process:  Linear and Descriptions of Associations: Intact  Orientation:  Full (Time, Place, and Person)  Thought Content:  Logical and Hallucinations: Auditory  Suicidal Thoughts:  Yes - no plans here -  Homicidal Thoughts:  No  Memory:  Immediate;   Good  Judgement:  Good  Insight:  Good  Psychomotor Activity:  Normal  Concentration:  Concentration: Good  Recall:  Good  Fund of Knowledge:  Good  Language:  Good  Akathisia:  Negative  Handed:  Right  AIMS (if  indicated):     Assets:  Communication Skills Desire for Improvement Housing Leisure Time Physical Health Resilience  ADL's:  Intact  Cognition:  intact  Sleep:   poor      COGNITIVE FEATURES THAT CONTRIBUTE TO RISK:  None    SUICIDE RISK:   Moderate:  Frequent suicidal ideation with limited intensity, and duration, some specificity in terms of plans, no associated intent, good self-control, limited dysphoria/symptomatology, some risk factors present, and identifiable protective factors, including available and accessible social support.  PLAN OF CARE: see eval-  I certify that inpatient services furnished can reasonably be expected to improve the patient's condition.   Johnn Hai, MD 01/23/2019, 2:35 PM

## 2019-01-23 NOTE — Progress Notes (Signed)
Patient ID: Susan [name], female   DOB: 1975/12/24, 43 y.o.   MRN: [id number]   Patient is a 43 yo female admitted after stating she was suicidal with no plan. According to collateral "Patient reported seeing different people and hearing command voices telling her to harm herself and others, patient shared no plan. Patient reported being suicidal over the last 3 weeks. Patient reported being off her psychiatric medication since then. Patient reports increased depression and that she is not taking her medications. Patient reported stressors are life and money. Patient denied receiving any mental health outpatient services. Patient denied past suicide attempts. Patient admitted to self-harming behaviors of cutting wrist 1 year ago, no recent behaviors. Patient reported drinking about a fourth of liquor daily. Last drink just PTA."

## 2019-01-23 NOTE — ED Notes (Signed)
Made [facility] aware of transport needed over to [facility].

## 2019-01-24 MED ORDER — NICOTINE POLACRILEX 2 MG MT GUM
CHEWING_GUM | OROMUCOSAL | Status: AC
  Administered 2019-01-24: 2 mg via ORAL
  Filled 2019-01-24: qty 1

## 2019-01-24 MED ORDER — KETOROLAC TROMETHAMINE 60 MG/2ML IM SOLN
60.0000 mg | Freq: Once | INTRAMUSCULAR | Status: AC
  Administered 2019-01-24: 60 mg via INTRAMUSCULAR
  Filled 2019-01-24: qty 2

## 2019-01-24 MED ORDER — GABAPENTIN 400 MG PO CAPS
400.0000 mg | ORAL_CAPSULE | Freq: Three times a day (TID) | ORAL | Status: DC
  Administered 2019-01-24 – 2019-01-26 (×6): 400 mg via ORAL
  Filled 2019-01-24 (×9): qty 1

## 2019-01-24 MED ORDER — NICOTINE POLACRILEX 2 MG MT GUM
2.0000 mg | CHEWING_GUM | OROMUCOSAL | Status: DC | PRN
  Administered 2019-01-24 – 2019-01-26 (×4): 2 mg via ORAL
  Filled 2019-01-24: qty 1

## 2019-01-24 NOTE — Progress Notes (Signed)
[place] NOVEL CORONAVIRUS (COVID-19) DAILY CHECK-OFF SYMPTOMS - answer yes or no to each - every day NO YES  Have you had a fever in the past 24 hours?  . Fever (Temp > 37.80C / 100F) X   Have you had any of these symptoms in the past 24 hours? . New Cough .  Sore Throat  .  Shortness of Breath .  Difficulty Breathing .  Unexplained Body Aches   X   Have you had any one of these symptoms in the past 24 hours not related to allergies?   . Runny Nose .  Nasal Congestion .  Sneezing   X   If you have had runny nose, nasal congestion, sneezing in the past 24 hours, has it worsened?  X   EXPOSURES - check yes or no X   Have you traveled outside the state in the past 14 days?  X   Have you been in contact with someone with a confirmed diagnosis of COVID-19 or PUI in the past 14 days without wearing appropriate PPE?  X   Have you been living in the same home as a person with confirmed diagnosis of COVID-19 or a PUI (household contact)?    X   Have you been diagnosed with COVID-19?    X              What to do next: Answered NO to all: Answered YES to anything:   Proceed with unit schedule Follow the [facility] Inpatient Flowsheet.   

## 2019-01-24 NOTE — Tx Team (Signed)
Interdisciplinary Treatment and Diagnostic Plan Update  01/24/2019 Time of Session: Susan [name] MRN: [id number]  Principal Diagnosis: <principal problem not specified>  Secondary Diagnoses: Active Problems:   MDD (major depressive disorder), single episode, severe with psychotic features ([place])   MDD (major depressive disorder), recurrent episode, severe ([place])   Current Medications:  Current Facility-Administered Medications  Medication Dose Route Frequency Provider Last Rate Last Dose  . dicyclomine (BENTYL) tablet 20 mg  20 mg Oral Q6H PRN Johnn Hai, MD   20 mg at 01/23/19 1708  . doxycycline (VIBRA-TABS) tablet 100 mg  100 mg Oral Q12H Johnn Hai, MD   100 mg at 01/24/19 0806  . FLUoxetine (PROZAC) capsule 20 mg  20 mg Oral Daily Johnn Hai, MD   20 mg at 01/24/19 0806  . gabapentin (NEURONTIN) capsule 400 mg  400 mg Oral TID Johnn Hai, MD      . hydrOXYzine (ATARAX/VISTARIL) tablet 25 mg  25 mg Oral TID PRN Johnn Hai, MD   25 mg at 01/23/19 2030  . ketorolac (TORADOL) injection 60 mg  60 mg Intramuscular Once Johnn Hai, MD      . loperamide (IMODIUM) capsule 2-4 mg  2-4 mg Oral PRN Johnn Hai, MD      . neomycin-bacitracin-polymyxin (NEOSPORIN) ointment   Topical BID Sharma Covert, MD      . nicotine polacrilex (NICORETTE) gum 2 mg  2 mg Oral PRN Sharma Covert, MD   2 mg at 01/24/19 0820  . ondansetron (ZOFRAN-ODT) disintegrating tablet 4 mg  4 mg Oral Q6H PRN Johnn Hai, MD      . prenatal multivitamin tablet 1 tablet  1 tablet Oral Daily Johnn Hai, MD   1 tablet at 01/24/19 [id number]  . risperiDONE (RISPERDAL) tablet 1 mg  1 mg Oral BID Johnn Hai, MD   1 mg at 01/24/19 0806  . traZODone (DESYREL) tablet 200 mg  200 mg Oral QHS Johnn Hai, MD   200 mg at 01/23/19 2028   PTA Medications: Medications Prior to Admission  Medication Sig Dispense Refill Last Dose  . amoxicillin (AMOXIL) 500 MG capsule Take 1 capsule (500 mg total) by mouth 3  (three) times daily. (Patient not taking: Reported on 01/22/2019) 30 capsule 0     Patient Stressors: Substance abuse  Patient Strengths: Ability for insight Average or above average intelligence  Treatment Modalities: Medication Management, Group therapy, Case management,  1 to 1 session with clinician, Psychoeducation, Recreational therapy.   Physician Treatment Plan for Primary Diagnosis: <principal problem not specified> Long Term Goal(s): Improvement in symptoms so as ready for discharge Improvement in symptoms so as ready for discharge   Short Term Goals: Ability to demonstrate self-control will improve Ability to identify and develop effective coping behaviors will improve Ability to maintain clinical measurements within normal limits will improve Ability to disclose and discuss suicidal ideas Ability to demonstrate self-control will improve Ability to maintain clinical measurements within normal limits will improve  Medication Management: Evaluate patient's response, side effects, and tolerance of medication regimen.  Therapeutic Interventions: 1 to 1 sessions, Unit Group sessions and Medication administration.  Evaluation of Outcomes: Not Met  Physician Treatment Plan for Secondary Diagnosis: Active Problems:   MDD (major depressive disorder), single episode, severe with psychotic features ([place])   MDD (major depressive disorder), recurrent episode, severe ([place])  Long Term Goal(s): Improvement in symptoms so as ready for discharge Improvement in symptoms so as ready for discharge   Short Term  Goals: Ability to demonstrate self-control will improve Ability to identify and develop effective coping behaviors will improve Ability to maintain clinical measurements within normal limits will improve Ability to disclose and discuss suicidal ideas Ability to demonstrate self-control will improve Ability to maintain clinical measurements within normal limits will improve      Medication Management: Evaluate patient's response, side effects, and tolerance of medication regimen.  Therapeutic Interventions: 1 to 1 sessions, Unit Group sessions and Medication administration.  Evaluation of Outcomes: Not Met   RN Treatment Plan for Primary Diagnosis: <principal problem not specified> Long Term Goal(s): Knowledge of disease and therapeutic regimen to maintain health will improve  Short Term Goals: Ability to identify and develop effective coping behaviors will improve and Compliance with prescribed medications will improve  Medication Management: RN will administer medications as ordered by provider, will assess and evaluate patient's response and provide education to patient for prescribed medication. RN will report any adverse and/or side effects to prescribing provider.  Therapeutic Interventions: 1 on 1 counseling sessions, Psychoeducation, Medication administration, Evaluate responses to treatment, Monitor vital signs and CBGs as ordered, Perform/monitor CIWA, COWS, AIMS and Fall Risk screenings as ordered, Perform wound care treatments as ordered.  Evaluation of Outcomes: Not Met   LCSW Treatment Plan for Primary Diagnosis: <principal problem not specified> Long Term Goal(s): Safe transition to appropriate next level of care at discharge, Engage patient in therapeutic group addressing interpersonal concerns.  Short Term Goals: Engage patient in aftercare planning with referrals and resources, Increase social support and Increase skills for wellness and recovery  Therapeutic Interventions: Assess for all discharge needs, 1 to 1 time with Social worker, Explore available resources and support systems, Assess for adequacy in community support network, Educate family and significant other(s) on suicide prevention, Complete Psychosocial Assessment, Interpersonal group therapy.  Evaluation of Outcomes: Not Met   Progress in Treatment: Attending groups:  Yes. Participating in groups: Yes. Taking medication as prescribed: Yes. Toleration medication: Yes. Family/Significant other contact made: No, will contact:  when given permission Patient understands diagnosis: Yes. Discussing patient identified problems/goals with staff: Yes. Medical problems stabilized or resolved: Yes. Denies suicidal/homicidal ideation: Yes. Issues/concerns per patient self-inventory: No. Other: none  New problem(s) identified: No, Describe:  none  New Short Term/Long Term Goal(s):  Patient Goals:  Get on meds, get follow up care arranged.  Discharge Plan or Barriers:   Reason for Continuation of Hospitalization: Depression Hallucinations Medication stabilization  Estimated Length of Stay: 3-5 days    Attendees: Patient: 01/24/2019 10:13 AM  Physician: Dr. Mallie Darting, MD 01/24/2019 10:13 AM  Nursing: Baldo Daub, RN 01/24/2019 10:13 AM  RN Care Manager: 01/24/2019 10:13 AM  Social Worker: Lurline Idol, LCSW 01/24/2019 10:13 AM  Recreational Therapist:  01/24/2019 10:13 AM  Other:  01/24/2019 10:13 AM  Other:  01/24/2019 10:13 AM  Other: 01/24/2019 10:13 AM    Scribe for Treatment Team: Joanne Chars, LCSW 01/24/2019 10:13 AM

## 2019-01-24 NOTE — Progress Notes (Signed)
Recreation Therapy Notes  Date:  6.17.20 Time: 0930 Location: [address] Dayroom  Group Topic: Stress Management  Goal Area(s) Addresses:  Patient will identify positive stress management techniques. Patient will identify benefits of using stress management post d/c.  Intervention:  Stress Management  Activity :  Meditation.  LRT introduced the stress management technique of meditation.  LRT played a meditation on letting go of the past and focusing on the present.  Patients were to follow along as the meditation was played to engage in activity.    Education:  Stress Management, Discharge Planning.   Education Outcome: Acknowledges Education  Clinical Observations/Feedback:  Pt did not attend group.     Victorino Sparrow, LRT/CTRS        Ria Comment, Charmian Forbis A 01/24/2019 11:21 AM

## 2019-01-24 NOTE — Progress Notes (Signed)
[facility] MD Progress Note  01/24/2019 8:08 AM Susan [name]  MRN:  [id number] Subjective:   Patient reports reduced abdominal pain/but reports escalation in back pain would like her gabapentin increased.  She is alert and oriented not tearful today, complaining of continued dysphoria but no withdrawal symptoms.  Denies wanting to harm self contracts here only.  No acute auditory or visual hallucinations. Principal Problem: Depression/history of PTSD symptoms/complicated by cocaine abuse and alcohol abuse and recent noncompliance with psychotropic medications Diagnosis: Active Problems:   MDD (major depressive disorder), single episode, severe with psychotic features ([place])   MDD (major depressive disorder), recurrent episode, severe ([place])  Total Time spent with patient: 20 minutes  Past Psychiatric History: see eval  Past Medical History:  Past Medical History:  Diagnosis Date  . Alcohol abuse   . Asthma   . BV (bacterial vaginosis) 03/06/2013  . Chronic dental pain   . Chronic pelvic pain in female   . Depression   . Ectopic pregnancy   . GERD (gastroesophageal reflux disease)   . Panic attacks   . Polysubstance abuse (HCC)    cocaine, opiates, marijuana  . Trichomonas vaginitis     Past Surgical History:  Procedure Laterality Date  . ECTOPIC PREGNANCY SURGERY     Family History:  Family History  Problem Relation Age of Onset  . COPD Mother   . Stroke Mother   . Heart disease Maternal Grandmother   . Hypertension Maternal Grandmother   . Stroke Maternal Grandfather   . Asthma Sister   . Down syndrome Sister    Family Psychiatric  History: see eval Social History:  Social History   Substance and Sexual Activity  Alcohol Use Yes  . Alcohol/week: 3.0 - 4.0 standard drinks  . Types: 3 - 4 Cans of beer per week   Comment: drinks about a 40oz about once a week.      Social History   Substance and Sexual Activity  Drug Use Yes  . Types: Marijuana, Cocaine   Comment:  last use this month 10-2018.    Social History   Socioeconomic History  . Marital status: Single    Spouse name: Not on file  . Number of children: Not on file  . Years of education: Not on file  . Highest education level: Not on file  Occupational History  . Not on file  Social Needs  . Financial resource strain: Not on file  . Food insecurity    Worry: Not on file    Inability: Not on file  . Transportation needs    Medical: Not on file    Non-medical: Not on file  Tobacco Use  . Smoking status: Current Every Day Smoker    Packs/day: 1.00    Years: 26.00    Pack years: 26.00    Types: Cigarettes  . Smokeless tobacco: Never Used  Substance and Sexual Activity  . Alcohol use: Yes    Alcohol/week: 3.0 - 4.0 standard drinks    Types: 3 - 4 Cans of beer per week    Comment: drinks about a 40oz about once a week.   . Drug use: Yes    Types: Marijuana, Cocaine    Comment: last use this month 10-2018.  Marland Kitchen Sexual activity: Yes    Birth control/protection: None  Lifestyle  . Physical activity    Days per week: Not on file    Minutes per session: Not on file  . Stress: Not on file  Relationships  . Social Musicianconnections    Talks on phone: Not on file    Gets together: Not on file    Attends religious service: Not on file    Active member of club or organization: Not on file    Attends meetings of clubs or organizations: Not on file    Relationship status: Not on file  Other Topics Concern  . Not on file  Social History Narrative  . Not on file   Additional Social History:                         Sleep: Fair  Appetite:  Fair  Current Medications: Current Facility-Administered Medications  Medication Dose Route Frequency Provider Last Rate Last Dose  . dicyclomine (BENTYL) tablet 20 mg  20 mg Oral Q6H PRN Malvin JohnsFarah, Kaysey Berndt, MD   20 mg at 01/23/19 1708  . doxycycline (VIBRA-TABS) tablet 100 mg  100 mg Oral Q12H Malvin JohnsFarah, Govanni Plemons, MD   100 mg at 01/24/19 0806  .  FLUoxetine (PROZAC) capsule 20 mg  20 mg Oral Daily Malvin JohnsFarah, Sebastion Jun, MD   20 mg at 01/24/19 0806  . gabapentin (NEURONTIN) capsule 400 mg  400 mg Oral TID Malvin JohnsFarah, Creg Gilmer, MD      . hydrOXYzine (ATARAX/VISTARIL) tablet 25 mg  25 mg Oral TID PRN Malvin JohnsFarah, Shamel Galyean, MD   25 mg at 01/23/19 2030  . ketorolac (TORADOL) injection 60 mg  60 mg Intramuscular Once Malvin JohnsFarah, Hally Colella, MD      . loperamide (IMODIUM) capsule 2-4 mg  2-4 mg Oral PRN Malvin JohnsFarah, Katrin Grabel, MD      . neomycin-bacitracin-polymyxin (NEOSPORIN) ointment   Topical BID Antonieta Pertlary, Greg Lawson, MD      . nicotine (NICODERM CQ - dosed in mg/24 hours) patch 21 mg  21 mg Transdermal Daily Malvin JohnsFarah, Karsten Howry, MD   21 mg at 01/24/19 0806  . ondansetron (ZOFRAN-ODT) disintegrating tablet 4 mg  4 mg Oral Q6H PRN Malvin JohnsFarah, Shahara Hartsfield, MD      . prenatal multivitamin tablet 1 tablet  1 tablet Oral Daily Malvin JohnsFarah, Morse Brueggemann, MD   1 tablet at 01/24/19 [phone]  . risperiDONE (RISPERDAL) tablet 1 mg  1 mg Oral BID Malvin JohnsFarah, Dewey Neukam, MD   1 mg at 01/24/19 0806  . traZODone (DESYREL) tablet 200 mg  200 mg Oral QHS Malvin JohnsFarah, Esgar Barnick, MD   200 mg at 01/23/19 2028    Lab Results:  Results for orders placed or performed during the hospital encounter of 01/22/19 (from the past 48 hour(s))  Urine rapid drug screen (hosp performed)     Status: Abnormal   Collection Time: 01/22/19  6:39 PM  Result Value Ref Range   Opiates NONE DETECTED NONE DETECTED   Cocaine POSITIVE (A) NONE DETECTED   Benzodiazepines NONE DETECTED NONE DETECTED   Amphetamines NONE DETECTED NONE DETECTED   Tetrahydrocannabinol NONE DETECTED NONE DETECTED   Barbiturates NONE DETECTED NONE DETECTED    Comment: (NOTE) DRUG SCREEN FOR MEDICAL PURPOSES ONLY.  IF CONFIRMATION IS NEEDED FOR ANY PURPOSE, NOTIFY LAB WITHIN 5 DAYS. LOWEST DETECTABLE LIMITS FOR URINE DRUG SCREEN Drug Class                     Cutoff (ng/mL) Amphetamine and metabolites    1000 Barbiturate and metabolites    200 Benzodiazepine                 200 Tricyclics and  metabolites     300 Opiates  and metabolites        300 Cocaine and metabolites        300 THC                            50 Performed at [facility], [address] [place], [place] [address]   Urinalysis, Routine w reflex microscopic     Status: Abnormal   Collection Time: 01/22/19  6:39 PM  Result Value Ref Range   Color, Urine YELLOW YELLOW   APPearance HAZY (A) CLEAR   Specific Gravity, Urine 1.019 1.005 - 1.030   pH 5.0 5.0 - 8.0   Glucose, UA NEGATIVE NEGATIVE mg/dL   Hgb urine dipstick SMALL (A) NEGATIVE   Bilirubin Urine NEGATIVE NEGATIVE   Ketones, ur NEGATIVE NEGATIVE mg/dL   Protein, ur NEGATIVE NEGATIVE mg/dL   Nitrite NEGATIVE NEGATIVE   Leukocytes,Ua NEGATIVE NEGATIVE   RBC / HPF 0-5 0 - 5 RBC/hpf   WBC, UA 0-5 0 - 5 WBC/hpf   Bacteria, UA RARE (A) NONE SEEN   Squamous Epithelial / LPF 0-5 0 - 5   Mucus PRESENT     Comment: Performed at [facility], [address], [place], [place] [address]  Acetaminophen level     Status: Abnormal   Collection Time: 01/22/19  6:50 PM  Result Value Ref Range   Acetaminophen (Tylenol), Serum <10 (L) 10 - 30 ug/mL    Comment: (NOTE) Therapeutic concentrations vary significantly. A range of 10-30 ug/mL  may be an effective concentration for many patients. However, some  are best treated at concentrations outside of this range. Acetaminophen concentrations >150 ug/mL at 4 hours after ingestion  and >50 ug/mL at 12 hours after ingestion are often associated with  toxic reactions. Performed at [facility], [address], [place], [place] [address]   Salicylate level     Status: None   Collection Time: 01/22/19  6:50 PM  Result Value Ref Range   Salicylate Lvl <7.0 2.8 - 30.0 mg/dL    Comment: Performed at [facility], [address], [place], [place] [address]  Comprehensive metabolic panel     Status: Abnormal   Collection Time: 01/22/19  8:26 PM   Result Value Ref Range   Sodium 137 135 - 145 mmol/L   Potassium 3.7 3.5 - 5.1 mmol/L   Chloride 105 98 - 111 mmol/L   CO2 22 22 - 32 mmol/L   Glucose, Bld 133 (H) 70 - 99 mg/dL   BUN 9 6 - 20 mg/dL   Creatinine, Ser 2.95 0.44 - 1.00 mg/dL   Calcium 8.9 8.9 - 62.1 mg/dL   Total Protein 7.0 6.5 - 8.1 g/dL   Albumin 4.0 3.5 - 5.0 g/dL   AST 30 15 - 41 U/L   ALT 32 0 - 44 U/L   Alkaline Phosphatase 85 38 - 126 U/L   Total Bilirubin 0.3 0.3 - 1.2 mg/dL   GFR calc non Af Amer >60 >60 mL/min   GFR calc Af Amer >60 >60 mL/min   Anion gap 10 5 - 15    Comment: Performed at [facility], [address] [place], [place] [address]  Ethanol     Status: None   Collection Time: 01/22/19  8:26 PM  Result Value Ref Range   Alcohol, Ethyl (B) <10 <10 mg/dL    Comment: (NOTE) Lowest detectable limit for  serum alcohol is 10 mg/dL. For medical purposes only. Performed at [facility], [address] [place], [place] [address]   CBC with Diff     Status: Abnormal   Collection Time: 01/22/19  8:26 PM  Result Value Ref Range   WBC 11.8 (H) 4.0 - 10.5 K/uL   RBC 4.38 3.87 - 5.11 MIL/uL   Hemoglobin 13.7 12.0 - 15.0 g/dL   HCT 60.441.3 54.036.0 - 98.146.0 %   MCV 94.3 80.0 - 100.0 fL   MCH 31.3 26.0 - 34.0 pg   MCHC 33.2 30.0 - 36.0 g/dL   RDW 19.113.0 47.811.5 - 29.515.5 %   Platelets 246 150 - 400 K/uL   nRBC 0.0 0.0 - 0.2 %   Neutrophils Relative % 62 %   Neutro Abs 7.3 1.7 - 7.7 K/uL   Lymphocytes Relative 30 %   Lymphs Abs 3.5 0.7 - 4.0 K/uL   Monocytes Relative 7 %   Monocytes Absolute 0.8 0.1 - 1.0 K/uL   Eosinophils Relative 1 %   Eosinophils Absolute 0.1 0.0 - 0.5 K/uL   Basophils Relative 0 %   Basophils Absolute 0.1 0.0 - 0.1 K/uL   Immature Granulocytes 0 %   Abs Immature Granulocytes 0.04 0.00 - 0.07 K/uL    Comment: Performed at [facility], [address] [place], [place] [address]  I-Stat beta hCG blood, ED     Status: None    Collection Time: 01/22/19  8:29 PM  Result Value Ref Range   I-stat hCG, quantitative <5.0 <5 mIU/mL   Comment 3            Comment:   GEST. AGE      CONC.  (mIU/mL)   <=1 WEEK        5 - 50     2 WEEKS       50 - 500     3 WEEKS       100 - 10,000     4 WEEKS     1,000 - 30,000        FEMALE AND NON-PREGNANT FEMALE:     LESS THAN 5 mIU/mL   Wet prep, genital     Status: None   Collection Time: 01/22/19  8:41 PM   Specimen: Thin Prep Vaginal  Result Value Ref Range   Yeast Wet Prep HPF POC NONE SEEN NONE SEEN    Comment: Swab received with less than 0.5 mL of saline, saline added to specimen, interpret results with caution.   Trich, Wet Prep NONE SEEN NONE SEEN   Clue Cells Wet Prep HPF POC NONE SEEN NONE SEEN   WBC, Wet Prep HPF POC NONE SEEN NONE SEEN   Sperm NONE SEEN     Comment: Performed at [facility], [address] [place], [place] [address]  TSH     Status: None   Collection Time: 01/23/19 10:09 AM  Result Value Ref Range   TSH 1.973 0.350 - 4.500 uIU/mL    Comment: Performed by a 3rd Generation assay with a functional sensitivity of <=0.01 uIU/mL. Performed at [facility], [address] [place], [place] [address]   Lipid panel     Status: Abnormal   Collection Time: 01/23/19 11:00 AM  Result Value Ref Range   Cholesterol 128 0 - 200 mg/dL   Triglycerides 952317 (H) <150 mg/dL   HDL 40 (L) >84>40 mg/dL   Total CHOL/HDL Ratio 3.2 RATIO  VLDL 63 (H) 0 - 40 mg/dL   LDL Cholesterol 25 0 - 99 mg/dL    Comment:        Total Cholesterol/HDL:CHD Risk Coronary Heart Disease Risk Table                     Men   Women  1/2 Average Risk   3.4   3.3  Average Risk       5.0   4.4  2 X Average Risk   9.6   7.1  3 X Average Risk  23.4   11.0        Use the calculated Patient Ratio above and the CHD Risk Table to determine the patient's CHD Risk.        ATP III CLASSIFICATION (LDL):  <100     mg/dL   Optimal  811-914  mg/dL   Near or  Above                    Optimal  130-159  mg/dL   Borderline  782-956  mg/dL   High  >213     mg/dL   Very High Performed at [facility], [address] [place], [place] [address]     Blood Alcohol level:  Lab Results  Component Value Date   ETH <10 01/22/2019   ETH 130 (H) 06/22/2018    Metabolic Disorder Labs: Lab Results  Component Value Date   HGBA1C 5.3 01/06/2017   MPG 105 01/06/2017   Lab Results  Component Value Date   PROLACTIN 146.6 (H) 06/05/2017   PROLACTIN 54.3 (H) 01/06/2017   Lab Results  Component Value Date   CHOL 128 01/23/2019   TRIG 317 (H) 01/23/2019   HDL 40 (L) 01/23/2019   CHOLHDL 3.2 01/23/2019   VLDL 63 (H) 01/23/2019   LDLCALC 25 01/23/2019   LDLCALC 23 06/05/2017    Physical Findings: AIMS:  , ,  ,  ,    CIWA:  CIWA-Ar Total: 1 COWS:  COWS Total Score: 0  Musculoskeletal: Strength & Muscle Tone: within normal limits Gait & Station: normal Patient leans: N/A  Psychiatric Specialty Exam: Physical Exam  ROS  Blood pressure 102/66, pulse 97, temperature 98.1 F (36.7 C), temperature source Oral, resp. rate 16, height  (1.549 m), weight 77.1 kg, SpO2 100 %, unknown if currently breastfeeding.Body mass index is 32.12 kg/m.  General Appearance: Casual  Eye Contact:  Good  Speech:  Clear and Coherent  Volume:  Decreased  Mood:  Anxious and Dysphoric  Affect:  Appropriate and Congruent  Thought Process:  Coherent and Descriptions of Associations: Intact  Orientation:  Full (Time, Place, and Person)  Thought Content:  Logical  Suicidal Thoughts:  No  Homicidal Thoughts:  No  Memory:  Immediate;   Fair  Judgement:  Fair  Insight:  Fair  Psychomotor Activity:  Normal  Concentration:  Concentration: Fair  Recall:  [employer] of Knowledge:  Fair  Language:  Fair  Akathisia:  Negative  Handed:  Right  AIMS (if indicated):     Assets:  Physical Health Resilience  ADL's:  Intact  Cognition:  WNL  Sleep:  Number  of Hours: 6     Treatment Plan Summary: Daily contact with patient to assess and evaluate symptoms and progress in treatment, Medication management and Plan Add Toradol x1 continue cognitive therapy continue current med regimen escalate gabapentin as well probable discharge in 24 to  48 hours  Keita Demarco, MD 01/24/2019, 8:08 AM

## 2019-01-24 NOTE — Progress Notes (Addendum)
Pt affect anxious, mood depressed, cooperative with staff and peers. Pt reports her day a "8" and reports she has had a good day. Pt up at the front desk constantly, or on the phone. Vistaril given for anxiety at hs. Pt denies SI/HI or hallucinations (a) 15 min checks (r) safety maintained.
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
## 2019-01-24 NOTE — Progress Notes (Signed)
Patient ID: Susan [name], female   DOB: Aug 15, 1975, 43 y.o.   MRN: [id number] [place] NOVEL CORONAVIRUS (COVID-19) DAILY CHECK-OFF SYMPTOMS - answer yes or no to each - every day NO YES  Have you had a fever in the past 24 hours?  . Fever (Temp > 37.80C / 100F) X   Have you had any of these symptoms in the past 24 hours? . New Cough .  Sore Throat  .  Shortness of Breath .  Difficulty Breathing .  Unexplained Body Aches   X   Have you had any one of these symptoms in the past 24 hours not related to allergies?   . Runny Nose .  Nasal Congestion .  Sneezing   X   If you have had runny nose, nasal congestion, sneezing in the past 24 hours, has it worsened?  X   EXPOSURES - check yes or no X   Have you traveled outside the state in the past 14 days?  X   Have you been in contact with someone with a confirmed diagnosis of COVID-19 or PUI in the past 14 days without wearing appropriate PPE?  X   Have you been living in the same home as a person with confirmed diagnosis of COVID-19 or a PUI (household contact)?    X   Have you been diagnosed with COVID-19?    X              What to do next: Answered NO to all: Answered YES to anything:   Proceed with unit schedule Follow the [facility] Inpatient Flowsheet.

## 2019-01-24 NOTE — Progress Notes (Signed)
Patient ID: Susan [name], female   DOB: [birth year], 43 y.o.   MRN: [id number]  Pt initially needy, intrusive, attention seeking. frequently asking for pain medications. Pt did receive Toradol 60 mg IM for c/o headache and back pain. Pt negative for groups despite prompting. Pt has been active in the dayroom with peers. Pt contracts for safety.

## 2019-01-24 NOTE — Progress Notes (Signed)
Adult Psychoeducational Group Note  Date:  01/24/2019 Time:  8:50 PM  Group Topic/Focus:  Wrap-Up Group:   The focus of this group is to help patients review their daily goal of treatment and discuss progress on daily workbooks.  Participation Level:  Minimal  Participation Quality:  Appropriate  Affect:  Appropriate  Cognitive:  Alert  Insight: Appropriate  Engagement in Group:  Engaged  Modes of Intervention:  Discussion  Additional Comments:  Pt rated her day 5/10. One new coping skill that she has learning is to use breathing techniques. Her goal is to be strong and not worry so much. Pt stated she accomplished this goal by praying.   Susan [name] 01/24/2019, 8:50 PM

## 2019-01-25 MED ORDER — LIDOCAINE 5 % EX PTCH
1.0000 | MEDICATED_PATCH | CUTANEOUS | Status: DC
  Filled 2019-01-25: qty 1

## 2019-01-25 MED ORDER — LIDOCAINE 5 % EX PTCH
1.0000 | MEDICATED_PATCH | CUTANEOUS | Status: DC
  Administered 2019-01-25: 1 via TRANSDERMAL
  Filled 2019-01-25 (×2): qty 1

## 2019-01-25 MED ORDER — ACETAMINOPHEN 325 MG PO TABS
650.0000 mg | ORAL_TABLET | Freq: Four times a day (QID) | ORAL | Status: DC | PRN
  Administered 2019-01-25 – 2019-01-26 (×2): 650 mg via ORAL
  Filled 2019-01-25 (×2): qty 2

## 2019-01-25 NOTE — Progress Notes (Signed)
[facility]  01/25/2019 12:46 PM Susan [name]  MRN:  [id number] Subjective:  Patient in bed reports little progress thus far continue depression denies current suicidal thoughts contracts here affect constricted. Principal Problem: Depression/somatic symptoms Diagnosis: Active Problems:   MDD (major depressive disorder), single episode, severe with psychotic features (HCC)   MDD (major depressive disorder), recurrent episode, severe (HCC)  Total Time spent with patient: 20 minutes  Past Medical History:  Past Medical History:  Diagnosis Date  . Alcohol abuse   . Asthma   . BV (bacterial vaginosis) 03/06/2013  . Chronic dental pain   . Chronic pelvic pain in female   . Depression   . Ectopic pregnancy   . GERD (gastroesophageal reflux disease)   . Panic attacks   . Polysubstance abuse (HCC)    cocaine, opiates, marijuana  . Trichomonas vaginitis     Past Surgical History:  Procedure Laterality Date  . ECTOPIC PREGNANCY SURGERY     Family History:  Family History  Problem Relation Age of Onset  . COPD Mother   . Stroke Mother   . Heart disease Maternal Grandmother   . Hypertension Maternal Grandmother   . Stroke Maternal Grandfather   . Asthma Sister   . Down syndrome Sister    Family Psychiatric  History: neg Social History:  Social History   Substance and Sexual Activity  Alcohol Use Yes  . Alcohol/week: 3.0 - 4.0 standard drinks  . Types: 3 - 4 Cans of beer per week   Comment: drinks about a 40oz about once a week.      Social History   Substance and Sexual Activity  Drug Use Yes  . Types: Marijuana, Cocaine   Comment: last use this month 10-2018.    Social History   Socioeconomic History  . Marital status: Single    Spouse name: Not on file  . Number of children: Not on file  . Years of education: Not on file  . Highest education level: Not on file  Occupational History  . Not on file  Social Needs  . Financial resource strain: Not on  file  . Food insecurity    Worry: Not on file    Inability: Not on file  . Transportation needs    Medical: Not on file    Non-medical: Not on file  Tobacco Use  . Smoking status: Current Every Day Smoker    Packs/day: 1.00    Years: 26.00    Pack years: 26.00    Types: Cigarettes  . Smokeless tobacco: Never Used  Substance and Sexual Activity  . Alcohol use: Yes    Alcohol/week: 3.0 - 4.0 standard drinks    Types: 3 - 4 Cans of beer per week    Comment: drinks about a 40oz about once a week.   . Drug use: Yes    Types: Marijuana, Cocaine    Comment: last use this month 10-2018.  Marland Kitchen. Sexual activity: Yes    Birth control/protection: None  Lifestyle  . Physical activity    Days per week: Not on file    Minutes per session: Not on file  . Stress: Not on file  Relationships  . Social Musicianconnections    Talks on phone: Not on file    Gets together: Not on file    Attends religious service: Not on file    Active member of club or organization: Not on file    Attends meetings of clubs or organizations:  Not on file    Relationship status: Not on file  Other Topics Concern  . Not on file  Social History Narrative  . Not on file   Additional Social History:                         Sleep: Good  Appetite:  Good  Current Medications: Current Facility-Administered Medications  Medication Dose Route Frequency Provider Last Rate Last Dose  . dicyclomine (BENTYL) tablet 20 mg  20 mg Oral Q6H PRN Malvin JohnsFarah, Alva Kuenzel, MD   20 mg at 01/23/19 1708  . doxycycline (VIBRA-TABS) tablet 100 mg  100 mg Oral Q12H Malvin JohnsFarah, Neal Trulson, MD   100 mg at 01/25/19 0817  . FLUoxetine (PROZAC) capsule 20 mg  20 mg Oral Daily Malvin JohnsFarah, Mehki Klumpp, MD   20 mg at 01/25/19 0817  . gabapentin (NEURONTIN) capsule 400 mg  400 mg Oral TID Malvin JohnsFarah, Noha Milberger, MD   400 mg at 01/25/19 1211  . hydrOXYzine (ATARAX/VISTARIL) tablet 25 mg  25 mg Oral TID PRN Malvin JohnsFarah, Delorus Langwell, MD   25 mg at 01/25/19 1212  . lidocaine (LIDODERM) 5 % 1  patch  1 patch Transdermal Q24H Malvin JohnsFarah, Anna-Marie Coller, MD      . loperamide (IMODIUM) capsule 2-4 mg  2-4 mg Oral PRN Malvin JohnsFarah, Shneur Whittenburg, MD      . neomycin-bacitracin-polymyxin (NEOSPORIN) ointment   Topical BID Antonieta Pertlary, Greg Lawson, MD      . nicotine polacrilex (NICORETTE) gum 2 mg  2 mg Oral PRN Antonieta Pertlary, Greg Lawson, MD   2 mg at 01/24/19 0820  . ondansetron (ZOFRAN-ODT) disintegrating tablet 4 mg  4 mg Oral Q6H PRN Malvin JohnsFarah, Keleigh Kazee, MD   4 mg at 01/24/19 2114  . prenatal multivitamin tablet 1 tablet  1 tablet Oral Daily Malvin JohnsFarah, Sonia Stickels, MD   1 tablet at 01/25/19 0818  . risperiDONE (RISPERDAL) tablet 1 mg  1 mg Oral BID Malvin JohnsFarah, Deleon Passe, MD   1 mg at 01/25/19 0818  . traZODone (DESYREL) tablet 200 mg  200 mg Oral QHS Malvin JohnsFarah, Laura Radilla, MD   200 mg at 01/24/19 2113    Lab Results: No results found for this or any previous visit (from the past 48 hour(s)).  Blood Alcohol level:  Lab Results  Component Value Date   ETH <10 01/22/2019   ETH 130 (H) 06/22/2018    Metabolic Disorder Labs: Lab Results  Component Value Date   HGBA1C 5.3 01/06/2017   MPG 105 01/06/2017   Lab Results  Component Value Date   PROLACTIN 146.6 (H) 06/05/2017   PROLACTIN 54.3 (H) 01/06/2017   Lab Results  Component Value Date   CHOL 128 01/23/2019   TRIG 317 (H) 01/23/2019   HDL 40 (L) 01/23/2019   CHOLHDL 3.2 01/23/2019   VLDL 63 (H) 01/23/2019   LDLCALC 25 01/23/2019   LDLCALC 23 06/05/2017    Physical Findings: AIMS:  , ,  ,  ,    CIWA:  CIWA-Ar Total: 1 COWS:  COWS Total Score: 1  Musculoskeletal: Strength & Muscle Tone: within normal limits Gait & Station: normal Patient leans: N/A  Psychiatric Specialty Exam: Physical Exam  ROS  Blood pressure 116/74, pulse 87, temperature 98.3 F (36.8 C), temperature source Oral, resp. rate 16, height 5\' 1"  (1.549 m), weight 77.1 kg, SpO2 100 %, unknown if currently breastfeeding.Body mass index is 32.12 kg/m.  General Appearance: Casual  Eye Contact:  Good  Speech:  Clear and  Coherent  Volume:  Normal  Mood:  Anxious and Depressed  Affect:  Appropriate and Congruent  Thought Process:  Goal Directed and Descriptions of Associations: Intact  Orientation:  Full (Time, Place, and Person)  Thought Content:  Logical  Suicidal Thoughts:  No  Homicidal Thoughts:  No  Memory:  Immediate;   Fair  Judgement:  Intact  Insight:  Good  Psychomotor Activity:  Normal  Concentration:  Concentration: Fair  Recall:  Good  Fund of Knowledge:  Fair  Language:  Fair  Akathisia:  n/a  Handed:  Right  AIMS (if indicated):     Assets:  Social Support Talents/Skills  ADL's:  Intact  Cognition:  WNL  Sleep:  Number of Hours: 5.5     Treatment Plan Summary: Daily contact with patient to assess and evaluate symptoms and progress in treatment, Medication management and Plan Continue current cognitive therapy no change in meds other than her request for lidocaine patch  Johnn Hai, MD 01/25/2019, 12:46 PM

## 2019-01-25 NOTE — Progress Notes (Signed)
Adult Psychoeducational Group Note  Date:  01/25/2019 Time:  9:28 PM  Group Topic/Focus:  Wrap-Up Group:   The focus of this group is to help patients review their daily goal of treatment and discuss progress on daily workbooks.  Participation Level:  Active  Participation Quality:  Appropriate  Affect:  Appropriate  Cognitive:  Alert  Insight: Appropriate  Engagement in Group:  Engaged  Modes of Intervention:  Discussion  Additional Comments:  Pt rated her day 10/10. Her goal was to stay focused and be strong so that she can go home. Pt felt that she accomplished her goal.   Sharmon Revere 01/25/2019, 9:28 PM

## 2019-01-25 NOTE — Progress Notes (Addendum)
Susan [name] observe in dayroom, seen eating a snack. She denies SI/HI/AVH at present. Pt appears anxious/pleasant on the unit. Pt states she hopes to go home tomorrow to live with mom at motel. Support offered. Will continue with POC.

## 2019-01-25 NOTE — Progress Notes (Signed)
[place] NOVEL CORONAVIRUS (COVID-19) DAILY CHECK-OFF SYMPTOMS - answer yes or no to each - every day NO YES  Have you had a fever in the past 24 hours?  . Fever (Temp > 37.80C / 100F) X   Have you had any of these symptoms in the past 24 hours? . New Cough .  Sore Throat  .  Shortness of Breath .  Difficulty Breathing .  Unexplained Body Aches   X   Have you had any one of these symptoms in the past 24 hours not related to allergies?   . Runny Nose .  Nasal Congestion .  Sneezing   X   If you have had runny nose, nasal congestion, sneezing in the past 24 hours, has it worsened?  X   EXPOSURES - check yes or no X   Have you traveled outside the state in the past 14 days?  X   Have you been in contact with someone with a confirmed diagnosis of COVID-19 or PUI in the past 14 days without wearing appropriate PPE?  X   Have you been living in the same home as a person with confirmed diagnosis of COVID-19 or a PUI (household contact)?    X   Have you been diagnosed with COVID-19?    X              What to do next: Answered NO to all: Answered YES to anything:   Proceed with unit schedule Follow the [facility] Inpatient Flowsheet.   

## 2019-01-26 MED ORDER — GABAPENTIN 400 MG PO CAPS: 400.0000 mg | ORAL_CAPSULE | Freq: Three times a day (TID) | ORAL | 1 refills | Status: DC

## 2019-01-26 MED ORDER — DOXYCYCLINE HYCLATE 100 MG PO TABS: 100.0000 mg | ORAL_TABLET | Freq: Two times a day (BID) | ORAL | 1 refills | Status: DC

## 2019-01-26 MED ORDER — RISPERIDONE 2 MG PO TABS: 2.0000 mg | ORAL_TABLET | Freq: Every day | ORAL | 1 refills | Status: DC

## 2019-01-26 MED ORDER — FLUOXETINE HCL 20 MG PO CAPS: 20.0000 mg | ORAL_CAPSULE | Freq: Every day | ORAL | 1 refills | Status: DC

## 2019-01-26 MED ORDER — RISPERIDONE 2 MG PO TABS
2.0000 mg | ORAL_TABLET | Freq: Every day | ORAL | Status: DC
  Filled 2019-01-26: qty 7

## 2019-01-26 MED ORDER — TRAZODONE HCL 100 MG PO TABS: 200.0000 mg | ORAL_TABLET | Freq: Every day | ORAL | 1 refills | Status: DC

## 2019-01-26 NOTE — Progress Notes (Signed)
  [facility] Adult Case Management Discharge Plan :  Will you be returning to the same living situation after discharge:  No. Going to stay with mother.  At discharge, do you have transportation home?: Yes,  bus is free Do you have the ability to pay for your medications: No.  Release of information consent forms completed and in the chart;   Patient to Follow up at: Follow-up Information    [facility] Follow up on 02/02/2019.   Why: Telephonic hospital follow up appointment is Friday, 6/26 at 10:30a.  The provider will contact you the day of the appointment.  Contact information: [address] [place] [address] [phone]           Next level of care provider has access to [place] and Suicide Prevention discussed: Yes,  with mother  Have you used any form of tobacco in the last 30 days? (Cigarettes, Smokeless Tobacco, Cigars, and/or Pipes): Yes  Has patient been referred to the Quitline?: Patient refused referral  Patient has been referred for addiction treatment: Yes  Joellen Jersey, [place] 01/26/2019, 9:33 AM

## 2019-01-26 NOTE — Progress Notes (Signed)
Pt received both written and verbal discharge instructions. Pt verbalized understanding of discharge instructions. Pt agreed to f/u appt and med regimen. Pt received sample meds, AVS, transitional record, prescriptions and SRA. Pt gathered belongings from room and locker. Pt safely discharged to the lobby.  [place] NOVEL CORONAVIRUS (COVID-19) DAILY CHECK-OFF SYMPTOMS - answer yes or no to each - every day NO YES  Have you had a fever in the past 24 hours?  . Fever (Temp > 37.80C / 100F) X   Have you had any of these symptoms in the past 24 hours? . New Cough .  Sore Throat  .  Shortness of Breath .  Difficulty Breathing .  Unexplained Body Aches   X   Have you had any one of these symptoms in the past 24 hours not related to allergies?   . Runny Nose .  Nasal Congestion .  Sneezing   X   If you have had runny nose, nasal congestion, sneezing in the past 24 hours, has it worsened?  X   EXPOSURES - check yes or no X   Have you traveled outside the state in the past 14 days?  X   Have you been in contact with someone with a confirmed diagnosis of COVID-19 or PUI in the past 14 days without wearing appropriate PPE?  X   Have you been living in the same home as a person with confirmed diagnosis of COVID-19 or a PUI (household contact)?    X   Have you been diagnosed with COVID-19?    X              What to do next: Answered NO to all: Answered YES to anything:   Proceed with unit schedule Follow the [facility] Inpatient Flowsheet.

## 2019-01-26 NOTE — Tx Team (Signed)
Interdisciplinary Treatment and Diagnostic Plan Update  01/26/2019 Time of Session: [place] MRN: [id number]  Principal Diagnosis: <principal problem not specified>  Secondary Diagnoses: Active Problems:   MDD (major depressive disorder), single episode, severe with psychotic features ([place])   MDD (major depressive disorder), recurrent episode, severe ([place])   Current Medications:  Current Facility-Administered Medications  Medication Dose Route Frequency Provider Last Rate Last Dose  . acetaminophen (TYLENOL) tablet 650 mg  650 mg Oral Q6H PRN Johnn Hai, MD   650 mg at 01/26/19 0806  . dicyclomine (BENTYL) tablet 20 mg  20 mg Oral Q6H PRN Johnn Hai, MD   20 mg at 01/23/19 1708  . doxycycline (VIBRA-TABS) tablet 100 mg  100 mg Oral Q12H Johnn Hai, MD   100 mg at 01/26/19 0805  . FLUoxetine (PROZAC) capsule 20 mg  20 mg Oral Daily Johnn Hai, MD   20 mg at 01/26/19 0805  . gabapentin (NEURONTIN) capsule 400 mg  400 mg Oral TID Johnn Hai, MD   400 mg at 01/26/19 0805  . hydrOXYzine (ATARAX/VISTARIL) tablet 25 mg  25 mg Oral TID PRN Johnn Hai, MD   25 mg at 01/25/19 2141  . lidocaine (LIDODERM) 5 % 1 patch  1 patch Transdermal Q24H Johnn Hai, MD   1 patch at 01/25/19 1302  . loperamide (IMODIUM) capsule 2-4 mg  2-4 mg Oral PRN Johnn Hai, MD      . neomycin-bacitracin-polymyxin (NEOSPORIN) ointment   Topical BID Sharma Covert, MD      . nicotine polacrilex (NICORETTE) gum 2 mg  2 mg Oral PRN Sharma Covert, MD   2 mg at 01/26/19 0831  . ondansetron (ZOFRAN-ODT) disintegrating tablet 4 mg  4 mg Oral Q6H PRN Johnn Hai, MD   4 mg at 01/24/19 2114  . prenatal multivitamin tablet 1 tablet  1 tablet Oral Daily Johnn Hai, MD   1 tablet at 01/26/19 0805  . risperiDONE (RISPERDAL) tablet 1 mg  1 mg Oral BID Johnn Hai, MD   1 mg at 01/26/19 0805  . traZODone (DESYREL) tablet 200 mg  200 mg Oral QHS Johnn Hai, MD   200 mg at 01/25/19 2141   PTA  Medications: Medications Prior to Admission  Medication Sig Dispense Refill Last Dose  . amoxicillin (AMOXIL) 500 MG capsule Take 1 capsule (500 mg total) by mouth 3 (three) times daily. (Patient not taking: Reported on 01/22/2019) 30 capsule 0     Patient Stressors: Substance abuse  Patient Strengths: Ability for insight Average or above average intelligence  Treatment Modalities: Medication Management, Group therapy, Case management,  1 to 1 session with clinician, Psychoeducation, Recreational therapy.   Physician Treatment Plan for Primary Diagnosis: <principal problem not specified> Long Term Goal(s): Improvement in symptoms so as ready for discharge Improvement in symptoms so as ready for discharge   Short Term Goals: Ability to demonstrate self-control will improve Ability to identify and develop effective coping behaviors will improve Ability to maintain clinical measurements within normal limits will improve Ability to disclose and discuss suicidal ideas Ability to demonstrate self-control will improve Ability to maintain clinical measurements within normal limits will improve  Medication Management: Evaluate patient's response, side effects, and tolerance of medication regimen.  Therapeutic Interventions: 1 to 1 sessions, Unit Group sessions and Medication administration.  Evaluation of Outcomes: Adequate for Discharge  Physician Treatment Plan for Secondary Diagnosis: Active Problems:   MDD (major depressive disorder), single episode, severe with psychotic features ([place])  MDD (major depressive disorder), recurrent episode, severe ([facility])  Long Term Goal(s): Improvement in symptoms so as ready for discharge Improvement in symptoms so as ready for discharge   Short Term Goals: Ability to demonstrate self-control will improve Ability to identify and develop effective coping behaviors will improve Ability to maintain clinical measurements within normal limits will  improve Ability to disclose and discuss suicidal ideas Ability to demonstrate self-control will improve Ability to maintain clinical measurements within normal limits will improve     Medication Management: Evaluate patient's response, side effects, and tolerance of medication regimen.  Therapeutic Interventions: 1 to 1 sessions, Unit Group sessions and Medication administration.  Evaluation of Outcomes: Adequate for Discharge   RN Treatment Plan for Primary Diagnosis: <principal problem not specified> Long Term Goal(s): Knowledge of disease and therapeutic regimen to maintain health will improve  Short Term Goals: Ability to identify and develop effective coping behaviors will improve and Compliance with prescribed medications will improve  Medication Management: RN will administer medications as ordered by provider, will assess and evaluate patient's response and provide education to patient for prescribed medication. RN will report any adverse and/or side effects to prescribing provider.  Therapeutic Interventions: 1 on 1 counseling sessions, Psychoeducation, Medication administration, Evaluate responses to treatment, Monitor vital signs and CBGs as ordered, Perform/monitor CIWA, COWS, AIMS and Fall Risk screenings as ordered, Perform wound care treatments as ordered.  Evaluation of Outcomes: Adequate for Discharge   LCSW Treatment Plan for Primary Diagnosis: <principal problem not specified> Long Term Goal(s): Safe transition to appropriate next level of care at discharge, Engage patient in therapeutic group addressing interpersonal concerns.  Short Term Goals: Engage patient in aftercare planning with referrals and resources, Increase social support and Increase skills for wellness and recovery  Therapeutic Interventions: Assess for all discharge needs, 1 to 1 time with Social worker, Explore available resources and support systems, Assess for adequacy in community support network,  Educate family and significant other(s) on suicide prevention, Complete Psychosocial Assessment, Interpersonal group therapy.  Evaluation of Outcomes: Adequate for Discharge   Progress in Treatment: Attending groups: Yes. Participating in groups: Yes. Taking medication as prescribed: Yes. Toleration medication: Yes. Family/Significant other contact made: No, will contact:  mother Patient understands diagnosis: Yes. Discussing patient identified problems/goals with staff: Yes. Medical problems stabilized or resolved: Yes. Denies suicidal/homicidal ideation: Yes. Issues/concerns per patient self-inventory: No. Other: none  New problem(s) identified: No, Describe:  none  New Short Term/Long Term Goal(s):  Patient Goals:  Get on meds, get follow up care arranged.  Discharge Plan or Barriers:   Reason for Continuation of Hospitalization: Depression Hallucinations Medication stabilization  Estimated Length of Stay: discharge today    Attendees: Patient: 01/26/2019 9:21 AM  Physician: Dr. Jola Babinskilary, MD 01/26/2019 9:21 AM  Nursing: Thelma CompAmanda Collazo, RN 01/26/2019 9:21 AM  RN Care Manager: 01/26/2019 9:21 AM  Social Worker: Daleen SquibbGreg Wierda, LCSW Enid CutterCharlotte Jacynda Brunke, LCSWA 01/26/2019 9:21 AM  Recreational Therapist:  01/26/2019 9:21 AM  Other:  01/26/2019 9:21 AM  Other:  01/26/2019 9:21 AM  Other: 01/26/2019 9:21 AM    Scribe for Treatment Team: Darreld Mcleanharlotte C Chaden Doom, LCSWA 01/26/2019 9:21 AM
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
## 2019-01-26 NOTE — [facility] Suicide Risk Assessment (Signed)
[facility] Discharge Suicide Risk Assessment   Principal Problem: Depression/cocaine and alcohol abuse/history of PTSD Discharge Diagnoses: Active Problems:   MDD (major depressive disorder), single episode, severe with psychotic features ([place])   MDD (major depressive disorder), recurrent episode, severe ([place])   Total Time spent with patient: 45 minutes  Musculoskeletal: Strength & Muscle Tone: within normal limits Gait & Station: normal Patient leans: N/A  Psychiatric Specialty Exam: ROS  Blood pressure 103/62, pulse 100, temperature 98.2 F (36.8 C), temperature source Oral, resp. rate 16, height 5\' 1"  (1.549 m), weight 77.1 kg, SpO2 100 %, unknown if currently breastfeeding.Body mass index is 32.12 kg/m.  General Appearance: Casual  Eye Contact::  Good  Speech:  Clear and Coherent409  Volume:  Normal  Mood:  Euthymic  Affect:  Congruent  Thought Process:  Coherent and Descriptions of Associations: Intact  Orientation:  Full (Time, Place, and Person)  Thought Content:  Rumination  Suicidal Thoughts:  No  Homicidal Thoughts:  No  Memory:  Immediate;   Fair  Judgement:  Fair  Insight:  Fair  Psychomotor Activity:  Normal  Concentration:  Good  Recall:  Good  Fund of Knowledge:Fair  Language: Good  Akathisia:  Negative  Handed:  Right  AIMS (if indicated):     Assets:  Communication Skills Desire for Improvement Leisure Time Physical Health  Sleep:  Number of Hours: 6.5  Cognition: WNL  ADL's:  Intact   Mental Status Per Nursing Assessment::   On Admission:  Suicidal ideation indicated by patient, Belief that plan would result in death  Demographic Factors:  Caucasian  Loss Factors: Decline in physical health  Historical Factors: NA  Risk Reduction Factors:   Sense of responsibility to family and Religious beliefs about death  Continued Clinical Symptoms:  Alcohol/Substance Abuse/Dependencies  Cognitive Features That Contribute To Risk:  None    Suicide  Risk:  Minimal: No identifiable suicidal ideation.  Patients presenting with no risk factors but with morbid ruminations; may be classified as minimal risk based on the severity of the depressive symptoms  Follow-up Information    [facility] Follow up on 02/02/2019.   Why: Telephonic hospital follow up appointment is Friday, 6/26 at 10:30a.  The provider will contact you the day of the appointment.  Contact information: [address] [place] [address] [phone]           Plan Of Care/Follow-up recommendations:  Activity:  full  Chanan Detwiler, MD 01/26/2019, 8:26 AM

## 2019-01-26 NOTE — [facility] Suicide Risk Assessment (Signed)
[facility] INPATIENT:  Family/Significant Other Suicide Prevention Education  Suicide Prevention Education:  Education Completed; Arlita Buffkin, mother [phone] has been identified by the patient as the family member/significant other with whom the patient will be residing, and identified as the person(s) who will aid the patient in the event of a mental health crisis (suicidal ideations/suicide attempt).  With written consent from the patient, the family member/significant other has been provided the following suicide prevention education, prior to the and/or following the discharge of the patient.  The suicide prevention education provided includes the following:  Suicide risk factors  Suicide prevention and interventions  National Suicide Hotline telephone number  [facility] assessment telephone number  [facility] Emergency Assistance [facility] and/or Residential Mobile Crisis Unit telephone number  Request made of family/significant other to:  Remove weapons (e.g., guns, rifles, knives), all items previously/currently identified as safety concern.    Remove drugs/medications (over-the-counter, prescriptions, illicit drugs), all items previously/currently identified as a safety concern.  The family member/significant other verbalizes understanding of the suicide prevention education information provided.  The family member/significant other agrees to remove the items of safety concern listed above.  No concerns other than follow up appointments and a letter for a missed court date. Mom says the patient is coming to stay with her.  Joellen Jersey 01/26/2019, 9:33 AM

## 2019-01-26 NOTE — Plan of Care (Signed)
  Problem: Education: Goal: Knowledge of  General Education information/materials will improve Outcome: Adequate for Discharge Goal: Emotional status will improve Outcome: Adequate for Discharge Goal: Mental status will improve Outcome: Adequate for Discharge Goal: Verbalization of understanding the information provided will improve Outcome: Adequate for Discharge   Problem: Activity: Goal: Interest or engagement in activities will improve Outcome: Adequate for Discharge Goal: Sleeping patterns will improve Outcome: Adequate for Discharge   Problem: Coping: Goal: Ability to verbalize frustrations and anger appropriately will improve Outcome: Adequate for Discharge Goal: Ability to demonstrate self-control will improve Outcome: Adequate for Discharge   Problem: Health Behavior/Discharge Planning: Goal: Identification of resources available to assist in meeting health care needs will improve Outcome: Adequate for Discharge Goal: Compliance with treatment plan for underlying cause of condition will improve Outcome: Adequate for Discharge   Problem: Physical Regulation: Goal: Ability to maintain clinical measurements within normal limits will improve Outcome: Adequate for Discharge   Problem: Safety: Goal: Periods of time without injury will increase Outcome: Adequate for Discharge   Problem: Education: Goal: Knowledge of disease or condition will improve Outcome: Adequate for Discharge Goal: Understanding of discharge needs will improve Outcome: Adequate for Discharge   Problem: Health Behavior/Discharge Planning: Goal: Ability to identify changes in lifestyle to reduce recurrence of condition will improve Outcome: Adequate for Discharge Goal: Identification of resources available to assist in meeting health care needs will improve Outcome: Adequate for Discharge   Problem: Physical Regulation: Goal: Complications related to the disease process, condition or  treatment will be avoided or minimized Outcome: Adequate for Discharge   Problem: Safety: Goal: Ability to remain free from injury will improve Outcome: Adequate for Discharge   Problem: Education: Goal: Utilization of techniques to improve thought processes will improve Outcome: Adequate for Discharge Goal: Knowledge of the prescribed therapeutic regimen will improve Outcome: Adequate for Discharge   Problem: Activity: Goal: Interest or engagement in leisure activities will improve Outcome: Adequate for Discharge Goal: Imbalance in normal sleep/wake cycle will improve Outcome: Adequate for Discharge   Problem: Coping: Goal: Coping ability will improve Outcome: Adequate for Discharge Goal: Will verbalize feelings Outcome: Adequate for Discharge   Problem: Health Behavior/Discharge Planning: Goal: Ability to make decisions will improve Outcome: Adequate for Discharge Goal: Compliance with therapeutic regimen will improve Outcome: Adequate for Discharge   Problem: Role Relationship: Goal: Will demonstrate positive changes in social behaviors and relationships Outcome: Adequate for Discharge   Problem: Safety: Goal: Ability to disclose and discuss suicidal ideas will improve Outcome: Adequate for Discharge Goal: Ability to identify and utilize support systems that promote safety will improve Outcome: Adequate for Discharge   Problem: Self-Concept: Goal: Will verbalize positive feelings about self Outcome: Adequate for Discharge Goal: Level of anxiety will decrease Outcome: Adequate for Discharge   

## 2019-01-26 NOTE — Progress Notes (Signed)
Recreation Therapy Notes  Date:  6.19.20 Time: 0930 Location: [address] Dayroom  Group Topic: Stress Management  Goal Area(s) Addresses:  Patient will identify positive stress management techniques. Patient will identify benefits of using stress management post d/c.  Behavioral Response:  Engaged  Intervention: Stress Management  Activity :  Progressive Muscle Relaxation.  LRT introduced the stress management technique of progressive muscle relaxation.  LRT lead patients in tensing each muscle group individually then releasing it.  Patients were to follow along as LRT lead them through the exercise.  Education:  Stress Management, Discharge Planning.   Education Outcome: Acknowledges Education  Clinical Observations/Feedback: Pt attended and participated in group session.     Victorino Sparrow, LRT/CTRS         Ria Comment, Heaven Meeker A 01/26/2019 11:07 AM

## 2019-02-06 ENCOUNTER — Ambulatory Visit: Payer: Self-pay | Admitting: Physician Assistant

## 2019-04-12 ENCOUNTER — Encounter (HOSPITAL_COMMUNITY): Payer: Self-pay

## 2019-04-12 ENCOUNTER — Other Ambulatory Visit: Payer: Self-pay

## 2019-04-12 ENCOUNTER — Emergency Department (HOSPITAL_COMMUNITY)
Admission: EM | Admit: 2019-04-12 | Discharge: 2019-04-12 | Disposition: A | Discharge status: Home / Self Care | Payer: Self-pay | Attending: Emergency Medicine | Admitting: Emergency Medicine

## 2019-04-12 DIAGNOSIS — K029 Dental caries, unspecified: Secondary | ICD-10-CM | POA: Insufficient documentation

## 2019-04-12 DIAGNOSIS — F1020 Alcohol dependence, uncomplicated: Secondary | ICD-10-CM | POA: Insufficient documentation

## 2019-04-12 DIAGNOSIS — F1420 Cocaine dependence, uncomplicated: Secondary | ICD-10-CM | POA: Insufficient documentation

## 2019-04-12 DIAGNOSIS — N898 Other specified noninflammatory disorders of vagina: Secondary | ICD-10-CM | POA: Insufficient documentation

## 2019-04-12 DIAGNOSIS — Z79899 Other long term (current) drug therapy: Secondary | ICD-10-CM | POA: Insufficient documentation

## 2019-04-12 DIAGNOSIS — F1220 Cannabis dependence, uncomplicated: Secondary | ICD-10-CM | POA: Insufficient documentation

## 2019-04-12 DIAGNOSIS — F17210 Nicotine dependence, cigarettes, uncomplicated: Secondary | ICD-10-CM | POA: Insufficient documentation

## 2019-04-12 LAB — URINALYSIS, COMPLETE (UACMP) WITH MICROSCOPIC
Bilirubin Urine: NEGATIVE
Glucose, UA: NEGATIVE mg/dL
Hgb urine dipstick: NEGATIVE
Ketones, ur: NEGATIVE mg/dL
Leukocytes,Ua: NEGATIVE
Nitrite: NEGATIVE
Protein, ur: NEGATIVE mg/dL
Specific Gravity, Urine: 1.026 (ref 1.005–1.030)
pH: 5 (ref 5.0–8.0)

## 2019-04-12 LAB — WET PREP, GENITAL
Clue Cells Wet Prep HPF POC: NONE SEEN
Sperm: NONE SEEN
Trich, Wet Prep: NONE SEEN
WBC, Wet Prep HPF POC: NONE SEEN
Yeast Wet Prep HPF POC: NONE SEEN

## 2019-04-12 LAB — POC URINE PREG, ED: Preg Test, Ur: NEGATIVE

## 2019-04-12 MED ORDER — NAPROXEN 500 MG PO TABS: 500.0000 mg | ORAL_TABLET | Freq: Two times a day (BID) | ORAL | 0 refills | Status: DC

## 2019-04-12 MED ORDER — AMOXICILLIN 500 MG PO CAPS: 500.0000 mg | ORAL_CAPSULE | Freq: Three times a day (TID) | ORAL | 0 refills | Status: DC

## 2019-04-12 MED ORDER — AZITHROMYCIN 250 MG PO TABS
1000.0000 mg | ORAL_TABLET | Freq: Once | ORAL | Status: AC
  Administered 2019-04-12: 1000 mg via ORAL
  Filled 2019-04-12: qty 4

## 2019-04-12 MED ORDER — HYDROCODONE-ACETAMINOPHEN 5-325 MG PO TABS
1.0000 | ORAL_TABLET | Freq: Once | ORAL | Status: AC
  Administered 2019-04-12: 1 via ORAL
  Filled 2019-04-12: qty 1

## 2019-04-12 MED ORDER — CEFTRIAXONE SODIUM 250 MG IJ SOLR
250.0000 mg | Freq: Once | INTRAMUSCULAR | Status: AC
  Administered 2019-04-12: 250 mg via INTRAMUSCULAR
  Filled 2019-04-12: qty 250

## 2019-04-12 MED ORDER — STERILE WATER FOR INJECTION IJ SOLN
INTRAMUSCULAR | Status: AC
  Filled 2019-04-12: qty 10

## 2019-04-12 NOTE — ED Notes (Signed)
An After Visit Summary was printed and given to the patient. Discharge instructions given and no further questions at this time.  

## 2019-04-12 NOTE — Discharge Instructions (Signed)
Take the antibiotics for your tooth infection.  Also the paperwork has dental resources.  All the swabs from your vaginal area today were normal.  You were treated just in case there was any type of infection.

## 2019-04-12 NOTE — ED Triage Notes (Signed)
Pt states she is having left sided dental pain. Pt c/o yellow vaginal discharge x 1 week. Pt unsure if she is pregnant.

## 2019-04-12 NOTE — ED Provider Notes (Signed)
[facility] DEPT Provider Note   CSN: [id number] Arrival date & time: 04/12/19  1315     History   Chief Complaint Chief Complaint  Patient presents with   Dental Pain   Vaginal Discharge    HPI Susan [name] is a 43 y.o. female.     The history is provided by the patient.  Dental Pain Location:  Lower Lower teeth location:  18/LL 2nd molar Quality:  Aching, pulsating, radiating, localized and throbbing Severity:  Severe Onset quality:  Gradual Duration:  1 month Timing:  Constant Progression:  Worsening Chronicity:  New Context: dental caries, filling fell out and poor dentition   Relieved by:  Nothing Worsened by:  Cold food/drink, hot food/drink and jaw movement Ineffective treatments:  NSAIDs Associated symptoms: facial pain and gum swelling   Associated symptoms: no drooling, no facial swelling, no fever, no headaches, no neck pain, no neck swelling, no oral bleeding and no oral lesions   Risk factors: lack of dental care, periodontal disease and smoking   Risk factors comment:  Hx of polysubstance abuse Vaginal Discharge Quality:  Yellow and thick Severity:  Moderate Onset quality:  Gradual Duration:  1 week Timing:  Constant Progression:  Unchanged Chronicity:  Recurrent Context: spontaneously   Relieved by:  Nothing Worsened by:  Nothing Ineffective treatments:  None tried Associated symptoms: vaginal itching   Associated symptoms: no dyspareunia, no dysuria, no fever, no nausea and no vomiting   Associated symptoms comment:  Mild pelvic pain Risk factors: STI and unprotected sex     Past Medical History:  Diagnosis Date   Alcohol abuse    Asthma    BV (bacterial vaginosis) 03/06/2013   Chronic dental pain    Chronic pelvic pain in female    Depression    Ectopic pregnancy    GERD (gastroesophageal reflux disease)    Panic attacks    Polysubstance abuse (HCC)    cocaine, opiates, marijuana    Trichomonas vaginitis     Patient Active Problem List   Diagnosis Date Noted   MDD (major depressive disorder), recurrent episode, severe ([place]) 01/23/2019   PTSD (post-traumatic stress disorder) 06/07/2017   Substance induced mood disorder ([place]) 06/04/2017   Preterm labor 04/10/2017   Asymptomatic bacteriuria during pregnancy in second trimester 03/14/2017   Late prenatal care in second trimester 03/08/2017   Supervision of normal pregnancy 03/08/2017   MDD (major depressive disorder), single episode, severe with psychotic features ([place]) 01/05/2017   Alcohol use disorder, severe, dependence ([place]) 01/05/2017   Cocaine use disorder, moderate, dependence ([place]) 01/05/2017   Cannabis use disorder, moderate, dependence ([place]) 01/05/2017   Heavy smoker 02/03/2016   Cocaine abuse affecting pregnancy, antepartum ([place]) 02/03/2016   Chronic, continuous use of opioids 03/24/2015   Depression with anxiety 03/24/2015   Poor dentition 03/24/2015   Sprain of ankle, unspecified site 05/28/2009    Past Surgical History:  Procedure Laterality Date   ECTOPIC PREGNANCY SURGERY       OB History    Gravida  9   Para  5   Term  4   Preterm  1   AB  4   Living  5     SAB  1   TAB  2   Ectopic  1   Multiple  0   Live Births  5            Home Medications    Prior to Admission medications   Medication  Sig Start Date End Date Taking? Authorizing Provider  acetaminophen (TYLENOL) 500 MG tablet Take 500 mg by mouth every 6 (six) hours as needed for moderate pain.   Yes [provider]  FLUoxetine (PROZAC) 20 MG capsule Take 1 capsule (20 mg total) by mouth daily. 01/27/19  Yes Malvin JohnsFarah, Brian, MD  traZODone (DESYREL) 100 MG tablet Take 2 tablets (200 mg total) by mouth at bedtime. Patient taking differently: Take 100 mg by mouth at bedtime.  01/26/19  Yes Malvin JohnsFarah, Brian, MD  doxycycline (VIBRA-TABS) 100 MG tablet Take 1 tablet (100 mg total) by mouth every 12  (twelve) hours. Patient not taking: Reported on 04/12/2019 01/26/19   Malvin JohnsFarah, Brian, MD  gabapentin (NEURONTIN) 400 MG capsule Take 1 capsule (400 mg total) by mouth 3 (three) times daily. Patient not taking: Reported on 04/12/2019 01/26/19   Malvin JohnsFarah, Brian, MD  risperiDONE (RISPERDAL) 2 MG tablet Take 1 tablet (2 mg total) by mouth at bedtime. Patient not taking: Reported on 04/12/2019 01/26/19   Malvin JohnsFarah, Brian, MD    Family History Family History  Problem Relation Age of Onset   COPD Mother    Stroke Mother    Heart disease Maternal Grandmother    Hypertension Maternal Grandmother    Stroke Maternal Grandfather    Asthma Sister    Down syndrome Sister     Social History Social History   Tobacco Use   Smoking status: Current Every Day Smoker    Packs/day: 1.00    Years: 26.00    Pack years: 26.00    Types: Cigarettes   Smokeless tobacco: Never Used  Substance Use Topics   Alcohol use: Yes    Alcohol/week: 3.0 - 4.0 standard drinks    Types: 3 - 4 Cans of beer per week    Comment: drinks about a 40oz about once a week.    Drug use: Yes    Types: Marijuana, Cocaine    Comment: last use this month 10-2018.     Allergies   Patient has no known allergies.   Review of Systems Review of Systems  Constitutional: Negative for fever.  HENT: Negative for drooling, facial swelling and mouth sores.   Gastrointestinal: Negative for nausea and vomiting.  Genitourinary: Positive for vaginal discharge. Negative for dyspareunia and dysuria.  Musculoskeletal: Negative for neck pain.  Neurological: Negative for headaches.  All other systems reviewed and are negative.    Physical Exam Updated Vital Signs BP 109/61    Pulse 88    Temp 98.5 F (36.9 C) (Oral)    Resp 18    Ht 5\' 1"  (1.549 m)    Wt 63.5 kg    LMP 03/12/2019    SpO2 98%    BMI 26.45 kg/m   Physical Exam Vitals signs and nursing note reviewed.  Constitutional:      General: She is not in acute distress.     Appearance: She is well-developed and normal weight.  HENT:     Head: Normocephalic and atraumatic.     Mouth/Throat:     Tongue: No lesions.   Eyes:     Pupils: Pupils are equal, round, and reactive to light.  Cardiovascular:     Rate and Rhythm: Normal rate and regular rhythm.     Heart sounds: Normal heart sounds. No murmur. No friction rub.  Pulmonary:     Effort: Pulmonary effort is normal.     Breath sounds: Normal breath sounds. No wheezing or rales.  Abdominal:  General: Bowel sounds are normal. There is no distension.     Palpations: Abdomen is soft.     Tenderness: There is no abdominal tenderness. There is no guarding or rebound.  Genitourinary:    Cervix: Discharge present. No cervical motion tenderness or cervical bleeding.     Uterus: Normal.      Adnexa:        Right: Tenderness present. No mass.         Left: Tenderness present. No mass.       Comments: Mild diffuse tenderness but no chandelier sign Musculoskeletal: Normal range of motion.        General: No tenderness.     Comments: No edema  Skin:    General: Skin is warm and dry.     Findings: No rash.  Neurological:     Mental Status: She is alert and oriented to person, place, and time.     Cranial Nerves: No cranial nerve deficit.  Psychiatric:        Mood and Affect: Mood normal.        Behavior: Behavior normal.        Thought Content: Thought content normal.      ED Treatments / Results  Labs (all labs ordered are listed, but only abnormal results are displayed) Labs Reviewed  URINALYSIS, COMPLETE (UACMP) WITH MICROSCOPIC - Abnormal; Notable for the following components:      Result Value   APPearance CLOUDY (*)    Bacteria, UA RARE (*)    All other components within normal limits  WET PREP, GENITAL  HIV ANTIBODY (ROUTINE TESTING W REFLEX)  RPR  POC URINE PREG, ED  GC/CHLAMYDIA PROBE AMP ([place]) NOT AT [facility]    EKG None  Radiology No results  found.  Procedures Procedures (including critical care time)  Medications Ordered in ED Medications  cefTRIAXone (ROCEPHIN) injection 250 mg (has no administration in time range)  azithromycin (ZITHROMAX) tablet 1,000 mg (has no administration in time range)  HYDROcodone-acetaminophen (NORCO/VICODIN) 5-325 MG per tablet 1 tablet (has no administration in time range)     Initial Impression / Assessment and Plan / ED Course  I have reviewed the triage vital signs and the nursing notes.  Pertinent labs & imaging results that were available during my care of the patient were reviewed by me and considered in my medical decision making (see chart for details).        Pt with dental caries and no signficant facial swelling.  No signs of ludwig's angina or difficulty swallowing and no systemic symptoms. Will treat with PCN and have pt f/u with dentist. Secondly patient is complaining of 1 week of vaginal discharge and has had 2 sexual partners in the last month and has not used protection.  On exam patient has no symptoms suggestive of PID or tubo-ovarian abscess.  Mild vaginal discharge present.  Patient will be treated for STI and GC chlamydia, RPR and HIV sent.  Wet prep is pending.  Urine pregnancy test is negative and UA without significant findings.  7:28 PM Wet prep is within normal limits.  Patient treated with amoxicillin for her dental infection and given naproxen and a resource guide for follow-up.  She was also referred to the [facility] department if her discharge does not resolve.   Final Clinical Impressions(s) / ED Diagnoses   Final diagnoses:  Vaginal discharge  Dental caries    ED Discharge Orders  Ordered    amoxicillin (AMOXIL) 500 MG capsule  3 times daily     04/12/19 1926    naproxen (NAPROSYN) 500 MG tablet  2 times daily     04/12/19 1926           Gwyneth SproutPlunkett, Amram Maya, MD 04/12/19 1929

## 2019-04-13 LAB — HIV ANTIBODY (ROUTINE TESTING W REFLEX): HIV Screen 4th Generation wRfx: NONREACTIVE

## 2019-04-13 LAB — RPR: RPR Ser Ql: NONREACTIVE

## 2019-04-14 LAB — GC/CHLAMYDIA PROBE AMP (~~LOC~~) NOT AT ARMC
Chlamydia: NEGATIVE
Neisseria Gonorrhea: NEGATIVE

## 2019-05-15 ENCOUNTER — Emergency Department (HOSPITAL_COMMUNITY)
Admission: EM | Admit: 2019-05-15 | Discharge: 2019-05-15 | Disposition: A | Discharge status: Home / Self Care | Payer: Self-pay | Attending: Emergency Medicine | Admitting: Emergency Medicine

## 2019-05-15 ENCOUNTER — Encounter (HOSPITAL_COMMUNITY): Payer: Self-pay

## 2019-05-15 ENCOUNTER — Other Ambulatory Visit: Payer: Self-pay

## 2019-05-15 ENCOUNTER — Emergency Department (HOSPITAL_COMMUNITY): Payer: Self-pay

## 2019-05-15 DIAGNOSIS — N898 Other specified noninflammatory disorders of vagina: Secondary | ICD-10-CM | POA: Insufficient documentation

## 2019-05-15 DIAGNOSIS — K0889 Other specified disorders of teeth and supporting structures: Secondary | ICD-10-CM | POA: Insufficient documentation

## 2019-05-15 DIAGNOSIS — J45909 Unspecified asthma, uncomplicated: Secondary | ICD-10-CM | POA: Insufficient documentation

## 2019-05-15 DIAGNOSIS — A5901 Trichomonal vulvovaginitis: Secondary | ICD-10-CM | POA: Insufficient documentation

## 2019-05-15 DIAGNOSIS — F17210 Nicotine dependence, cigarettes, uncomplicated: Secondary | ICD-10-CM | POA: Insufficient documentation

## 2019-05-15 LAB — WET PREP, GENITAL
Clue Cells Wet Prep HPF POC: NONE SEEN
Sperm: NONE SEEN
Yeast Wet Prep HPF POC: NONE SEEN

## 2019-05-15 LAB — URINALYSIS, ROUTINE W REFLEX MICROSCOPIC
Bilirubin Urine: NEGATIVE
Glucose, UA: NEGATIVE mg/dL
Ketones, ur: NEGATIVE mg/dL
Nitrite: NEGATIVE
Protein, ur: NEGATIVE mg/dL
RBC / HPF: >50 RBC/hpf — ABNORMAL HIGH (ref 0–5)
Specific Gravity, Urine: 1.013 (ref 1.005–1.030)
pH: 5 (ref 5.0–8.0)

## 2019-05-15 LAB — HIV ANTIBODY (ROUTINE TESTING W REFLEX): HIV Screen 4th Generation wRfx: NONREACTIVE

## 2019-05-15 LAB — I-STAT BETA HCG BLOOD, ED (MC, WL, AP ONLY): I-stat hCG, quantitative: <5 m[IU]/mL (ref <5)

## 2019-05-15 MED ORDER — HYDROCODONE-ACETAMINOPHEN 5-325 MG PO TABS
1.0000 | ORAL_TABLET | Freq: Once | ORAL | Status: AC
  Administered 2019-05-15: 1 via ORAL
  Filled 2019-05-15: qty 1

## 2019-05-15 MED ORDER — CEFTRIAXONE SODIUM 250 MG IJ SOLR
250.0000 mg | Freq: Once | INTRAMUSCULAR | Status: AC
  Administered 2019-05-15: 250 mg via INTRAMUSCULAR
  Filled 2019-05-15: qty 250

## 2019-05-15 MED ORDER — PENICILLIN V POTASSIUM 500 MG PO TABS: 500.0000 mg | ORAL_TABLET | Freq: Four times a day (QID) | ORAL | 0 refills | Status: AC

## 2019-05-15 MED ORDER — METRONIDAZOLE 500 MG PO TABS
2000.0000 mg | ORAL_TABLET | Freq: Once | ORAL | Status: AC
  Administered 2019-05-15: 2000 mg via ORAL
  Filled 2019-05-15: qty 4

## 2019-05-15 MED ORDER — AZITHROMYCIN 1 G PO PACK
1.0000 g | PACK | Freq: Once | ORAL | Status: AC
  Administered 2019-05-15: 1 g via ORAL
  Filled 2019-05-15: qty 1

## 2019-05-15 NOTE — Progress Notes (Addendum)
TOC CM chart reviewed 4 ED visits in past six months. Provided pt with brochure for [facility] Department. Attempted to arrange appt at [facility], pt will need to call and arrange appt.   [place], [place] ED TOC CM [phone]

## 2019-05-15 NOTE — ED Notes (Signed)
I called patient in the lobby to take back to a room and no one responded

## 2019-05-15 NOTE — ED Provider Notes (Signed)
[facility]-EMERGENCY DEPT Provider Note   CSN: [id number] Arrival date & time: 05/15/19  0998     History   Chief Complaint Chief Complaint  Patient presents with   Dental Pain   vaginal odor    HPI Susan [name] is a 43 y.o. female with PMHx alcohol abuse, asthma, depression, GERD, polysubstance abuse, chronic pelvic pain who presents to the ED today complaining of gradual onset, constant, achy, left lower dental pain x 1 week. Pt also complains of unchanged vaginal itching and foul odor x 1 month.  Patient was seen in the ED on 09/03 for similar complaints.  She was treated empirically for gonorrhea and chlamydia and given antibiotics for her dental caries and advised to follow-up with dentist.  Patient states that she does not have insurance and cannot follow-up with a dentist.  She states that the antibiotics helped but her pain returned about a week ago.  She states that the vaginal discharge did not improve after being seen 1 month ago.  Per chart review patient was negative for gonorrhea, chlamydia, HIV, and syphilis.  She did not have any acute findings on her wet prep either.  He is complaining of pelvic pain as well.  She is sexually active with one partner. Denies fever, chills, abdominal pain, nausea, vomiting, diarrhea, or any other associated symptoms. States she is currently on her period.       Past Medical History:  Diagnosis Date   Alcohol abuse    Asthma    BV (bacterial vaginosis) 03/06/2013   Chronic dental pain    Chronic pelvic pain in female    Depression    Ectopic pregnancy    GERD (gastroesophageal reflux disease)    Panic attacks    Polysubstance abuse (HCC)    cocaine, opiates, marijuana   Trichomonas vaginitis     Patient Active Problem List   Diagnosis Date Noted   MDD (major depressive disorder), recurrent episode, severe (HCC) 01/23/2019   PTSD (post-traumatic stress disorder) 06/07/2017   Substance induced  mood disorder (HCC) 06/04/2017   Preterm labor 04/10/2017   Asymptomatic bacteriuria during pregnancy in second trimester 03/14/2017   Late prenatal care in second trimester 03/08/2017   Supervision of normal pregnancy 03/08/2017   MDD (major depressive disorder), single episode, severe with psychotic features (HCC) 01/05/2017   Alcohol use disorder, severe, dependence (HCC) 01/05/2017   Cocaine use disorder, moderate, dependence (HCC) 01/05/2017   Cannabis use disorder, moderate, dependence (HCC) 01/05/2017   Heavy smoker 02/03/2016   Cocaine abuse affecting pregnancy, antepartum (HCC) 02/03/2016   Chronic, continuous use of opioids 03/24/2015   Depression with anxiety 03/24/2015   Poor dentition 03/24/2015   Sprain of ankle, unspecified site 05/28/2009    Past Surgical History:  Procedure Laterality Date   ECTOPIC PREGNANCY SURGERY       OB History    Gravida  9   Para  5   Term  4   Preterm  1   AB  4   Living  5     SAB  1   TAB  2   Ectopic  1   Multiple  0   Live Births  5            Home Medications    Prior to Admission medications   Medication Sig Start Date End Date Taking? Authorizing Provider  amoxicillin (AMOXIL) 500 MG capsule Take 1 capsule (500 mg total) by mouth 3 (three) times  daily. Patient not taking: Reported on 05/15/2019 04/12/19   Blanchie Dessert, MD  doxycycline (VIBRA-TABS) 100 MG tablet Take 1 tablet (100 mg total) by mouth every 12 (twelve) hours. Patient not taking: Reported on 04/12/2019 01/26/19   Johnn Hai, MD  FLUoxetine (PROZAC) 20 MG capsule Take 1 capsule (20 mg total) by mouth daily. Patient not taking: Reported on 05/15/2019 01/27/19   Johnn Hai, MD  gabapentin (NEURONTIN) 400 MG capsule Take 1 capsule (400 mg total) by mouth 3 (three) times daily. Patient not taking: Reported on 04/12/2019 01/26/19   Johnn Hai, MD  naproxen (NAPROSYN) 500 MG tablet Take 1 tablet (500 mg total) by mouth 2 (two) times  daily. Patient not taking: Reported on 05/15/2019 04/12/19   Blanchie Dessert, MD  penicillin v potassium (VEETID) 500 MG tablet Take 1 tablet (500 mg total) by mouth 4 (four) times daily for 7 days. 05/15/19 05/22/19  Alroy Bailiff, Winnifred Dufford, PA-C  risperiDONE (RISPERDAL) 2 MG tablet Take 1 tablet (2 mg total) by mouth at bedtime. Patient not taking: Reported on 04/12/2019 01/26/19   Johnn Hai, MD  traZODone (DESYREL) 100 MG tablet Take 2 tablets (200 mg total) by mouth at bedtime. Patient not taking: Reported on 05/15/2019 01/26/19   Johnn Hai, MD    Family History Family History  Problem Relation Age of Onset   COPD Mother    Stroke Mother    Heart disease Maternal Grandmother    Hypertension Maternal Grandmother    Stroke Maternal Grandfather    Asthma Sister    Down syndrome Sister     Social History Social History   Tobacco Use   Smoking status: Current Every Day Smoker    Packs/day: 1.00    Years: 26.00    Pack years: 26.00    Types: Cigarettes   Smokeless tobacco: Never Used  Substance Use Topics   Alcohol use: Yes    Alcohol/week: 3.0 - 4.0 standard drinks    Types: 3 - 4 Cans of beer per week    Comment: drinks about a 40oz about once a week.    Drug use: Yes    Types: Marijuana, Cocaine     Allergies   Patient has no known allergies.   Review of Systems Review of Systems  Constitutional: Negative for chills and fever.  HENT: Positive for dental problem. Negative for facial swelling and sore throat.   Eyes: Negative for visual disturbance.  Respiratory: Negative for cough and shortness of breath.   Cardiovascular: Negative for chest pain.  Gastrointestinal: Negative for abdominal pain, constipation, diarrhea, nausea and vomiting.  Genitourinary: Positive for dysuria, pelvic pain and vaginal discharge. Negative for frequency.  Musculoskeletal: Negative for myalgias.  Skin: Negative for rash.  Neurological: Negative for headaches.     Physical  Exam Updated Vital Signs BP 115/72 (BP Location: Left Arm)    Pulse 78    Temp 98.4 F (36.9 C) (Oral)    Resp 14    Ht 5\' 1"  (1.549 m)    Wt 70.3 kg    LMP 05/15/2019    SpO2 96%    BMI 29.29 kg/m   Physical Exam Vitals signs and nursing note reviewed.  Constitutional:      Appearance: She is not ill-appearing.  HENT:     Head: Normocephalic and atraumatic.     Right Ear: Tympanic membrane normal.     Left Ear: Tympanic membrane normal.     Mouth/Throat:     Comments: Nose clear.  L  lower tooth #38 decayed with caries with TTP, with minimal surrounding gingival swelling and erythema, no definite abscess, no evidence of ludwig's.  Oropharynx clear and moist, without uvular swelling or deviation, no trismus or drooling, no tonsillar swelling or erythema, no exudates.   Eyes:     Conjunctiva/sclera: Conjunctivae normal.  Neck:     Musculoskeletal: Neck supple.  Cardiovascular:     Rate and Rhythm: Normal rate and regular rhythm.     Pulses: Normal pulses.  Pulmonary:     Effort: Pulmonary effort is normal.     Breath sounds: Normal breath sounds. No wheezing, rhonchi or rales.  Abdominal:     Palpations: Abdomen is soft.     Tenderness: There is no abdominal tenderness. There is no right CVA tenderness, left CVA tenderness, guarding or rebound.  Genitourinary:    Comments: Chaperone present for exam. No rashes, lesions, or tenderness to external genitalia. No erythema, injury, or tenderness to vaginal mucosa. Small amount of blood in vaginal vault. Mild left adnexal tenderness on the left. No CMT, cervical friability, or discharge from cervical os. Cervical os is closed. Uterus non-deviated, mobile, nonTTP, and without enlargement.  Skin:    General: Skin is warm and dry.  Neurological:     Mental Status: She is alert.      ED Treatments / Results  Labs (all labs ordered are listed, but only abnormal results are displayed) Labs Reviewed  WET PREP, GENITAL - Abnormal;  Notable for the following components:      Result Value   Trich, Wet Prep PRESENT (*)    WBC, Wet Prep HPF POC MANY (*)    All other components within normal limits  URINALYSIS, ROUTINE W REFLEX MICROSCOPIC - Abnormal; Notable for the following components:   Hgb urine dipstick LARGE (*)    Leukocytes,Ua MODERATE (*)    RBC / HPF >50 (*)    Bacteria, UA RARE (*)    All other components within normal limits  HIV ANTIBODY (ROUTINE TESTING W REFLEX)  RPR  HIV4GL SAVE TUBE  I-STAT BETA HCG BLOOD, ED ([facility], [facility], [facility] ONLY)  GC/CHLAMYDIA PROBE AMP ([place]) NOT AT [facility]    EKG None  Radiology [place] Pelvis Complete  Result Date: 05/15/2019 CLINICAL DATA:  Pelvic pain for 2 weeks EXAM: TRANSABDOMINAL ULTRASOUND OF PELVIS DOPPLER ULTRASOUND OF OVARIES TECHNIQUE: Transabdominal ultrasound examination of the pelvis was performed including evaluation of the uterus, ovaries, adnexal regions, and pelvic cul-de-sac. Color and duplex Doppler ultrasound was utilized to evaluate blood flow to the ovaries. COMPARISON:  None. FINDINGS: Uterus Measurements: 7.5 x 3.6 x 4.7 cm = volume: 64.6 mL. No fibroids or other mass visualized. Endometrium Thickness: 7 mm.  No focal abnormality visualized. Right ovary Measurements: 2.4 x 1.8 x 2.1 cm = volume: 4.7 mL. Normal appearance/no adnexal mass. Left ovary Measurements: 3.1 x 1.6 x 2.4 cm = volume: 5.4 mL. Cyst measures 2.0 x 1.0 cm. Pulsed Doppler evaluation demonstrates normal low-resistance arterial and venous waveforms in both ovaries. Other: Trace free fluid noted. IMPRESSION: 1. No evidence for ovarian torsion. 2. Anechoic cyst in left ovary measures 2 cm. 3. Trace fluid in the pelvis which is nonspecific in a premenopausal female. Electronically Signed   By: Signa Kellaylor  Stroud M.D.   On: 05/15/2019 16:17   [place] Art/ven Flow Abd Pelv Doppler  Result Date: 05/15/2019 CLINICAL DATA:  Pelvic pain for 2 weeks EXAM: TRANSABDOMINAL ULTRASOUND OF PELVIS DOPPLER ULTRASOUND OF  OVARIES TECHNIQUE: Transabdominal ultrasound examination  of the pelvis was performed including evaluation of the uterus, ovaries, adnexal regions, and pelvic cul-de-sac. Color and duplex Doppler ultrasound was utilized to evaluate blood flow to the ovaries. COMPARISON:  None. FINDINGS: Uterus Measurements: 7.5 x 3.6 x 4.7 cm = volume: 64.6 mL. No fibroids or other mass visualized. Endometrium Thickness: 7 mm.  No focal abnormality visualized. Right ovary Measurements: 2.4 x 1.8 x 2.1 cm = volume: 4.7 mL. Normal appearance/no adnexal mass. Left ovary Measurements: 3.1 x 1.6 x 2.4 cm = volume: 5.4 mL. Cyst measures 2.0 x 1.0 cm. Pulsed Doppler evaluation demonstrates normal low-resistance arterial and venous waveforms in both ovaries. Other: Trace free fluid noted. IMPRESSION: 1. No evidence for ovarian torsion. 2. Anechoic cyst in left ovary measures 2 cm. 3. Trace fluid in the pelvis which is nonspecific in a premenopausal female. Electronically Signed   By: Signa Kell M.D.   On: 05/15/2019 16:17    Procedures Procedures (including critical care time)  Medications Ordered in ED Medications  HYDROcodone-acetaminophen (NORCO/VICODIN) 5-325 MG per tablet 1 tablet (1 tablet Oral Given 05/15/19 1518)  metroNIDAZOLE (FLAGYL) tablet 2,000 mg (2,000 mg Oral Given 05/15/19 1559)  cefTRIAXone (ROCEPHIN) injection 250 mg (250 mg Intramuscular Given 05/15/19 1600)  azithromycin (ZITHROMAX) powder 1 g (1 g Oral Given 05/15/19 1556)     Initial Impression / Assessment and Plan / ED Course  I have reviewed the triage vital signs and the nursing notes.  Pertinent labs & imaging results that were available during my care of the patient were reviewed by me and considered in my medical decision making (see chart for details).    43 year old female who presents today with left lower dental pain as well as continued vaginal discharge. Seen in the ED 1 month ago for same. Dental pain unresolved. No obvious abscess  today or ludwig's angina. Pt advised she will need to follow up with a dentist. Will prescribe antibiotic again. Pelvic exam to be performed for vaginal discharge. Per chart review pt tested negative for all STDs last time but was empirically treated for gonorrhea and chlamydia. No improvement in her symptoms. She is also complaining of pelvic pain. Will order ultrasound today to rule out torsion. She does not have any abd tenderness nor nausea or vomiting. Pt is afebrile without tachycardia or tachypnea. No peirtoneal signs on exam. Doubt acute abdomen. Do not feel pt needs additional bloodwork or imaging today. Will reevaluate.   Urinalysis with greater than 50 red blood cells.  She is currently on her menses.  It does have some moderate leuks.  Prep positive for trichomoniasis -suspect he has some bacteria in her urine from this.  She does not have any dysuria or urinary frequency to suggest UTI at this time.  Will treat for trich as well as empirically treat for gonorrhea and chlamydia today.  Advise she will need to let all partners know that she has been treated and tested positive for trichomoniasis today.  Has been given outpatient resources for dentist.  Strict return precautions have been discussed with patient.  She is in agreement with plan at this time and stable for discharge home.   This note was prepared using Dragon voice recognition software and may include unintentional dictation errors due to the inherent limitations of voice recognition software.       Final Clinical Impressions(s) / ED Diagnoses   Final diagnoses:  Pain, dental  Trichomonas vaginitis    ED Discharge Orders  Ordered    penicillin v potassium (VEETID) 500 MG tablet  4 times daily     05/15/19 1701           Tanda Rockers, PA-C 05/15/19 1754    Milagros Loll, MD 05/16/19 [phone]

## 2019-05-15 NOTE — Discharge Instructions (Signed)
You tested positive for trichomonas today. We have treated you for this as well as empirically treated for gonorrhea and chlamydia You will receive a call in 2-3 days if you test positive for gonorrhea, chlamydia, HIV, or syphilis You will need to let your partners know that they need to be tested and treated as well  Please follow up with a dentist regarding your dental pain - attached is a resource guide for other dentists in the area. You can also attempt to follow up with the [facility] may take Ibuprofen and Tylenol as needed for the pain. I have prescribed antibiotics as well. Please take as prescribed.

## 2019-05-15 NOTE — ED Triage Notes (Signed)
Patient c/o left lower dental pain. Patient states she was seen 1 month ago for the same tooth and received an antibiotic.  Vaginal odor, itching yellow vaginal discharge and burning x 2 weeks.

## 2019-05-16 LAB — GC/CHLAMYDIA PROBE AMP (~~LOC~~) NOT AT ARMC
Chlamydia: NEGATIVE
Neisseria Gonorrhea: NEGATIVE

## 2019-05-16 LAB — RPR: RPR Ser Ql: NONREACTIVE

## 2019-06-28 ENCOUNTER — Emergency Department (HOSPITAL_COMMUNITY)
Admission: EM | Admit: 2019-06-28 | Discharge: 2019-06-28 | Disposition: A | Discharge status: Home / Self Care | Payer: Self-pay | Attending: Emergency Medicine | Admitting: Emergency Medicine

## 2019-06-28 ENCOUNTER — Other Ambulatory Visit: Payer: Self-pay

## 2019-06-28 DIAGNOSIS — F1210 Cannabis abuse, uncomplicated: Secondary | ICD-10-CM | POA: Insufficient documentation

## 2019-06-28 DIAGNOSIS — F1410 Cocaine abuse, uncomplicated: Secondary | ICD-10-CM | POA: Insufficient documentation

## 2019-06-28 DIAGNOSIS — Z79899 Other long term (current) drug therapy: Secondary | ICD-10-CM | POA: Insufficient documentation

## 2019-06-28 DIAGNOSIS — F17210 Nicotine dependence, cigarettes, uncomplicated: Secondary | ICD-10-CM | POA: Insufficient documentation

## 2019-06-28 DIAGNOSIS — K0889 Other specified disorders of teeth and supporting structures: Secondary | ICD-10-CM | POA: Insufficient documentation

## 2019-06-28 DIAGNOSIS — N760 Acute vaginitis: Secondary | ICD-10-CM | POA: Insufficient documentation

## 2019-06-28 DIAGNOSIS — B9689 Other specified bacterial agents as the cause of diseases classified elsewhere: Secondary | ICD-10-CM | POA: Insufficient documentation

## 2019-06-28 LAB — URINALYSIS, ROUTINE W REFLEX MICROSCOPIC
Bilirubin Urine: NEGATIVE
Glucose, UA: NEGATIVE mg/dL
Hgb urine dipstick: NEGATIVE
Ketones, ur: NEGATIVE mg/dL
Leukocytes,Ua: NEGATIVE
Nitrite: NEGATIVE
Protein, ur: NEGATIVE mg/dL
Specific Gravity, Urine: 1.005 (ref 1.005–1.030)
pH: 5 (ref 5.0–8.0)

## 2019-06-28 LAB — WET PREP, GENITAL
Sperm: NONE SEEN
Trich, Wet Prep: NONE SEEN
Yeast Wet Prep HPF POC: NONE SEEN

## 2019-06-28 MED ORDER — IBUPROFEN 800 MG PO TABS
800.0000 mg | ORAL_TABLET | Freq: Once | ORAL | Status: AC
  Administered 2019-06-28: 800 mg via ORAL
  Filled 2019-06-28: qty 1

## 2019-06-28 MED ORDER — PENICILLIN V POTASSIUM 500 MG PO TABS: 500.0000 mg | ORAL_TABLET | Freq: Four times a day (QID) | ORAL | 0 refills | Status: AC

## 2019-06-28 MED ORDER — METRONIDAZOLE 500 MG PO TABS: 500.0000 mg | ORAL_TABLET | Freq: Two times a day (BID) | ORAL | 0 refills | Status: DC

## 2019-06-28 NOTE — Discharge Instructions (Addendum)
[facility] [place] [facility] 4PM-7:30PM Monday-Wednesday  Take Flagyl as prescribed for bacterial vaginosis. Take penicillin as prescribed for your dental infection. Referral given for dentist on call today, call to schedule an appointment.  We will also give resource list for area dentist. Also given information for the walk-in women's clinic, this is available Monday to Wednesday starting at 4 PM.

## 2019-06-28 NOTE — ED Triage Notes (Signed)
Pt arrived POV CC vaginal DC and dental pain. Pt reports being seen for dental pain previously but still can not find a dentist. Pt ambulatory and speaking in complete sentences.

## 2019-06-28 NOTE — ED Notes (Addendum)
Wet prep assemble and pt instructed to self collect. Pt made aware need urine sample

## 2019-06-28 NOTE — ED Provider Notes (Signed)
[facility]-EMERGENCY DEPT Provider Note   CSN: [id number] Arrival date & time: 06/28/19  5621     History   Chief Complaint Chief Complaint  Patient presents with  . Dental Pain  . Vaginal Discharge    HPI Susan [name] is a 43 y.o. female.     43 year old female presents emergency room with ongoing left lower dental pain and vaginal discharge.  Patient was last seen in the emergency room 1 month ago for same, states she has been unable to see a dentist, states that she has not had intercourse since her last visit when she was treated with 2 g of Flagyl, 1 g of Zithromax and 250 mg of Rocephin.  Patient's wet prep was positive for trichomoniasis at that time, gonorrhea and Chlamydia testing was negative.  Patient states he is now having painful urination.  Denies fevers, chills, nausea, vomiting.  No other complaints or concerns.     Past Medical History:  Diagnosis Date  . Alcohol abuse   . Asthma   . BV (bacterial vaginosis) 03/06/2013  . Chronic dental pain   . Chronic pelvic pain in female   . Depression   . Ectopic pregnancy   . GERD (gastroesophageal reflux disease)   . Panic attacks   . Polysubstance abuse (HCC)    cocaine, opiates, marijuana  . Trichomonas vaginitis     Patient Active Problem List   Diagnosis Date Noted  . MDD (major depressive disorder), recurrent episode, severe (HCC) 01/23/2019  . PTSD (post-traumatic stress disorder) 06/07/2017  . Substance induced mood disorder (HCC) 06/04/2017  . Preterm labor 04/10/2017  . Asymptomatic bacteriuria during pregnancy in second trimester 03/14/2017  . Late prenatal care in second trimester 03/08/2017  . Supervision of normal pregnancy 03/08/2017  . MDD (major depressive disorder), single episode, severe with psychotic features (HCC) 01/05/2017  . Alcohol use disorder, severe, dependence (HCC) 01/05/2017  . Cocaine use disorder, moderate, dependence (HCC) 01/05/2017  . Cannabis use  disorder, moderate, dependence (HCC) 01/05/2017  . Heavy smoker 02/03/2016  . Cocaine abuse affecting pregnancy, antepartum (HCC) 02/03/2016  . Chronic, continuous use of opioids 03/24/2015  . Depression with anxiety 03/24/2015  . Poor dentition 03/24/2015  . Sprain of ankle, unspecified site 05/28/2009    Past Surgical History:  Procedure Laterality Date  . ECTOPIC PREGNANCY SURGERY       OB History    Gravida  9   Para  5   Term  4   Preterm  1   AB  4   Living  5     SAB  1   TAB  2   Ectopic  1   Multiple  0   Live Births  5            Home Medications    Prior to Admission medications   Medication Sig Start Date End Date Taking? Authorizing Provider  ibuprofen (ADVIL) 200 MG tablet Take 400 mg by mouth every 6 (six) hours as needed for fever, headache or moderate pain.   Yes [provider]  amoxicillin (AMOXIL) 500 MG capsule Take 1 capsule (500 mg total) by mouth 3 (three) times daily. Patient not taking: Reported on 05/15/2019 04/12/19   Gwyneth Sprout, MD  doxycycline (VIBRA-TABS) 100 MG tablet Take 1 tablet (100 mg total) by mouth every 12 (twelve) hours. Patient not taking: Reported on 04/12/2019 01/26/19   Malvin Johns, MD  FLUoxetine (PROZAC) 20 MG capsule Take 1 capsule (  20 mg total) by mouth daily. Patient not taking: Reported on 05/15/2019 01/27/19   Johnn Hai, MD  gabapentin (NEURONTIN) 400 MG capsule Take 1 capsule (400 mg total) by mouth 3 (three) times daily. Patient not taking: Reported on 04/12/2019 01/26/19   Johnn Hai, MD  metroNIDAZOLE (FLAGYL) 500 MG tablet Take 1 tablet (500 mg total) by mouth 2 (two) times daily. 06/28/19   Tacy Learn, PA-C  naproxen (NAPROSYN) 500 MG tablet Take 1 tablet (500 mg total) by mouth 2 (two) times daily. Patient not taking: Reported on 05/15/2019 04/12/19   Blanchie Dessert, MD  penicillin v potassium (VEETID) 500 MG tablet Take 1 tablet (500 mg total) by mouth 4 (four) times daily for 10  days. 06/28/19 07/08/19  Tacy Learn, PA-C  risperiDONE (RISPERDAL) 2 MG tablet Take 1 tablet (2 mg total) by mouth at bedtime. Patient not taking: Reported on 04/12/2019 01/26/19   Johnn Hai, MD  traZODone (DESYREL) 100 MG tablet Take 2 tablets (200 mg total) by mouth at bedtime. Patient not taking: Reported on 05/15/2019 01/26/19   Johnn Hai, MD    Family History Family History  Problem Relation Age of Onset  . COPD Mother   . Stroke Mother   . Heart disease Maternal Grandmother   . Hypertension Maternal Grandmother   . Stroke Maternal Grandfather   . Asthma Sister   . Down syndrome Sister     Social History Social History   Tobacco Use  . Smoking status: Current Every Day Smoker    Packs/day: 1.00    Years: 26.00    Pack years: 26.00    Types: Cigarettes  . Smokeless tobacco: Never Used  Substance Use Topics  . Alcohol use: Yes    Alcohol/week: 3.0 - 4.0 standard drinks    Types: 3 - 4 Cans of beer per week    Comment: drinks about a 40oz about once a week.   . Drug use: Yes    Types: Marijuana, Cocaine     Allergies   Patient has no known allergies.   Review of Systems Review of Systems  Constitutional: Negative for chills and fever.  HENT: Positive for dental problem. Negative for facial swelling, trouble swallowing and voice change.   Gastrointestinal: Negative for abdominal pain, diarrhea, nausea and vomiting.  Genitourinary: Positive for dysuria and vaginal discharge. Negative for pelvic pain and vaginal pain.  Musculoskeletal: Negative for neck pain and neck stiffness.  Skin: Negative for rash and wound.  Allergic/Immunologic: Negative for immunocompromised state.  Neurological: Negative for headaches.  Hematological: Negative for adenopathy.  Psychiatric/Behavioral: Negative for confusion.  All other systems reviewed and are negative.    Physical Exam Updated Vital Signs BP 126/72   Pulse 72   Temp 98.6 F (37 C) (Oral)   Resp 18   Ht  5\' 1"  (1.549 m)   Wt 68 kg   LMP 05/24/2019 (Exact Date)   SpO2 100%   BMI 28.34 kg/m   Physical Exam Vitals signs and nursing note reviewed.  Constitutional:      General: She is not in acute distress.    Appearance: She is well-developed. She is not diaphoretic.  HENT:     Head: Normocephalic and atraumatic.     Jaw: No trismus.     Mouth/Throat:   Cardiovascular:     Rate and Rhythm: Normal rate and regular rhythm.     Pulses: Normal pulses.     Heart sounds: Normal heart sounds.  Pulmonary:  Effort: Pulmonary effort is normal.     Breath sounds: Normal breath sounds.  Abdominal:     Palpations: Abdomen is soft.     Tenderness: There is no abdominal tenderness. There is no right CVA tenderness or left CVA tenderness.  Lymphadenopathy:     Cervical: No cervical adenopathy.  Skin:    General: Skin is warm and dry.     Findings: No erythema or rash.  Neurological:     Mental Status: She is alert and oriented to person, place, and time.  Psychiatric:        Behavior: Behavior normal.      ED Treatments / Results  Labs (all labs ordered are listed, but only abnormal results are displayed) Labs Reviewed  WET PREP, GENITAL - Abnormal; Notable for the following components:      Result Value   Clue Cells Wet Prep HPF POC PRESENT (*)    WBC, Wet Prep HPF POC FEW (*)    All other components within normal limits  URINALYSIS, ROUTINE W REFLEX MICROSCOPIC - Abnormal; Notable for the following components:   Color, Urine STRAW (*)    All other components within normal limits    EKG None  Radiology No results found.  Procedures Procedures (including critical care time)  Medications Ordered in ED Medications  ibuprofen (ADVIL) tablet 800 mg (800 mg Oral Given 06/28/19 0950)     Initial Impression / Assessment and Plan / ED Course  I have reviewed the triage vital signs and the nursing notes.  Pertinent labs & imaging results that were available during my  care of the patient were reviewed by me and considered in my medical decision making (see chart for details).  Clinical Course as of Jun 27 1010  Thu Jun 28, 2019  [id number] 43 year old female with complaint of ongoing left lower dental pain, states she is unable to see a dentist, denies trauma, fever, drainage.  Also reports ongoing vaginal discharge.  Patient was seen in the emergency room 1 month ago, had pelvic ultrasound that was negative for ectopic or TOA, negative gonorrhea chlamydia swab, was positive for trichomoniasis.  Patient was treated with Rocephin, Zithromax, Flagyl.  Patient states that she has abstained since that visit yet continues to have ongoing discharge.  Also reports having dysuria at this time.  On exam, abdomen is soft and nontender, no CVA tenderness.  Patient does have a large cavity in the left lower tooth without surrounding abscess.  Plan is for patient to self swab wet prep.  She will be referred to dentist on-call as well as given resource list again and discussed resources including women's health follow-up.   [LM]  1010 Right prep is positive for clue cells, patient be treated with Flagyl.  Urinalysis negative for UTI.   [LM]    Clinical Course User Index [LM] Jeannie FendMurphy, Sherika Kubicki A, PA-C      Final Clinical Impressions(s) / ED Diagnoses   Final diagnoses:  Pain, dental  BV (bacterial vaginosis)    ED Discharge Orders         Ordered    metroNIDAZOLE (FLAGYL) 500 MG tablet  2 times daily     06/28/19 1009    penicillin v potassium (VEETID) 500 MG tablet  4 times daily     06/28/19 1009           Alden HippMurphy, Mikaiya Tramble A, PA-C 06/28/19 1011    Mancel BaleWentz, Elliott, MD 06/29/19 1317

## 2019-07-17 ENCOUNTER — Encounter (HOSPITAL_COMMUNITY): Payer: Self-pay | Admitting: Emergency Medicine

## 2019-07-17 ENCOUNTER — Emergency Department (HOSPITAL_COMMUNITY)
Admission: EM | Admit: 2019-07-17 | Discharge: 2019-07-17 | Disposition: A | Discharge status: Home / Self Care | Payer: Self-pay | Attending: Emergency Medicine | Admitting: Emergency Medicine

## 2019-07-17 DIAGNOSIS — Z5321 Procedure and treatment not carried out due to patient leaving prior to being seen by health care provider: Secondary | ICD-10-CM | POA: Insufficient documentation

## 2019-07-17 DIAGNOSIS — F10129 Alcohol abuse with intoxication, unspecified: Secondary | ICD-10-CM | POA: Insufficient documentation

## 2019-07-17 NOTE — ED Triage Notes (Signed)
Patient here via EMS. Picked up by [employer] but "too drunk for drunk tank". ETOH.

## 2019-07-17 NOTE — ED Notes (Signed)
Pt requested to not be woken up, only demanding a place to sleep. When taken to the lobby, pt began cussing and yelling things like "fuck you mother fuckers". Pt informed that language would not be tolerated, and she needed to stop yelling. Pt said, "ok I'll be quiet".

## 2019-07-17 NOTE — ED Notes (Signed)
Patient belligerent and cursing out in lobby. Yelling at other patients. Security called, escorted off property.

## 2019-07-17 NOTE — ED Notes (Signed)
Pt not seen in lobby when called for triage.

## 2019-07-17 NOTE — ED Notes (Signed)
Patient requesting to "sleep". Given blanket.

## 2019-07-26 ENCOUNTER — Observation Stay (HOSPITAL_COMMUNITY)
Admission: AD | Admit: 2019-07-26 | Discharge: 2019-07-27 | Disposition: A | Discharge status: Other Acute Inpatient Hospital | Payer: Federal, State, Local not specified - Other | Source: Intra-hospital | Attending: Psychiatry | Admitting: Psychiatry

## 2019-07-26 ENCOUNTER — Encounter (HOSPITAL_COMMUNITY): Payer: Self-pay

## 2019-07-26 ENCOUNTER — Other Ambulatory Visit: Payer: Self-pay

## 2019-07-26 ENCOUNTER — Emergency Department (HOSPITAL_COMMUNITY)
Admission: EM | Admit: 2019-07-26 | Discharge: 2019-07-26 | Disposition: A | Discharge status: Other Acute Inpatient Hospital | Payer: Self-pay | Attending: Emergency Medicine | Admitting: Emergency Medicine

## 2019-07-26 ENCOUNTER — Encounter (HOSPITAL_COMMUNITY): Payer: Self-pay | Admitting: Psychiatry

## 2019-07-26 DIAGNOSIS — R441 Visual hallucinations: Secondary | ICD-10-CM | POA: Insufficient documentation

## 2019-07-26 DIAGNOSIS — F1910 Other psychoactive substance abuse, uncomplicated: Secondary | ICD-10-CM | POA: Insufficient documentation

## 2019-07-26 DIAGNOSIS — J45909 Unspecified asthma, uncomplicated: Secondary | ICD-10-CM | POA: Insufficient documentation

## 2019-07-26 DIAGNOSIS — F319 Bipolar disorder, unspecified: Principal | ICD-10-CM | POA: Insufficient documentation

## 2019-07-26 DIAGNOSIS — Z79899 Other long term (current) drug therapy: Secondary | ICD-10-CM | POA: Insufficient documentation

## 2019-07-26 DIAGNOSIS — F332 Major depressive disorder, recurrent severe without psychotic features: Secondary | ICD-10-CM | POA: Diagnosis present

## 2019-07-26 DIAGNOSIS — R440 Auditory hallucinations: Secondary | ICD-10-CM | POA: Insufficient documentation

## 2019-07-26 DIAGNOSIS — R45851 Suicidal ideations: Secondary | ICD-10-CM | POA: Insufficient documentation

## 2019-07-26 DIAGNOSIS — F1920 Other psychoactive substance dependence, uncomplicated: Secondary | ICD-10-CM | POA: Diagnosis present

## 2019-07-26 DIAGNOSIS — Z046 Encounter for general psychiatric examination, requested by authority: Secondary | ICD-10-CM | POA: Insufficient documentation

## 2019-07-26 DIAGNOSIS — Z20828 Contact with and (suspected) exposure to other viral communicable diseases: Secondary | ICD-10-CM | POA: Insufficient documentation

## 2019-07-26 DIAGNOSIS — F329 Major depressive disorder, single episode, unspecified: Secondary | ICD-10-CM | POA: Insufficient documentation

## 2019-07-26 DIAGNOSIS — F17210 Nicotine dependence, cigarettes, uncomplicated: Secondary | ICD-10-CM | POA: Insufficient documentation

## 2019-07-26 LAB — CBC
HCT: 45.1 % (ref 36.0–46.0)
Hemoglobin: 14.9 g/dL (ref 12.0–15.0)
MCH: 32.3 pg (ref 26.0–34.0)
MCHC: 33 g/dL (ref 30.0–36.0)
MCV: 97.6 fL (ref 80.0–100.0)
Platelets: 249 10*3/uL (ref 150–400)
RBC: 4.62 MIL/uL (ref 3.87–5.11)
RDW: 13.3 % (ref 11.5–15.5)
WBC: 17.6 10*3/uL — ABNORMAL HIGH (ref 4.0–10.5)
nRBC: 0 % (ref 0.0–0.2)

## 2019-07-26 LAB — COMPREHENSIVE METABOLIC PANEL
ALT: 18 U/L (ref 0–44)
AST: 21 U/L (ref 15–41)
Albumin: 4.1 g/dL (ref 3.5–5.0)
Alkaline Phosphatase: 68 U/L (ref 38–126)
Anion gap: 14 (ref 5–15)
BUN: 10 mg/dL (ref 6–20)
CO2: 22 mmol/L (ref 22–32)
Calcium: 9 mg/dL (ref 8.9–10.3)
Chloride: 101 mmol/L (ref 98–111)
Creatinine, Ser: 0.61 mg/dL (ref 0.44–1.00)
GFR calc Af Amer: >60 mL/min (ref >60)
GFR calc non Af Amer: >60 mL/min (ref >60)
Glucose, Bld: 131 mg/dL — ABNORMAL HIGH (ref 70–99)
Potassium: 4 mmol/L (ref 3.5–5.1)
Sodium: 137 mmol/L (ref 135–145)
Total Bilirubin: 0.6 mg/dL (ref 0.3–1.2)
Total Protein: 7.2 g/dL (ref 6.5–8.1)

## 2019-07-26 LAB — URINALYSIS, ROUTINE W REFLEX MICROSCOPIC
Bilirubin Urine: NEGATIVE
Glucose, UA: NEGATIVE mg/dL
Ketones, ur: NEGATIVE mg/dL
Leukocytes,Ua: NEGATIVE
Nitrite: NEGATIVE
Protein, ur: NEGATIVE mg/dL
Specific Gravity, Urine: 1.026 (ref 1.005–1.030)
pH: 5 (ref 5.0–8.0)

## 2019-07-26 LAB — RAPID URINE DRUG SCREEN, HOSP PERFORMED
Amphetamines: NOT DETECTED
Barbiturates: NOT DETECTED
Benzodiazepines: NOT DETECTED
Cocaine: POSITIVE — AB
Opiates: POSITIVE — AB
Tetrahydrocannabinol: POSITIVE — AB

## 2019-07-26 LAB — ETHANOL: Alcohol, Ethyl (B): <10 mg/dL (ref <10)

## 2019-07-26 LAB — POC SARS CORONAVIRUS 2 AG -  ED: SARS Coronavirus 2 Ag: NEGATIVE

## 2019-07-26 LAB — SARS CORONAVIRUS 2 (TAT 6-24 HRS): SARS Coronavirus 2: NEGATIVE

## 2019-07-26 LAB — ACETAMINOPHEN LEVEL: Acetaminophen (Tylenol), Serum: <10 ug/mL — ABNORMAL LOW (ref 10–30)

## 2019-07-26 LAB — I-STAT BETA HCG BLOOD, ED (MC, WL, AP ONLY): I-stat hCG, quantitative: <5 m[IU]/mL (ref <5)

## 2019-07-26 LAB — SALICYLATE LEVEL: Salicylate Lvl: <7 mg/dL (ref 2.8–30.0)

## 2019-07-26 MED ORDER — FLUOXETINE HCL 20 MG PO CAPS
20.0000 mg | ORAL_CAPSULE | Freq: Every day | ORAL | Status: DC
  Administered 2019-07-26: 20 mg via ORAL
  Filled 2019-07-26: qty 1

## 2019-07-26 MED ORDER — ACETAMINOPHEN 325 MG PO TABS: 650.0000 mg | ORAL_TABLET | Freq: Four times a day (QID) | ORAL | Status: DC | PRN

## 2019-07-26 MED ORDER — TRAZODONE HCL 100 MG PO TABS: 200.0000 mg | ORAL_TABLET | Freq: Every day | ORAL | Status: DC

## 2019-07-26 MED ORDER — ACYCLOVIR 5 % EX OINT
TOPICAL_OINTMENT | Freq: Four times a day (QID) | CUTANEOUS | Status: DC
  Filled 2019-07-26 (×2): qty 15

## 2019-07-26 MED ORDER — METHOCARBAMOL 500 MG PO TABS: 500.0000 mg | ORAL_TABLET | Freq: Three times a day (TID) | ORAL | Status: DC | PRN

## 2019-07-26 MED ORDER — MAGNESIUM HYDROXIDE 400 MG/5ML PO SUSP: 30.0000 mL | Freq: Every day | ORAL | Status: DC | PRN

## 2019-07-26 MED ORDER — LORAZEPAM 1 MG PO TABS
1.0000 mg | ORAL_TABLET | Freq: Once | ORAL | Status: AC
  Administered 2019-07-26: 1 mg via ORAL
  Filled 2019-07-26: qty 1

## 2019-07-26 MED ORDER — ACYCLOVIR 5 % EX OINT
TOPICAL_OINTMENT | Freq: Four times a day (QID) | CUTANEOUS | Status: DC
  Filled 2019-07-26: qty 15

## 2019-07-26 MED ORDER — DICYCLOMINE HCL 20 MG PO TABS: 20.0000 mg | ORAL_TABLET | Freq: Four times a day (QID) | ORAL | Status: DC | PRN

## 2019-07-26 MED ORDER — GABAPENTIN 400 MG PO CAPS
400.0000 mg | ORAL_CAPSULE | Freq: Three times a day (TID) | ORAL | Status: DC
  Administered 2019-07-27 (×2): 400 mg via ORAL
  Filled 2019-07-26 (×2): qty 1

## 2019-07-26 MED ORDER — NAPROXEN 250 MG PO TABS
500.0000 mg | ORAL_TABLET | Freq: Two times a day (BID) | ORAL | Status: DC
  Administered 2019-07-26 – 2019-07-27 (×2): 500 mg via ORAL
  Filled 2019-07-26 (×2): qty 2

## 2019-07-26 MED ORDER — TRAZODONE HCL 100 MG PO TABS
200.0000 mg | ORAL_TABLET | Freq: Every day | ORAL | Status: DC
  Administered 2019-07-26: 200 mg via ORAL
  Filled 2019-07-26: qty 2

## 2019-07-26 MED ORDER — NAPROXEN 250 MG PO TABS: 500.0000 mg | ORAL_TABLET | Freq: Two times a day (BID) | ORAL | Status: DC | PRN

## 2019-07-26 MED ORDER — ONDANSETRON 4 MG PO TBDP: 4.0000 mg | ORAL_TABLET | Freq: Four times a day (QID) | ORAL | Status: DC | PRN

## 2019-07-26 MED ORDER — CLONIDINE HCL 0.1 MG PO TABS: 0.1000 mg | ORAL_TABLET | ORAL | Status: DC

## 2019-07-26 MED ORDER — CLONIDINE HCL 0.1 MG PO TABS
0.1000 mg | ORAL_TABLET | Freq: Four times a day (QID) | ORAL | Status: DC
  Administered 2019-07-26: 0.1 mg via ORAL
  Filled 2019-07-26: qty 1

## 2019-07-26 MED ORDER — LOPERAMIDE HCL 2 MG PO CAPS: 2.0000 mg | ORAL_CAPSULE | ORAL | Status: DC | PRN

## 2019-07-26 MED ORDER — CLONIDINE HCL 0.1 MG PO TABS: 0.1000 mg | ORAL_TABLET | Freq: Every day | ORAL | Status: DC

## 2019-07-26 MED ORDER — FLUOXETINE HCL 20 MG PO CAPS
20.0000 mg | ORAL_CAPSULE | Freq: Every day | ORAL | Status: DC
  Administered 2019-07-27: 20 mg via ORAL
  Filled 2019-07-26: qty 1

## 2019-07-26 MED ORDER — RISPERIDONE 2 MG PO TABS
2.0000 mg | ORAL_TABLET | Freq: Every day | ORAL | Status: DC
  Administered 2019-07-26: 2 mg via ORAL
  Filled 2019-07-26: qty 1

## 2019-07-26 MED ORDER — ACETAMINOPHEN 325 MG PO TABS
650.0000 mg | ORAL_TABLET | Freq: Once | ORAL | Status: AC
  Administered 2019-07-26: 650 mg via ORAL
  Filled 2019-07-26: qty 2

## 2019-07-26 MED ORDER — IBUPROFEN 200 MG PO TABS: 600.0000 mg | ORAL_TABLET | Freq: Once | ORAL | Status: DC

## 2019-07-26 MED ORDER — ALUM & MAG HYDROXIDE-SIMETH 200-200-20 MG/5ML PO SUSP: 30.0000 mL | ORAL | Status: DC | PRN

## 2019-07-26 MED ORDER — HYDROXYZINE HCL 25 MG PO TABS
25.0000 mg | ORAL_TABLET | Freq: Four times a day (QID) | ORAL | Status: DC | PRN
  Administered 2019-07-27: 25 mg via ORAL
  Filled 2019-07-26: qty 1

## 2019-07-26 NOTE — ED Notes (Addendum)
Pt provided with warm blanket. Pt resting comfortably in bed. NAD noted. Pt is calm at this time.

## 2019-07-26 NOTE — ED Notes (Addendum)
TTS consult completed via virtual screen visit.

## 2019-07-26 NOTE — Progress Notes (Signed)
Patient ID: MELITA VILLALONA, female   DOB: 10-20-1975, 43 y.o.   MRN: [id number] Patient is a 43 year old Caucasian female admitted  on a voluntary basis to the Observation unit, and came from [facility] ED endorses +SI, denies having a current plan, but states that her plan prior to going to [facility] was to cut her wrist, and reports a history of cutting, with the last time being a couple of weeks ago.  Pt observed to have superficial cuts to her left forearm.    Pt reports a history of alcohol abuse, states she drinks a 40oz of liquor daily, states that last time she drank was two days ago.  She reports that she also uses cocaine daily, unable to quantify this, and denies using marijuana, even though her UDS showed this as positive.    Pt reports her highest level of education as being 8th grade, states her mother is her only support system, and reports being currently homeless.  She also reports a history of +AH of voices telling her to harm herself, and states that she sees visions of different people sometimes.  Pt calm and cooperative with admission process, verbally contracts for safety on the unit, pt oriented to unit and unit protocols, Q15 minute checks initiated.
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
## 2019-07-26 NOTE — [facility] Counselor (Signed)
Disposition: Letitia Libra, FNP recommends patient be transferred to OBs pending a negative covid test. Tonette Bihari informed of disposition.

## 2019-07-26 NOTE — ED Provider Notes (Signed)
[facility]-EMERGENCY DEPT Provider Note   CSN: [id number] Arrival date & time: 07/26/19  [id number]     History Chief Complaint  Patient presents with  . Suicidal    Susan [name] is a 43 y.o. female with past medical history of polysubstance abuse, depression, anxiety, PTSD, presenting to the emergency department with complaint of depression and suicidal ideation.  Patient reports symptoms have been going on for "a long time."  She expresses feelings of wanting to harm herself by cutting which she has done in the past.  She does not have any current wounds.  She reports she has had thoughts of harming others in the past, however no current thoughts of doing so.  She also endorses auditory and visual hallucinations.  The auditory hallucinations are people telling her that "people are out to get me."  Pt reports she has been out of her prozac and risperidone for 1 month. She endorses recent cocaine and alcohol use about 1 day ago.  No history of seizures from alcohol withdrawal.  No medical complaints. Pt presenting voluntarily. The history is provided by the patient.       Past Medical History:  Diagnosis Date  . Alcohol abuse   . Asthma   . BV (bacterial vaginosis) 03/06/2013  . Chronic dental pain   . Chronic pelvic pain in female   . Depression   . Ectopic pregnancy   . GERD (gastroesophageal reflux disease)   . Panic attacks   . Polysubstance abuse (HCC)    cocaine, opiates, marijuana  . Trichomonas vaginitis     Patient Active Problem List   Diagnosis Date Noted  . MDD (major depressive disorder), recurrent episode, severe (HCC) 01/23/2019  . PTSD (post-traumatic stress disorder) 06/07/2017  . Substance induced mood disorder (HCC) 06/04/2017  . Preterm labor 04/10/2017  . Asymptomatic bacteriuria during pregnancy in second trimester 03/14/2017  . Late prenatal care in second trimester 03/08/2017  . Supervision of normal pregnancy 03/08/2017  . MDD  (major depressive disorder), single episode, severe with psychotic features (HCC) 01/05/2017  . Alcohol use disorder, severe, dependence (HCC) 01/05/2017  . Cocaine use disorder, moderate, dependence (HCC) 01/05/2017  . Cannabis use disorder, moderate, dependence (HCC) 01/05/2017  . Heavy smoker 02/03/2016  . Cocaine abuse affecting pregnancy, antepartum (HCC) 02/03/2016  . Chronic, continuous use of opioids 03/24/2015  . Depression with anxiety 03/24/2015  . Poor dentition 03/24/2015  . Sprain of ankle, unspecified site 05/28/2009    Past Surgical History:  Procedure Laterality Date  . ECTOPIC PREGNANCY SURGERY       OB History    Gravida  9   Para  5   Term  4   Preterm  1   AB  4   Living  5     SAB  1   TAB  2   Ectopic  1   Multiple  0   Live Births  5           Family History  Problem Relation Age of Onset  . COPD Mother   . Stroke Mother   . Heart disease Maternal Grandmother   . Hypertension Maternal Grandmother   . Stroke Maternal Grandfather   . Asthma Sister   . Down syndrome Sister     Social History   Tobacco Use  . Smoking status: Current Every Day Smoker    Packs/day: 1.00    Years: 26.00    Pack years: 26.00  Types: Cigarettes  . Smokeless tobacco: Never Used  Substance Use Topics  . Alcohol use: Yes    Alcohol/week: 3.0 - 4.0 standard drinks    Types: 3 - 4 Cans of beer per week    Comment: drinks about a 40oz about once a week.   . Drug use: Yes    Types: Marijuana, Cocaine    Home Medications Prior to Admission medications   Medication Sig Start Date End Date Taking? Authorizing Provider  FLUoxetine (PROZAC) 20 MG capsule Take 1 capsule (20 mg total) by mouth daily. 01/27/19  Yes Johnn Hai, MD  traZODone (DESYREL) 100 MG tablet Take 2 tablets (200 mg total) by mouth at bedtime. 01/26/19  Yes Johnn Hai, MD  gabapentin (NEURONTIN) 400 MG capsule Take 1 capsule (400 mg total) by mouth 3 (three) times daily. Patient  not taking: Reported on 04/12/2019 01/26/19   Johnn Hai, MD  naproxen (NAPROSYN) 500 MG tablet Take 1 tablet (500 mg total) by mouth 2 (two) times daily. Patient not taking: Reported on 05/15/2019 04/12/19   Blanchie Dessert, MD  risperiDONE (RISPERDAL) 2 MG tablet Take 1 tablet (2 mg total) by mouth at bedtime. Patient not taking: Reported on 04/12/2019 01/26/19   Johnn Hai, MD    Allergies    Patient has no known allergies.  Review of Systems   Review of Systems  Psychiatric/Behavioral: Positive for dysphoric mood, hallucinations and suicidal ideas.  All other systems reviewed and are negative.   Physical Exam Updated Vital Signs BP 128/84 (BP Location: Left Arm)   Pulse 93   Temp 98.2 F (36.8 C) (Oral)   Resp (!) 22   SpO2 96%   Physical Exam Vitals and nursing note reviewed.  Constitutional:      General: She is not in acute distress.    Appearance: She is well-developed.  HENT:     Head: Normocephalic and atraumatic.  Eyes:     Conjunctiva/sclera: Conjunctivae normal.  Cardiovascular:     Rate and Rhythm: Normal rate and regular rhythm.  Pulmonary:     Effort: Pulmonary effort is normal.     Breath sounds: Normal breath sounds.  Abdominal:     Palpations: Abdomen is soft.  Skin:    General: Skin is warm.  Neurological:     Mental Status: She is alert.  Psychiatric:        Attention and Perception: Attention normal.        Mood and Affect: Mood normal. Affect is flat.        Speech: Speech normal.        Behavior: Behavior normal. Behavior is cooperative.        Thought Content: Thought content includes suicidal ideation. Thought content does not include homicidal ideation. Thought content does not include suicidal plan.     Comments: Pt does not appear to be responding to internal stimuli     ED Results / Procedures / Treatments   Labs (all labs ordered are listed, but only abnormal results are displayed) Labs Reviewed  COMPREHENSIVE METABOLIC PANEL -  Abnormal; Notable for the following components:      Result Value   Glucose, Bld 131 (*)    All other components within normal limits  ACETAMINOPHEN LEVEL - Abnormal; Notable for the following components:   Acetaminophen (Tylenol), Serum <10 (*)    All other components within normal limits  CBC - Abnormal; Notable for the following components:   WBC 17.6 (*)    All other  components within normal limits  RAPID URINE DRUG SCREEN, HOSP PERFORMED - Abnormal; Notable for the following components:   Opiates POSITIVE (*)    Cocaine POSITIVE (*)    Tetrahydrocannabinol POSITIVE (*)    All other components within normal limits  URINALYSIS, ROUTINE W REFLEX MICROSCOPIC - Abnormal; Notable for the following components:   Hgb urine dipstick SMALL (*)    Bacteria, UA RARE (*)    All other components within normal limits  ETHANOL  SALICYLATE LEVEL  I-STAT BETA HCG BLOOD, ED ([facility], [facility], [facility] ONLY)  POC SARS CORONAVIRUS 2 AG -  ED    EKG None  Radiology No results found.  Procedures Procedures (including critical care time)  Medications Ordered in ED Medications  acyclovir ointment (ZOVIRAX) 5 % (has no administration in time range)    ED Course  I have reviewed the triage vital signs and the nursing notes.  Pertinent labs & imaging results that were available during my care of the patient were reviewed by me and considered in my medical decision making (see chart for details).    MDM Rules/Calculators/A&P                      Patient presenting to the ED voluntarily with suicidal ideation and depression.  History of substance abuse, UDS is positive.  No medical complaints.  There is a leukocytosis present, however UA is negative and no infectious symptoms.  Patient is medically cleared for TTS evaluation.  TTS recommending overnight observation. Final Clinical Impression(s) / ED Diagnoses Final diagnoses:  Suicidal ideation  Polysubstance abuse [facility])    Rx / DC Orders ED  Discharge Orders    None       Luana Tatro, [place] N, PA-C 07/26/19 1345    Donnetta Hutching, MD 07/28/19 1114

## 2019-07-26 NOTE — Progress Notes (Signed)
Received Susan [name] asleep in her bed asleep with the sitter at the bedside.She endorsed feeling depressed and anxious. Report was given to nurse Tamela Oddi at [facility] and pt was transported at  2125 hrs   by [place]  without incident.

## 2019-07-26 NOTE — ED Notes (Signed)
Pt resting in hall bed. NAD noted.

## 2019-07-26 NOTE — ED Triage Notes (Signed)
Pt coming in c/o of SI. Stated she has been really depressed lately and has been out of her medication for a month. No current plan. No other complaints.

## 2019-07-26 NOTE — ED Notes (Signed)
Pt. In burgundy scrubs and wanded by security. Pt. Has 1 belongings bag and 1 black bookbag. Pt. Belongings on the floor in the 16-18 zone.

## 2019-07-26 NOTE — BH Assessment (Signed)
[place] Assessment Progress Note  Per Letitia Libra, FNP, this pt requires psychiatric hospitalization at this time.  Heather, RN has pre-assigned pt to [facility] Rm 405-1 pending a negative Covid-19 result.  Pt has signed Voluntary Admission and Consent for Treatment, as well as Consent to Release Information to her mother, and signed forms have been faxed to [facility]  Pt's nurse, Melody, has been notified, and agrees to send original paperwork along with pt via [facility] [employer], and to call report to [phone] when the time comes.  Jalene Mullet, [place] Coordinator [phone]

## 2019-07-27 ENCOUNTER — Encounter (HOSPITAL_COMMUNITY): Payer: Self-pay | Admitting: Psychiatry

## 2019-07-27 ENCOUNTER — Inpatient Hospital Stay (HOSPITAL_COMMUNITY)
Admission: AD | Admit: 2019-07-27 | Discharge: 2019-07-31 | DRG: 897 | Disposition: A | Discharge status: Home / Self Care | Payer: Federal, State, Local not specified - Other | Source: Intra-hospital | Attending: Psychiatry | Admitting: Psychiatry

## 2019-07-27 ENCOUNTER — Other Ambulatory Visit: Payer: Self-pay

## 2019-07-27 DIAGNOSIS — R45851 Suicidal ideations: Secondary | ICD-10-CM | POA: Diagnosis present

## 2019-07-27 DIAGNOSIS — F1920 Other psychoactive substance dependence, uncomplicated: Secondary | ICD-10-CM

## 2019-07-27 DIAGNOSIS — F1994 Other psychoactive substance use, unspecified with psychoactive substance-induced mood disorder: Secondary | ICD-10-CM | POA: Diagnosis present

## 2019-07-27 DIAGNOSIS — A6000 Herpesviral infection of urogenital system, unspecified: Secondary | ICD-10-CM | POA: Diagnosis present

## 2019-07-27 DIAGNOSIS — F332 Major depressive disorder, recurrent severe without psychotic features: Secondary | ICD-10-CM | POA: Diagnosis present

## 2019-07-27 DIAGNOSIS — F1424 Cocaine dependence with cocaine-induced mood disorder: Secondary | ICD-10-CM | POA: Diagnosis present

## 2019-07-27 DIAGNOSIS — F17210 Nicotine dependence, cigarettes, uncomplicated: Secondary | ICD-10-CM | POA: Diagnosis present

## 2019-07-27 DIAGNOSIS — Z20828 Contact with and (suspected) exposure to other viral communicable diseases: Secondary | ICD-10-CM | POA: Diagnosis present

## 2019-07-27 DIAGNOSIS — Z23 Encounter for immunization: Secondary | ICD-10-CM | POA: Diagnosis not present

## 2019-07-27 DIAGNOSIS — F1020 Alcohol dependence, uncomplicated: Secondary | ICD-10-CM | POA: Diagnosis present

## 2019-07-27 DIAGNOSIS — F333 Major depressive disorder, recurrent, severe with psychotic symptoms: Secondary | ICD-10-CM | POA: Diagnosis not present

## 2019-07-27 MED ORDER — FOLIC ACID 1 MG PO TABS
1.0000 mg | ORAL_TABLET | Freq: Every day | ORAL | Status: DC
  Administered 2019-07-27: 1 mg via ORAL
  Filled 2019-07-27: qty 1

## 2019-07-27 MED ORDER — MAGNESIUM HYDROXIDE 400 MG/5ML PO SUSP: 30.0000 mL | Freq: Every day | ORAL | Status: DC | PRN

## 2019-07-27 MED ORDER — LOPERAMIDE HCL 2 MG PO CAPS: 2.0000 mg | ORAL_CAPSULE | ORAL | Status: DC | PRN

## 2019-07-27 MED ORDER — ONDANSETRON 4 MG PO TBDP
4.0000 mg | ORAL_TABLET | Freq: Four times a day (QID) | ORAL | Status: DC | PRN
  Administered 2019-07-29: 4 mg via ORAL
  Filled 2019-07-27: qty 1

## 2019-07-27 MED ORDER — DICYCLOMINE HCL 20 MG PO TABS
20.0000 mg | ORAL_TABLET | Freq: Four times a day (QID) | ORAL | Status: DC | PRN
  Administered 2019-07-28 (×2): 20 mg via ORAL
  Filled 2019-07-27 (×2): qty 1

## 2019-07-27 MED ORDER — LORAZEPAM 1 MG PO TABS: 1.0000 mg | ORAL_TABLET | Freq: Four times a day (QID) | ORAL | Status: DC | PRN

## 2019-07-27 MED ORDER — NICOTINE 21 MG/24HR TD PT24
MEDICATED_PATCH | TRANSDERMAL | Status: AC
  Filled 2019-07-27: qty 2

## 2019-07-27 MED ORDER — NICOTINE 21 MG/24HR TD PT24
21.0000 mg | MEDICATED_PATCH | Freq: Every day | TRANSDERMAL | Status: DC
  Administered 2019-07-27: 21 mg via TRANSDERMAL

## 2019-07-27 MED ORDER — ACYCLOVIR 5 % EX OINT
TOPICAL_OINTMENT | Freq: Four times a day (QID) | CUTANEOUS | Status: DC
  Administered 2019-07-29: 1 via TOPICAL
  Filled 2019-07-27 (×3): qty 15

## 2019-07-27 MED ORDER — THIAMINE HCL 100 MG PO TABS
100.0000 mg | ORAL_TABLET | Freq: Every day | ORAL | Status: DC
  Administered 2019-07-27: 100 mg via ORAL
  Filled 2019-07-27: qty 1

## 2019-07-27 MED ORDER — GABAPENTIN 400 MG PO CAPS
400.0000 mg | ORAL_CAPSULE | Freq: Three times a day (TID) | ORAL | Status: DC
  Administered 2019-07-27 – 2019-07-31 (×10): 400 mg via ORAL
  Filled 2019-07-27 (×4): qty 1
  Filled 2019-07-27: qty 42
  Filled 2019-07-27 (×5): qty 1
  Filled 2019-07-27: qty 42
  Filled 2019-07-27 (×5): qty 1
  Filled 2019-07-27: qty 42

## 2019-07-27 MED ORDER — ACETAMINOPHEN 325 MG PO TABS
650.0000 mg | ORAL_TABLET | Freq: Four times a day (QID) | ORAL | Status: DC | PRN
  Administered 2019-07-27 – 2019-07-31 (×8): 650 mg via ORAL
  Filled 2019-07-27 (×7): qty 2

## 2019-07-27 MED ORDER — METHOCARBAMOL 500 MG PO TABS
500.0000 mg | ORAL_TABLET | Freq: Three times a day (TID) | ORAL | Status: DC | PRN
  Administered 2019-07-27 – 2019-07-29 (×5): 500 mg via ORAL
  Filled 2019-07-27 (×5): qty 1

## 2019-07-27 MED ORDER — NAPROXEN 500 MG PO TABS
500.0000 mg | ORAL_TABLET | Freq: Two times a day (BID) | ORAL | Status: DC
  Administered 2019-07-27 – 2019-07-31 (×8): 500 mg via ORAL
  Filled 2019-07-27: qty 1
  Filled 2019-07-27: qty 28
  Filled 2019-07-27 (×10): qty 1
  Filled 2019-07-27: qty 28

## 2019-07-27 MED ORDER — FOLIC ACID 1 MG PO TABS
1.0000 mg | ORAL_TABLET | Freq: Every day | ORAL | Status: DC
  Administered 2019-07-28 – 2019-07-31 (×4): 1 mg via ORAL
  Filled 2019-07-27 (×6): qty 1

## 2019-07-27 MED ORDER — LORAZEPAM 1 MG PO TABS
1.0000 mg | ORAL_TABLET | Freq: Four times a day (QID) | ORAL | Status: DC | PRN
  Administered 2019-07-28: 1 mg via ORAL
  Filled 2019-07-27 (×2): qty 1

## 2019-07-27 MED ORDER — INFLUENZA VAC SPLIT QUAD 0.5 ML IM SUSY
0.5000 mL | PREFILLED_SYRINGE | INTRAMUSCULAR | Status: AC
  Administered 2019-07-28: 0.5 mL via INTRAMUSCULAR
  Filled 2019-07-27: qty 0.5

## 2019-07-27 MED ORDER — HYDROXYZINE HCL 25 MG PO TABS
25.0000 mg | ORAL_TABLET | Freq: Four times a day (QID) | ORAL | Status: DC | PRN
  Administered 2019-07-28 – 2019-07-30 (×5): 25 mg via ORAL
  Filled 2019-07-27: qty 10
  Filled 2019-07-27 (×5): qty 1

## 2019-07-27 MED ORDER — ALUM & MAG HYDROXIDE-SIMETH 200-200-20 MG/5ML PO SUSP: 30.0000 mL | ORAL | Status: DC | PRN

## 2019-07-27 MED ORDER — FLUOXETINE HCL 20 MG PO CAPS
20.0000 mg | ORAL_CAPSULE | Freq: Every day | ORAL | Status: DC
  Administered 2019-07-28 – 2019-07-29 (×2): 20 mg via ORAL
  Filled 2019-07-27 (×4): qty 1

## 2019-07-27 MED ORDER — RISPERIDONE 2 MG PO TABS
2.0000 mg | ORAL_TABLET | Freq: Every day | ORAL | Status: DC
  Administered 2019-07-27 – 2019-07-30 (×4): 2 mg via ORAL
  Filled 2019-07-27 (×2): qty 1
  Filled 2019-07-27: qty 14
  Filled 2019-07-27 (×4): qty 1

## 2019-07-27 MED ORDER — THIAMINE HCL 100 MG PO TABS
100.0000 mg | ORAL_TABLET | Freq: Every day | ORAL | Status: DC
  Administered 2019-07-28 – 2019-07-31 (×4): 100 mg via ORAL
  Filled 2019-07-27 (×6): qty 1

## 2019-07-27 MED ORDER — PNEUMOCOCCAL VAC POLYVALENT 25 MCG/0.5ML IJ INJ
0.5000 mL | INJECTION | INTRAMUSCULAR | Status: AC
  Administered 2019-07-28: 0.5 mL via INTRAMUSCULAR

## 2019-07-27 MED ORDER — METRONIDAZOLE 500 MG PO TABS
500.0000 mg | ORAL_TABLET | Freq: Two times a day (BID) | ORAL | Status: DC
  Administered 2019-07-27 – 2019-07-31 (×8): 500 mg via ORAL
  Filled 2019-07-27: qty 1
  Filled 2019-07-27: qty 7
  Filled 2019-07-27 (×2): qty 1
  Filled 2019-07-27: qty 2
  Filled 2019-07-27: qty 7
  Filled 2019-07-27 (×7): qty 1
  Filled 2019-07-27: qty 2

## 2019-07-27 MED ORDER — TRAZODONE HCL 50 MG PO TABS: 50.0000 mg | ORAL_TABLET | Freq: Every evening | ORAL | Status: DC | PRN

## 2019-07-27 MED ORDER — NICOTINE 21 MG/24HR TD PT24
21.0000 mg | MEDICATED_PATCH | Freq: Every day | TRANSDERMAL | Status: DC
  Administered 2019-07-28 – 2019-07-31 (×4): 21 mg via TRANSDERMAL
  Filled 2019-07-27 (×2): qty 1
  Filled 2019-07-27: qty 14
  Filled 2019-07-27 (×4): qty 1

## 2019-07-27 NOTE — Progress Notes (Signed)
Pt hypotensive this am. Clonidine held. Fluid encouraged and provided to the pt. B/p rechecked and stable at this time.

## 2019-07-27 NOTE — Tx Team (Signed)
Initial Treatment Plan 07/27/2019 1:58 PM BRENISHA TSUI CWC:[id number]    PATIENT STRESSORS: Financial difficulties Health problems Medication change or noncompliance Substance abuse   PATIENT STRENGTHS: Capable of independent living Communication skills Motivation for treatment/growth   PATIENT IDENTIFIED PROBLEMS: Suicidal Ideation  Depression  Substance Abuse  Anxiety               DISCHARGE CRITERIA:  Ability to meet basic life and health needs Adequate post-discharge living arrangements  PRELIMINARY DISCHARGE PLAN: Attend aftercare/continuing care group Outpatient therapy Return to previous living arrangement  PATIENT/FAMILY INVOLVEMENT: This treatment plan has been presented to and reviewed with the patient, GLENISHA GUNDRY, and/or family member.  The patient and family have been given the opportunity to ask questions and make suggestions.  Coralyn Mark Samual Beals, RN 07/27/2019, 1:58 PM
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
## 2019-07-27 NOTE — Progress Notes (Signed)
Pt transferred to the adult unit. Report given to [facility].

## 2019-07-27 NOTE — Progress Notes (Signed)
Pt readmitted to the adult unit 300. Pt seeking treatment for increased depression, substance abuse and suicidal thoughts. Pt endorses active SI with a plan to cut her wrist. Pt denies intent. Pt verbally contracts for safety.

## 2019-07-27 NOTE — BH Assessment (Signed)
[place] Assessment Progress Note  Per Letitia Libra, FNP, this voluntary pt requires psychiatric hospitalization at this time.  Heather, RN has assigned pt to [facility] Rm 305-1.  Pt's nurse, Sharl Ma, has been notified.  Jalene Mullet, [place] Coordinator [phone]

## 2019-07-27 NOTE — Progress Notes (Signed)
   07/27/19 1000  Psych Admission Type (Psych Patients Only)  Admission Status Voluntary  Psychosocial Assessment  Patient Complaints Anxiety;Depression;Substance abuse  Eye Contact Brief  Facial Expression Flat  Affect Depressed;Anxious  Speech Logical/coherent  Interaction Cautious  Motor Activity Fidgety  Appearance/Hygiene In scrubs;Disheveled  Behavior Characteristics Cooperative;Anxious  Mood Depressed;Anxious  Thought Process  Coherency WDL  Content WDL  Delusions None reported or observed  Perception WDL  Hallucination None reported or observed  Judgment Impaired  Confusion None  Danger to Self  Current suicidal ideation? Passive  Self-Injurious Behavior Some self-injurious ideation observed or expressed.  No lethal plan expressed   Agreement Not to Harm Self Yes  Description of Agreement pt verbally contrats for safety  Danger to Others  Danger to Others None reported or observed

## 2019-07-27 NOTE — H&P (Signed)
Psychiatric Admission Assessment Adult  Patient Identification: Susan [name] MRN:  [id number] Date of Evaluation:  07/27/2019 Chief Complaint:  MDD (major depressive disorder), recurrent episode, severe ([place]) [F33.2] Polysubstance dependence ([place]) [F19.20] Principal Diagnosis: <principal problem not specified> Diagnosis:  Active Problems:   MDD (major depressive disorder), recurrent episode, severe ([place])   Polysubstance dependence ([place])  History of Present Illness: Patient is seen and examined.  Patient is a 43 year old female with a past psychiatric history significant for polysubstance dependence who presented to the [facility] emergency department on 07/26/2019 with suicidal ideation.  The patient stated she had not received her antidepressant medications for approximately 1 month, and reported increasing depressive symptoms including insomnia, irritability, isolation, fatigue, hopeless, worthless and anhedonia.  Her drug screen was positive for opiates, cocaine as well as marijuana.  The patient was last admitted to our facility on January 23, 2019.  Her diagnosis at that time was suicidal ideation with cocaine and alcohol abuse.  She was discharged on fluoxetine, gabapentin, Risperdal and trazodone.  She stated she was supposed to follow-up at [facility], but did not.  She was admitted to the hospital for evaluation and stabilization.  Associated Signs/Symptoms: Depression Symptoms:  depressed mood, anhedonia, insomnia, psychomotor agitation, fatigue, feelings of worthlessness/guilt, difficulty concentrating, hopelessness, suicidal thoughts without plan, suicidal attempt, loss of energy/fatigue, disturbed sleep, weight gain, (Hypo) Manic Symptoms:  Impulsivity, Irritable Mood, Anxiety Symptoms:  Excessive Worry, Psychotic Symptoms:  denied PTSD Symptoms: Negative Total Time spent with patient: 30 minutes  Past Psychiatric History: Patient's last admission to  our facility was on 01/23/2019.  She was diagnosed with cocaine dependence and alcohol dependence.  Also substance-induced mood disorder.  Review of the electronic medical record revealed several emergency room visits for suicidal ideation, as well as substance abuse.  Is the patient at risk to self? No.  Has the patient been a risk to self in the past 6 months? Yes.    Has the patient been a risk to self within the distant past? No.  Is the patient a risk to others? No.  Has the patient been a risk to others in the past 6 months? No.  Has the patient been a risk to others within the distant past? No.   Prior Inpatient Therapy:   Prior Outpatient Therapy:    Alcohol Screening: 1. How often do you have a drink containing alcohol?: Never 2. How many drinks containing alcohol do you have on a typical day when you are drinking?: 3 or 4 3. How often do you have six or more drinks on one occasion?: Never AUDIT-C Score: 1 4. How often during the last year have you found that you were not able to stop drinking once you had started?: Daily or almost daily 5. How often during the last year have you failed to do what was normally expected from you becasue of drinking?: Weekly 6. How often during the last year have you needed a first drink in the morning to get yourself going after a heavy drinking session?: Less than monthly 7. How often during the last year have you had a feeling of guilt of remorse after drinking?: Weekly 8. How often during the last year have you been unable to remember what happened the night before because you had been drinking?: Less than monthly 9. Have you or someone else been injured as a result of your drinking?: No 10. Has a relative or friend or a doctor or another health worker been  concerned about your drinking or suggested you cut down?: Yes, but not in the last year Alcohol Use Disorder Identification Test Final Score (AUDIT): 15 Alcohol Brief Interventions/Follow-up:  Alcohol Education Substance Abuse History in the last 12 months:  Yes.   Consequences of Substance Abuse: Negative Previous Psychotropic Medications: Yes  Psychological Evaluations: Yes  Past Medical History:  Past Medical History:  Diagnosis Date  . Alcohol abuse   . Asthma   . BV (bacterial vaginosis) 03/06/2013  . Chronic dental pain   . Chronic pelvic pain in female   . Depression   . Ectopic pregnancy   . GERD (gastroesophageal reflux disease)   . Panic attacks   . Polysubstance abuse (HCC)    cocaine, opiates, marijuana  . Trichomonas vaginitis     Past Surgical History:  Procedure Laterality Date  . ECTOPIC PREGNANCY SURGERY     Family History:  Family History  Problem Relation Age of Onset  . COPD Mother   . Stroke Mother   . Heart disease Maternal Grandmother   . Hypertension Maternal Grandmother   . Stroke Maternal Grandfather   . Asthma Sister   . Down syndrome Sister    Family Psychiatric  History: Down syndrome and mother is an alcoholic. Tobacco Screening:   Social History:  Social History   Substance and Sexual Activity  Alcohol Use Yes  . Alcohol/week: 3.0 - 4.0 standard drinks  . Types: 3 - 4 Cans of beer per week   Comment: drinks about a 40oz about once a week.      Social History   Substance and Sexual Activity  Drug Use Yes  . Types: Marijuana, Cocaine    Additional Social History:                           Allergies:  No Known Allergies Lab Results:  Results for orders placed or performed during the hospital encounter of 07/26/19 (from the past 48 hour(s))  Comprehensive metabolic panel     Status: Abnormal   Collection Time: 07/26/19  7:24 AM  Result Value Ref Range   Sodium 137 135 - 145 mmol/L   Potassium 4.0 3.5 - 5.1 mmol/L   Chloride 101 98 - 111 mmol/L   CO2 22 22 - 32 mmol/L   Glucose, Bld 131 (H) 70 - 99 mg/dL   BUN 10 6 - 20 mg/dL   Creatinine, Ser 0.61 0.44 - 1.00 mg/dL   Calcium 9.0 8.9 - 10.3 mg/dL    Total Protein 7.2 6.5 - 8.1 g/dL   Albumin 4.1 3.5 - 5.0 g/dL   AST 21 15 - 41 U/L   ALT 18 0 - 44 U/L   Alkaline Phosphatase 68 38 - 126 U/L   Total Bilirubin 0.6 0.3 - 1.2 mg/dL   GFR calc non Af Amer >60 >60 mL/min   GFR calc Af Amer >60 >60 mL/min   Anion gap 14 5 - 15    Comment: Performed at [facility], [place] [address], [place], [name] [address]  Ethanol     Status: None   Collection Time: 07/26/19  7:24 AM  Result Value Ref Range   Alcohol, Ethyl (B) <10 <10 mg/dL    Comment: (NOTE) Lowest detectable limit for serum alcohol is 10 mg/dL. For medical purposes only. Performed at [facility], [place] [address], [place], [place] [address]   Salicylate level     Status: None  Collection Time: 07/26/19  7:24 AM  Result Value Ref Range   Salicylate Lvl <6.8 2.8 - 30.0 mg/dL    Comment: Performed at [facility], [place] [address] [place], [place] [address]  Acetaminophen level     Status: Abnormal   Collection Time: 07/26/19  7:24 AM  Result Value Ref Range   Acetaminophen (Tylenol), Serum <10 (L) 10 - 30 ug/mL    Comment: (NOTE) Therapeutic concentrations vary significantly. A range of 10-30 ug/mL  may be an effective concentration for many patients. However, some  are best treated at concentrations outside of this range. Acetaminophen concentrations >150 ug/mL at 4 hours after ingestion  and >50 ug/mL at 12 hours after ingestion are often associated with  toxic reactions. Performed at [facility], [place] [address], [place], [place] [address]   cbc     Status: Abnormal   Collection Time: 07/26/19  7:24 AM  Result Value Ref Range   WBC 17.6 (H) 4.0 - 10.5 K/uL   RBC 4.62 3.87 - 5.11 MIL/uL   Hemoglobin 14.9 12.0 - 15.0 g/dL   HCT 45.1 36.0 - 46.0 %   MCV 97.6 80.0 - 100.0 fL   MCH 32.3 26.0 - 34.0 pg   MCHC 33.0 30.0 - 36.0 g/dL   RDW 13.3 11.5 - 15.5 %   Platelets 249 150 - 400 K/uL   nRBC 0.0  0.0 - 0.2 %    Comment: Performed at [facility], [place] [address], [place], [place] [address]  Rapid urine drug screen (hospital performed)     Status: Abnormal   Collection Time: 07/26/19  7:29 AM  Result Value Ref Range   Opiates POSITIVE (A) NONE DETECTED   Cocaine POSITIVE (A) NONE DETECTED   Benzodiazepines NONE DETECTED NONE DETECTED   Amphetamines NONE DETECTED NONE DETECTED   Tetrahydrocannabinol POSITIVE (A) NONE DETECTED   Barbiturates NONE DETECTED NONE DETECTED    Comment: (NOTE) DRUG SCREEN FOR MEDICAL PURPOSES ONLY.  IF CONFIRMATION IS NEEDED FOR ANY PURPOSE, NOTIFY LAB WITHIN 5 DAYS. LOWEST DETECTABLE LIMITS FOR URINE DRUG SCREEN Drug Class                     Cutoff (ng/mL) Amphetamine and metabolites    1000 Barbiturate and metabolites    200 Benzodiazepine                 675 Tricyclics and metabolites     300 Opiates and metabolites        300 Cocaine and metabolites        300 THC                            50 Performed at [facility], [place] [address], [place], [place] [address]   Urinalysis, Routine w reflex microscopic     Status: Abnormal   Collection Time: 07/26/19  7:29 AM  Result Value Ref Range   Color, Urine YELLOW YELLOW   APPearance CLEAR CLEAR   Specific Gravity, Urine 1.026 1.005 - 1.030   pH 5.0 5.0 - 8.0   Glucose, UA NEGATIVE NEGATIVE mg/dL   Hgb urine dipstick SMALL (A) NEGATIVE   Bilirubin Urine NEGATIVE NEGATIVE   Ketones, ur NEGATIVE NEGATIVE mg/dL   Protein, ur NEGATIVE NEGATIVE mg/dL   Nitrite NEGATIVE NEGATIVE   Leukocytes,Ua NEGATIVE NEGATIVE   RBC / HPF 0-5 0 - 5 RBC/hpf  WBC, UA 0-5 0 - 5 WBC/hpf   Bacteria, UA RARE (A) NONE SEEN   Squamous Epithelial / LPF 0-5 0 - 5   Mucus PRESENT     Comment: Performed at [facility], [place] [address] [place], [place] [address]  I-Stat beta hCG blood, ED     Status: None   Collection Time: 07/26/19  7:32 AM  Result Value Ref  Range   I-stat hCG, quantitative <5.0 <5 mIU/mL   Comment 3            Comment:   GEST. AGE      CONC.  (mIU/mL)   <=1 WEEK        5 - 50     2 WEEKS       50 - 500     3 WEEKS       100 - 10,000     4 WEEKS     1,000 - 30,000        FEMALE AND NON-PREGNANT FEMALE:     LESS THAN 5 mIU/mL   POC SARS Coronavirus 2 Ag-ED - Nasal Swab (BD Veritor Kit)     Status: None   Collection Time: 07/26/19  2:39 PM  Result Value Ref Range   SARS Coronavirus 2 Ag NEGATIVE NEGATIVE    Comment: (NOTE) SARS-CoV-2 antigen NOT DETECTED.  Negative results are presumptive.  Negative results do not preclude SARS-CoV-2 infection and should not be used as the sole basis for treatment or other patient management decisions, including infection  control decisions, particularly in the presence of clinical signs and  symptoms consistent with COVID-19, or in those who have been in contact with the virus.  Negative results must be combined with clinical observations, patient history, and epidemiological information. The expected result is Negative. Fact Sheet for Patients: PodPark.tn Fact Sheet for Healthcare Providers: GiftContent.is This test is not yet approved or cleared by the [place] FDA and  has been authorized for detection and/or diagnosis of SARS-CoV-2 by FDA under an Emergency Use Authorization (EUA).  This EUA will remain in effect (meaning this test can be used) for the duration of  the COVID-19 de claration under Section 564(b)(1) of the Act, 21 U.S.C. section 360bbb-3(b)(1), unless the authorization is terminated or revoked sooner.   SARS CORONAVIRUS 2 (TAT 6-24 HRS) Nasopharyngeal Nasopharyngeal Swab     Status: None   Collection Time: 07/26/19  3:22 PM   Specimen: Nasopharyngeal Swab  Result Value Ref Range   SARS Coronavirus 2 NEGATIVE NEGATIVE    Comment: (NOTE) SARS-CoV-2 target nucleic acids are NOT DETECTED. The SARS-CoV-2  RNA is generally detectable in upper and lower respiratory specimens during the acute phase of infection. Negative results do not preclude SARS-CoV-2 infection, do not rule out co-infections with other pathogens, and should not be used as the sole basis for treatment or other patient management decisions. Negative results must be combined with clinical observations, patient history, and epidemiological information. The expected result is Negative. Fact Sheet for Patients: SugarRoll.be Fact Sheet for Healthcare Providers: https://www.woods-mathews.com/ This test is not yet approved or cleared by the [place] FDA and  has been authorized for detection and/or diagnosis of SARS-CoV-2 by FDA under an Emergency Use Authorization (EUA). This EUA will remain  in effect (meaning this test can be used) for the duration of the COVID-19 declaration under Section 56 4(b)(1) of the Act, 21 U.S.C. section 360bbb-3(b)(1), unless the authorization is terminated or revoked sooner. Performed at  [facility], [place] [address] [place], [place] [address]     Blood Alcohol level:  Lab Results  Component Value Date   [facility] <10 07/26/2019   ETH <10 56/38/7564    Metabolic Disorder Labs:  Lab Results  Component Value Date   HGBA1C 5.3 01/06/2017   MPG 105 01/06/2017   Lab Results  Component Value Date   PROLACTIN 146.6 (H) 06/05/2017   PROLACTIN 54.3 (H) 01/06/2017   Lab Results  Component Value Date   CHOL 128 01/23/2019   TRIG 317 (H) 01/23/2019   HDL 40 (L) 01/23/2019   CHOLHDL 3.2 01/23/2019   VLDL 63 (H) 01/23/2019   LDLCALC 25 01/23/2019   LDLCALC 23 06/05/2017    Current Medications: Current Facility-Administered Medications  Medication Dose Route Frequency Provider Last Rate Last Admin  . acetaminophen (TYLENOL) tablet 650 mg  650 mg Oral Q6H PRN Emmaline Kluver, FNP      . acyclovir ointment (ZOVIRAX) 5 %   Topical QID Emmaline Kluver,  FNP   Given at 07/26/19 2351  . alum & mag hydroxide-simeth (MAALOX/MYLANTA) 200-200-20 MG/5ML suspension 30 mL  30 mL Oral Q4H PRN Emmaline Kluver, FNP      . cloNIDine (CATAPRES) tablet 0.1 mg  0.1 mg Oral QID Emmaline Kluver, FNP   0.1 mg at 07/26/19 2351   Followed by  . [START ON 07/29/2019] cloNIDine (CATAPRES) tablet 0.1 mg  0.1 mg Oral BH-qamhs Emmaline Kluver, FNP       Followed by  . [START ON 08/01/2019] cloNIDine (CATAPRES) tablet 0.1 mg  0.1 mg Oral QAC breakfast Emmaline Kluver, FNP      . dicyclomine (BENTYL) tablet 20 mg  20 mg Oral Q6H PRN Emmaline Kluver, FNP      . FLUoxetine (PROZAC) capsule 20 mg  20 mg Oral Daily Emmaline Kluver, FNP   20 mg at 07/27/19 3329  . folic acid (FOLVITE) tablet 1 mg  1 mg Oral Daily Sharma Covert, MD   1 mg at 07/27/19 0947  . gabapentin (NEURONTIN) capsule 400 mg  400 mg Oral TID Emmaline Kluver, FNP   400 mg at 07/27/19 5188  . hydrOXYzine (ATARAX/VISTARIL) tablet 25 mg  25 mg Oral Q6H PRN Emmaline Kluver, FNP   25 mg at 07/27/19 4166  . loperamide (IMODIUM) capsule 2-4 mg  2-4 mg Oral PRN Emmaline Kluver, FNP      . LORazepam (ATIVAN) tablet 1 mg  1 mg Oral Q6H PRN Sharma Covert, MD      . magnesium hydroxide (MILK OF MAGNESIA) suspension 30 mL  30 mL Oral Daily PRN Emmaline Kluver, FNP      . methocarbamol (ROBAXIN) tablet 500 mg  500 mg Oral Q8H PRN Emmaline Kluver, FNP      . naproxen (NAPROSYN) tablet 500 mg  500 mg Oral BID Emmaline Kluver, FNP   500 mg at 07/27/19 0831  . nicotine (NICODERM CQ - dosed in mg/24 hours) 21 mg/24hr patch           . nicotine (NICODERM CQ - dosed in mg/24 hours) patch 21 mg  21 mg Transdermal Daily Sharma Covert, MD   21 mg at 07/27/19 0947  . ondansetron (ZOFRAN-ODT) disintegrating tablet 4 mg  4 mg Oral Q6H PRN Emmaline Kluver, FNP      . risperiDONE (RISPERDAL) tablet 2 mg  2 mg Oral QHS Letitia Libra  L, FNP   2 mg at 07/26/19 2351  . thiamine tablet 100 mg  100 mg Oral Daily Sharma Covert, MD   100 mg at 07/27/19 0947  .  traZODone (DESYREL) tablet 200 mg  200 mg Oral QHS Emmaline Kluver, FNP   200 mg at 07/26/19 2351   PTA Medications: Medications Prior to Admission  Medication Sig Dispense Refill Last Dose  . FLUoxetine (PROZAC) 20 MG capsule Take 1 capsule (20 mg total) by mouth daily. 90 capsule 1   . gabapentin (NEURONTIN) 400 MG capsule Take 1 capsule (400 mg total) by mouth 3 (three) times daily. (Patient not taking: Reported on 04/12/2019) 90 capsule 1   . naproxen (NAPROSYN) 500 MG tablet Take 1 tablet (500 mg total) by mouth 2 (two) times daily. (Patient not taking: Reported on 05/15/2019) 20 tablet 0   . risperiDONE (RISPERDAL) 2 MG tablet Take 1 tablet (2 mg total) by mouth at bedtime. (Patient not taking: Reported on 04/12/2019) 30 tablet 1   . traZODone (DESYREL) 100 MG tablet Take 2 tablets (200 mg total) by mouth at bedtime. 60 tablet 1     Musculoskeletal: Strength & Muscle Tone: within normal limits Gait & Station: normal Patient leans: N/A  Psychiatric Specialty Exam: Physical Exam  Nursing note and vitals reviewed. Constitutional: She is oriented to person, place, and time. She appears well-developed and well-nourished.  HENT:  Head: Normocephalic and atraumatic.  Respiratory: Effort normal.  Neurological: She is alert and oriented to person, place, and time.    Review of Systems  Blood pressure 100/65, pulse 90, temperature 98.4 F (36.9 C), temperature source Oral, resp. rate 16, height 5' 1"  (1.549 m), weight 78 kg, SpO2 99 %, unknown if currently breastfeeding.Body mass index is 32.5 kg/m.  General Appearance: Disheveled  Eye Contact:  Fair  Speech:  Normal Rate  Volume:  Normal  Mood:  Dysphoric  Affect:  Congruent  Thought Process:  Coherent and Descriptions of Associations: Circumstantial  Orientation:  Full (Time, Place, and Person)  Thought Content:  Logical  Suicidal Thoughts:  Yes.  without intent/plan  Homicidal Thoughts:  No  Memory:  Immediate;   Fair Recent;    Fair Remote;   Fair  Judgement:  Impaired  Insight:  Lacking  Psychomotor Activity:  Normal  Concentration:  Concentration: Fair and Attention Span: Fair  Recall:  [employer] of Knowledge:  Fair  Language:  Fair  Akathisia:  Negative  Handed:  Right  AIMS (if indicated):     Assets:  Desire for Improvement Resilience  ADL's:  Intact  Cognition:  WNL  Sleep:       Treatment Plan Summary: Daily contact with patient to assess and evaluate symptoms and progress in treatment, Medication management and Plan : Patient is seen and examined.  Patient is a 43 year old female with a past psychiatric history for polysubstance dependence including cocaine, opiates, alcohol and marijuana.  She also has a reported history of bipolar disorder.  Her fluoxetine, gabapentin and Risperdal will be restarted.  She will also have available trazodone for sleep.  She will be placed on the opiate detox protocol.  She also have available lorazepam 1 mg p.o. every 6 hours as needed a CIWA greater than 10.  She will also be placed on folic acid as well as thiamine.  Review of her laboratories revealed a mildly elevated glucose at 131, white blood cell count elevated at 17.6.  Unfortunately a differential was not done.  Chest x-ray may be necessary.  From 11/19 it does look like she had vaginitis, but at least the emergency room appears to have treated her for that.  Her urinalysis from yesterday showed a hemoglobin in her blood, rare bacteria, but 0-5 white blood cells.  Again her drug screen was positive for opiates, cocaine and marijuana.  Her blood alcohol was less than 10.  Observation Level/Precautions:  Detox 15 minute checks  Laboratory:  Chemistry Profile  Psychotherapy:    Medications:    Consultations:    Discharge Concerns:    Estimated LOS:  Other:     Physician Treatment Plan for Primary Diagnosis: <principal problem not specified> Long Term Goal(s): Improvement in symptoms so as ready for  discharge  Short Term Goals: Ability to identify changes in lifestyle to reduce recurrence of condition will improve, Ability to verbalize feelings will improve, Ability to disclose and discuss suicidal ideas, Ability to demonstrate self-control will improve, Ability to identify and develop effective coping behaviors will improve, Ability to maintain clinical measurements within normal limits will improve, Compliance with prescribed medications will improve and Ability to identify triggers associated with substance abuse/mental health issues will improve  Physician Treatment Plan for Secondary Diagnosis: Active Problems:   MDD (major depressive disorder), recurrent episode, severe ([place])   Polysubstance dependence ([place])  Long Term Goal(s): Improvement in symptoms so as ready for discharge  Short Term Goals: Ability to identify changes in lifestyle to reduce recurrence of condition will improve, Ability to verbalize feelings will improve, Ability to disclose and discuss suicidal ideas, Ability to demonstrate self-control will improve, Ability to identify and develop effective coping behaviors will improve, Ability to maintain clinical measurements within normal limits will improve, Compliance with prescribed medications will improve and Ability to identify triggers associated with substance abuse/mental health issues will improve  I certify that inpatient services furnished can reasonably be expected to improve the patient's condition.    Sharma Covert, MD 12/18/202011:14 AM

## 2019-07-28 DIAGNOSIS — F333 Major depressive disorder, recurrent, severe with psychotic symptoms: Secondary | ICD-10-CM

## 2019-07-28 DIAGNOSIS — F1920 Other psychoactive substance dependence, uncomplicated: Secondary | ICD-10-CM

## 2019-07-28 MED ORDER — IBUPROFEN 600 MG PO TABS
600.0000 mg | ORAL_TABLET | Freq: Four times a day (QID) | ORAL | Status: DC | PRN
  Administered 2019-07-28 – 2019-07-29 (×3): 600 mg via ORAL
  Filled 2019-07-28 (×3): qty 1

## 2019-07-28 MED ORDER — SALINE SPRAY 0.65 % NA SOLN: 1.0000 | NASAL | Status: DC | PRN

## 2019-07-28 MED ORDER — CLOTRIMAZOLE 1 % VA CREA
1.0000 | TOPICAL_CREAM | Freq: Every day | VAGINAL | Status: DC
  Administered 2019-07-28 – 2019-07-30 (×3): 1 via VAGINAL
  Filled 2019-07-28: qty 45

## 2019-07-28 MED ORDER — BACITRACIN-NEOMYCIN-POLYMYXIN OINTMENT TUBE
TOPICAL_OINTMENT | CUTANEOUS | Status: DC | PRN
  Filled 2019-07-28: qty 14.17

## 2019-07-28 MED ORDER — TRAZODONE HCL 100 MG PO TABS
200.0000 mg | ORAL_TABLET | Freq: Every day | ORAL | Status: DC
  Administered 2019-07-28 – 2019-07-30 (×3): 200 mg via ORAL
  Filled 2019-07-28: qty 28
  Filled 2019-07-28 (×5): qty 2

## 2019-07-28 NOTE — Progress Notes (Signed)
[facility] Group Notes:  (Nursing/MHT/Case Management/Adjunct)  Date:  07/28/2019  Time:1345  Type of Therapy:  Nurse Education Discussed goals for hospitalization.   Participation Level:  Active  Participation Quality:  Appropriate  Affect:  Appropriate  Cognitive:  Appropriate  Insight:  Appropriate  Engagement in Group:  Engaged  Modes of Intervention:  Discussion and Education  Summary of Progress/Problems:  

## 2019-07-28 NOTE — Progress Notes (Signed)
Patient ID: Susan [name], female   DOB: 03/13/1976, 43 y.o.   MRN: [id number]   [place] NOVEL CORONAVIRUS (COVID-19) DAILY CHECK-OFF SYMPTOMS - answer yes or no to each - every day NO YES  Have you had a fever in the past 24 hours?  . Fever (Temp > 37.80C / 100F) X   Have you had any of these symptoms in the past 24 hours? . New Cough .  Sore Throat  .  Shortness of Breath .  Difficulty Breathing .  Unexplained Body Aches   X   Have you had any one of these symptoms in the past 24 hours not related to allergies?   . Runny Nose .  Nasal Congestion .  Sneezing   X   If you have had runny nose, nasal congestion, sneezing in the past 24 hours, has it worsened?  X   EXPOSURES - check yes or no X   Have you traveled outside the state in the past 14 days?  X   Have you been in contact with someone with a confirmed diagnosis of COVID-19 or PUI in the past 14 days without wearing appropriate PPE?  X   Have you been living in the same home as a person with confirmed diagnosis of COVID-19 or a PUI (household contact)?    X   Have you been diagnosed with COVID-19?    X              What to do next: Answered NO to all: Answered YES to anything:   Proceed with unit schedule Follow the [facility] Inpatient Flowsheet.

## 2019-07-28 NOTE — Progress Notes (Signed)
[facility] MD Progress Note  07/28/2019 12:46 PM [name]  MRN:  [id number]  Subjective: Susan [name] reports, "I need something to help my crying spells & anxiety. I'm still hearing voices, I need medicine for it. I came here because I was feeling suicidal & was having mood swings too. I could not sleep last night. I agree to come here because I need help".  Objective: Patient is a 43 year old female with a past psychiatric history significant for polysubstance dependence who presented to the [facility] emergency department on 07/26/2019 with suicidal ideation. The patient stated she had not received her antidepressant medications for approximately 1 month, and reported increasing depressive symptoms including insomnia, irritability, isolation, fatigue, hopeless, worthless and anhedonia. Her drug screen was positive for opiates, cocaine as well as marijuana. The patient was last admitted to our facility on January 23, 2019. [name] is seen, chart reviewed. The chart findings discussed with the treatment team. [name] presents alert, oriented & aware of situation. She is visible on the unit, attending group sessions. She says he is still feeling depression, anxious, hearing whispering voices & having crying spell. She is requesting medications for her anxiety, AH, depression & crying spells. She is explained that she has been started on an antidepressant, antipsychotic & medications for agitations, anxiety symptoms & substance withdrawal symptoms. She continues to endorse suicidal ideations without any plans or intent to hurt herself. She is able to verbally contract for safety. Although complaining of the above listed symptoms, [name] presents with a good affect & eye contact. She is encouraged to give her medications time to get into her system. She does not appear to be responding to any internal stimuli. Will continue her current plan of care as already in progress.  Principal Problem:  Polysubstance dependence ([place])  Diagnosis: Principal Problem:   Polysubstance dependence ([place]) Active Problems:   Substance induced mood disorder (HCC)   MDD (major depressive disorder), recurrent episode, severe ([place])  Total Time spent with patient: 25 minutes  Past Psychiatric History: See H&P  Past Medical History:  Past Medical History:  Diagnosis Date  . Alcohol abuse   . Asthma   . BV (bacterial vaginosis) 03/06/2013  . Chronic dental pain   . Chronic pelvic pain in female   . Depression   . Ectopic pregnancy   . GERD (gastroesophageal reflux disease)   . Panic attacks   . Polysubstance abuse (HCC)    cocaine, opiates, marijuana  . Trichomonas vaginitis     Past Surgical History:  Procedure Laterality Date  . ECTOPIC PREGNANCY SURGERY     Family History:  Family History  Problem Relation Age of Onset  . COPD Mother   . Stroke Mother   . Heart disease Maternal Grandmother   . Hypertension Maternal Grandmother   . Stroke Maternal Grandfather   . Asthma Sister   . Down syndrome Sister    Family Psychiatric  History: See H&P  Social History:  Social History   Substance and Sexual Activity  Alcohol Use Yes  . Alcohol/week: 3.0 - 4.0 standard drinks  . Types: 3 - 4 Cans of beer per week   Comment: drinks about a 40oz about once a week.      Social History   Substance and Sexual Activity  Drug Use Yes  . Types: Marijuana, Cocaine    Social History   Socioeconomic History  . Marital status: Single    Spouse name: Not on file  . Number of  children: Not on file  . Years of education: Not on file  . Highest education level: Not on file  Occupational History  . Not on file  Tobacco Use  . Smoking status: Current Every Day Smoker    Packs/day: 1.00    Years: 26.00    Pack years: 26.00    Types: Cigarettes  . Smokeless tobacco: Never Used  Substance and Sexual Activity  . Alcohol use: Yes    Alcohol/week: 3.0 - 4.0 standard drinks    Types: 3 - 4  Cans of beer per week    Comment: drinks about a 40oz about once a week.   . Drug use: Yes    Types: Marijuana, Cocaine  . Sexual activity: Yes    Birth control/protection: None  Other Topics Concern  . Not on file  Social History Narrative  . Not on file   Social Determinants of Health   Financial Resource Strain:   . Difficulty of Paying Living Expenses: Not on file  Food Insecurity:   . Worried About Charity fundraiser in the Last Year: Not on file  . Ran Out of Food in the Last Year: Not on file  Transportation Needs:   . Lack of Transportation (Medical): Not on file  . Lack of Transportation (Non-Medical): Not on file  Physical Activity:   . Days of Exercise per Week: Not on file  . Minutes of Exercise per Session: Not on file  Stress:   . Feeling of Stress : Not on file  Social Connections:   . Frequency of Communication with Friends and Family: Not on file  . Frequency of Social Gatherings with Friends and Family: Not on file  . Attends Religious Services: Not on file  . Active Member of Clubs or Organizations: Not on file  . Attends Archivist Meetings: Not on file  . Marital Status: Not on file   Additional Social History:   Sleep: Fair  Appetite:  Fair  Current Medications: Current Facility-Administered Medications  Medication Dose Route Frequency Provider Last Rate Last Admin  . acetaminophen (TYLENOL) tablet 650 mg  650 mg Oral Q6H PRN Emmaline Kluver, FNP   650 mg at 07/28/19 2778  . acyclovir ointment (ZOVIRAX) 5 %   Topical QID Emmaline Kluver, FNP   Given at 07/28/19 0805  . alum & mag hydroxide-simeth (MAALOX/MYLANTA) 200-200-20 MG/5ML suspension 30 mL  30 mL Oral Q4H PRN Emmaline Kluver, FNP      . dicyclomine (BENTYL) tablet 20 mg  20 mg Oral Q6H PRN Emmaline Kluver, FNP   20 mg at 07/28/19 1148  . FLUoxetine (PROZAC) capsule 20 mg  20 mg Oral Daily Emmaline Kluver, FNP   20 mg at 07/28/19 0804  . folic acid (FOLVITE) tablet 1 mg  1 mg Oral Daily Emmaline Kluver, FNP   1 mg at 07/28/19 0804  . gabapentin (NEURONTIN) capsule 400 mg  400 mg Oral TID Emmaline Kluver, FNP   400 mg at 07/28/19 1148  . hydrOXYzine (ATARAX/VISTARIL) tablet 25 mg  25 mg Oral Q6H PRN Emmaline Kluver, FNP   25 mg at 07/28/19 1148  . loperamide (IMODIUM) capsule 2-4 mg  2-4 mg Oral PRN Emmaline Kluver, FNP      . LORazepam (ATIVAN) tablet 1 mg  1 mg Oral Q6H PRN Emmaline Kluver, FNP      . magnesium hydroxide (MILK OF MAGNESIA) suspension 30 mL  30  mL Oral Daily PRN Emmaline Kluver, FNP      . methocarbamol (ROBAXIN) tablet 500 mg  500 mg Oral Q8H PRN Emmaline Kluver, FNP   500 mg at 07/28/19 0825  . metroNIDAZOLE (FLAGYL) tablet 500 mg  500 mg Oral BID Lindell Spar I, NP   500 mg at 07/28/19 0804  . naproxen (NAPROSYN) tablet 500 mg  500 mg Oral BID Emmaline Kluver, FNP   500 mg at 07/28/19 0804  . nicotine (NICODERM CQ - dosed in mg/24 hours) patch 21 mg  21 mg Transdermal Daily Emmaline Kluver, FNP   21 mg at 07/28/19 0805  . ondansetron (ZOFRAN-ODT) disintegrating tablet 4 mg  4 mg Oral Q6H PRN Emmaline Kluver, FNP      . risperiDONE (RISPERDAL) tablet 2 mg  2 mg Oral QHS Emmaline Kluver, FNP   2 mg at 07/27/19 2120  . thiamine tablet 100 mg  100 mg Oral Daily Emmaline Kluver, FNP   100 mg at 07/28/19 4098   Lab Results:  Results for orders placed or performed during the hospital encounter of 07/26/19 (from the past 48 hour(s))  POC SARS Coronavirus 2 Ag-ED - Nasal Swab (BD Veritor Kit)     Status: None   Collection Time: 07/26/19  2:39 PM  Result Value Ref Range   SARS Coronavirus 2 Ag NEGATIVE NEGATIVE    Comment: (NOTE) SARS-CoV-2 antigen NOT DETECTED.  Negative results are presumptive.  Negative results do not preclude SARS-CoV-2 infection and should not be used as the sole basis for treatment or other patient management decisions, including infection  control decisions, particularly in the presence of clinical signs and  symptoms consistent with COVID-19, or in those who have been in contact  with the virus.  Negative results must be combined with clinical observations, patient history, and epidemiological information. The expected result is Negative. Fact Sheet for Patients: PodPark.tn Fact Sheet for Healthcare Providers: GiftContent.is This test is not yet approved or cleared by the [place] FDA and  has been authorized for detection and/or diagnosis of SARS-CoV-2 by FDA under an Emergency Use Authorization (EUA).  This EUA will remain in effect (meaning this test can be used) for the duration of  the COVID-19 de claration under Section 564(b)(1) of the Act, 21 U.S.C. section 360bbb-3(b)(1), unless the authorization is terminated or revoked sooner.   SARS CORONAVIRUS 2 (TAT 6-24 HRS) Nasopharyngeal Nasopharyngeal Swab     Status: None   Collection Time: 07/26/19  3:22 PM   Specimen: Nasopharyngeal Swab  Result Value Ref Range   SARS Coronavirus 2 NEGATIVE NEGATIVE    Comment: (NOTE) SARS-CoV-2 target nucleic acids are NOT DETECTED. The SARS-CoV-2 RNA is generally detectable in upper and lower respiratory specimens during the acute phase of infection. Negative results do not preclude SARS-CoV-2 infection, do not rule out co-infections with other pathogens, and should not be used as the sole basis for treatment or other patient management decisions. Negative results must be combined with clinical observations, patient history, and epidemiological information. The expected result is Negative. Fact Sheet for Patients: SugarRoll.be Fact Sheet for Healthcare Providers: https://www.woods-mathews.com/ This test is not yet approved or cleared by the [place] FDA and  has been authorized for detection and/or diagnosis of SARS-CoV-2 by FDA under an Emergency Use Authorization (EUA). This EUA will remain  in effect (meaning this test can be used) for the duration of  the COVID-19 declaration under Section 56 4(b)(1) of the  Act, 21 U.S.C. section 360bbb-3(b)(1), unless the authorization is terminated or revoked sooner. Performed at [facility], [place] [address], [place], [place] [address]    Blood Alcohol level:  Lab Results  Component Value Date   [facility] <10 07/26/2019   ETH <10 78/67/6720   Metabolic Disorder Labs: Lab Results  Component Value Date   HGBA1C 5.3 01/06/2017   MPG 105 01/06/2017   Lab Results  Component Value Date   PROLACTIN 146.6 (H) 06/05/2017   PROLACTIN 54.3 (H) 01/06/2017   Lab Results  Component Value Date   CHOL 128 01/23/2019   TRIG 317 (H) 01/23/2019   HDL 40 (L) 01/23/2019   CHOLHDL 3.2 01/23/2019   VLDL 63 (H) 01/23/2019   LDLCALC 25 01/23/2019   LDLCALC 23 06/05/2017   Physical Findings: AIMS:  , ,  ,  ,    CIWA:    COWS:  COWS Total Score: 6  Musculoskeletal: Strength & Muscle Tone: within normal limits Gait & Station: normal Patient leans: N/A  Psychiatric Specialty Exam: Physical Exam  Nursing note and vitals reviewed. Constitutional: She is oriented to person, place, and time. She appears well-developed.  Cardiovascular: Normal rate.  Respiratory: Effort normal.  Genitourinary:    Genitourinary Comments: Deferred   Musculoskeletal:        General: Normal range of motion.     Cervical back: Normal range of motion.  Neurological: She is alert and oriented to person, place, and time.  Skin: Skin is warm and dry.    Review of Systems  Constitutional: Negative for chills, fatigue and fever.  HENT: Negative for congestion, rhinorrhea, sneezing and sore throat.   Respiratory: Negative for cough, shortness of breath and wheezing.   Musculoskeletal: Positive for myalgias.  Skin: Negative for color change.  Allergic/Immunologic: Negative for environmental allergies and food allergies.  Neurological: Negative for dizziness, tremors and headaches.  Psychiatric/Behavioral: Positive for  hallucinations ("Auditory hallucinations") and sleep disturbance. Negative for agitation, behavioral problems, confusion, decreased concentration and dysphoric mood. The patient is nervous/anxious.     Blood pressure 108/69, pulse 74, temperature 98.4 F (36.9 C), temperature source Oral, resp. rate 18, height 5' 1"  (1.549 m), weight 77.1 kg, SpO2 99 %, unknown if currently breastfeeding.Body mass index is 32.12 kg/m.  General Appearance: Disheveled  Eye Contact:  Fair  Speech:  Normal Rate  Volume:  Normal  Mood:  Dysphoric  Affect:  Congruent  Thought Process:  Coherent and Descriptions of Associations: Circumstantial  Orientation:  Full (Time, Place, and Person)  Thought Content:  Logical  Suicidal Thoughts:  Yes.  without intent/plan  Homicidal Thoughts:  No  Memory:  Immediate;   Fair Recent;   Fair Remote;   Fair  Judgement:  Impaired  Insight:  Lacking  Psychomotor Activity:  Normal  Concentration:  Concentration: Fair and Attention Span: Fair  Recall:  [employer] of Knowledge:  Fair  Language:  Fair  Akathisia:  Negative  Handed:  Right  AIMS (if indicated):     Assets:  Desire for Improvement Resilience  ADL's:  Intact  Cognition:  WNL    Sleep:  Number of Hours: 5.75   Treatment Plan Summary: Daily contact with patient to assess and evaluate symptoms and progress in treatment and Medication management.  -Continue inpatient hospitalization.  -Will continue today 07/28/2019 plan as below except where it is noted.  -Mood control  -Continue risperdal 2 mg po qhs   -Continue Prozac 20 mg po qd for  depression.  -Anxiety  -Continue atarax 25 mg po q6h prn anxiety  -Insomnia  -Re-instated Trazodone 141m po qhs.  -agitation    -Continue gabapentin 400 mg po tid.  -Prn CIWA/COWS             -Continue Lorazepam 1 mg po Q 6 hrs prn for CIWA > 10.             -Continue Imodium 2-4 mg caps prn for loose stools.             -Robaxin 500 mg po Q 8 hours prn for  muscle spasms.             -Naprosyn 500 mg po bid prn for pain.             -Zofran-ODT 4 mg po Q 6 hrs prn for N/V.             -Continue Thiamine 100 mg po daily for thiamine replacement.             -Continue folic acid 1 mg po daily for folate replacement.             -Continue Bentyl 20 mg po Q 6 hours prn for abdominal pain.              -Other medical issues.             -Continue Flagyl 500 mg po bid x 7 days for yeast infection.             -Continue Zovirax 5% ointment, apply to lip qid for herpes simplex.  -Encourage participation in groups and therapeutic milieu  -Disposition planning will be ongoing  ALindell Spar NP, PMHNP, FNP-BC 07/28/2019, 12:46 PM

## 2019-07-28 NOTE — Progress Notes (Signed)
Patient ID: ALTA GODING, female   DOB: 1976/03/08, 43 y.o.   MRN: [id number]   D: Patient in bed except getting up to use the phone and got her bedtime medication. Only complaining of back pain tonight and reports this is a chronic issue. No complaints of withdrawals. Continues to have some passive SI but contract on the unit. No attempts to harm self. A: Staff will continue to monitor patient, give medications as ordered, and follow treatment plan. R: Patient calm and cooperative on the unit.

## 2019-07-28 NOTE — Plan of Care (Signed)
  Problem: Medication: Goal: Compliance with prescribed medication regimen will improve Outcome: Progressing   Problem: Coping: Goal: Will verbalize feelings Outcome: Progressing

## 2019-07-28 NOTE — [facility] Group Notes (Signed)
Adult Psychoeducational Group Note  Date:  07/28/2019 Time:  11:02 PM  Group Topic/Focus:  Wrap-Up Group:   The focus of this group is to help patients review their daily goal of treatment and discuss progress on daily workbooks.  Participation Level:  Active  Participation Quality:  Appropriate and Attentive  Affect:  Appropriate  Cognitive:  Alert and Appropriate  Insight: Appropriate and Good  Engagement in Group:  Engaged  Modes of Intervention:  Discussion and Education  Additional Comments:  Pt attended and participated in wrapup group this evening and rated their day a 5/10. Pt has been feeling depressed, and upset due to outside factors, which has caused her mood to be up and down. Pt had no goal for today, but tomorrow pt would like to speak with SW/DR about going to a halfway house.    Cristi Loron 07/28/2019, 11:02 PM

## 2019-07-29 MED ORDER — HYDROCERIN EX CREA
TOPICAL_CREAM | Freq: Three times a day (TID) | CUTANEOUS | Status: DC | PRN
  Filled 2019-07-29 (×2): qty 113

## 2019-07-29 MED ORDER — FLUOXETINE HCL 20 MG PO CAPS
30.0000 mg | ORAL_CAPSULE | Freq: Every day | ORAL | Status: DC
  Administered 2019-07-30 – 2019-07-31 (×2): 30 mg via ORAL
  Filled 2019-07-29 (×4): qty 1
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
## 2019-07-29 NOTE — Progress Notes (Signed)
[facility] MD Progress Note  07/29/2019 1:58 PM Susan [name]  MRN:  [id number]  Subjective: Susan [name] reports, "I'm just feeling tired & exhausted. Still feeling very depressed. I just need my depression medicine to be increased a little bit to help me stop these crying spells. My life was tangled with drug addiction for a long time. I'm trying to do right. I just need to go a residential treatment center after discharged. I need the treatment".   Objective: Patient is a 43 year old female with a past psychiatric history significant for polysubstance dependence who presented to the [facility] emergency department on 07/26/2019 with suicidal ideation. The patient stated she had not received her antidepressant medications for approximately 1 month, and reported increasing depressive symptoms including insomnia, irritability, isolation, fatigue, hopeless, worthless and anhedonia. Her drug screen was positive for opiates, cocaine as well as marijuana. The patient was last admitted to our facility on January 23, 2019. Ande is seen, chart reviewed. The chart findings discussed with the treatment team. [name] presents alert, oriented & aware of situation. She is visible on the unit, attending group sessions. She says she is still feeling depressed & having crying spell. She is requesting increase in her depression medication to help stop the crying spells. She also would like a referral & placement to a long term substance abuse treatment facility after discharged. Although complaining of the above listed symptoms, Susan [name] presents with a good affect & eye contact. She is encouraged to give her medications time to get into her system. She does not appear to be responding to any internal stimuli. Will continue her current plan of care as already in progress with Prozac increased to 30 mg po daily starting tomorrow.  Principal Problem: Polysubstance dependence ([place])  Diagnosis: Principal Problem:    Polysubstance dependence ([place]) Active Problems:   Substance induced mood disorder (HCC)   MDD (major depressive disorder), recurrent episode, severe ([place])  Total Time spent with patient: 25 minutes  Past Psychiatric History: See H&P  Past Medical History:  Past Medical History:  Diagnosis Date  . Alcohol abuse   . Asthma   . BV (bacterial vaginosis) 03/06/2013  . Chronic dental pain   . Chronic pelvic pain in female   . Depression   . Ectopic pregnancy   . GERD (gastroesophageal reflux disease)   . Panic attacks   . Polysubstance abuse (H[facility])    cocaine, opiates, marijuana  . Trichomonas vaginitis     Past Surgical History:  Procedure Laterality Date  . ECTOPIC PREGNANCY SURGERY     Family History:  Family History  Problem Relation Age of Onset  . COPD Mother   . Stroke Mother   . Heart disease Maternal Grandmother   . Hypertension Maternal Grandmother   . Stroke Maternal Grandfather   . Asthma Sister   . Down syndrome Sister    Family Psychiatric  History: See H&P  Social History:  Social History   Substance and Sexual Activity  Alcohol Use Yes  . Alcohol/week: 3.0 - 4.0 standard drinks  . Types: 3 - 4 Cans of beer per week   Comment: drinks about a 40oz about once a week.      Social History   Substance and Sexual Activity  Drug Use Yes  . Types: Marijuana, Cocaine    Social History   Socioeconomic History  . Marital status: Single    Spouse name: Not on file  . Number of children: Not on file  .  Years of education: Not on file  . Highest education level: Not on file  Occupational History  . Not on file  Tobacco Use  . Smoking status: Current Every Day Smoker    Packs/day: 1.00    Years: 26.00    Pack years: 26.00    Types: Cigarettes  . Smokeless tobacco: Never Used  Substance and Sexual Activity  . Alcohol use: Yes    Alcohol/week: 3.0 - 4.0 standard drinks    Types: 3 - 4 Cans of beer per week    Comment: drinks about a 40oz about once  a week.   . Drug use: Yes    Types: Marijuana, Cocaine  . Sexual activity: Yes    Birth control/protection: None  Other Topics Concern  . Not on file  Social History Narrative  . Not on file   Social Determinants of Health   Financial Resource Strain:   . Difficulty of Paying Living Expenses: Not on file  Food Insecurity:   . Worried About Programme researcher, broadcasting/film/video in the Last Year: Not on file  . Ran Out of Food in the Last Year: Not on file  Transportation Needs:   . Lack of Transportation (Medical): Not on file  . Lack of Transportation (Non-Medical): Not on file  Physical Activity:   . Days of Exercise per Week: Not on file  . Minutes of Exercise per Session: Not on file  Stress:   . Feeling of Stress : Not on file  Social Connections:   . Frequency of Communication with Friends and Family: Not on file  . Frequency of Social Gatherings with Friends and Family: Not on file  . Attends Religious Services: Not on file  . Active Member of Clubs or Organizations: Not on file  . Attends Banker Meetings: Not on file  . Marital Status: Not on file   Additional Social History:   Sleep: Fair  Appetite:  Fair  Current Medications: Current Facility-Administered Medications  Medication Dose Route Frequency Provider Last Rate Last Admin  . acetaminophen (TYLENOL) tablet 650 mg  650 mg Oral Q6H PRN Patrcia Dolly, FNP   650 mg at 07/29/19 1610  . acyclovir ointment (ZOVIRAX) 5 %   Topical QID Patrcia Dolly, FNP   Given at 07/29/19 [phone]  . alum & mag hydroxide-simeth (MAALOX/MYLANTA) 200-200-20 MG/5ML suspension 30 mL  30 mL Oral Q4H PRN Patrcia Dolly, FNP      . clotrimazole (GYNE-LOTRIMIN) vaginal cream 1 Applicatorful  1 Applicatorful Vaginal QHS Nira Conn A, NP   1 Applicatorful at 07/28/19 2232  . dicyclomine (BENTYL) tablet 20 mg  20 mg Oral Q6H PRN Patrcia Dolly, FNP   20 mg at 07/28/19 2047  . FLUoxetine (PROZAC) capsule 20 mg  20 mg Oral Daily Patrcia Dolly, FNP   20  mg at 07/29/19 0736  . folic acid (FOLVITE) tablet 1 mg  1 mg Oral Daily Patrcia Dolly, FNP   1 mg at 07/29/19 0736  . gabapentin (NEURONTIN) capsule 400 mg  400 mg Oral TID Patrcia Dolly, FNP   400 mg at 07/29/19 1306  . hydrOXYzine (ATARAX/VISTARIL) tablet 25 mg  25 mg Oral Q6H PRN Patrcia Dolly, FNP   25 mg at 07/29/19 1353  . ibuprofen (ADVIL) tablet 600 mg  600 mg Oral Q6H PRN Nira Conn A, NP   600 mg at 07/29/19 1309  . loperamide (IMODIUM) capsule 2-4 mg  2-4 mg Oral  PRN Patrcia Dolly, FNP      . LORazepam (ATIVAN) tablet 1 mg  1 mg Oral Q6H PRN Patrcia Dolly, FNP   1 mg at 07/28/19 2047  . magnesium hydroxide (MILK OF MAGNESIA) suspension 30 mL  30 mL Oral Daily PRN Patrcia Dolly, FNP      . methocarbamol (ROBAXIN) tablet 500 mg  500 mg Oral Q8H PRN Patrcia Dolly, FNP   500 mg at 07/29/19 1309  . metroNIDAZOLE (FLAGYL) tablet 500 mg  500 mg Oral BID Armandina Stammer I, NP   500 mg at 07/29/19 0998  . naproxen (NAPROSYN) tablet 500 mg  500 mg Oral BID Patrcia Dolly, FNP   500 mg at 07/29/19 3382  . neomycin-bacitracin-polymyxin (NEOSPORIN) ointment   Topical PRN Armandina Stammer I, NP      . nicotine (NICODERM CQ - dosed in mg/24 hours) patch 21 mg  21 mg Transdermal Daily Patrcia Dolly, FNP   21 mg at 07/29/19 0736  . ondansetron (ZOFRAN-ODT) disintegrating tablet 4 mg  4 mg Oral Q6H PRN Patrcia Dolly, FNP      . risperiDONE (RISPERDAL) tablet 2 mg  2 mg Oral QHS Patrcia Dolly, FNP   2 mg at 07/28/19 2143  . sodium chloride (OCEAN) 0.65 % nasal spray 1 spray  1 spray Each Nare PRN Nira Conn A, NP      . thiamine tablet 100 mg  100 mg Oral Daily Patrcia Dolly, FNP   100 mg at 07/29/19 0736  . traZODone (DESYREL) tablet 200 mg  200 mg Oral QHS Coreena Rubalcava I, NP   200 mg at 07/28/19 2143   Lab Results:  No results found for this or any previous visit (from the past 48 hour(s)). Blood Alcohol level:  Lab Results  Component Value Date   ETH <10 07/26/2019   ETH <10 01/22/2019   Metabolic Disorder  Labs: Lab Results  Component Value Date   HGBA1C 5.3 01/06/2017   MPG 105 01/06/2017   Lab Results  Component Value Date   PROLACTIN 146.6 (H) 06/05/2017   PROLACTIN 54.3 (H) 01/06/2017   Lab Results  Component Value Date   CHOL 128 01/23/2019   TRIG 317 (H) 01/23/2019   HDL 40 (L) 01/23/2019   CHOLHDL 3.2 01/23/2019   VLDL 63 (H) 01/23/2019   LDLCALC 25 01/23/2019   LDLCALC 23 06/05/2017   Physical Findings: AIMS:  , ,  ,  ,    CIWA:  CIWA-Ar Total: 5 COWS:  COWS Total Score: 2  Musculoskeletal: Strength & Muscle Tone: within normal limits Gait & Station: normal Patient leans: N/A  Psychiatric Specialty Exam: Physical Exam  Nursing note and vitals reviewed. Constitutional: She is oriented to person, place, and time. She appears well-developed.  Cardiovascular: Normal rate.  Respiratory: Effort normal.  Genitourinary:    Genitourinary Comments: Deferred   Musculoskeletal:        General: Normal range of motion.     Cervical back: Normal range of motion.  Neurological: She is alert and oriented to person, place, and time.  Skin: Skin is warm and dry.    Review of Systems  Constitutional: Negative for chills, fatigue and fever.  HENT: Negative for congestion, rhinorrhea, sneezing and sore throat.   Respiratory: Negative for cough, shortness of breath and wheezing.   Musculoskeletal: Positive for myalgias.  Skin: Negative for color change.  Allergic/Immunologic: Negative for environmental allergies and food allergies.  Neurological: Negative  for dizziness, tremors and headaches.  Psychiatric/Behavioral: Positive for hallucinations ("Auditory hallucinations") and sleep disturbance. Negative for agitation, behavioral problems, confusion, decreased concentration and dysphoric mood. The patient is nervous/anxious.     Blood pressure 109/71, pulse 79, temperature (!) 97.5 F (36.4 C), temperature source Oral, resp. rate 16, height 5\' 1"  (1.549 m), weight 77.1 kg, SpO2  99 %, unknown if currently breastfeeding.Body mass index is 32.12 kg/m.  General Appearance: Disheveled  Eye Contact:  Fair  Speech:  Normal Rate  Volume:  Normal  Mood:  Dysphoric  Affect:  Congruent  Thought Process:  Coherent and Descriptions of Associations: Circumstantial  Orientation:  Full (Time, Place, and Person)  Thought Content:  Logical  Suicidal Thoughts:  Yes.  without intent/plan  Homicidal Thoughts:  No  Memory:  Immediate;   Fair Recent;   Fair Remote;   Fair  Judgement:  Impaired  Insight:  Lacking  Psychomotor Activity:  Normal  Concentration:  Concentration: Fair and Attention Span: Fair  Recall:  FiservFair  Fund of Knowledge:  Fair  Language:  Fair  Akathisia:  Negative  Handed:  Right  AIMS (if indicated):     Assets:  Desire for Improvement Resilience  ADL's:  Intact  Cognition:  WNL    Sleep:  Number of Hours: 6   Treatment Plan Summary: Daily contact with patient to assess and evaluate symptoms and progress in treatment and Medication management.  -Continue inpatient hospitalization.  -Will continue today 07/29/2019 plan as below except where it is noted.  -Mood control  -Continue risperdal 2 mg po qhs   -Increased Prozac to 30 mg po qd for depression.  -Anxiety  -Continue atarax 25 mg po q6h prn anxiety  -Insomnia  -Continue Trazodone 100mg  po qhs.  -agitation    -Continue gabapentin 400 mg po tid.  -Prn CIWA/COWS             -Continue Lorazepam 1 mg po Q 6 hrs prn for CIWA > 10.             -Continue Imodium 2-4 mg caps prn for loose stools.             -Robaxin 500 mg po Q 8 hours prn for muscle spasms.             -Naprosyn 500 mg po bid prn for pain.             -Zofran-ODT 4 mg po Q 6 hrs prn for N/V.             -Continue Thiamine 100 mg po daily for thiamine replacement.             -Continue folic acid 1 mg po daily for folate replacement.             -Continue Bentyl 20 mg po Q 6 hours prn for abdominal pain.               -Other medical issues.             -Continue Flagyl 500 mg po bid x 7 days for yeast infection.             -Continue Zovirax 5% ointment, apply to lip qid for herpes simplex.  -Encourage participation in groups and therapeutic milieu  -Disposition planning will be ongoing  Armandina StammerAgnes Dolora Ridgely, NP, PMHNP, FNP-BC 07/29/2019, 1:58 PMPatient ID: Susan [name], female   DOB: [birth year], [age] y.o.   MRN: [id number]

## 2019-07-29 NOTE — Progress Notes (Signed)
Patient was observed sitting at hall phone by the medication window. Writer introduced self and informed her of medications scheduled for 2200.She c/o having back pain and received advil prn and heat pack. She reports that she is hopeful to get into a treatment program and knows that her triggers are her friends that get high also. She reports having a supportive mother. Writer encouraged her to follow through with her goal. Safety maintained on unit with 15 min checks.

## 2019-07-29 NOTE — BHH Group Notes (Signed)
[facility] Group Notes:  (Nursing/MHT/Case Management/Adjunct)  Date:  07/29/2019  Time:  1030 am  Type of Therapy:  Nurse Education  Discussed coping skills for mental health disorders and implemented arts and crafts.   Participation Level:  Active  Participation Quality:  Appropriate  Affect:  Appropriate  Cognitive:  Appropriate  Insight:  Appropriate  Engagement in Group:  Engaged  Modes of Intervention:  Activity and Discussion  Summary of Progress/Problems:  Marissa Calamity 07/29/2019, 12:01 PM

## 2019-07-29 NOTE — Progress Notes (Signed)
D.  Pt appears anxious and fixated on somatic complaints.  She requested vaginal cream as well as something for nasal stuffiness. She also requested medication for anxiety, chronic back pain, and withdrawal.   Pt was positive for evening wrap up group, observed engaged in appropriate interaction with peers on the unit.  Pt denies SI/HI/AVH at this time.  A.  Support and encouragement offered, medication given as ordered.  Spoke with NP and new orders received per Pt requests.  R.  Pt remains safe on the unit, will continue to monitor.

## 2019-07-29 NOTE — Plan of Care (Signed)
Progress note  D: pt found in bed; compliant with medication administration. Pt rates their depression/hopelessness/anxiety an 8/7/9 out of 10 respectively. Pt has complaints of lower back pain that is chronic, lightheadedness, dizziness, and cravings. Pt is minimal, sad, and sullen on approach. Pt states their goal for today is to take their medications. Pt endorses passive si with no plan. Pt denies hi/ah/vh and verbally agrees to approach staff if these and/or si become apparent or before harming themself/others while at [facility].  A: Pt provided support and encouragement. Pt given medication per protocol and standing orders. Q50m safety checks implemented and continued.  R: Pt safe on the unit. Will continue to monitor.  Pt progressing the following metrics  Problem: Education: Goal: Knowledge of [facility] Education information/materials will improve Outcome: Progressing Goal: Emotional status will improve Outcome: Progressing Goal: Mental status will improve Outcome: Progressing Goal: Verbalization of understanding the information provided will improve Outcome: Progressing

## 2019-07-30 MED ORDER — FLUOXETINE HCL 10 MG PO CAPS: 30.0000 mg | ORAL_CAPSULE | Freq: Every day | ORAL | 0 refills | Status: DC

## 2019-07-30 MED ORDER — RISPERIDONE 2 MG PO TABS: 2.0000 mg | ORAL_TABLET | Freq: Every day | ORAL | 0 refills | Status: DC

## 2019-07-30 MED ORDER — NICOTINE 21 MG/24HR TD PT24: 21.0000 mg | MEDICATED_PATCH | Freq: Every day | TRANSDERMAL | 0 refills | Status: DC

## 2019-07-30 MED ORDER — TRAZODONE HCL 100 MG PO TABS: 200.0000 mg | ORAL_TABLET | Freq: Every day | ORAL | 0 refills | Status: DC

## 2019-07-30 MED ORDER — METRONIDAZOLE 500 MG PO TABS: 500.0000 mg | ORAL_TABLET | Freq: Two times a day (BID) | ORAL | 0 refills | Status: DC

## 2019-07-30 MED ORDER — CLOTRIMAZOLE 1 % VA CREA: 1.0000 | TOPICAL_CREAM | Freq: Every day | VAGINAL | 0 refills | Status: AC

## 2019-07-30 MED ORDER — HYDROCERIN EX CREA: 1.0000 "application " | TOPICAL_CREAM | Freq: Three times a day (TID) | CUTANEOUS | 0 refills | Status: DC | PRN

## 2019-07-30 MED ORDER — HYDROXYZINE HCL 25 MG PO TABS: 25.0000 mg | ORAL_TABLET | Freq: Four times a day (QID) | ORAL | 0 refills | Status: DC | PRN

## 2019-07-30 MED ORDER — NAPROXEN 500 MG PO TABS: 500.0000 mg | ORAL_TABLET | Freq: Two times a day (BID) | ORAL | 0 refills | Status: DC

## 2019-07-30 MED ORDER — BACITRACIN-NEOMYCIN-POLYMYXIN OINTMENT TUBE: 1.0000 "application " | TOPICAL_OINTMENT | CUTANEOUS | 0 refills | Status: DC | PRN

## 2019-07-30 MED ORDER — GABAPENTIN 400 MG PO CAPS: 400.0000 mg | ORAL_CAPSULE | Freq: Three times a day (TID) | ORAL | 0 refills | Status: DC

## 2019-07-30 MED ORDER — FLUOXETINE HCL 10 MG PO CAPS
30.0000 mg | ORAL_CAPSULE | Freq: Every day | ORAL | Status: DC
  Filled 2019-07-30 (×2): qty 21

## 2019-07-30 MED ORDER — ACYCLOVIR 5 % EX OINT: TOPICAL_OINTMENT | Freq: Four times a day (QID) | CUTANEOUS | 0 refills | Status: DC

## 2019-07-30 NOTE — Progress Notes (Signed)
ADULT GRIEF GROUP NOTE:  Spiritual care group on grief and loss facilitated by chaplain Jerene Pitch MDiv, BCC  Group Goal:  Support / Education around grief and loss Members engage in facilitated group support and psycho-social education.  Group Description:  Following introductions and group rules, group members engaged in facilitated group dialog and support around topic of loss, with particular support around experiences of loss in their lives. Group Identified types of loss (relationships / self / things) and identified patterns, circumstances, and changes that precipitate losses. Reflected on thoughts / feelings around loss, normalized grief responses, and recognized variety in grief experience.   Group noted Worden's four tasks of grief in discussion.  Group drew on Adlerian / Rogerian, narrative, MI, Patient Progress:  Gaynel was present at beginning of group.  Noted that she connects grief to her experience with her mother - who has end-stage COPD - as well as reflecting on mental illness.  She noted she lives with her mother and feels she is experiencing anticipatory grief around mother's terminal illness.   Annalynne left group as another member was speaking and did not return.

## 2019-07-30 NOTE — Progress Notes (Signed)
Patient ID: Susan [name], female   DOB: Dec 03, 1975, 43 y.o.   MRN: [id number]  D: Patient making plans to go to [facility] tomorrow. Some anxiety noted but patient reports that she has been there in the past. No active SI. Took medications without issues. Will have out front at 7 am for Lyft. A: Staff will monitor on q 15 minute checks, follow treatment plan, and give meds as ordered. R: Cooperative on the unit

## 2019-07-30 NOTE — Tx Team (Signed)
Interdisciplinary Treatment and Diagnostic Plan Update  07/30/2019 Time of Session: 9:00am Susan [name] MRN: [id number]  Principal Diagnosis: Polysubstance dependence [facility])  Secondary Diagnoses: Principal Problem:   Polysubstance dependence (HCC) Active Problems:   Substance induced mood disorder (HCC)   MDD (major depressive disorder), recurrent episode, severe (HCC)   Current Medications:  Current Facility-Administered Medications  Medication Dose Route Frequency Provider Last Rate Last Admin  . acetaminophen (TYLENOL) tablet 650 mg  650 mg Oral Q6H PRN Patrcia Dolly, FNP   650 mg at 07/29/19 1626  . acyclovir ointment (ZOVIRAX) 5 %   Topical QID Patrcia Dolly, FNP   1 application at 07/29/19 2103  . alum & mag hydroxide-simeth (MAALOX/MYLANTA) 200-200-20 MG/5ML suspension 30 mL  30 mL Oral Q4H PRN Patrcia Dolly, FNP      . clotrimazole (GYNE-LOTRIMIN) vaginal cream 1 Applicatorful  1 Applicatorful Vaginal QHS Nira Conn A, NP   1 Applicatorful at 07/29/19 2114  . dicyclomine (BENTYL) tablet 20 mg  20 mg Oral Q6H PRN Patrcia Dolly, FNP   20 mg at 07/28/19 2047  . FLUoxetine (PROZAC) capsule 30 mg  30 mg Oral Daily Nwoko, Agnes I, NP      . folic acid (FOLVITE) tablet 1 mg  1 mg Oral Daily Patrcia Dolly, FNP   1 mg at 07/29/19 0736  . gabapentin (NEURONTIN) capsule 400 mg  400 mg Oral TID Patrcia Dolly, FNP   400 mg at 07/29/19 1624  . hydrocerin (EUCERIN) cream   Topical TID PRN Patrcia Dolly, FNP      . hydrOXYzine (ATARAX/VISTARIL) tablet 25 mg  25 mg Oral Q6H PRN Patrcia Dolly, FNP   25 mg at 07/29/19 2103  . ibuprofen (ADVIL) tablet 600 mg  600 mg Oral Q6H PRN Nira Conn A, NP   600 mg at 07/29/19 1948  . loperamide (IMODIUM) capsule 2-4 mg  2-4 mg Oral PRN Patrcia Dolly, FNP      . LORazepam (ATIVAN) tablet 1 mg  1 mg Oral Q6H PRN Patrcia Dolly, FNP   1 mg at 07/28/19 2047  . magnesium hydroxide (MILK OF MAGNESIA) suspension 30 mL  30 mL Oral Daily PRN Patrcia Dolly, FNP      .  methocarbamol (ROBAXIN) tablet 500 mg  500 mg Oral Q8H PRN Patrcia Dolly, FNP   500 mg at 07/29/19 2107  . metroNIDAZOLE (FLAGYL) tablet 500 mg  500 mg Oral BID Armandina Stammer I, NP   500 mg at 07/29/19 1821  . naproxen (NAPROSYN) tablet 500 mg  500 mg Oral BID Patrcia Dolly, FNP   500 mg at 07/29/19 1624  . neomycin-bacitracin-polymyxin (NEOSPORIN) ointment   Topical PRN Nwoko, Agnes I, NP      . nicotine (NICODERM CQ - dosed in mg/24 hours) patch 21 mg  21 mg Transdermal Daily Patrcia Dolly, FNP   21 mg at 07/29/19 0736  . ondansetron (ZOFRAN-ODT) disintegrating tablet 4 mg  4 mg Oral Q6H PRN Patrcia Dolly, FNP   4 mg at 07/29/19 1505  . risperiDONE (RISPERDAL) tablet 2 mg  2 mg Oral QHS Patrcia Dolly, FNP   2 mg at 07/29/19 2103  . sodium chloride (OCEAN) 0.65 % nasal spray 1 spray  1 spray Each Nare PRN Nira Conn A, NP      . thiamine tablet 100 mg  100 mg Oral Daily Patrcia Dolly, FNP   100 mg  at 07/29/19 0736  . traZODone (DESYREL) tablet 200 mg  200 mg Oral QHS Nwoko, Agnes I, NP   200 mg at 07/29/19 2103   PTA Medications: Medications Prior to Admission  Medication Sig Dispense Refill Last Dose  . FLUoxetine (PROZAC) 20 MG capsule Take 1 capsule (20 mg total) by mouth daily. 90 capsule 1   . gabapentin (NEURONTIN) 400 MG capsule Take 1 capsule (400 mg total) by mouth 3 (three) times daily. (Patient not taking: Reported on 04/12/2019) 90 capsule 1   . naproxen (NAPROSYN) 500 MG tablet Take 1 tablet (500 mg total) by mouth 2 (two) times daily. (Patient not taking: Reported on 05/15/2019) 20 tablet 0   . risperiDONE (RISPERDAL) 2 MG tablet Take 1 tablet (2 mg total) by mouth at bedtime. (Patient not taking: Reported on 04/12/2019) 30 tablet 1   . traZODone (DESYREL) 100 MG tablet Take 2 tablets (200 mg total) by mouth at bedtime. 60 tablet 1     Patient Stressors: Financial difficulties Health problems Medication change or noncompliance Substance abuse  Patient Strengths: Capable of independent  living Barrister's clerkCommunication skills Motivation for treatment/growth  Treatment Modalities: Medication Management, Group therapy, Case management,  1 to 1 session with clinician, Psychoeducation, Recreational therapy.   Physician Treatment Plan for Primary Diagnosis: Polysubstance dependence (HCC) Long Term Goal(s):     Short Term Goals:    Medication Management: Evaluate patient's response, side effects, and tolerance of medication regimen.  Therapeutic Interventions: 1 to 1 sessions, Unit Group sessions and Medication administration.  Evaluation of Outcomes: Progressing  Physician Treatment Plan for Secondary Diagnosis: Principal Problem:   Polysubstance dependence (HCC) Active Problems:   Substance induced mood disorder (HCC)   MDD (major depressive disorder), recurrent episode, severe (HCC)  Long Term Goal(s):     Short Term Goals:       Medication Management: Evaluate patient's response, side effects, and tolerance of medication regimen.  Therapeutic Interventions: 1 to 1 sessions, Unit Group sessions and Medication administration.  Evaluation of Outcomes: Progressing   RN Treatment Plan for Primary Diagnosis: Polysubstance dependence (HCC) Long Term Goal(s): Knowledge of disease and therapeutic regimen to maintain health will improve  Short Term Goals: Ability to demonstrate self-control and Ability to participate in decision making will improve  Medication Management: RN will administer medications as ordered by provider, will assess and evaluate patient's response and provide education to patient for prescribed medication. RN will report any adverse and/or side effects to prescribing provider.  Therapeutic Interventions: 1 on 1 counseling sessions, Psychoeducation, Medication administration, Evaluate responses to treatment, Monitor vital signs and CBGs as ordered, Perform/monitor CIWA, COWS, AIMS and Fall Risk screenings as ordered, Perform wound care treatments as  ordered.  Evaluation of Outcomes: Progressing   LCSW Treatment Plan for Primary Diagnosis: Polysubstance dependence (HCC) Long Term Goal(s): Safe transition to appropriate next level of care at discharge, Engage patient in therapeutic group addressing interpersonal concerns.  Short Term Goals: Engage patient in aftercare planning with referrals and resources, Increase social support, Increase emotional regulation, Facilitate patient progression through stages of change regarding substance use diagnoses and concerns and Identify triggers associated with mental health/substance abuse issues  Therapeutic Interventions: Assess for all discharge needs, 1 to 1 time with Social worker, Explore available resources and support systems, Assess for adequacy in community support network, Educate family and significant other(s) on suicide prevention, Complete Psychosocial Assessment, Interpersonal group therapy.  Evaluation of Outcomes: Progressing  Progress in Treatment: Attending groups: Yes. Participating in groups:  Yes. Taking medication as prescribed: Yes. Toleration medication: Yes. Family/Significant other contact made: No, will contact:  mother, [name] Patient understands diagnosis: Yes. Discussing patient identified problems/goals with staff: Yes. Medical problems stabilized or resolved: Yes. Denies suicidal/homicidal ideation: Yes. Issues/concerns per patient self-inventory: Yes.  New problem(s) identified: Yes, Describe:  limited social supports.  New Short Term/Long Term Goal(s): detox, medication management for mood stabilization; elimination of SI thoughts; development of comprehensive mental wellness/sobriety plan.  Patient Goals:  Wants to go to residential substance use treatment.  Discharge Plan or Barriers: Patient has been offered a bed at [facility] tomorrow 12/22.  Reason for Continuation of Hospitalization: Anxiety Depression  Estimated Length of Stay: discharge  tomorrow 12/22  Attendees: Patient: Susan [name] 07/30/2019 10:59 AM  Physician: Queen Blossom 07/30/2019 10:59 AM  Nursing: Benjamine Mola, RN 07/30/2019 10:59 AM  RN Care Manager: 07/30/2019 10:59 AM  Social Worker: Stephanie Acre, [place] 07/30/2019 10:59 AM  Recreational Therapist:  07/30/2019 10:59 AM  Other: Harriett Sine, NP 07/30/2019 10:59 AM  Other:  07/30/2019 10:59 AM  Other: 07/30/2019 10:59 AM    Scribe for Treatment Team: Joellen Jersey, LCSWA 07/30/2019 10:59 AM

## 2019-07-30 NOTE — Discharge Summary (Addendum)
Physician Discharge Summary Note  Patient:  Susan [name] is an 43 y.o., female MRN:  [id number] DOB:  1976/01/11 Patient phone:  [phone] (home)  Patient address:   [place] [address],  Total Time spent with patient: 15 minutes  Date of Admission:  07/27/2019 Date of Discharge: 07/31/2019  Reason for Admission:  suicidal ideation  Principal Problem: Polysubstance dependence [facility]) Discharge Diagnoses: Principal Problem:   Polysubstance dependence ([place]) Active Problems:   Substance induced mood disorder ([place])   MDD (major depressive disorder), recurrent episode, severe ([place])   Past Psychiatric History: Patient's last admission to our facility was on 01/23/2019.  She was diagnosed with cocaine dependence and alcohol dependence.  Also substance-induced mood disorder.  Review of the electronic medical record revealed several emergency room visits for suicidal ideation, as well as substance abuse.  Past Medical History:  Past Medical History:  Diagnosis Date  . Alcohol abuse   . Asthma   . BV (bacterial vaginosis) 03/06/2013  . Chronic dental pain   . Chronic pelvic pain in female   . Depression   . Ectopic pregnancy   . GERD (gastroesophageal reflux disease)   . Panic attacks   . Polysubstance abuse (HCC)    cocaine, opiates, marijuana  . Trichomonas vaginitis     Past Surgical History:  Procedure Laterality Date  . ECTOPIC PREGNANCY SURGERY     Family History:  Family History  Problem Relation Age of Onset  . COPD Mother   . Stroke Mother   . Heart disease Maternal Grandmother   . Hypertension Maternal Grandmother   . Stroke Maternal Grandfather   . Asthma Sister   . Down syndrome Sister    Family Psychiatric  History: Down syndrome and mother is an alcoholic. Social History:  Social History   Substance and Sexual Activity  Alcohol Use Yes  . Alcohol/week: 3.0 - 4.0 standard drinks  . Types: 3 - 4 Cans of beer per week   Comment: drinks about  a 40oz about once a week.      Social History   Substance and Sexual Activity  Drug Use Yes  . Types: Marijuana, Cocaine    Social History   Socioeconomic History  . Marital status: Single    Spouse name: Not on file  . Number of children: Not on file  . Years of education: Not on file  . Highest education level: Not on file  Occupational History  . Not on file  Tobacco Use  . Smoking status: Current Every Day Smoker    Packs/day: 1.00    Years: 26.00    Pack years: 26.00    Types: Cigarettes  . Smokeless tobacco: Never Used  Substance and Sexual Activity  . Alcohol use: Yes    Alcohol/week: 3.0 - 4.0 standard drinks    Types: 3 - 4 Cans of beer per week    Comment: drinks about a 40oz about once a week.   . Drug use: Yes    Types: Marijuana, Cocaine  . Sexual activity: Yes    Birth control/protection: None  Other Topics Concern  . Not on file  Social History Narrative  . Not on file   Social Determinants of Health   Financial Resource Strain:   . Difficulty of Paying Living Expenses: Not on file  Food Insecurity:   . Worried About Charity fundraiser in the Last Year: Not on file  . Ran Out of Food in the Last Year: Not on  file  Transportation Needs:   . Freight forwarder (Medical): Not on file  . Lack of Transportation (Non-Medical): Not on file  Physical Activity:   . Days of Exercise per Week: Not on file  . Minutes of Exercise per Session: Not on file  Stress:   . Feeling of Stress : Not on file  Social Connections:   . Frequency of Communication with Friends and Family: Not on file  . Frequency of Social Gatherings with Friends and Family: Not on file  . Attends Religious Services: Not on file  . Active Member of Clubs or Organizations: Not on file  . Attends Banker Meetings: Not on file  . Marital Status: Not on file    Hospital Course:  From admission H&P: Patient is a 43 year old female with a past psychiatric history  significant for polysubstance dependence who presented to the [facility] emergency department on 07/26/2019 with suicidal ideation. The patient stated she had not received her antidepressant medications for approximately 1 month, and reported increasing depressive symptoms including insomnia, irritability, isolation, fatigue, hopeless, worthless and anhedonia. Her drug screen was positive for opiates, cocaine as well as marijuana. The patient was last admitted to our facility on January 23, 2019. Her diagnosis at that time was suicidal ideation with cocaine and alcohol abuse. She was discharged on fluoxetine, gabapentin, Risperdal and trazodone. She stated she was supposed to follow-up at [facility], but did not. She was admitted to the hospital for evaluation and stabilization.  Ms. Villacres was admitted for polysubstance dependence with suicidal ideation. UDS positive for opioids, cocaine, and THC. She remained on the [facility] unit for four days. She was restarted on Prozac, trazodone, Risperdal, and Neurontin. She participated in group therapy on the unit. She requested referrals for inpatient rehab, and patient has been accepted to residential rehab at [facility]. She responded well to treatment with no adverse effects reported. She has shown improved mood, affect, sleep, and interaction. She denies any SI/HI/AVH and contracts for safety. Denies withdrawal symptoms. She is discharging on the medications listed below. She agrees to follow up at [facility] (see below). Patient is provided with prescriptions and medication samples upon discharge. She is discharging via Lyft to [facility] residential rehab.  Physical Findings: AIMS:  , ,  ,  ,    CIWA:  CIWA-Ar Total: 5 COWS:  COWS Total Score: 0  Musculoskeletal: Strength & Muscle Tone: within normal limits Gait & Station: normal Patient leans: N/A  Psychiatric Specialty Exam: Physical Exam  Nursing note and vitals  reviewed. Constitutional: She is oriented to person, place, and time. She appears well-developed and well-nourished.  Cardiovascular: Normal rate.  Respiratory: Effort normal.  Neurological: She is alert and oriented to person, place, and time.    Review of Systems  Constitutional: Negative.   Respiratory: Negative for cough and shortness of breath.   Psychiatric/Behavioral: Negative for agitation, behavioral problems, hallucinations, self-injury, sleep disturbance and suicidal ideas.    Blood pressure 90/66, pulse 92, temperature 97.7 F (36.5 C), temperature source Oral, resp. rate 16, height  (1.549 m), weight 77.1 kg, SpO2 99 %, unknown if currently breastfeeding.Body mass index is 32.12 kg/m.  See MD's discharge SRA      Has this patient used any form of tobacco in the last 30 days? (Cigarettes, Smokeless Tobacco, Cigars, and/or Pipes) Yes, a prescription for an FDA-approved medication for tobacco cessation was offered at discharge.   Blood Alcohol level:  Lab Results  Component Value Date   ETH <10 07/26/2019   ETH <10 01/22/2019    Metabolic Disorder Labs:  Lab Results  Component Value Date   HGBA1C 5.3 01/06/2017   MPG 105 01/06/2017   Lab Results  Component Value Date   PROLACTIN 146.6 (H) 06/05/2017   PROLACTIN 54.3 (H) 01/06/2017   Lab Results  Component Value Date   CHOL 128 01/23/2019   TRIG 317 (H) 01/23/2019   HDL 40 (L) 01/23/2019   CHOLHDL 3.2 01/23/2019   VLDL 63 (H) 01/23/2019   LDLCALC 25 01/23/2019   LDLCALC 23 06/05/2017    See Psychiatric Specialty Exam and Suicide Risk Assessment completed by Attending Physician prior to discharge.  Discharge destination:  Home  Is patient on multiple antipsychotic therapies at discharge:  No   Has Patient had three or more failed trials of antipsychotic monotherapy by history:  No  Recommended Plan for Multiple Antipsychotic Therapies: NA  Discharge Instructions    Discharge instructions    Complete by: As directed    Patient is instructed to take all prescribed medications as recommended. Report any side effects or adverse reactions to your outpatient psychiatrist. Patient is instructed to abstain from alcohol and illegal drugs while on prescription medications. In the event of worsening symptoms, patient is instructed to call the crisis hotline, 911, or go to the nearest emergency department for evaluation and treatment.     Allergies as of 07/30/2019   No Known Allergies     Medication List    TAKE these medications     Indication  acyclovir ointment 5 % Commonly known as: ZOVIRAX Apply topically 4 (four) times daily. To lip  Indication: Herpes Simplex affecting the Lip   clotrimazole 1 % vaginal cream Commonly known as: GYNE-LOTRIMIN Place 1 Applicatorful vaginally at bedtime for 4 days.  Indication: Vagina and Vulva Infection due to Candida Species Fungus   FLUoxetine 10 MG capsule Commonly known as: PROZAC Take 3 capsules (30 mg total) by mouth daily. Start taking on: July 31, 2019 What changed:   medication strength  how much to take  Indication: Depression   gabapentin 400 MG capsule Commonly known as: NEURONTIN Take 1 capsule (400 mg total) by mouth 3 (three) times daily.  Indication: Abuse or Misuse of Alcohol   hydrocerin Crea Apply 1 application topically 3 (three) times daily as needed (eczema).  Indication: eczema   hydrOXYzine 25 MG tablet Commonly known as: ATARAX/VISTARIL Take 1 tablet (25 mg total) by mouth every 6 (six) hours as needed for anxiety.  Indication: Feeling Anxious   metroNIDAZOLE 500 MG tablet Commonly known as: FLAGYL Take 1 tablet (500 mg total) by mouth 2 (two) times daily.  Indication: Vaginosis caused by Bacteria   naproxen 500 MG tablet Commonly known as: NAPROSYN Take 1 tablet (500 mg total) by mouth 2 (two) times daily.  Indication: Pain   neomycin-bacitracin-polymyxin Oint Commonly known as:  NEOSPORIN Apply 1 application topically as needed for irritation.  Indication: skin irritation   nicotine 21 mg/24hr patch Commonly known as: NICODERM CQ - dosed in mg/24 hours Place 1 patch (21 mg total) onto the skin daily. Start taking on: July 31, 2019  Indication: Nicotine Addiction   risperiDONE 2 MG tablet Commonly known as: RISPERDAL Take 1 tablet (2 mg total) by mouth at bedtime.  Indication: Hypomanic Episode of Bipolar Disorder   traZODone 100 MG tablet Commonly known as: DESYREL Take 2 tablets (200 mg total) by mouth at bedtime.  Indication:  Trouble Sleeping      Follow-up Information    Services, [facility]. Go on 07/31/2019.   Why: You have been accepted for admission on 07/31/19 at 7:45am. Please be sure to bring clothing, any discharge paperwork and your 14-day supply of medications along with your 30 day prescriptions.  Contact information: [address] [place] [address] [phone]        [facility] Follow up.   Why: Upon completion of residential program, please follow up for outpatient medication management and therapy services. Walk-in hours are Monday-Friday from 8:00am-5:00pm. Be sure to follow up within 3-[facility] days [facility].  Contact information: [address] [place] [id number] [phone]           Follow-up recommendations: Activity as tolerated. Diet as recommended by primary care physician. Keep all scheduled follow-up appointments as recommended.   Comments:   Patient is instructed to take all prescribed medications as recommended. Report any side effects or adverse reactions to your outpatient psychiatrist. Patient is instructed to abstain from alcohol and illegal drugs while on prescription medications. In the event of worsening symptoms, patient is instructed to call the crisis hotline, 911, or go to the nearest emergency department for evaluation and treatment.  Signed: Aldean BakerJanet E Pietro Bonura,  NP 07/30/2019, 2:59 PM

## 2019-07-30 NOTE — Progress Notes (Signed)
Patient has been accepted to [facility] for continuity of care and treatment. Patient will be transported to [facility] at 7:00am by [facility] ride.   Patient will need 14 day supply of medications and her 30 day prescriptions.   Please be sure the patient is discharged by 7:00am, in order to be on time for her admission appointment at 7:45am.   Dr. Myles Lipps, MD notified RN notified.     Radonna Ricker, MSW, LCSW Clinical Social Worker [facility]  Phone: [phone]

## 2019-07-30 NOTE — Progress Notes (Signed)
Recreation Therapy Notes  Date:  12.21.20 Time: 0930 Location: [address] Dayroom  Group Topic: Stress Management  Goal Area(s) Addresses:  Patient will identify positive stress management techniques. Patient will identify benefits of using stress management post d/c.  Intervention: Stress Management  Activity :  Meditation.  LRT played a meditation that focused on making the most of your day.  Patients were to follow along as meditation played to fully engage in activity.  Education:  Stress Management, Discharge Planning.   Education Outcome: Acknowledges Education  Clinical Observations/Feedback:  Pt did not attend group.    Victorino Sparrow, LRT/CTRS    Victorino Sparrow A 07/30/2019 10:49 AM

## 2019-07-30 NOTE — [facility] Suicide Risk Assessment (Signed)
[facility] INPATIENT:  Family/Significant Other Suicide Prevention Education  Suicide Prevention Education:  Education Completed; mother, Kellsie Grindle [phone] has been identified by the patient as the family member/significant other with whom the patient will be residing, and identified as the person(s) who will aid the patient in the event of a mental health crisis (suicidal ideations/suicide attempt).  With written consent from the patient, the family member/significant other has been provided the following suicide prevention education, prior to the and/or following the discharge of the patient.  The suicide prevention education provided includes the following:  Suicide risk factors  Suicide prevention and interventions  National Suicide Hotline telephone number  [facility] assessment telephone number  [facility] Emergency Assistance [place] and/or Residential Mobile Crisis Unit telephone number  Request made of family/significant other to:  Remove weapons (e.g., guns, rifles, knives), all items previously/currently identified as safety concern.    Remove drugs/medications (over-the-counter, prescriptions, illicit drugs), all items previously/currently identified as a safety concern.  The family member/significant other verbalizes understanding of the suicide prevention education information provided.  The family member/significant other agrees to remove the items of safety concern listed above.  Mother reports she has been worried about the patient for the last year, due to her escalating behavioral issues and worsening polysubstance use. Mother is in poor health and has had to move several times, due to the patient's behaviors. Mother wants patient to get treatment but reports the patient cannot live with her once she completes treatment at [facility].   Mother is aware that patient has a screening at [facility] tomorrow morning.  No  additional questions or concerns.   Joellen Jersey 07/30/2019, 2:45 PM

## 2019-07-30 NOTE — [facility] Group Notes (Signed)
Type of Therapy and Topic: Group Therapy: Core Beliefs  Participation Level: Did Not Attend  Description of Group: In this group patients will be encouraged to explore their negative and positive core beliefs about themselves, others, and the world. Each patient will be challenged to identify these beliefs and ways to challenge negative core beliefs. This group will be process-oriented, with patients participating in exploration of their own experiences as well as giving and receiving support and challenge from other group members.  Therapeutic Goals: 1. Patient will identify personal core beliefs, both negative and positive. 2. Patient will identify core beliefs relating to others, both negative and positive. 3. Patient will challenge their negative beliefs about themselves and others. 4. Patient will identify three changes they can make to replace negative core beliefs with positive beliefs.  Summary of Patient Progress  Due to the COVID-19 pandemic, this group has been supplemented with worksheets.   Kevina Piloto, MSW, LCSW Clinical Social Worker [facility]  Phone: [phone]  

## 2019-07-30 NOTE — Progress Notes (Signed)
[facility] MD Progress Note  07/30/2019 11:57 AM Susan [name]  MRN:  [id number] Subjective: Patient is a 43 year old female with a past psychiatric history significant for polysubstance dependence who presented to the [facility] emergency department on 07/26/2019 with suicidal ideation.  Objective: Patient is seen and examined.  Patient is a 43 year old female with the above-stated past psychiatric history seen in follow-up.  She is essentially unchanged.  She has multiple complaints.  Social work has been able to get her into a residential treatment program, and she will be discharged tomorrow morning for that program.  She did request increase of her medications, but I explained to her that the Prozac was increased to 30 mg shortly after admission.  The way that the medication would work with not be beneficial increasing the dosage at this point.  She denied any suicidal ideation.  Her vital signs are stable, she is afebrile.  She is not showing any signs or symptoms of withdrawal.  She slept 6.5 hours last night.  Review of her laboratories showed no new labs since 12/17.  Drug screen on admission was positive for opiates, cocaine and marijuana.  Principal Problem: Polysubstance dependence (HCC) Diagnosis: Principal Problem:   Polysubstance dependence (HCC) Active Problems:   Substance induced mood disorder (HCC)   MDD (major depressive disorder), recurrent episode, severe (HCC)  Total Time spent with patient: 20 minutes  Past Psychiatric History: See admission H&P  Past Medical History:  Past Medical History:  Diagnosis Date  . Alcohol abuse   . Asthma   . BV (bacterial vaginosis) 03/06/2013  . Chronic dental pain   . Chronic pelvic pain in female   . Depression   . Ectopic pregnancy   . GERD (gastroesophageal reflux disease)   . Panic attacks   . Polysubstance abuse (HCC)    cocaine, opiates, marijuana  . Trichomonas vaginitis     Past Surgical History:   Procedure Laterality Date  . ECTOPIC PREGNANCY SURGERY     Family History:  Family History  Problem Relation Age of Onset  . COPD Mother   . Stroke Mother   . Heart disease Maternal Grandmother   . Hypertension Maternal Grandmother   . Stroke Maternal Grandfather   . Asthma Sister   . Down syndrome Sister    Family Psychiatric  History: See admission H&P Social History:  Social History   Substance and Sexual Activity  Alcohol Use Yes  . Alcohol/week: 3.0 - 4.0 standard drinks  . Types: 3 - 4 Cans of beer per week   Comment: drinks about a 40oz about once a week.      Social History   Substance and Sexual Activity  Drug Use Yes  . Types: Marijuana, Cocaine    Social History   Socioeconomic History  . Marital status: Single    Spouse name: Not on file  . Number of children: Not on file  . Years of education: Not on file  . Highest education level: Not on file  Occupational History  . Not on file  Tobacco Use  . Smoking status: Current Every Day Smoker    Packs/day: 1.00    Years: 26.00    Pack years: 26.00    Types: Cigarettes  . Smokeless tobacco: Never Used  Substance and Sexual Activity  . Alcohol use: Yes    Alcohol/week: 3.0 - 4.0 standard drinks    Types: 3 - 4 Cans of beer per week    Comment: drinks about  a 40oz about once a week.   . Drug use: Yes    Types: Marijuana, Cocaine  . Sexual activity: Yes    Birth control/protection: None  Other Topics Concern  . Not on file  Social History Narrative  . Not on file   Social Determinants of Health   Financial Resource Strain:   . Difficulty of Paying Living Expenses: Not on file  Food Insecurity:   . Worried About Programme researcher, broadcasting/film/video in the Last Year: Not on file  . Ran Out of Food in the Last Year: Not on file  Transportation Needs:   . Lack of Transportation (Medical): Not on file  . Lack of Transportation (Non-Medical): Not on file  Physical Activity:   . Days of Exercise per Week: Not on  file  . Minutes of Exercise per Session: Not on file  Stress:   . Feeling of Stress : Not on file  Social Connections:   . Frequency of Communication with Friends and Family: Not on file  . Frequency of Social Gatherings with Friends and Family: Not on file  . Attends Religious Services: Not on file  . Active Member of Clubs or Organizations: Not on file  . Attends Banker Meetings: Not on file  . Marital Status: Not on file   Additional Social History:                         Sleep: Good  Appetite:  Fair  Current Medications: Current Facility-Administered Medications  Medication Dose Route Frequency Provider Last Rate Last Admin  . acetaminophen (TYLENOL) tablet 650 mg  650 mg Oral Q6H PRN Patrcia Dolly, FNP   650 mg at 07/29/19 1626  . acyclovir ointment (ZOVIRAX) 5 %   Topical QID Patrcia Dolly, FNP   1 application at 07/29/19 2103  . alum & mag hydroxide-simeth (MAALOX/MYLANTA) 200-200-20 MG/5ML suspension 30 mL  30 mL Oral Q4H PRN Patrcia Dolly, FNP      . clotrimazole (GYNE-LOTRIMIN) vaginal cream 1 Applicatorful  1 Applicatorful Vaginal QHS Nira Conn A, NP   1 Applicatorful at 07/29/19 2114  . dicyclomine (BENTYL) tablet 20 mg  20 mg Oral Q6H PRN Patrcia Dolly, FNP   20 mg at 07/28/19 2047  . FLUoxetine (PROZAC) capsule 30 mg  30 mg Oral Daily Nwoko, Agnes I, NP      . folic acid (FOLVITE) tablet 1 mg  1 mg Oral Daily Patrcia Dolly, FNP   1 mg at 07/29/19 0736  . gabapentin (NEURONTIN) capsule 400 mg  400 mg Oral TID Patrcia Dolly, FNP   400 mg at 07/29/19 1624  . hydrocerin (EUCERIN) cream   Topical TID PRN Patrcia Dolly, FNP      . hydrOXYzine (ATARAX/VISTARIL) tablet 25 mg  25 mg Oral Q6H PRN Patrcia Dolly, FNP   25 mg at 07/29/19 2103  . ibuprofen (ADVIL) tablet 600 mg  600 mg Oral Q6H PRN Nira Conn A, NP   600 mg at 07/29/19 1948  . loperamide (IMODIUM) capsule 2-4 mg  2-4 mg Oral PRN Patrcia Dolly, FNP      . LORazepam (ATIVAN) tablet 1 mg  1 mg Oral  Q6H PRN Patrcia Dolly, FNP   1 mg at 07/28/19 2047  . magnesium hydroxide (MILK OF MAGNESIA) suspension 30 mL  30 mL Oral Daily PRN Patrcia Dolly, FNP      .  methocarbamol (ROBAXIN) tablet 500 mg  500 mg Oral Q8H PRN Patrcia Dollyate, Tina L, FNP   500 mg at 07/29/19 2107  . metroNIDAZOLE (FLAGYL) tablet 500 mg  500 mg Oral BID Armandina StammerNwoko, Agnes I, NP   500 mg at 07/29/19 1821  . naproxen (NAPROSYN) tablet 500 mg  500 mg Oral BID Patrcia Dollyate, Tina L, FNP   500 mg at 07/29/19 1624  . neomycin-bacitracin-polymyxin (NEOSPORIN) ointment   Topical PRN Nwoko, Agnes I, NP      . nicotine (NICODERM CQ - dosed in mg/24 hours) patch 21 mg  21 mg Transdermal Daily Patrcia Dollyate, Tina L, FNP   21 mg at 07/29/19 0736  . ondansetron (ZOFRAN-ODT) disintegrating tablet 4 mg  4 mg Oral Q6H PRN Patrcia Dollyate, Tina L, FNP   4 mg at 07/29/19 1505  . risperiDONE (RISPERDAL) tablet 2 mg  2 mg Oral QHS Patrcia Dollyate, Tina L, FNP   2 mg at 07/29/19 2103  . sodium chloride (OCEAN) 0.65 % nasal spray 1 spray  1 spray Each Nare PRN Nira ConnBerry, Jason A, NP      . thiamine tablet 100 mg  100 mg Oral Daily Patrcia Dollyate, Tina L, FNP   100 mg at 07/29/19 0736  . traZODone (DESYREL) tablet 200 mg  200 mg Oral QHS Nwoko, Agnes I, NP   200 mg at 07/29/19 2103    Lab Results: No results found for this or any previous visit (from the past 48 hour(s)).  Blood Alcohol level:  Lab Results  Component Value Date   ETH <10 07/26/2019   ETH <10 01/22/2019    Metabolic Disorder Labs: Lab Results  Component Value Date   HGBA1C 5.3 01/06/2017   MPG 105 01/06/2017   Lab Results  Component Value Date   PROLACTIN 146.6 (H) 06/05/2017   PROLACTIN 54.3 (H) 01/06/2017   Lab Results  Component Value Date   CHOL 128 01/23/2019   TRIG 317 (H) 01/23/2019   HDL 40 (L) 01/23/2019   CHOLHDL 3.2 01/23/2019   VLDL 63 (H) 01/23/2019   LDLCALC 25 01/23/2019   LDLCALC 23 06/05/2017    Physical Findings: AIMS:  , ,  ,  ,    CIWA:  CIWA-Ar Total: 5 COWS:  COWS Total Score:  0  Musculoskeletal: Strength & Muscle Tone: within normal limits Gait & Station: normal Patient leans: N/A  Psychiatric Specialty Exam: Physical Exam  Nursing note and vitals reviewed. Constitutional: She appears well-developed and well-nourished.  HENT:  Head: Normocephalic and atraumatic.  Respiratory: Effort normal.  Neurological: She is alert.    Review of Systems  Blood pressure 90/66, pulse 92, temperature 97.7 F (36.5 C), temperature source Oral, resp. rate 16, height 5\' 1"  (1.549 m), weight 77.1 kg, SpO2 99 %, unknown if currently breastfeeding.Body mass index is 32.12 kg/m.  General Appearance: Disheveled  Eye Contact:  Fair  Speech:  Normal Rate  Volume:  Decreased  Mood:  Anxious, Depressed and Dysphoric  Affect:  Congruent  Thought Process:  Coherent and Descriptions of Associations: Circumstantial  Orientation:  Full (Time, Place, and Person)  Thought Content:  Logical  Suicidal Thoughts:  No  Homicidal Thoughts:  No  Memory:  Immediate;   Fair Recent;   Fair Remote;   Fair  Judgement:  Intact  Insight:  Lacking  Psychomotor Activity:  Increased  Concentration:  Concentration: Fair and Attention Span: Fair  Recall:  [employer]  Fund of Knowledge:  Fair  Language:  Good  Akathisia:  Negative  Handed:  Right  AIMS (if indicated):     Assets:  Desire for Improvement Resilience  ADL's:  Intact  Cognition:  WNL  Sleep:  Number of Hours: 6.5     Treatment Plan Summary: Daily contact with patient to assess and evaluate symptoms and progress in treatment, Medication management and Plan : Patient is seen and examined.  Patient is a 43 year old female with the above-stated past psychiatric history who is seen in follow-up.  Diagnosis: #1 polysubstance dependence, #2 major depression versus substance-induced mood disorder  Patient is seen in follow-up.  She is stable at this point.  She is not suicidal.  She has been accepted into a substance abuse treatment  program and will be available for entry into that program on Wednesday a.m.  No change in her medications today. 1.  Continue acyclovir ointment 4 times a day for herpes infection. 2.  Continue clotrimazole vaginal cream 1 applicatorful daily for vaginal infection. 3.  Continue opiate detox protocol. 4.  Continue fluoxetine 30 mg p.o. daily for anxiety and depression. 5.  Continue folic acid 1 mg p.o. daily for nutritional supplementation. 6.  Continue gabapentin 400 mg p.o. 3 times daily for anxiety and chronic pain. 7.  Continue hydroxyzine 25 mg p.o. every 6 hours as needed anxiety. 8.  Stop ibuprofen. 9.  Stop lorazepam. 10.  Continue Naprosyn 500 mg p.o. twice daily for chronic pain. 11.  Continue Neosporin ointment as needed for topical infection. 12.  Continue Risperdal 2 mg p.o. nightly for sleep and psychosis. 13.  Continue thiamine 100 mg p.o. daily for nutritional supplementation. 14.  Continue trazodone 200 mg p.o. nightly for sleep. 15.  Disposition planning-to go to residential substance abuse program on 08/01/2019. Sharma Covert, MD 07/30/2019, 11:57 AM

## 2019-07-30 NOTE — [facility] Suicide Risk Assessment (Signed)
[facility] Discharge Suicide Risk Assessment   Principal Problem: Polysubstance dependence [facility]) Discharge Diagnoses: Principal Problem:   Polysubstance dependence ([place]) Active Problems:   Substance induced mood disorder ([place])   MDD (major depressive disorder), recurrent episode, severe ([place])   Total Time spent with patient: 20 minutes  Musculoskeletal: Strength & Muscle Tone: within normal limits Gait & Station: normal Patient leans: N/A  Psychiatric Specialty Exam: Review of Systems  All other systems reviewed and are negative.   Blood pressure 90/66, pulse 92, temperature 97.7 F (36.5 C), temperature source Oral, resp. rate 16, height 5\' 1"  (1.549 m), weight 77.1 kg, SpO2 99 %, unknown if currently breastfeeding.Body mass index is 32.12 kg/m.  General Appearance: Disheveled  Eye Sport and exercise psychologist::  Fair  Speech:  Normal Rate409  Volume:  Normal  Mood:  Anxious  Affect:  Congruent  Thought Process:  Coherent and Descriptions of Associations: Intact  Orientation:  Full (Time, Place, and Person)  Thought Content:  Logical  Suicidal Thoughts:  No  Homicidal Thoughts:  No  Memory:  Immediate;   Fair Recent;   Fair Remote;   Fair  Judgement:  Intact  Insight:  Lacking  Psychomotor Activity:  Normal  Concentration:  Fair  Recall:  [employer] of Knowledge:Fair  Language: Good  Akathisia:  Negative  Handed:  Right  AIMS (if indicated):     Assets:  Desire for Improvement Resilience  Sleep:  Number of Hours: 6.5  Cognition: WNL  ADL's:  Intact   Mental Status Per Nursing Assessment::   On Admission:  Suicidal ideation indicated by patient  Demographic Factors:  Divorced or widowed, Caucasian, Low socioeconomic status, Living alone and Unemployed  Loss Factors: NA  Historical Factors: Impulsivity  Risk Reduction Factors:   NA  Continued Clinical Symptoms:  Bipolar Disorder:   Mixed State Depression:   Comorbid alcohol abuse/dependence Impulsivity Alcohol/Substance  Abuse/Dependencies  Cognitive Features That Contribute To Risk:  None    Suicide Risk:  Minimal: No identifiable suicidal ideation.  Patients presenting with no risk factors but with morbid ruminations; may be classified as minimal risk based on the severity of the depressive symptoms  Follow-up Information    Services, [facility]. Go on 07/31/2019.   Why: You have been accepted for admission on 07/31/19 at 7:45am. Please be sure to bring clothing, any discharge paperwork and your 14-day supply of medications along with your 30 day prescriptions.  Contact information: [place] [phone]        [facility] Follow up.   Why: Upon completion of residential program, please follow up for outpatient medication management and therapy services. Walk-in hours are Monday-Friday from 8:00am-5:00pm. Be sure to follow up within 3-5 days of discharge from [place] information: [place] [id number] [phone]           Plan Of Care/Follow-up recommendations:  Activity:  ad lib  Sharma Covert, MD 07/30/2019, 2:01 PM

## 2019-07-31 NOTE — Plan of Care (Signed)
Guest completed all goals here and will be going to [facility] for longer treatment.

## 2019-07-31 NOTE — Progress Notes (Signed)
Patient ID: Susan [name], female   DOB: Jan 28, 1976, 42 y.o.   MRN: [id number]   [name] obtained all her belongings, medications, prescriptions, and discharge instructions. Placed in the lobby with breakfast tray to wait for Lyft driver to take her to [facility].

## 2020-01-17 ENCOUNTER — Encounter (HOSPITAL_COMMUNITY): Payer: Self-pay

## 2020-01-17 ENCOUNTER — Emergency Department (HOSPITAL_COMMUNITY)
Admission: EM | Admit: 2020-01-17 | Discharge: 2020-01-17 | Disposition: A | Discharge status: Home / Self Care | Payer: Self-pay | Attending: Emergency Medicine | Admitting: Emergency Medicine

## 2020-01-17 ENCOUNTER — Emergency Department (HOSPITAL_COMMUNITY): Payer: Self-pay

## 2020-01-17 DIAGNOSIS — J45909 Unspecified asthma, uncomplicated: Secondary | ICD-10-CM | POA: Insufficient documentation

## 2020-01-17 DIAGNOSIS — R112 Nausea with vomiting, unspecified: Secondary | ICD-10-CM

## 2020-01-17 DIAGNOSIS — K2900 Acute gastritis without bleeding: Secondary | ICD-10-CM | POA: Insufficient documentation

## 2020-01-17 DIAGNOSIS — R1013 Epigastric pain: Secondary | ICD-10-CM

## 2020-01-17 DIAGNOSIS — F17210 Nicotine dependence, cigarettes, uncomplicated: Secondary | ICD-10-CM | POA: Insufficient documentation

## 2020-01-17 LAB — COMPREHENSIVE METABOLIC PANEL
ALT: 43 U/L (ref 0–44)
AST: 36 U/L (ref 15–41)
Albumin: 4.4 g/dL (ref 3.5–5.0)
Alkaline Phosphatase: 64 U/L (ref 38–126)
Anion gap: 12 (ref 5–15)
BUN: 11 mg/dL (ref 6–20)
CO2: 23 mmol/L (ref 22–32)
Calcium: 9.2 mg/dL (ref 8.9–10.3)
Chloride: 102 mmol/L (ref 98–111)
Creatinine, Ser: 0.62 mg/dL (ref 0.44–1.00)
GFR calc Af Amer: >60 mL/min (ref >60)
GFR calc non Af Amer: >60 mL/min (ref >60)
Glucose, Bld: 137 mg/dL — ABNORMAL HIGH (ref 70–99)
Potassium: 3.5 mmol/L (ref 3.5–5.1)
Sodium: 137 mmol/L (ref 135–145)
Total Bilirubin: 0.9 mg/dL (ref 0.3–1.2)
Total Protein: 7.6 g/dL (ref 6.5–8.1)

## 2020-01-17 LAB — CBC
HCT: 39.3 % (ref 36.0–46.0)
Hemoglobin: 12.9 g/dL (ref 12.0–15.0)
MCH: 27.2 pg (ref 26.0–34.0)
MCHC: 32.8 g/dL (ref 30.0–36.0)
MCV: 82.7 fL (ref 80.0–100.0)
Platelets: 244 10*3/uL (ref 150–400)
RBC: 4.75 MIL/uL (ref 3.87–5.11)
RDW: 13.3 % (ref 11.5–15.5)
WBC: 11.4 10*3/uL — ABNORMAL HIGH (ref 4.0–10.5)
nRBC: 0 % (ref 0.0–0.2)

## 2020-01-17 LAB — URINALYSIS, ROUTINE W REFLEX MICROSCOPIC
Bilirubin Urine: NEGATIVE
Glucose, UA: NEGATIVE mg/dL
Hgb urine dipstick: NEGATIVE
Ketones, ur: 5 mg/dL — AB
Leukocytes,Ua: NEGATIVE
Nitrite: NEGATIVE
Protein, ur: NEGATIVE mg/dL
Specific Gravity, Urine: 1.005 (ref 1.005–1.030)
pH: 6 (ref 5.0–8.0)

## 2020-01-17 LAB — HCG, QUANTITATIVE, PREGNANCY: hCG, Beta Chain, Quant, S: <1 m[IU]/mL (ref <5)

## 2020-01-17 LAB — LIPASE, BLOOD: Lipase: 23 U/L (ref 11–51)

## 2020-01-17 MED ORDER — PROMETHAZINE HCL 25 MG PO TABS
25.0000 mg | ORAL_TABLET | Freq: Four times a day (QID) | ORAL | 0 refills | Status: DC | PRN
Start: 2020-01-17 — End: 2020-01-17

## 2020-01-17 MED ORDER — ALUM & MAG HYDROXIDE-SIMETH 200-200-20 MG/5ML PO SUSP
30.0000 mL | Freq: Once | ORAL | Status: AC
  Administered 2020-01-17: 30 mL via ORAL
  Filled 2020-01-17: qty 30

## 2020-01-17 MED ORDER — IOHEXOL 300 MG/ML  SOLN
100.0000 mL | Freq: Once | INTRAMUSCULAR | Status: AC | PRN
  Administered 2020-01-17: 100 mL via INTRAVENOUS

## 2020-01-17 MED ORDER — LIDOCAINE VISCOUS HCL 2 % MT SOLN
15.0000 mL | Freq: Once | OROMUCOSAL | Status: AC
  Administered 2020-01-17: 15 mL via ORAL
  Filled 2020-01-17: qty 15

## 2020-01-17 MED ORDER — PROMETHAZINE HCL 25 MG PO TABS
25.0000 mg | ORAL_TABLET | Freq: Four times a day (QID) | ORAL | 0 refills | Status: DC | PRN
Start: 2020-01-17 — End: 2020-10-10

## 2020-01-17 MED ORDER — MORPHINE SULFATE (PF) 4 MG/ML IV SOLN
4.0000 mg | Freq: Once | INTRAVENOUS | Status: AC
  Administered 2020-01-17: 4 mg via INTRAVENOUS
  Filled 2020-01-17: qty 1

## 2020-01-17 MED ORDER — ONDANSETRON HCL 4 MG/2ML IJ SOLN
4.0000 mg | Freq: Once | INTRAMUSCULAR | Status: AC
  Administered 2020-01-17: 4 mg via INTRAVENOUS
  Filled 2020-01-17: qty 2

## 2020-01-17 MED ORDER — PANTOPRAZOLE SODIUM 20 MG PO TBEC
40.0000 mg | DELAYED_RELEASE_TABLET | Freq: Every day | ORAL | 0 refills | Status: DC
Start: 2020-01-17 — End: 2020-01-17

## 2020-01-17 MED ORDER — SODIUM CHLORIDE 0.9 % IV BOLUS
1000.0000 mL | Freq: Once | INTRAVENOUS | Status: AC
  Administered 2020-01-17: 1000 mL via INTRAVENOUS

## 2020-01-17 MED ORDER — SODIUM CHLORIDE 0.9% FLUSH: 3.0000 mL | Freq: Once | INTRAVENOUS | Status: DC

## 2020-01-17 MED ORDER — PANTOPRAZOLE SODIUM 20 MG PO TBEC
40.0000 mg | DELAYED_RELEASE_TABLET | Freq: Every day | ORAL | 0 refills | Status: DC
Start: 2020-01-17 — End: 2021-06-25

## 2020-01-17 NOTE — ED Provider Notes (Signed)
[facility] DEPT Provider Note   CSN: [id number] Arrival date & time: 01/17/20  1503     History Chief Complaint  Patient presents with  . Abdominal Pain    Susan [name] is a 44 y.o. female.  HPI     Periumbilical/epigastric abdominal pain, rated 9/10. Started a month ago but getting worse. Constant pain, not changed by eating Has not been eating that much because of nausea Nausea began yesterday and getting worse, threw up one time Possible constipation, last BM was yesterday, did pass flatus once today No fever, has had cold sweats, chills No urinary symptoms, does have strong odor No vaginal bleeding or discharge Low appetite  Only surgery hx was ectopic  Have not had period in 2 mos, pregnancy test was negative   Past Medical History:  Diagnosis Date  . Alcohol abuse   . Asthma   . BV (bacterial vaginosis) 03/06/2013  . Chronic dental pain   . Chronic pelvic pain in female   . Depression   . Ectopic pregnancy   . GERD (gastroesophageal reflux disease)   . Panic attacks   . Polysubstance abuse (HCC)    cocaine, opiates, marijuana  . Trichomonas vaginitis     Patient Active Problem List   Diagnosis Date Noted  . Polysubstance dependence ([place]) 07/27/2019  . MDD (major depressive disorder), recurrent episode, severe ([place]) 01/23/2019  . PTSD (post-traumatic stress disorder) 06/07/2017  . Substance induced mood disorder ([place]) 06/04/2017  . Preterm labor 04/10/2017  . Asymptomatic bacteriuria during pregnancy in second trimester 03/14/2017  . Late prenatal care in second trimester 03/08/2017  . Supervision of normal pregnancy 03/08/2017  . MDD (major depressive disorder), single episode, severe with psychotic features ([place]) 01/05/2017  . Alcohol use disorder, severe, dependence ([place]) 01/05/2017  . Cocaine use disorder, moderate, dependence ([place]) 01/05/2017  . Cannabis use disorder, moderate, dependence ([place]) 01/05/2017  .  Heavy smoker 02/03/2016  . Cocaine abuse affecting pregnancy, antepartum ([place]) 02/03/2016  . Chronic, continuous use of opioids 03/24/2015  . Depression with anxiety 03/24/2015  . Poor dentition 03/24/2015  . Sprain of ankle, unspecified site 05/28/2009    Past Surgical History:  Procedure Laterality Date  . ECTOPIC PREGNANCY SURGERY       OB History    Gravida  9   Para  5   Term  4   Preterm  1   AB  4   Living  5     SAB  1   TAB  2   Ectopic  1   Multiple  0   Live Births  5           Family History  Problem Relation Age of Onset  . COPD Mother   . Stroke Mother   . Heart disease Maternal Grandmother   . Hypertension Maternal Grandmother   . Stroke Maternal Grandfather   . Asthma Sister   . Down syndrome Sister     Social History   Tobacco Use  . Smoking status: Current Every Day Smoker    Packs/day: 1.00    Years: 26.00    Pack years: 26.00    Types: Cigarettes  . Smokeless tobacco: Never Used  Vaping Use  . Vaping Use: Never used  Substance Use Topics  . Alcohol use: Yes    Alcohol/week: 3.0 - 4.0 standard drinks    Types: 3 - 4 Cans of beer per week    Comment: drinks about a 40oz  about once a week.   . Drug use: Yes    Types: Marijuana, Cocaine    Home Medications Prior to Admission medications   Medication Sig Start Date End Date Taking? Authorizing Provider  acyclovir ointment (ZOVIRAX) 5 % Apply topically 4 (four) times daily. To lip 07/30/19   Aldean Baker, NP  FLUoxetine (PROZAC) 10 MG capsule Take 3 capsules (30 mg total) by mouth daily. 07/31/19   Aldean Baker, NP  gabapentin (NEURONTIN) 400 MG capsule Take 1 capsule (400 mg total) by mouth 3 (three) times daily. 07/30/19   Aldean Baker, NP  hydrocerin (EUCERIN) CREA Apply 1 application topically 3 (three) times daily as needed (eczema). 07/30/19   Aldean Baker, NP  hydrOXYzine (ATARAX/VISTARIL) 25 MG tablet Take 1 tablet (25 mg total) by mouth every 6 (six) hours  as needed for anxiety. 07/30/19   Aldean Baker, NP  metroNIDAZOLE (FLAGYL) 500 MG tablet Take 1 tablet (500 mg total) by mouth 2 (two) times daily. 07/30/19   Aldean Baker, NP  naproxen (NAPROSYN) 500 MG tablet Take 1 tablet (500 mg total) by mouth 2 (two) times daily. 07/30/19   Aldean Baker, NP  neomycin-bacitracin-polymyxin (NEOSPORIN) OINT Apply 1 application topically as needed for irritation. 07/30/19   Aldean Baker, NP  nicotine (NICODERM CQ - DOSED IN MG/24 HOURS) 21 mg/24hr patch Place 1 patch (21 mg total) onto the skin daily. 07/31/19   Aldean Baker, NP  pantoprazole (PROTONIX) 20 MG tablet Take 2 tablets (40 mg total) by mouth daily for 14 days. 01/17/20 01/31/20  Alvira Monday, MD  promethazine (PHENERGAN) 25 MG tablet Take 1 tablet (25 mg total) by mouth every 6 (six) hours as needed for nausea or vomiting. 01/17/20   Alvira Monday, MD  risperiDONE (RISPERDAL) 2 MG tablet Take 1 tablet (2 mg total) by mouth at bedtime. 07/30/19   Aldean Baker, NP  traZODone (DESYREL) 100 MG tablet Take 2 tablets (200 mg total) by mouth at bedtime. 07/30/19   Aldean Baker, NP    Allergies    Patient has no known allergies.  Review of Systems   Review of Systems  Constitutional: Positive for appetite change and chills. Negative for fever.  HENT: Negative for sore throat.   Eyes: Negative for visual disturbance.  Respiratory: Negative for cough and shortness of breath.   Cardiovascular: Negative for chest pain.  Gastrointestinal: Positive for abdominal pain, constipation, nausea and vomiting.  Genitourinary: Negative for difficulty urinating, dysuria, vaginal bleeding and vaginal discharge.  Musculoskeletal: Positive for back pain. Negative for neck pain.  Skin: Negative for rash.  Neurological: Negative for syncope and headaches.    Physical Exam Updated Vital Signs BP 108/76   Pulse 82   Temp 98.7 F (37.1 C) (Oral)   Resp 16   LMP 05/24/2019 (Exact Date)   SpO2 99%     Physical Exam Vitals and nursing note reviewed.  Constitutional:      General: She is not in acute distress.    Appearance: She is well-developed. She is not diaphoretic.  HENT:     Head: Normocephalic and atraumatic.  Eyes:     Conjunctiva/sclera: Conjunctivae normal.  Cardiovascular:     Rate and Rhythm: Normal rate and regular rhythm.     Heart sounds: Normal heart sounds. No murmur heard.  No friction rub. No gallop.   Pulmonary:     Effort: Pulmonary effort is normal. No respiratory distress.  Breath sounds: Normal breath sounds. No wheezing or rales.  Abdominal:     General: There is no distension.     Palpations: Abdomen is soft.     Tenderness: There is no abdominal tenderness. There is no guarding.  Musculoskeletal:        General: No tenderness.     Cervical back: Normal range of motion.  Skin:    General: Skin is warm and dry.     Findings: No erythema or rash.  Neurological:     Mental Status: She is alert and oriented to person, place, and time.     ED Results / Procedures / Treatments   Labs (all labs ordered are listed, but only abnormal results are displayed) Labs Reviewed  COMPREHENSIVE METABOLIC PANEL - Abnormal; Notable for the following components:      Result Value   Glucose, Bld 137 (*)    All other components within normal limits  CBC - Abnormal; Notable for the following components:   WBC 11.4 (*)    All other components within normal limits  URINALYSIS, ROUTINE W REFLEX MICROSCOPIC - Abnormal; Notable for the following components:   Color, Urine STRAW (*)    Ketones, ur 5 (*)    All other components within normal limits  LIPASE, BLOOD  HCG, QUANTITATIVE, PREGNANCY    EKG None  Radiology CT ABDOMEN PELVIS W CONTRAST  Result Date: 01/17/2020 CLINICAL DATA:  Generalized abdominal pain for 2 days, nausea and vomiting EXAM: CT ABDOMEN AND PELVIS WITH CONTRAST TECHNIQUE: Multidetector CT imaging of the abdomen and pelvis was performed  using the standard protocol following bolus administration of intravenous contrast. CONTRAST:  OMNIPAQUE IOHEXOL 300 MG/ML  SOLN COMPARISON:  10/17/2012 FINDINGS: Lower chest: No acute pleural or parenchymal lung disease. Hepatobiliary: There is diffuse hepatic steatosis. No focal abnormality. The gallbladder is unremarkable. Pancreas: Unremarkable. No pancreatic ductal dilatation or surrounding inflammatory changes. Spleen: Normal in size without focal abnormality. Adrenals/Urinary Tract: Adrenal glands are unremarkable. Kidneys are normal, without renal calculi, focal lesion, or hydronephrosis. Bladder is unremarkable. Stomach/Bowel: No bowel obstruction or ileus. Normal appendix right lower quadrant. No bowel wall thickening or inflammatory change. Vascular/Lymphatic: Aortic atherosclerosis. No enlarged abdominal or pelvic lymph nodes. Reproductive: Uterus and bilateral adnexa are unremarkable. Benign left adnexal cyst does not warrant follow-up. Other: No abdominal wall hernia or abnormality. No abdominopelvic ascites. Musculoskeletal: No acute or destructive bony lesions. Reconstructed images demonstrate no additional findings. IMPRESSION: 1. No acute intra-abdominal or intrapelvic process. 2. Diffuse hepatic steatosis. 3. Aortic Atherosclerosis (ICD10-I70.0). Electronically Signed   By: Sharlet Salina M.D.   On: 01/17/2020 19:15    Procedures Procedures (including critical care time)  Medications Ordered in ED Medications  morphine 4 MG/ML injection 4 mg (4 mg Intravenous Given 01/17/20 1745)  ondansetron (ZOFRAN) injection 4 mg (4 mg Intravenous Given 01/17/20 1744)  sodium chloride 0.9 % bolus 1,000 mL (0 mLs Intravenous Stopped 01/17/20 1946)  iohexol (OMNIPAQUE) 300 MG/ML solution 100 mL (100 mLs Intravenous Contrast Given 01/17/20 1850)  alum & mag hydroxide-simeth (MAALOX/MYLANTA) 200-200-20 MG/5ML suspension 30 mL (30 mLs Oral Given 01/17/20 2009)    And  lidocaine (XYLOCAINE) 2 % viscous  mouth solution 15 mL (15 mLs Oral Given 01/17/20 2009)    ED Course  I have reviewed the triage vital signs and the nursing notes.  Pertinent labs & imaging results that were available during my care of the patient were reviewed by me and considered in my medical decision making (  see chart for details).    MDM Rules/Calculators/A&P                          44yo female with history above presents with concern for abdominal pain worsening over the last month and nausea vomiting today.  No sign of UTI, pancreatitis, hepatitis by labs. Doubt pelvic etiology given location of pain.  Pregnancy test negative. CT done to evaluate for SBO shows no acute abnormalities.  Do not see emergent pathology. Symptoms may be secondary to gastritis. Given rx for pantoprazole and phenergan and recommend PCP follow up and consider GI follow up if symptoms conginue.    Final Clinical Impression(s) / ED Diagnoses Final diagnoses:  Epigastric pain  Acute gastritis without hemorrhage, unspecified gastritis type  Non-intractable vomiting with nausea, unspecified vomiting type    Rx / DC Orders ED Discharge Orders         Ordered    pantoprazole (PROTONIX) 20 MG tablet  Daily,   Status:  Discontinued     Reprint     01/17/20 1931    promethazine (PHENERGAN) 25 MG tablet  Every 6 hours PRN,   Status:  Discontinued     Reprint     01/17/20 1931    pantoprazole (PROTONIX) 20 MG tablet  Daily     Discontinue  Reprint     01/17/20 1952    promethazine (PHENERGAN) 25 MG tablet  Every 6 hours PRN     Discontinue  Reprint     01/17/20 1952           Alvira Monday, MD 01/18/20 [phone]

## 2020-01-17 NOTE — ED Triage Notes (Signed)
Patient arrived via [facility].   C/o generalized abdominal X2 days.  C/O n/v  Denies urinary problems   A/Ox4 Ambulatory in triage

## 2020-01-17 NOTE — ED Notes (Signed)
Pt to CT

## 2020-01-20 ENCOUNTER — Encounter (HOSPITAL_COMMUNITY): Payer: Self-pay | Admitting: Emergency Medicine

## 2020-01-20 ENCOUNTER — Emergency Department (HOSPITAL_COMMUNITY): Payer: Self-pay

## 2020-01-20 ENCOUNTER — Other Ambulatory Visit: Payer: Self-pay

## 2020-01-20 ENCOUNTER — Emergency Department (HOSPITAL_COMMUNITY)
Admission: EM | Admit: 2020-01-20 | Discharge: 2020-01-20 | Disposition: A | Discharge status: Home / Self Care | Payer: Self-pay | Attending: Emergency Medicine | Admitting: Emergency Medicine

## 2020-01-20 DIAGNOSIS — M545 Low back pain, unspecified: Secondary | ICD-10-CM

## 2020-01-20 DIAGNOSIS — M79604 Pain in right leg: Secondary | ICD-10-CM | POA: Insufficient documentation

## 2020-01-20 DIAGNOSIS — F17210 Nicotine dependence, cigarettes, uncomplicated: Secondary | ICD-10-CM | POA: Insufficient documentation

## 2020-01-20 DIAGNOSIS — G8929 Other chronic pain: Secondary | ICD-10-CM

## 2020-01-20 DIAGNOSIS — F1210 Cannabis abuse, uncomplicated: Secondary | ICD-10-CM | POA: Insufficient documentation

## 2020-01-20 DIAGNOSIS — J45909 Unspecified asthma, uncomplicated: Secondary | ICD-10-CM | POA: Insufficient documentation

## 2020-01-20 DIAGNOSIS — M25551 Pain in right hip: Secondary | ICD-10-CM | POA: Insufficient documentation

## 2020-01-20 MED ORDER — CYCLOBENZAPRINE HCL 10 MG PO TABS
10.0000 mg | ORAL_TABLET | Freq: Two times a day (BID) | ORAL | 0 refills | Status: DC | PRN
Start: 2020-01-20 — End: 2020-09-06

## 2020-01-20 MED ORDER — HYDROCODONE-ACETAMINOPHEN 5-325 MG PO TABS
1.0000 | ORAL_TABLET | Freq: Once | ORAL | Status: AC
  Administered 2020-01-20: 1 via ORAL
  Filled 2020-01-20: qty 1

## 2020-01-20 MED ORDER — PREDNISONE 10 MG PO TABS
20.0000 mg | ORAL_TABLET | Freq: Two times a day (BID) | ORAL | 0 refills | Status: DC
Start: 2020-01-20 — End: 2020-01-20

## 2020-01-20 MED ORDER — PREDNISONE 10 MG PO TABS
20.0000 mg | ORAL_TABLET | Freq: Two times a day (BID) | ORAL | 0 refills | Status: AC
Start: 2020-01-20 — End: 2020-01-24

## 2020-01-20 MED ORDER — KETOROLAC TROMETHAMINE 15 MG/ML IJ SOLN
15.0000 mg | Freq: Once | INTRAMUSCULAR | Status: AC
  Administered 2020-01-20: 15 mg via INTRAMUSCULAR
  Filled 2020-01-20: qty 1

## 2020-01-20 MED ORDER — CYCLOBENZAPRINE HCL 10 MG PO TABS
10.0000 mg | ORAL_TABLET | Freq: Two times a day (BID) | ORAL | 0 refills | Status: DC | PRN
Start: 2020-01-20 — End: 2020-01-20

## 2020-01-20 MED ORDER — NAPROXEN 500 MG PO TABS
500.0000 mg | ORAL_TABLET | Freq: Two times a day (BID) | ORAL | 0 refills | Status: AC
Start: 2020-01-20 — End: 2020-01-27

## 2020-01-20 MED ORDER — LIDOCAINE 5 % EX PTCH
1.0000 | MEDICATED_PATCH | Freq: Once | CUTANEOUS | Status: DC
  Administered 2020-01-20: 1 via TRANSDERMAL
  Filled 2020-01-20: qty 1

## 2020-01-20 MED ORDER — PREDNISONE 20 MG PO TABS
60.0000 mg | ORAL_TABLET | Freq: Once | ORAL | Status: AC
  Administered 2020-01-20: 60 mg via ORAL
  Filled 2020-01-20: qty 3

## 2020-01-20 NOTE — ED Triage Notes (Signed)
Pt reports back pains and right hip pain that been going on all year but has gotten worse. Denies any new falls or injuries.

## 2020-01-20 NOTE — ED Provider Notes (Signed)
[facility]-EMERGENCY DEPT Provider Note   CSN: [id number] Arrival date & time: 01/20/20  1026     History Chief Complaint  Patient presents with  . Back Pain  . Hip Pain    right    Susan [name] is a 43 y.o. female with past medical history significant for alcohol abuse, polysubstance abuse, GERD  HPI Patient is presenting today to emergency department with chief complaint of back and right hip pain x1 year.  She states the pain has progressively worsened over the last 3 days.  The pain is located on her right lower back and radiates to her right hip and down her leg.  She states the pain is constant.  Pain is worse with ambulation.  She describes the pain as sharp and shooting.  She rates the pain 9 out of 10 in severity.  She tried taking Tylenol and ibuprofen yesterday without any symptom improvement.  She states she had a fall at a job x20 years ago and ever since then has had intermittent back pain.  She denies any recent fall, injury or trauma.  She has had no recent imaging of her back. Denies fevers, weight loss, numbness/weakness of upper and lower extremities, bowel/bladder incontinence, urinary retention, history of cancer, saddle anesthesia, history of back surgery, history of IVDA.  Patient does admit she has been alcohol and substance use free x6 months.     Past Medical History:  Diagnosis Date  . Alcohol abuse   . Asthma   . BV (bacterial vaginosis) 03/06/2013  . Chronic dental pain   . Chronic pelvic pain in female   . Depression   . Ectopic pregnancy   . GERD (gastroesophageal reflux disease)   . Panic attacks   . Polysubstance abuse (HCC)    cocaine, opiates, marijuana  . Trichomonas vaginitis     Patient Active Problem List   Diagnosis Date Noted  . Polysubstance dependence (HCC) 07/27/2019  . MDD (major depressive disorder), recurrent episode, severe (HCC) 01/23/2019  . PTSD (post-traumatic stress disorder) 06/07/2017  . Substance  induced mood disorder (HCC) 06/04/2017  . Preterm labor 04/10/2017  . Asymptomatic bacteriuria during pregnancy in second trimester 03/14/2017  . Late prenatal care in second trimester 03/08/2017  . Supervision of normal pregnancy 03/08/2017  . MDD (major depressive disorder), single episode, severe with psychotic features (HCC) 01/05/2017  . Alcohol use disorder, severe, dependence (HCC) 01/05/2017  . Cocaine use disorder, moderate, dependence (HCC) 01/05/2017  . Cannabis use disorder, moderate, dependence (HCC) 01/05/2017  . Heavy smoker 02/03/2016  . Cocaine abuse affecting pregnancy, antepartum (HCC) 02/03/2016  . Chronic, continuous use of opioids 03/24/2015  . Depression with anxiety 03/24/2015  . Poor dentition 03/24/2015  . Sprain of ankle, unspecified site 05/28/2009    Past Surgical History:  Procedure Laterality Date  . ECTOPIC PREGNANCY SURGERY       OB History    Gravida  9   Para  5   Term  4   Preterm  1   AB  4   Living  5     SAB  1   TAB  2   Ectopic  1   Multiple  0   Live Births  5           Family History  Problem Relation Age of Onset  . COPD Mother   . Stroke Mother   . Heart disease Maternal Grandmother   . Hypertension Maternal Grandmother   .  Stroke Maternal Grandfather   . Asthma Sister   . Down syndrome Sister     Social History   Tobacco Use  . Smoking status: Current Every Day Smoker    Packs/day: 1.00    Years: 26.00    Pack years: 26.00    Types: Cigarettes  . Smokeless tobacco: Never Used  Vaping Use  . Vaping Use: Never used  Substance Use Topics  . Alcohol use: Yes    Alcohol/week: 3.0 - 4.0 standard drinks    Types: 3 - 4 Cans of beer per week    Comment: drinks about a 40oz about once a week.   . Drug use: Yes    Types: Marijuana, Cocaine    Home Medications Prior to Admission medications   Medication Sig Start Date End Date Taking? Authorizing Provider  acyclovir ointment (ZOVIRAX) 5 % Apply  topically 4 (four) times daily. To lip 07/30/19   Aldean Baker, NP  cyclobenzaprine (FLEXERIL) 10 MG tablet Take 1 tablet (10 mg total) by mouth 2 (two) times daily as needed for muscle spasms. 01/20/20   Sanjana Folz E, PA-C  FLUoxetine (PROZAC) 10 MG capsule Take 3 capsules (30 mg total) by mouth daily. 07/31/19   Aldean Baker, NP  gabapentin (NEURONTIN) 400 MG capsule Take 1 capsule (400 mg total) by mouth 3 (three) times daily. 07/30/19   Aldean Baker, NP  hydrocerin (EUCERIN) CREA Apply 1 application topically 3 (three) times daily as needed (eczema). 07/30/19   Aldean Baker, NP  hydrOXYzine (ATARAX/VISTARIL) 25 MG tablet Take 1 tablet (25 mg total) by mouth every 6 (six) hours as needed for anxiety. 07/30/19   Aldean Baker, NP  metroNIDAZOLE (FLAGYL) 500 MG tablet Take 1 tablet (500 mg total) by mouth 2 (two) times daily. 07/30/19   Aldean Baker, NP  naproxen (NAPROSYN) 500 MG tablet Take 1 tablet (500 mg total) by mouth 2 (two) times daily with a meal for 7 days. 01/20/20 01/27/20  Sapna Padron E, PA-C  neomycin-bacitracin-polymyxin (NEOSPORIN) OINT Apply 1 application topically as needed for irritation. 07/30/19   Aldean Baker, NP  nicotine (NICODERM CQ - DOSED IN MG/24 HOURS) 21 mg/24hr patch Place 1 patch (21 mg total) onto the skin daily. 07/31/19   Aldean Baker, NP  pantoprazole (PROTONIX) 20 MG tablet Take 2 tablets (40 mg total) by mouth daily for 14 days. 01/17/20 01/31/20  Alvira Monday, MD  predniSONE (DELTASONE) 10 MG tablet Take 2 tablets (20 mg total) by mouth 2 (two) times daily with a meal for 4 days. 01/20/20 01/24/20  Mafalda Mcginniss, Caroleen Hamman, PA-C  promethazine (PHENERGAN) 25 MG tablet Take 1 tablet (25 mg total) by mouth every 6 (six) hours as needed for nausea or vomiting. 01/17/20   Alvira Monday, MD  risperiDONE (RISPERDAL) 2 MG tablet Take 1 tablet (2 mg total) by mouth at bedtime. 07/30/19   Aldean Baker, NP  traZODone (DESYREL) 100 MG tablet Take 2  tablets (200 mg total) by mouth at bedtime. 07/30/19   Aldean Baker, NP    Allergies    Patient has no known allergies.  Review of Systems   Review of Systems All other systems are reviewed and are negative for acute change except as noted in the HPI.  Physical Exam Updated Vital Signs BP (!) 148/99 (BP Location: Left Arm)   Pulse 69   Temp 97.8 F (36.6 C) (Oral)   Resp 18   LMP 05/24/2019 (Exact  Date)   SpO2 98%   Physical Exam Vitals and nursing note reviewed.  Constitutional:      Appearance: She is well-developed. She is not ill-appearing or toxic-appearing.  HENT:     Head: Normocephalic and atraumatic.     Nose: Nose normal.  Eyes:     General: No scleral icterus.       Right eye: No discharge.        Left eye: No discharge.     Conjunctiva/sclera: Conjunctivae normal.  Neck:     Vascular: No JVD.  Cardiovascular:     Rate and Rhythm: Normal rate and regular rhythm.     Pulses: Normal pulses.          Dorsalis pedis pulses are 2+ on the right side and 2+ on the left side.     Heart sounds: Normal heart sounds.  Pulmonary:     Effort: Pulmonary effort is normal.     Breath sounds: Normal breath sounds.  Abdominal:     General: There is no distension.  Musculoskeletal:        General: Normal range of motion.     Cervical back: Normal range of motion.       Back:     Right lower leg: No edema.     Left lower leg: No edema.     Comments: Tenderness to palpation as depicted in image above.  No overlying skin changes.  Positive straight leg raise test on the right.  Full range of motion of the T-spine and L-spine No tenderness to palpation of the spinous processes of the T-spine or L-spine No crepitus, deformity or step-offs No tenderness to palpation of the paraspinous muscles of the L-spine      Skin:    General: Skin is warm and dry.     Capillary Refill: Capillary refill takes less than 2 seconds.     Comments: No track marks seen on extremities    Neurological:     Mental Status: She is oriented to person, place, and time.     GCS: GCS eye subscore is 4. GCS verbal subscore is 5. GCS motor subscore is 6.     Comments: Fluent speech, no facial droop.  Speech is clear and goal oriented, follows commands CN III-XII intact, no facial droop Normal strength in upper and lower extremities bilaterally including dorsiflexion and plantar flexion, strong and equal grip strength Sensation normal to light and sharp touch Moves extremities without ataxia, coordination intact Normal finger to nose and rapid alternating movements Normal gait and balance   Psychiatric:        Behavior: Behavior normal.     ED Results / Procedures / Treatments   Labs (all labs ordered are listed, but only abnormal results are displayed) Labs Reviewed - No data to display  EKG None  Radiology DG Lumbar Spine Complete  Result Date: 01/20/2020 CLINICAL DATA:  Chronic pain with right-sided radicular symptoms EXAM: LUMBAR SPINE - COMPLETE 4+ VIEW COMPARISON:  February 17, 2014 FINDINGS: Frontal, lateral, spot lumbosacral lateral, and bilateral oblique views were obtained. There are 5 non-rib-bearing lumbar type vertebral bodies. The T12 ribs bilaterally are virtually aplastic. No fracture or spondylolisthesis. Disc spaces appear normal. Small anterior osteophyte along the anterior superior aspect of L4. There is no appreciable facet arthropathy. IMPRESSION: No fracture or spondylolisthesis. No appreciable arthropathic change. Electronically Signed   By: Bretta BangWilliam  Woodruff III M.D.   On: 01/20/2020 12:41   DG Hip Unilat W or  Wo Pelvis 2-3 Views Right  Result Date: 01/20/2020 CLINICAL DATA:  Chronic pain EXAM: DG HIP (WITH OR WITHOUT PELVIS) 2-3V RIGHT COMPARISON:  None. FINDINGS: Frontal pelvis as well as frontal and lateral left hip images obtained. No fracture or dislocation. There is slight symmetric narrowing of each hip joint. No erosive change. There is mild bony  overgrowth along the superolateral aspect of each acetabulum. Sacroiliac joints appear symmetric and normal bilaterally. IMPRESSION: Slight symmetric narrowing of each hip joint. Mild bony overgrowth along each superolateral acetabulum potentially places patient at increased risk for femoroacetabular impingement. No fracture or dislocation. Electronically Signed   By: Bretta Bang III M.D.   On: 01/20/2020 12:42    Procedures Procedures (including critical care time)  Medications Ordered in ED Medications  ketorolac (TORADOL) 15 MG/ML injection 15 mg (15 mg Intramuscular Given 01/20/20 1154)  predniSONE (DELTASONE) tablet 60 mg (60 mg Oral Given 01/20/20 1154)  HYDROcodone-acetaminophen (NORCO/VICODIN) 5-325 MG per tablet 1 tablet (1 tablet Oral Given 01/20/20 1302)    ED Course  I have reviewed the triage vital signs and the nursing notes.  Pertinent labs & imaging results that were available during my care of the patient were reviewed by me and considered in my medical decision making (see chart for details).    MDM Rules/Calculators/A&P                         History provided by patient with additional history obtained from chart review.    Patient with back pain.  She is insistent on x-rays today.  X-ray of lumbar spine shows no fractures or spondylolisthesis. Right hip xray shows no fracture or dislocation. Exam today without neurological deficits and normal neuro exam.  Patient can walk but states is painful.  No loss of bowel or bladder control.  No concern for cauda equina.  No fever, night sweats, weight loss, h/o cancer, IVDU.  Will discharge home with muscle relaxer and short steroid burst as well as Naproxen. Recommend pcp follow up if symptoms persist.  The patient appears reasonably screened and/or stabilized for discharge and I doubt any other medical condition or other [facility] requiring further screening, evaluation, or treatment in the ED at this time prior to discharge. The  patient is safe for discharge with strict return precautions discussed.    Portions of this note were generated with Scientist, clinical (histocompatibility and immunogenetics). Dictation errors may occur despite best attempts at proofreading.   Final Clinical Impression(s) / ED Diagnoses Final diagnoses:  Chronic right-sided low back pain, unspecified whether sciatica present    Rx / DC Orders ED Discharge Orders         Ordered    cyclobenzaprine (FLEXERIL) 10 MG tablet  2 times daily PRN,   Status:  Discontinued     Reprint     01/20/20 1250    predniSONE (DELTASONE) 10 MG tablet  2 times daily with meals,   Status:  Discontinued     Reprint     01/20/20 1250    naproxen (NAPROSYN) 500 MG tablet  2 times daily with meals     Discontinue  Reprint     01/20/20 1257    cyclobenzaprine (FLEXERIL) 10 MG tablet  2 times daily PRN     Discontinue  Reprint     01/20/20 1257    predniSONE (DELTASONE) 10 MG tablet  2 times daily with meals     Discontinue  Reprint  01/20/20 [place], Harley Hallmark, PA-C 01/20/20 2112    Varney Biles, MD 01/22/20 [phone]

## 2020-01-20 NOTE — Discharge Instructions (Addendum)
Your back pain should be treated with medicines such as ibuprofen or tylenol and this back pain should get better over the next 2 weeks.   Follow Up: Please follow up with your primary healthcare provider in 1-2 weeks for reassessment. if you do not have a primary care doctor use the resource guide provided to find one.  Low back pain is discomfort in the lower back that may be due to injuries to muscles and ligaments around the spine. Occasionally, it may be caused by a a problem to a part of the spine called a disc. The pain may last several days or a week;  However, most patients get completely well in 4 weeks.   1. Medications: Alternate 600 mg of ibuprofen and [phone] mg of Tylenol every 3 hours as needed for pain. Do not exceed 4000 mg of Tylenol daily.  Take ibuprofen with food to avoid upset stomach issues.   Muscle relaxants:  These medications can help with muscle tightness that is a cause of lower back pain. Most of these medications can cause drowsiness, and it is not safe to drive or use dangerous machinery while taking them.You can take Flexeril as needed for muscle spasm up to twice daily but do not drive, drink alcohol, or operate heavy machinery while taking this medicine because it may make you drowsy.  I typically recommend taking this medicine only at night when you are going to sleep.  You can also cut these tablets in half if they make you feel very drowsy.  -Also sent prescription for prednisone this helps with pain and inflammation as well.  2. Treatment: rest, drink plenty of fluids, gentle stretching as discussed (see attached), alternate ice and heat (or stick with whichever feels best) 20 minutes on 20 minutes off. Maintaining your daily activities, including walking, is encourged, as it will help you get better faster than just staying in bed.    Be aware that if you develop new symptoms, such as a fever, leg weakness, difficulty with or loss of control of your urine or  bowels, abdominal pain, or more severe pain, you will need to seek medical attention immediately and  / or return to the Emergency department.

## 2020-03-11 ENCOUNTER — Other Ambulatory Visit: Payer: Self-pay

## 2020-03-11 ENCOUNTER — Emergency Department (HOSPITAL_COMMUNITY): Payer: Self-pay

## 2020-03-11 ENCOUNTER — Encounter (HOSPITAL_COMMUNITY): Payer: Self-pay

## 2020-03-11 DIAGNOSIS — J45909 Unspecified asthma, uncomplicated: Secondary | ICD-10-CM | POA: Insufficient documentation

## 2020-03-11 DIAGNOSIS — M79672 Pain in left foot: Secondary | ICD-10-CM | POA: Insufficient documentation

## 2020-03-11 DIAGNOSIS — T1490XA Injury, unspecified, initial encounter: Secondary | ICD-10-CM

## 2020-03-11 DIAGNOSIS — F17210 Nicotine dependence, cigarettes, uncomplicated: Secondary | ICD-10-CM | POA: Insufficient documentation

## 2020-03-11 NOTE — ED Triage Notes (Signed)
Patient arrived stating over the last week the side of her left foot has been throbbing. No known injury. States she has been taking naproxen and gabapentin with no relief, last dose this morning.

## 2020-03-12 ENCOUNTER — Emergency Department (HOSPITAL_COMMUNITY)
Admission: EM | Admit: 2020-03-12 | Discharge: 2020-03-12 | Disposition: A | Discharge status: Home / Self Care | Payer: Self-pay | Attending: Emergency Medicine | Admitting: Emergency Medicine

## 2020-03-12 DIAGNOSIS — T1490XA Injury, unspecified, initial encounter: Secondary | ICD-10-CM

## 2020-03-12 DIAGNOSIS — M79672 Pain in left foot: Secondary | ICD-10-CM

## 2020-03-12 NOTE — ED Provider Notes (Signed)
[facility]-EMERGENCY DEPT Provider Note   CSN: [id number] Arrival date & time: 03/11/20  6294     History Chief Complaint  Patient presents with  . Foot Pain    Susan [name] is a 44 y.o. female.  44 yo F with a chief complaints of left foot pain.  Going on for about a week.  Nontraumatic.  No erythema.  Worse with bearing weight.  She tells me its worst all day.  No specific time is worse than the other.  Got so bad at work today that she felt she had to come to the ED for evaluation.  No fevers.  The history is provided by the patient.  Foot Pain This is a new problem. The current episode started more than 1 week ago. The problem occurs constantly. The problem has not changed since onset.Pertinent negatives include no chest pain, no headaches and no shortness of breath. The symptoms are aggravated by bending and walking. Nothing relieves the symptoms. She has tried nothing for the symptoms. The treatment provided no relief.       Past Medical History:  Diagnosis Date  . Alcohol abuse   . Asthma   . BV (bacterial vaginosis) 03/06/2013  . Chronic dental pain   . Chronic pelvic pain in female   . Depression   . Ectopic pregnancy   . GERD (gastroesophageal reflux disease)   . Panic attacks   . Polysubstance abuse (HCC)    cocaine, opiates, marijuana  . Trichomonas vaginitis     Patient Active Problem List   Diagnosis Date Noted  . Polysubstance dependence (HCC) 07/27/2019  . MDD (major depressive disorder), recurrent episode, severe (HCC) 01/23/2019  . PTSD (post-traumatic stress disorder) 06/07/2017  . Substance induced mood disorder (HCC) 06/04/2017  . Preterm labor 04/10/2017  . Asymptomatic bacteriuria during pregnancy in second trimester 03/14/2017  . Late prenatal care in second trimester 03/08/2017  . Supervision of normal pregnancy 03/08/2017  . MDD (major depressive disorder), single episode, severe with psychotic features (HCC) 01/05/2017    . Alcohol use disorder, severe, dependence (HCC) 01/05/2017  . Cocaine use disorder, moderate, dependence (HCC) 01/05/2017  . Cannabis use disorder, moderate, dependence (HCC) 01/05/2017  . Heavy smoker 02/03/2016  . Cocaine abuse affecting pregnancy, antepartum (HCC) 02/03/2016  . Chronic, continuous use of opioids 03/24/2015  . Depression with anxiety 03/24/2015  . Poor dentition 03/24/2015  . Sprain of ankle, unspecified site 05/28/2009    Past Surgical History:  Procedure Laterality Date  . ECTOPIC PREGNANCY SURGERY       OB History    Gravida  9   Para  5   Term  4   Preterm  1   AB  4   Living  5     SAB  1   TAB  2   Ectopic  1   Multiple  0   Live Births  5           Family History  Problem Relation Age of Onset  . COPD Mother   . Stroke Mother   . Heart disease Maternal Grandmother   . Hypertension Maternal Grandmother   . Stroke Maternal Grandfather   . Asthma Sister   . Down syndrome Sister     Social History   Tobacco Use  . Smoking status: Current Every Day Smoker    Packs/day: 1.00    Years: 26.00    Pack years: 26.00    Types: Cigarettes  .  Smokeless tobacco: Never Used  Vaping Use  . Vaping Use: Never used  Substance Use Topics  . Alcohol use: Yes    Alcohol/week: 3.0 - 4.0 standard drinks    Types: 3 - 4 Cans of beer per week    Comment: drinks about a 40oz about once a week.   . Drug use: Yes    Types: Marijuana, Cocaine    Home Medications Prior to Admission medications   Medication Sig Start Date End Date Taking? Authorizing Provider  acyclovir ointment (ZOVIRAX) 5 % Apply topically 4 (four) times daily. To lip 07/30/19   Aldean Baker, NP  cyclobenzaprine (FLEXERIL) 10 MG tablet Take 1 tablet (10 mg total) by mouth 2 (two) times daily as needed for muscle spasms. 01/20/20   Albrizze, Kaitlyn E, PA-C  FLUoxetine (PROZAC) 10 MG capsule Take 3 capsules (30 mg total) by mouth daily. 07/31/19   Aldean Baker, NP   gabapentin (NEURONTIN) 400 MG capsule Take 1 capsule (400 mg total) by mouth 3 (three) times daily. 07/30/19   Aldean Baker, NP  hydrocerin (EUCERIN) CREA Apply 1 application topically 3 (three) times daily as needed (eczema). 07/30/19   Aldean Baker, NP  hydrOXYzine (ATARAX/VISTARIL) 25 MG tablet Take 1 tablet (25 mg total) by mouth every 6 (six) hours as needed for anxiety. 07/30/19   Aldean Baker, NP  metroNIDAZOLE (FLAGYL) 500 MG tablet Take 1 tablet (500 mg total) by mouth 2 (two) times daily. 07/30/19   Aldean Baker, NP  neomycin-bacitracin-polymyxin (NEOSPORIN) OINT Apply 1 application topically as needed for irritation. 07/30/19   Aldean Baker, NP  nicotine (NICODERM CQ - DOSED IN MG/24 HOURS) 21 mg/24hr patch Place 1 patch (21 mg total) onto the skin daily. 07/31/19   Aldean Baker, NP  pantoprazole (PROTONIX) 20 MG tablet Take 2 tablets (40 mg total) by mouth daily for 14 days. 01/17/20 01/31/20  Alvira Monday, MD  promethazine (PHENERGAN) 25 MG tablet Take 1 tablet (25 mg total) by mouth every 6 (six) hours as needed for nausea or vomiting. 01/17/20   Alvira Monday, MD  risperiDONE (RISPERDAL) 2 MG tablet Take 1 tablet (2 mg total) by mouth at bedtime. 07/30/19   Aldean Baker, NP  traZODone (DESYREL) 100 MG tablet Take 2 tablets (200 mg total) by mouth at bedtime. 07/30/19   Aldean Baker, NP    Allergies    Patient has no known allergies.  Review of Systems   Review of Systems  Constitutional: Negative for chills and fever.  HENT: Negative for congestion and rhinorrhea.   Eyes: Negative for redness and visual disturbance.  Respiratory: Negative for shortness of breath and wheezing.   Cardiovascular: Negative for chest pain and palpitations.  Gastrointestinal: Negative for nausea and vomiting.  Genitourinary: Negative for dysuria and urgency.  Musculoskeletal: Positive for arthralgias. Negative for myalgias.  Skin: Negative for pallor and wound.  Neurological:  Negative for dizziness and headaches.    Physical Exam Updated Vital Signs BP (!) 125/96   Pulse 89   Temp 98.5 F (36.9 C) (Oral)   Resp 18   Ht 5\' 1"  (1.549 m)   Wt 77.1 kg   LMP 02/07/2020   SpO2 98%   BMI 32.12 kg/m   Physical Exam Vitals and nursing note reviewed.  Constitutional:      General: She is not in acute distress.    Appearance: She is well-developed. She is not diaphoretic.  HENT:  Head: Normocephalic and atraumatic.  Eyes:     Pupils: Pupils are equal, round, and reactive to light.  Cardiovascular:     Rate and Rhythm: Normal rate and regular rhythm.     Heart sounds: No murmur heard.  No friction rub. No gallop.   Pulmonary:     Effort: Pulmonary effort is normal.     Breath sounds: No wheezing or rales.  Abdominal:     General: There is no distension.     Palpations: Abdomen is soft.     Tenderness: There is no abdominal tenderness.  Musculoskeletal:        General: Tenderness present. No swelling or deformity.     Cervical back: Normal range of motion and neck supple.     Comments: No signs of trauma to the foot.  No bruising no swelling.  Pulse motor and sensation are intact.  She points to the dorsum of the foot as the area of most pain though she reportedly has pain to the plantar aspect as well.  No pain to the navicular or the base of the fifth metatarsal.  No pain along the malleolus of the ankle.  No fibular tenderness.  Skin:    General: Skin is warm and dry.  Neurological:     Mental Status: She is alert and oriented to person, place, and time.  Psychiatric:        Behavior: Behavior normal.     ED Results / Procedures / Treatments   Labs (all labs ordered are listed, but only abnormal results are displayed) Labs Reviewed - No data to display  EKG None  Radiology DG Foot Complete Left  Result Date: 03/11/2020 CLINICAL DATA:  Throbbing left foot EXAM: LEFT FOOT - COMPLETE 3+ VIEW COMPARISON:  None. FINDINGS: No fracture or  malalignment. Moderate plantar calcaneal spur. Joint spaces are maintained IMPRESSION: No acute osseous abnormality. Electronically Signed   By: Jasmine Pang M.D.   On: 03/11/2020 20:34    Procedures Procedures (including critical care time)  Medications Ordered in ED Medications - No data to display  ED Course  I have reviewed the triage vital signs and the nursing notes.  Pertinent labs & imaging results that were available during my care of the patient were reviewed by me and considered in my medical decision making (see chart for details).    MDM Rules/Calculators/A&P                          44 yo F with a chief complaints of left foot pain.  Going on for about a week.  Nontraumatic.  Most likely cause of the patient's symptoms be plantar fasciitis though I am unsure why she states that the dorsum of the foot hurts the worst.  Could be due to the patient's foot wear.  Suggested that she change it.  We will have her be nonweightbearing and follow-up with a podiatrist.  1:34 AM:  I have discussed the diagnosis/risks/treatment options with the patient and believe the pt to be eligible for discharge home to follow-up with PCP. We also discussed returning to the ED immediately if new or worsening sx occur. We discussed the sx which are most concerning (e.g., sudden worsening pain, fever, inability to tolerate by mouth) that necessitate immediate return. Medications administered to the patient during their visit and any new prescriptions provided to the patient are listed below.  Medications given during this visit Medications - No data to  display   The patient appears reasonably screen and/or stabilized for discharge and I doubt any other medical condition or other [facility] requiring further screening, evaluation, or treatment in the ED at this time prior to discharge.   Final Clinical Impression(s) / ED Diagnoses Final diagnoses:  Injury  Left foot pain    Rx / DC Orders ED Discharge  Orders    None       Melene PlanFloyd, Jamiria Langill, DO 03/12/20 0134

## 2020-03-12 NOTE — Discharge Instructions (Signed)
Take 4 over the counter ibuprofen tablets 3 times a day or 2 over-the-counter naproxen tablets twice a day for pain. Also take tylenol 1000mg (2 extra strength) four times a day.    The most common cause of foot pain is plantar fasciitis.  Please wear very supportive footwear at all times.  Follow-up with the podiatrist in the office.  Return for redness or fever.

## 2020-03-13 ENCOUNTER — Encounter (HOSPITAL_COMMUNITY): Payer: Self-pay | Admitting: Psychiatry

## 2020-03-13 ENCOUNTER — Ambulatory Visit (INDEPENDENT_AMBULATORY_CARE_PROVIDER_SITE_OTHER): Payer: No Payment, Other | Admitting: Psychiatry

## 2020-03-13 ENCOUNTER — Other Ambulatory Visit: Payer: Self-pay

## 2020-03-13 DIAGNOSIS — F411 Generalized anxiety disorder: Secondary | ICD-10-CM | POA: Diagnosis not present

## 2020-03-13 DIAGNOSIS — F1994 Other psychoactive substance use, unspecified with psychoactive substance-induced mood disorder: Secondary | ICD-10-CM

## 2020-03-13 MED ORDER — GABAPENTIN 300 MG PO CAPS: 300.0000 mg | ORAL_CAPSULE | Freq: Three times a day (TID) | ORAL | 2 refills | Status: DC

## 2020-03-13 MED ORDER — BUSPIRONE HCL 10 MG PO TABS: 10.0000 mg | ORAL_TABLET | Freq: Three times a day (TID) | ORAL | 2 refills | Status: DC

## 2020-03-13 MED ORDER — LATUDA 60 MG PO TABS: 60.0000 mg | ORAL_TABLET | Freq: Every day | ORAL | 2 refills | Status: DC

## 2020-03-13 MED ORDER — OXCARBAZEPINE 300 MG PO TABS: 300.0000 mg | ORAL_TABLET | Freq: Three times a day (TID) | ORAL | 2 refills | Status: DC

## 2020-03-13 MED ORDER — HYDROXYZINE HCL 50 MG PO TABS: 50.0000 mg | ORAL_TABLET | Freq: Three times a day (TID) | ORAL | 2 refills | Status: DC
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
## 2020-03-20 ENCOUNTER — Telehealth (HOSPITAL_COMMUNITY): Payer: Self-pay | Admitting: *Deleted

## 2020-03-20 NOTE — Telephone Encounter (Signed)
Call from patient stating she uses mail order pharmacy [facility] and when she got her medicine this week there was no Latuda but she got Trazadone which she says she doesn't take. Orders indicate we went RX to [facility] on Latuda and we didn't order Trazadone. Writer called [facility] and a Dr other than one from this agency ordered Trazadone and they dont carry [place]. Kasandra Knudsen is a PAP med and can refer her to [facility] for assist with that. Tried to call her back but no answer and no VM. Will try later.

## 2020-03-27 ENCOUNTER — Ambulatory Visit: Payer: Medicaid Other | Admitting: Sports Medicine

## 2020-04-07 ENCOUNTER — Telehealth (HOSPITAL_COMMUNITY): Payer: Self-pay | Admitting: *Deleted

## 2020-04-07 ENCOUNTER — Other Ambulatory Visit (HOSPITAL_COMMUNITY): Payer: Self-pay | Admitting: Psychiatry

## 2020-04-07 DIAGNOSIS — F1994 Other psychoactive substance use, unspecified with psychoactive substance-induced mood disorder: Secondary | ICD-10-CM

## 2020-04-07 MED ORDER — LATUDA 60 MG PO TABS: 60.0000 mg | ORAL_TABLET | Freq: Every day | ORAL | 2 refills | Status: DC

## 2020-04-07 MED FILL — LATUDA 60 MG TABLET: 60 | 30 days supply | Qty: 30 | Fill #0

## 2020-04-07 NOTE — Telephone Encounter (Signed)
Called patient back re her Latuda. Informed her PAP program can be used thru [facility] and will ask Dr Doyne Keel to call her RX for Latuda to them and she can pick it up this week. Address and phone number provided to her.

## 2020-04-07 NOTE — Telephone Encounter (Signed)
Susan [name] left a VM on writers phone over the weekend still seeking Latuda, wanting a different med so it can be called in to [facility] where she can get her medicine for free for one year, and she cant get Latuda there they dont carry it. I had tried to call her back two weeks ago when I first learned of this issue but never got an answer, though left a message. Will reach out to her later today to follow up on her Latuda. She can use [facility] for PAP program.

## 2020-04-17 ENCOUNTER — Encounter (HOSPITAL_COMMUNITY): Payer: Self-pay | Admitting: Psychiatry

## 2020-04-17 ENCOUNTER — Ambulatory Visit (INDEPENDENT_AMBULATORY_CARE_PROVIDER_SITE_OTHER): Payer: No Payment, Other | Admitting: Psychiatry

## 2020-04-17 ENCOUNTER — Other Ambulatory Visit: Payer: Self-pay

## 2020-04-17 DIAGNOSIS — F411 Generalized anxiety disorder: Secondary | ICD-10-CM | POA: Diagnosis not present

## 2020-04-17 DIAGNOSIS — F1994 Other psychoactive substance use, unspecified with psychoactive substance-induced mood disorder: Secondary | ICD-10-CM | POA: Diagnosis not present

## 2020-04-17 MED ORDER — HYDROXYZINE HCL 50 MG PO TABS: 50.0000 mg | ORAL_TABLET | Freq: Three times a day (TID) | ORAL | 2 refills | Status: DC

## 2020-04-17 MED ORDER — LURASIDONE HCL 80 MG PO TABS: 80.0000 mg | ORAL_TABLET | Freq: Every day | ORAL | 2 refills | Status: DC

## 2020-04-17 MED ORDER — GABAPENTIN 300 MG PO CAPS: 300.0000 mg | ORAL_CAPSULE | Freq: Three times a day (TID) | ORAL | 2 refills | Status: DC

## 2020-04-17 MED ORDER — ESCITALOPRAM OXALATE 10 MG PO TABS: 10.0000 mg | ORAL_TABLET | Freq: Every day | ORAL | 2 refills | Status: DC

## 2020-04-17 MED ORDER — OXCARBAZEPINE 300 MG PO TABS: 300.0000 mg | ORAL_TABLET | Freq: Three times a day (TID) | ORAL | 2 refills | Status: DC

## 2020-04-17 MED ORDER — BUSPIRONE HCL 10 MG PO TABS: 10.0000 mg | ORAL_TABLET | Freq: Three times a day (TID) | ORAL | 2 refills | Status: DC

## 2020-04-17 MED FILL — OXcarbazepine 300 MG TABS: 300 | 30 days supply | Qty: 90 | Fill #0

## 2020-04-17 MED FILL — LATUDA 80 MG TABLET: 80 | 30 days supply | Qty: 30 | Fill #0

## 2020-04-17 MED FILL — GABAPENTIN 300 MG CAPSULE: 300 | 30 days supply | Qty: 90 | Fill #0

## 2020-04-17 NOTE — Progress Notes (Signed)
BH MD/PA/NP OP Progress Note  04/17/2020 9:57 AM Susan [name]  MRN:  [id number]  Chief Complaint: "I'm so irritable and depressed"  HPI:44 year old female seen today for followup psychiatric relation.  She has a psychiatric history of depression, anxiety, alcohol use disorder, substance-induced mood disorder, cocaine use disorder, and PTSD.  She is currently being managed on Latuda 60 mg daily, hydroxyzine 50 mg 3 times daily, gabapentin 300 mg 3 times daily, and Trileptal 300mg  three times daily.  She notes that she has been out of her Latuda for three weeks and notes that she has been having worsening depression and mood swings.  Patient endorses depressive symptoms such as fatigue, difficulty concentrating, fluctuating appetite, disrupted sleep, and anxiety.  Patient notes she continues to be anxious and paranoid reporting that she frequently checking.  She denies SI/HI/VH however she endorses AH. She notes she hears people telling her that people are out to get her.  She also reports that the voices tell her to harm herself. Patient notes that she would never hurt herself and contracts for safety at this time.   Patient informed that she is still maintaining her sobriety with the help of [facility] sober house. At times she notes that she becomes irritable and have mood swings which she notes has worsened since being off of her Latuda. Patient also informed [place] that she has been stressed because her mother died last week. She notes that she and her family just collected enough money to have her cremated. Patient will continue counseling from family services of the [employer] to cope deal with stressors.   Patient informed [place] that she sprained her wrist at work [employer]) but notes she enjoys her job. 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  Patient notes that at times it is difficult for her to afford her  medication. She was given a month sample pack of Latuda 80 mg to help manage her mood and symptoms of psychosis. She was also prescribed Lexapro 10 mg which was sent to [facility] where she could receive here medication for free. She is agreeable to continue all other medications as prescribed. Busbar and hydroxyzine was also sent to [facility] for free refills. Potential side effects of medication and risks vs benefits of treatment vs non-treatment were explained and discussed. All questions were answered.She will continue all other medications as prescribed and follow-up with her outpatient counselor for therapy.  No other concerns noted at this time.   Visit Diagnosis:    ICD-10-CM   1. Generalized anxiety disorder  F41.1 busPIRone (BUSPAR) 10 MG tablet    hydrOXYzine (ATARAX/VISTARIL) 50 MG tablet    escitalopram (LEXAPRO) 10 MG tablet  2. Substance induced mood disorder (HCC)  F19.94 gabapentin (NEURONTIN) 300 MG capsule    Oxcarbazepine (TRILEPTAL) 300 MG tablet    lurasidone (LATUDA) 80 MG TABS tablet    Past Psychiatric History:  depression, anxiety, alcohol use disorder, substance-induced mood disorder, cocaine use disorder, and PTSD.  Past Medical History:  Past Medical History:  Diagnosis Date  . Alcohol abuse   . Asthma   . BV (bacterial vaginosis) 03/06/2013  . Chronic dental pain   . Chronic pelvic pain in female   . Depression   . Ectopic pregnancy   . GERD (gastroesophageal reflux disease)   . Panic attacks   . Polysubstance abuse (HCC)    cocaine, opiates, marijuana  . Trichomonas  vaginitis     Past Surgical History:  Procedure Laterality Date  . ECTOPIC PREGNANCY SURGERY      Family Psychiatric History:  Mother Bipolar and schizophrenia   Family History:  Family History  Problem Relation Age of Onset  . COPD Mother   . Stroke Mother   . Heart disease Maternal Grandmother   . Hypertension Maternal Grandmother   . Stroke Maternal Grandfather   . Asthma  Sister   . Down syndrome Sister     Social History:  Social History   Socioeconomic History  . Marital status: Single    Spouse name: Not on file  . Number of children: Not on file  . Years of education: Not on file  . Highest education level: Not on file  Occupational History  . Not on file  Tobacco Use  . Smoking status: Current Every Day Smoker    Packs/day: 1.00    Years: 26.00    Pack years: 26.00    Types: Cigarettes  . Smokeless tobacco: Never Used  Vaping Use  . Vaping Use: Never used  Substance and Sexual Activity  . Alcohol use: Yes    Alcohol/week: 3.0 - 4.0 standard drinks    Types: 3 - 4 Cans of beer per week    Comment: drinks about a 40oz about once a week.   . Drug use: Yes    Types: Marijuana, Cocaine  . Sexual activity: Yes    Birth control/protection: None  Other Topics Concern  . Not on file  Social History Narrative  . Not on file   Social Determinants of Health   Financial Resource Strain:   . Difficulty of Paying Living Expenses: Not on file  Food Insecurity:   . Worried About Programme researcher, broadcasting/film/video in the Last Year: Not on file  . Ran Out of Food in the Last Year: Not on file  Transportation Needs:   . Lack of Transportation (Medical): Not on file  . Lack of Transportation (Non-Medical): Not on file  Physical Activity:   . Days of Exercise per Week: Not on file  . Minutes of Exercise per Session: Not on file  Stress:   . Feeling of Stress : Not on file  Social Connections:   . Frequency of Communication with Friends and Family: Not on file  . Frequency of Social Gatherings with Friends and Family: Not on file  . Attends Religious Services: Not on file  . Active Member of Clubs or Organizations: Not on file  . Attends Banker Meetings: Not on file  . Marital Status: Not on file    Allergies: No Known Allergies  Metabolic Disorder Labs: Lab Results  Component Value Date   HGBA1C 5.3 01/06/2017   MPG 105 01/06/2017    Lab Results  Component Value Date   PROLACTIN 146.6 (H) 06/05/2017   PROLACTIN 54.3 (H) 01/06/2017   Lab Results  Component Value Date   CHOL 128 01/23/2019   TRIG 317 (H) 01/23/2019   HDL 40 (L) 01/23/2019   CHOLHDL 3.2 01/23/2019   VLDL 63 (H) 01/23/2019   LDLCALC 25 01/23/2019   LDLCALC 23 06/05/2017   Lab Results  Component Value Date   TSH 1.973 01/23/2019   TSH 2.840 06/05/2017    Therapeutic Level Labs: No results found for: LITHIUM No results found for: VALPROATE No components found for:  CBMZ  Current Medications: Current Outpatient Medications  Medication Sig Dispense Refill  . acyclovir ointment (ZOVIRAX) 5 %  Apply topically 4 (four) times daily. To lip 5 g 0  . busPIRone (BUSPAR) 10 MG tablet Take 1 tablet (10 mg total) by mouth 3 (three) times daily. 90 tablet 2  . cyclobenzaprine (FLEXERIL) 10 MG tablet Take 1 tablet (10 mg total) by mouth 2 (two) times daily as needed for muscle spasms. 20 tablet 0  . escitalopram (LEXAPRO) 10 MG tablet Take 1 tablet (10 mg total) by mouth daily. 30 tablet 2  . gabapentin (NEURONTIN) 300 MG capsule Take 1 capsule (300 mg total) by mouth 3 (three) times daily. 90 capsule 2  . hydrocerin (EUCERIN) CREA Apply 1 application topically 3 (three) times daily as needed (eczema). 113 g 0  . hydrOXYzine (ATARAX/VISTARIL) 50 MG tablet Take 1 tablet (50 mg total) by mouth 3 (three) times daily. 90 tablet 2  . lurasidone (LATUDA) 80 MG TABS tablet Take 1 tablet (80 mg total) by mouth daily with breakfast. 30 tablet 2  . metroNIDAZOLE (FLAGYL) 500 MG tablet Take 1 tablet (500 mg total) by mouth 2 (two) times daily. 7 tablet 0  . neomycin-bacitracin-polymyxin (NEOSPORIN) OINT Apply 1 application topically as needed for irritation. 1 Tube 0  . nicotine (NICODERM CQ - DOSED IN MG/24 HOURS) 21 mg/24hr patch Place 1 patch (21 mg total) onto the skin daily. 28 patch 0  . Oxcarbazepine (TRILEPTAL) 300 MG tablet Take 1 tablet (300 mg total) by  mouth in the morning, at noon, and at bedtime. 90 tablet 2  . pantoprazole (PROTONIX) 20 MG tablet Take 2 tablets (40 mg total) by mouth daily for 14 days. 28 tablet 0  . promethazine (PHENERGAN) 25 MG tablet Take 1 tablet (25 mg total) by mouth every 6 (six) hours as needed for nausea or vomiting. 30 tablet 0   No current facility-administered medications for this visit.     Musculoskeletal: Strength & Muscle Tone: within normal limits Gait & Station: normal Patient leans: N/A  Psychiatric Specialty Exam: Review of Systems  unknown if currently breastfeeding.There is no height or weight on file to calculate BMI.  General Appearance: Well Groomed  Eye Contact:  Good  Speech:  Clear and Coherent and Normal Rate  Volume:  Normal  Mood:  Anxious and Depressed  Affect:  Congruent  Thought Process:  Coherent, Goal Directed and Linear  Orientation:  Full (Time, Place, and Person)  Thought Content: Logical, Hallucinations: Auditory and Paranoid Ideation   Suicidal Thoughts:  No  Homicidal Thoughts:  No  Memory:  Immediate;   Good Recent;   Good Remote;   Good  Judgement:  Good  Insight:  Good  Psychomotor Activity:  Normal  Concentration:  Concentration: Good and Attention Span: Good  Recall:  Good  Fund of Knowledge: Good  Language: Good  Akathisia:  No  Handed:  Right  AIMS (if indicated): Not done  Assets:  Communication Skills Desire for Improvement Financial Resources/Insurance Housing Social Support  ADL's:  Intact  Cognition: WNL  Sleep:  Fair   Screenings: AIMS     [facility]  AIMS Total Score 0    AUDIT     [facility]) [facility] Ad[facility] (Discharged[facility] Ad[facility] (Discharged) [facility]  [facility] (Dis[facility]  Alcohol  Use Disorder Identification Test Final Score (AUDIT) 0 15 30 4 21     PHQ2-9     Initial Prenatal from 03/08/2017 in [facility]  PHQ-2 Total Score 2  PHQ-9 Total Score 5       Assessment and Plan: Patient endorses worsening depression, disturbed sleep, irritability, and AH. Patient notes that at times it is difficult for her to afford her medication. She was given a month sample pack of Latuda 80 mg to help manage her mood and symptoms of psychosis. She was also prescribed Lexapro 10 mg to manage depression. Buspar, Lexapto and hydroxyzine was also sent to [facility] for free refills. She will continue all other medications as prescribed.  1. Generalized anxiety disorder  Continue- busPIRone (BUSPAR) 10 MG tablet; Take 1 tablet (10 mg total) by mouth 3 (three) times daily.  Dispense: 90 tablet; Refill: 2 Continue- hydrOXYzine (ATARAX/VISTARIL) 50 MG tablet; Take 1 tablet (50 mg total) by mouth 3 (three) times daily.  Dispense: 90 tablet; Refill: 2 Start- escitalopram (LEXAPRO) 10 MG tablet; Take 1 tablet (10 mg total) by mouth daily.  Dispense: 30 tablet; Refill: 2  2. Substance induced mood disorder (HCC)  [place]- gabapentin (NEURONTIN) 300 MG capsule; Take 1 capsule (300 mg total) by mouth 3 (three) times daily.  Dispense: 90 capsule; Refill: 2 Continue- Oxcarbazepine (TRILEPTAL) 300 MG tablet; Take 1 tablet (300 mg total) by mouth in the morning, at noon, and at bedtime.  Dispense: 90 tablet; Refill: 2 Increased- lurasidone (LATUDA) 80 MG TABS tablet; Take 1 tablet (80 mg total) by mouth daily with breakfast.  Dispense: 30 tablet; Refill: 2    Follow up in 1 month Follow up with threapy  03/10/2017, NP 04/17/2020, 9:57 AM

## 2020-04-22 ENCOUNTER — Encounter (HOSPITAL_COMMUNITY): Payer: Federal, State, Local not specified - Other | Admitting: Psychiatry

## 2020-06-04 ENCOUNTER — Encounter (HOSPITAL_COMMUNITY): Payer: Federal, State, Local not specified - Other | Admitting: Psychiatry

## 2020-06-04 ENCOUNTER — Encounter (HOSPITAL_COMMUNITY): Payer: Self-pay | Admitting: Psychiatry

## 2020-07-08 ENCOUNTER — Ambulatory Visit: Payer: Medicaid Other | Admitting: Internal Medicine

## 2020-09-06 ENCOUNTER — Emergency Department (HOSPITAL_COMMUNITY): Payer: No Typology Code available for payment source

## 2020-09-06 ENCOUNTER — Emergency Department (HOSPITAL_COMMUNITY)
Admission: EM | Admit: 2020-09-06 | Discharge: 2020-09-06 | Disposition: A | Discharge status: Home / Self Care | Payer: No Typology Code available for payment source | Attending: Emergency Medicine | Admitting: Emergency Medicine

## 2020-09-06 ENCOUNTER — Other Ambulatory Visit: Payer: Self-pay

## 2020-09-06 ENCOUNTER — Encounter (HOSPITAL_COMMUNITY): Payer: Self-pay

## 2020-09-06 DIAGNOSIS — M79604 Pain in right leg: Secondary | ICD-10-CM | POA: Insufficient documentation

## 2020-09-06 DIAGNOSIS — Y92410 Unspecified street and highway as the place of occurrence of the external cause: Secondary | ICD-10-CM | POA: Diagnosis not present

## 2020-09-06 DIAGNOSIS — J45909 Unspecified asthma, uncomplicated: Secondary | ICD-10-CM | POA: Insufficient documentation

## 2020-09-06 DIAGNOSIS — R519 Headache, unspecified: Secondary | ICD-10-CM | POA: Diagnosis present

## 2020-09-06 DIAGNOSIS — F17210 Nicotine dependence, cigarettes, uncomplicated: Secondary | ICD-10-CM | POA: Diagnosis not present

## 2020-09-06 MED ORDER — ONDANSETRON HCL 4 MG PO TABS: 4.0000 mg | ORAL_TABLET | Freq: Once | ORAL | Status: DC

## 2020-09-06 MED ORDER — OXYCODONE-ACETAMINOPHEN 5-325 MG PO TABS
2.0000 | ORAL_TABLET | Freq: Once | ORAL | Status: AC
  Administered 2020-09-06: 2 via ORAL
  Filled 2020-09-06: qty 2

## 2020-09-06 MED ORDER — CYCLOBENZAPRINE HCL 10 MG PO TABS
10.0000 mg | ORAL_TABLET | Freq: Two times a day (BID) | ORAL | 0 refills | Status: DC | PRN
Start: 2020-09-06 — End: 2021-06-25

## 2020-09-06 NOTE — ED Notes (Signed)
Pt ambulated to bathroom independently

## 2020-09-06 NOTE — ED Notes (Signed)
Discharge instructions discussed with pt. Pt verbalized understanding. Pt stable and ambulatory. No signature pad available. 
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
## 2020-10-02 ENCOUNTER — Ambulatory Visit: Payer: Self-pay | Admitting: *Deleted

## 2020-10-02 ENCOUNTER — Encounter (HOSPITAL_COMMUNITY): Payer: Self-pay | Admitting: Psychiatry

## 2020-10-02 ENCOUNTER — Telehealth (INDEPENDENT_AMBULATORY_CARE_PROVIDER_SITE_OTHER): Payer: No Payment, Other | Admitting: Psychiatry

## 2020-10-02 ENCOUNTER — Other Ambulatory Visit: Payer: Self-pay

## 2020-10-02 DIAGNOSIS — F1994 Other psychoactive substance use, unspecified with psychoactive substance-induced mood disorder: Secondary | ICD-10-CM | POA: Diagnosis not present

## 2020-10-02 DIAGNOSIS — F411 Generalized anxiety disorder: Secondary | ICD-10-CM

## 2020-10-02 MED ORDER — ESCITALOPRAM OXALATE 10 MG PO TABS: 10.0000 mg | ORAL_TABLET | Freq: Every day | ORAL | 2 refills | Status: DC

## 2020-10-02 MED ORDER — NICOTINE 21 MG/24HR TD PT24: 21.0000 mg | MEDICATED_PATCH | Freq: Every day | TRANSDERMAL | 2 refills | Status: DC

## 2020-10-02 MED ORDER — BUSPIRONE HCL 10 MG PO TABS: 10.0000 mg | ORAL_TABLET | Freq: Three times a day (TID) | ORAL | 2 refills | Status: DC

## 2020-10-02 MED ORDER — GABAPENTIN 300 MG PO CAPS: 300.0000 mg | ORAL_CAPSULE | Freq: Three times a day (TID) | ORAL | 2 refills | Status: DC

## 2020-10-02 MED ORDER — HYDROXYZINE HCL 50 MG PO TABS: 50.0000 mg | ORAL_TABLET | Freq: Three times a day (TID) | ORAL | 2 refills | Status: DC

## 2020-10-02 MED ORDER — OXCARBAZEPINE 300 MG PO TABS: 300.0000 mg | ORAL_TABLET | Freq: Three times a day (TID) | ORAL | 2 refills | Status: DC

## 2020-10-02 MED ORDER — LURASIDONE HCL 60 MG PO TABS: 60.0000 mg | ORAL_TABLET | Freq: Every day | ORAL | 2 refills | Status: DC
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
## 2020-10-07 ENCOUNTER — Telehealth (HOSPITAL_COMMUNITY): Payer: Self-pay | Admitting: *Deleted

## 2020-10-07 NOTE — Telephone Encounter (Signed)
COVER MY [employer]S--P.A. INFORMED THAT THE PATIENT HAS FAMILY-PLANNING MEDICAID ONLY  & WILL NEED TO CONTACT HER SOCIAL WORKER TO UPDATE.   SPOKE WITH PATIENT TO INFORM & SHE NOTED THAT MEDICAID WILL NOT PAY FOR MEDICATIONS. THEREFORE lurasidone 60 MG TABS IS UNOBTAINABLE FOR PATIENT WITH MEDICATION COVERAGE.

## 2020-10-08 ENCOUNTER — Encounter (HOSPITAL_COMMUNITY): Payer: Self-pay | Admitting: Emergency Medicine

## 2020-10-08 ENCOUNTER — Emergency Department (HOSPITAL_COMMUNITY): Payer: Medicaid Other

## 2020-10-08 ENCOUNTER — Inpatient Hospital Stay (HOSPITAL_COMMUNITY)
Admission: EM | Admit: 2020-10-08 | Discharge: 2020-10-10 | DRG: 872 | Disposition: A | Discharge status: Home / Self Care | Payer: Medicaid Other | Attending: Family Medicine | Admitting: Family Medicine

## 2020-10-08 DIAGNOSIS — F323 Major depressive disorder, single episode, severe with psychotic features: Secondary | ICD-10-CM | POA: Diagnosis present

## 2020-10-08 DIAGNOSIS — N12 Tubulo-interstitial nephritis, not specified as acute or chronic: Secondary | ICD-10-CM | POA: Diagnosis present

## 2020-10-08 DIAGNOSIS — Z79899 Other long term (current) drug therapy: Secondary | ICD-10-CM

## 2020-10-08 DIAGNOSIS — Z20822 Contact with and (suspected) exposure to covid-19: Secondary | ICD-10-CM | POA: Diagnosis present

## 2020-10-08 DIAGNOSIS — K5909 Other constipation: Secondary | ICD-10-CM | POA: Diagnosis present

## 2020-10-08 DIAGNOSIS — N898 Other specified noninflammatory disorders of vagina: Secondary | ICD-10-CM | POA: Diagnosis present

## 2020-10-08 DIAGNOSIS — F1490 Cocaine use, unspecified, uncomplicated: Secondary | ICD-10-CM

## 2020-10-08 DIAGNOSIS — Z9151 Personal history of suicidal behavior: Secondary | ICD-10-CM

## 2020-10-08 DIAGNOSIS — F1410 Cocaine abuse, uncomplicated: Secondary | ICD-10-CM | POA: Diagnosis present

## 2020-10-08 DIAGNOSIS — F4310 Post-traumatic stress disorder, unspecified: Secondary | ICD-10-CM | POA: Diagnosis present

## 2020-10-08 DIAGNOSIS — F410 Panic disorder [episodic paroxysmal anxiety] without agoraphobia: Secondary | ICD-10-CM | POA: Diagnosis present

## 2020-10-08 DIAGNOSIS — K219 Gastro-esophageal reflux disease without esophagitis: Secondary | ICD-10-CM | POA: Diagnosis present

## 2020-10-08 DIAGNOSIS — Z720 Tobacco use: Secondary | ICD-10-CM

## 2020-10-08 DIAGNOSIS — Z87440 Personal history of urinary (tract) infections: Secondary | ICD-10-CM

## 2020-10-08 DIAGNOSIS — F17210 Nicotine dependence, cigarettes, uncomplicated: Secondary | ICD-10-CM | POA: Diagnosis present

## 2020-10-08 DIAGNOSIS — A4151 Sepsis due to Escherichia coli [E. coli]: Principal | ICD-10-CM

## 2020-10-08 DIAGNOSIS — J45909 Unspecified asthma, uncomplicated: Secondary | ICD-10-CM | POA: Diagnosis present

## 2020-10-08 LAB — CBC
HCT: 38.3 % (ref 36.0–46.0)
Hemoglobin: 13.1 g/dL (ref 12.0–15.0)
MCH: 32.7 pg (ref 26.0–34.0)
MCHC: 34.2 g/dL (ref 30.0–36.0)
MCV: 95.5 fL (ref 80.0–100.0)
Platelets: 250 10*3/uL (ref 150–400)
RBC: 4.01 MIL/uL (ref 3.87–5.11)
RDW: 12.5 % (ref 11.5–15.5)
WBC: 25.4 10*3/uL — ABNORMAL HIGH (ref 4.0–10.5)
nRBC: 0 % (ref 0.0–0.2)

## 2020-10-08 LAB — URINALYSIS, MICROSCOPIC (REFLEX): WBC, UA: >50 WBC/hpf (ref 0–5)

## 2020-10-08 LAB — URINALYSIS, ROUTINE W REFLEX MICROSCOPIC
Glucose, UA: NEGATIVE mg/dL
Ketones, ur: 15 mg/dL — AB
Nitrite: POSITIVE — AB
Protein, ur: 100 mg/dL — AB
Specific Gravity, Urine: >1.03 — ABNORMAL HIGH (ref 1.005–1.030)
pH: 5.5 (ref 5.0–8.0)

## 2020-10-08 LAB — COMPREHENSIVE METABOLIC PANEL
ALT: 39 U/L (ref 0–44)
AST: 32 U/L (ref 15–41)
Albumin: 3.3 g/dL — ABNORMAL LOW (ref 3.5–5.0)
Alkaline Phosphatase: 104 U/L (ref 38–126)
Anion gap: 13 (ref 5–15)
BUN: 8 mg/dL (ref 6–20)
CO2: 20 mmol/L — ABNORMAL LOW (ref 22–32)
Calcium: 9.1 mg/dL (ref 8.9–10.3)
Chloride: 101 mmol/L (ref 98–111)
Creatinine, Ser: 0.85 mg/dL (ref 0.44–1.00)
GFR, Estimated: >60 mL/min (ref >60)
Glucose, Bld: 171 mg/dL — ABNORMAL HIGH (ref 70–99)
Potassium: 3.9 mmol/L (ref 3.5–5.1)
Sodium: 134 mmol/L — ABNORMAL LOW (ref 135–145)
Total Bilirubin: 1.7 mg/dL — ABNORMAL HIGH (ref 0.3–1.2)
Total Protein: 7.5 g/dL (ref 6.5–8.1)

## 2020-10-08 LAB — DIFFERENTIAL
Abs Immature Granulocytes: 0.23 10*3/uL — ABNORMAL HIGH (ref 0.00–0.07)
Basophils Absolute: 0.1 10*3/uL (ref 0.0–0.1)
Basophils Relative: 0 %
Eosinophils Absolute: 0 10*3/uL (ref 0.0–0.5)
Eosinophils Relative: 0 %
Immature Granulocytes: 1 %
Lymphocytes Relative: 8 %
Lymphs Abs: 2 10*3/uL (ref 0.7–4.0)
Monocytes Absolute: 1.3 10*3/uL — ABNORMAL HIGH (ref 0.1–1.0)
Monocytes Relative: 5 %
Neutro Abs: 21.6 10*3/uL — ABNORMAL HIGH (ref 1.7–7.7)
Neutrophils Relative %: 86 %

## 2020-10-08 LAB — RESP PANEL BY RT-PCR (FLU A&B, COVID) ARPGX2
Influenza A by PCR: NEGATIVE
Influenza B by PCR: NEGATIVE
SARS Coronavirus 2 by RT PCR: NEGATIVE

## 2020-10-08 LAB — LACTIC ACID, PLASMA
Lactic Acid, Venous: 3 mmol/L (ref 0.5–1.9)
Lactic Acid, Venous: 1.5 mmol/L (ref 0.5–1.9)

## 2020-10-08 LAB — LIPASE, BLOOD: Lipase: 23 U/L (ref 11–51)

## 2020-10-08 LAB — HIV ANTIBODY (ROUTINE TESTING W REFLEX): HIV Screen 4th Generation wRfx: NONREACTIVE

## 2020-10-08 LAB — I-STAT BETA HCG BLOOD, ED (MC, WL, AP ONLY): I-stat hCG, quantitative: 101.6 m[IU]/mL — ABNORMAL HIGH (ref <5)

## 2020-10-08 LAB — HCG, QUANTITATIVE, PREGNANCY: hCG, Beta Chain, Quant, S: 2 m[IU]/mL (ref <5)

## 2020-10-08 MED ORDER — LACTATED RINGERS IV BOLUS: 1000.0000 mL | Freq: Once | INTRAVENOUS | Status: DC

## 2020-10-08 MED ORDER — SODIUM CHLORIDE 0.9 % IV SOLN: 2.0000 g | INTRAVENOUS | Status: DC

## 2020-10-08 MED ORDER — SODIUM CHLORIDE 0.9 % IV BOLUS
1000.0000 mL | Freq: Once | INTRAVENOUS | Status: AC
  Administered 2020-10-08: 1000 mL via INTRAVENOUS

## 2020-10-08 MED ORDER — LURASIDONE HCL 60 MG PO TABS
60.0000 mg | ORAL_TABLET | Freq: Every day | ORAL | Status: DC
  Filled 2020-10-08: qty 1

## 2020-10-08 MED ORDER — LURASIDONE HCL 40 MG PO TABS
60.0000 mg | ORAL_TABLET | Freq: Every day | ORAL | Status: DC
  Administered 2020-10-09: 60 mg via ORAL
  Filled 2020-10-08 (×2): qty 1

## 2020-10-08 MED ORDER — ACETAMINOPHEN 325 MG PO TABS
650.0000 mg | ORAL_TABLET | Freq: Four times a day (QID) | ORAL | Status: DC | PRN
Start: 2020-10-08 — End: 2020-10-08

## 2020-10-08 MED ORDER — KETOROLAC TROMETHAMINE 15 MG/ML IJ SOLN: 15.0000 mg | Freq: Four times a day (QID) | INTRAMUSCULAR | Status: DC | PRN

## 2020-10-08 MED ORDER — IOHEXOL 300 MG/ML  SOLN
100.0000 mL | Freq: Once | INTRAMUSCULAR | Status: AC | PRN
  Administered 2020-10-08: 100 mL via INTRAVENOUS

## 2020-10-08 MED ORDER — ENOXAPARIN SODIUM 40 MG/0.4ML ~~LOC~~ SOLN
40.0000 mg | SUBCUTANEOUS | Status: DC
  Administered 2020-10-08 – 2020-10-09 (×2): 40 mg via SUBCUTANEOUS
  Filled 2020-10-08 (×2): qty 0.4

## 2020-10-08 MED ORDER — LACTATED RINGERS IV SOLN
INTRAVENOUS | Status: DC
  Administered 2020-10-08: 1000 mL via INTRAVENOUS

## 2020-10-08 MED ORDER — MORPHINE SULFATE (PF) 4 MG/ML IV SOLN
4.0000 mg | Freq: Once | INTRAVENOUS | Status: AC
Start: 2020-10-08 — End: 2020-10-08
  Administered 2020-10-08: 4 mg via INTRAVENOUS
  Filled 2020-10-08: qty 1

## 2020-10-08 MED ORDER — HYDROXYZINE HCL 25 MG PO TABS
50.0000 mg | ORAL_TABLET | Freq: Once | ORAL | Status: AC
  Administered 2020-10-09: 50 mg via ORAL
  Filled 2020-10-08: qty 2

## 2020-10-08 MED ORDER — FENTANYL CITRATE (PF) 100 MCG/2ML IJ SOLN
50.0000 ug | Freq: Once | INTRAMUSCULAR | Status: AC
Start: 2020-10-08 — End: 2020-10-08
  Administered 2020-10-08: 50 ug via INTRAVENOUS
  Filled 2020-10-08: qty 2

## 2020-10-08 MED ORDER — NICOTINE 21 MG/24HR TD PT24
21.0000 mg | MEDICATED_PATCH | Freq: Every day | TRANSDERMAL | Status: DC
  Administered 2020-10-08 – 2020-10-10 (×3): 21 mg via TRANSDERMAL
  Filled 2020-10-08 (×3): qty 1

## 2020-10-08 MED ORDER — OXCARBAZEPINE 300 MG PO TABS
300.0000 mg | ORAL_TABLET | Freq: Three times a day (TID) | ORAL | Status: DC
  Administered 2020-10-08 – 2020-10-09 (×3): 300 mg via ORAL
  Filled 2020-10-08 (×8): qty 1

## 2020-10-08 MED ORDER — KETOROLAC TROMETHAMINE 15 MG/ML IJ SOLN
15.0000 mg | Freq: Four times a day (QID) | INTRAMUSCULAR | Status: DC | PRN
  Administered 2020-10-08 – 2020-10-09 (×3): 15 mg via INTRAVENOUS
  Filled 2020-10-08 (×3): qty 1

## 2020-10-08 MED ORDER — SODIUM CHLORIDE 0.9 % IV SOLN
1.0000 g | INTRAVENOUS | Status: DC
  Filled 2020-10-08: qty 10

## 2020-10-08 MED ORDER — LACTATED RINGERS IV BOLUS
1000.0000 mL | Freq: Once | INTRAVENOUS | Status: AC
  Administered 2020-10-08: 1000 mL via INTRAVENOUS

## 2020-10-08 MED ORDER — ACETAMINOPHEN 650 MG RE SUPP: 650.0000 mg | Freq: Four times a day (QID) | RECTAL | Status: DC | PRN

## 2020-10-08 MED ORDER — ACETAMINOPHEN 500 MG PO TABS
1000.0000 mg | ORAL_TABLET | Freq: Four times a day (QID) | ORAL | Status: DC
  Administered 2020-10-08 – 2020-10-10 (×7): 1000 mg via ORAL
  Filled 2020-10-08 (×8): qty 2

## 2020-10-08 MED ORDER — PANTOPRAZOLE SODIUM 40 MG PO TBEC
40.0000 mg | DELAYED_RELEASE_TABLET | Freq: Every day | ORAL | Status: DC
  Administered 2020-10-08 – 2020-10-10 (×3): 40 mg via ORAL
  Filled 2020-10-08 (×3): qty 1

## 2020-10-08 MED ORDER — BUSPIRONE HCL 5 MG PO TABS
10.0000 mg | ORAL_TABLET | Freq: Three times a day (TID) | ORAL | Status: DC
  Administered 2020-10-08 – 2020-10-09 (×3): 10 mg via ORAL
  Filled 2020-10-08 (×2): qty 2
  Filled 2020-10-08: qty 1
  Filled 2020-10-08 (×2): qty 2

## 2020-10-08 MED ORDER — KETOROLAC TROMETHAMINE 15 MG/ML IJ SOLN: 15.0000 mg | Freq: Four times a day (QID) | INTRAMUSCULAR | Status: DC

## 2020-10-08 MED ORDER — POLYETHYLENE GLYCOL 3350 17 G PO PACK: 17.0000 g | PACK | Freq: Every day | ORAL | Status: DC | PRN

## 2020-10-08 MED ORDER — SODIUM CHLORIDE 0.9 % IV SOLN: INTRAVENOUS | Status: DC

## 2020-10-08 MED ORDER — SODIUM CHLORIDE 0.9 % IV SOLN
1.0000 g | Freq: Once | INTRAVENOUS | Status: AC
  Administered 2020-10-08: 1 g via INTRAVENOUS
  Filled 2020-10-08: qty 10

## 2020-10-08 MED ORDER — ESCITALOPRAM OXALATE 10 MG PO TABS
10.0000 mg | ORAL_TABLET | Freq: Every day | ORAL | Status: DC
  Administered 2020-10-08 – 2020-10-09 (×2): 10 mg via ORAL
  Filled 2020-10-08 (×3): qty 1

## 2020-10-08 NOTE — ED Notes (Signed)
Patient transported to CT 

## 2020-10-08 NOTE — Hospital Course (Addendum)
Susan [name] is a 45 y.o. female who presented with left flank pain, found to have pyelonephritis. PMH is significant for UTIs, depression with psychotic features, tobacco use, prior cocaine use. Below is her hospital course, refer to the H&P for additional information.   Pyelonephritis  In ED, patient tachycardic to 130's, hypotensive to 90's/80's. Lactic acid 3. Received 2L NA boluses. WBC 25.4. CT consistent with pyelonephritis, negative for renal stone. Patient started on Ceftriaxone. Initial pain control in ED with 4 mg Morphine x1, 50 mcg Fentanyl x1 and Tyelnol x1. On admission, pain control with scheduled Tylenol and Toradol PRN. Additional NS bolus given on admission given consistently soft BP's. Lactic acid trended and improved to 1.5. Urine culture did not grow anything, but was collected after antibiotics were initiated. Blood culture grew E. Coli. Patient was transitioned to cefdinir upon discharge to finish out 7 total antibiotic days.    Depression with Psychotic Features  Hx of previous suicide attempt. Previously had been on medications but reported being off of them for the past year. Patient noted anxiety on admission and requested to restart her psych medication. Trileptal, Buspar, Lexapro and Lurasidone were restarted. Hydroxyzine was held.  Polysubstance Abuse History of previous cocaine and alcohol abuse. UDS on admission showed cocaine positive. Second UDS obtained was positive, though patient had received fentanyl in the ED for pain management.    All other chronic conditions remained stable during admission and treated with home medications as appropriate.   Issues for follow-up: PCP follow-up outpatient to monitor for improvement of symptoms and completion of antibiotic therapy. Consider evaluation of psychiatric medications, patient was sleepy but easily arousable with administration of all medications. PCP follow-up for monitoring of Hgb, consideration for iron  studies/further investigation outpatient.
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
## 2020-10-08 NOTE — Progress Notes (Signed)
Interim Progress Note  S: In to check-in on patient at start of shift. States she continues to have some pain. Notes that her head hurts and that left-side of back hurts. The pain isn't much improved from when she arrived at the ED. She also notes she is having sweats. She has had previous UTI's in the past but has "never had it this bad". She is still able to get up and move but does feel dizzy when standing.  O:  Gen: Awake, alert, diaphoretic, standing next to bed, able to move onto bed for exam Cardio: RRR, without murmur Lungs: CTAB without wheezing/rhonchi/rales Back: Left CVA tenderness, no right CVAT Abdomen: Normoactive bowel sounds, soft, diffuse tenderness in all quadrants with pain radiating to left-side of abdomen, no rebound/guarding Ext: No edema, 2+ radial, PT and DP pulses b/l    A/P:  46 y/o F with PMHx of depression, panic attacks, polysubstance abuse, chronic pelvic pain who is admitted for pyelonephritis. At time of encounter, patient was s/p 1g Tylenol and 50 mcg fentanyl, 4 mg morphine for pain control. She had not yet received Toradol at the time (though she has now). Her tachycardia appears improved though her BP's continue to be stable but soft (100's/50's). Patient appears euvolemic on exam.  -Plan per H&P -Continue to monitor pain/BP's -Pain control with scheduled Tylenol and Toradol q6h PRN

## 2020-10-08 NOTE — ED Triage Notes (Signed)
Patient complains of left sided abdominal pain  generalized body aches for the last three days. Denies cough and fever, afebrile in triage. Patient alert, oriented, and in no apparent distress at this time.

## 2020-10-08 NOTE — H&P (Cosign Needed Addendum)
Family Medicine Teaching [facility] Admission History and Physical Service Pager: [phone]  Patient name: Susan [name] Medical record number: [id number] Date of birth: 01/13/1976 Age: 45 y.o. Gender: female  Primary Care Provider: Dartha Lodge, FNP Consultants:  Code Status: FULL  Preferred Emergency Contact: Chaney Malling [phone]   Chief Complaint: left flank pain   Assessment and Plan: Susan [name] is a 45 y.o. female presenting with left flank pain . PMH is significant for UTIs, depression with psychotic features, tobacco use, prior cocaine use.   Pyelonephritis Patient endorses 3 days of dysuria, urinary frequency, back pain, headache, fatigue, and chills. CT with areas of decreased enhancement within the left kidney with surrounding perinephric stranding consistent with pyelonephritis. No stone present. UA with nitrites, mod leukocytes, hgb, bilirubin, ketones, protein. Labs significant for WBC 25.4, lactic acid 3.0. In ED patient received 1g Ceftriaxone, 2L fluid boluses. For pain was given 4mg  Morphine and Fentanyl. On physical exam, patient tachycardic to 130, and hypotensive to 96/80. Left renal angle tenderness, (L>R). Current MAP is 75. Patient's symptoms consistent with pyelonephritis. Considered renal stone however CT negative. Also considered left lower lobe PNA however no dyspnea, chest pain, normal lung exam with normal saturations.  Will admit for IV abx, fluids, and pain management.  -Admit to Med-tele, attending Dr. -Vitals per floor routine -Up with assistance -Rocephin 1 g every 24 hours, de-escalate after 48 hours as appropriate -Monitor flank pain -Analgesia: Tylenol q6H. Toradol q6prn  -LR mIVF 120 ml/h, further 1L boluses if remains hypotensive  -Trend LA - AM BMP, CBC -f/u urine cx  -f/u blood cx -f/u UDS -f/u EKG - PT/OT   Constipation Last bm this morning, usually does not have problems with BMs but feels she has been constipated  in the last week.   - Miralax prn   Tobacco misuse disorder Smokes 2 packs a day. Has quit in the past but restarted back when her mother passed away - Nicotine patch 21mg  daily   Depression with psychotic features  Hx of suicide attempt. Denies current suicidal ideation.Reports being off meds for a year but requesting to start back her psych meds during the hospitalization.  She is reporting anxiety today Home medications include: Trileptal, Buspar, hydroxyzine, lexapro, Lurasidone -Lurasidone 60mg  daily  -Lexapro 10mg  -Trileptal 300mg  morning, noon, bedtime -Buspar 10 mg twice daily -Hold hydroxyzine for now given that we are starting multiple psychiatric medications at once.  Polysubstance abuse Reports last using cocaine and alcohol last summer after completing a substance use program -Follow up UDS  FEN/GI: LR, regular diet  Prophylaxis: lovenox   Disposition: med surg  History of Present Illness:  Susan [name] is a 45 y.o. female presenting with dysuria, left flank pain, weakness, and chills.   Pt reports 3 day hx of weakness.,headache, left flank pain, cold chills and sweats. Also reports pressure when trying to pass urine and frequency. States that when she does urinate, not much comes out. Denies hematuria. Denies emesis. Reports hx of UTIs- about 2 a year. Reports feeling overall weak.   Hx of ETOH and cocaine use. States she stopped both of these last summer when she completed a substance use program   Review Of Systems: Per HPI with the following additions:   Review of Systems  Respiratory: Positive for cough. Negative for shortness of breath.   Cardiovascular: Negative for chest pain.  Gastrointestinal: Positive for abdominal pain and constipation. Negative for vomiting.  Genitourinary: Positive for  flank pain and frequency.  Neurological: Positive for headaches.     Patient Active Problem List   Diagnosis Date Noted  . Polysubstance dependence (HCC)  07/27/2019  . MDD (major depressive disorder), recurrent episode, severe (HCC) 01/23/2019  . PTSD (post-traumatic stress disorder) 06/07/2017  . Substance induced mood disorder (HCC) 06/04/2017  . Preterm labor 04/10/2017  . Asymptomatic bacteriuria during pregnancy in second trimester 03/14/2017  . Late prenatal care in second trimester 03/08/2017  . Supervision of normal pregnancy 03/08/2017  . MDD (major depressive disorder), single episode, severe with psychotic features (HCC) 01/05/2017  . Alcohol use disorder, severe, dependence (HCC) 01/05/2017  . Cocaine use disorder, moderate, dependence (HCC) 01/05/2017  . Cannabis use disorder, moderate, dependence (HCC) 01/05/2017  . Heavy smoker 02/03/2016  . Cocaine abuse affecting pregnancy, antepartum (HCC) 02/03/2016  . Chronic, continuous use of opioids 03/24/2015  . Depression with anxiety 03/24/2015  . Poor dentition 03/24/2015  . Sprain of ankle, unspecified site 05/28/2009    Past Medical History: Past Medical History:  Diagnosis Date  . Alcohol abuse   . Asthma   . BV (bacterial vaginosis) 03/06/2013  . Chronic dental pain   . Chronic pelvic pain in female   . Depression   . Ectopic pregnancy   . GERD (gastroesophageal reflux disease)   . Panic attacks   . Polysubstance abuse (HCC)    cocaine, opiates, marijuana  . Trichomonas vaginitis     Past Surgical History: Past Surgical History:  Procedure Laterality Date  . ECTOPIC PREGNANCY SURGERY      Social History: Social History   Tobacco Use  . Smoking status: Current Every Day Smoker    Packs/day: 1.00    Years: 26.00    Pack years: 26.00    Types: Cigarettes  . Smokeless tobacco: Never Used  Vaping Use  . Vaping Use: Never used  Substance Use Topics  . Alcohol use: Yes    Alcohol/week: 3.0 - 4.0 standard drinks    Types: 3 - 4 Cans of beer per week    Comment: drinks about a 40oz about once a week.   . Drug use: Yes    Types: Marijuana, Cocaine    Additional social history:  Please also refer to relevant sections of EMR.  Family History: Family History  Problem Relation Age of Onset  . COPD Mother   . Stroke Mother   . Heart disease Maternal Grandmother   . Hypertension Maternal Grandmother   . Stroke Maternal Grandfather   . Asthma Sister   . Down syndrome Sister     Allergies and Medications: No Known Allergies No current facility-administered medications on file prior to encounter.   Current Outpatient Medications on File Prior to Encounter  Medication Sig Dispense Refill  . acyclovir ointment (ZOVIRAX) 5 % Apply topically 4 (four) times daily. To lip 5 g 0  . busPIRone (BUSPAR) 10 MG tablet Take 1 tablet (10 mg total) by mouth 3 (three) times daily. 90 tablet 2  . cyclobenzaprine (FLEXERIL) 10 MG tablet Take 1 tablet (10 mg total) by mouth 2 (two) times daily as needed for muscle spasms. 10 tablet 0  . escitalopram (LEXAPRO) 10 MG tablet Take 1 tablet (10 mg total) by mouth daily. 30 tablet 2  . gabapentin (NEURONTIN) 300 MG capsule Take 1 capsule (300 mg total) by mouth 3 (three) times daily. 90 capsule 2  . hydrocerin (EUCERIN) CREA Apply 1 application topically 3 (three) times daily as needed (eczema). 113 g  0  . hydrOXYzine (ATARAX/VISTARIL) 50 MG tablet Take 1 tablet (50 mg total) by mouth 3 (three) times daily. 90 tablet 2  . lurasidone 60 MG TABS Take 1 tablet (60 mg total) by mouth daily with breakfast. 30 tablet 2  . metroNIDAZOLE (FLAGYL) 500 MG tablet Take 1 tablet (500 mg total) by mouth 2 (two) times daily. 7 tablet 0  . neomycin-bacitracin-polymyxin (NEOSPORIN) OINT Apply 1 application topically as needed for irritation. 1 Tube 0  . nicotine (NICODERM CQ - DOSED IN MG/24 HOURS) 21 mg/24hr patch Place 1 patch (21 mg total) onto the skin daily. 30 patch 2  . Oxcarbazepine (TRILEPTAL) 300 MG tablet Take 1 tablet (300 mg total) by mouth in the morning, at noon, and at bedtime. 90 tablet 2  . pantoprazole  (PROTONIX) 20 MG tablet Take 2 tablets (40 mg total) by mouth daily for 14 days. 28 tablet 0  . promethazine (PHENERGAN) 25 MG tablet Take 1 tablet (25 mg total) by mouth every 6 (six) hours as needed for nausea or vomiting. 30 tablet 0    Objective: BP (!) 128/55 (BP Location: Left Arm)   Pulse (!) 130   Temp 98.7 F (37.1 C) (Oral)   Resp 18   LMP 09/10/2020   SpO2 98%    Exam: General: alert, in discomfort, pleasant, cooperative Eyes: EOMI, no scleral icterus ENTM: MMM Neck: Supple. Normal ROM Cardiovascular: Tachycardic, regular rhythm. no murmurs Respiratory: CTAB. Normal WOB Gastrointestinal: abdomen soft, non distended, diffusely tender to palpation in all quadrants  MSK: CVA tenderness on left side. Moving extremities spontaneously  Derm: no visible rashes. Skin warm and dry Neuro: no focal deficits  Psych: mood and affect appropriate   Labs and Imaging: CBC BMET  Recent Labs  Lab 10/08/20 1010  WBC 25.4*  HGB 13.1  HCT 38.3  PLT 250   Recent Labs  Lab 10/08/20 1010  NA 134*  K 3.9  CL 101  CO2 20*  BUN 8  CREATININE 0.85  GLUCOSE 171*  CALCIUM 9.1     EKG: pending    Towanda Octave, MD 10/08/2020, 3:32 PM PGY-1, [facility] FPTS Intern pager: [phone], text pages welcome  FPTS Upper-Level Resident Addendum   I have independently interviewed and examined the patient. I have discussed the above with the original author and agree with their documentation. My edits for correction/addition/clarification are in blue. Please see also any attending notes.   Towanda Octave MD PGY-2, [facility] 10/08/2020 5:53 PM  FPTS Service pager: [phone] (text pages welcome through [facility])

## 2020-10-09 ENCOUNTER — Other Ambulatory Visit: Payer: Self-pay

## 2020-10-09 ENCOUNTER — Encounter (HOSPITAL_COMMUNITY): Payer: Self-pay | Admitting: Family Medicine

## 2020-10-09 LAB — BLOOD CULTURE ID PANEL (REFLEXED) - BCID2

## 2020-10-09 LAB — RAPID URINE DRUG SCREEN, HOSP PERFORMED
Amphetamines: NOT DETECTED
Amphetamines: NOT DETECTED
Barbiturates: NOT DETECTED
Barbiturates: NOT DETECTED
Benzodiazepines: NOT DETECTED
Benzodiazepines: NOT DETECTED
Cocaine: POSITIVE — AB
Cocaine: POSITIVE — AB
Opiates: NOT DETECTED
Opiates: POSITIVE — AB
Tetrahydrocannabinol: NOT DETECTED
Tetrahydrocannabinol: NOT DETECTED

## 2020-10-09 LAB — BASIC METABOLIC PANEL
Anion gap: 10 (ref 5–15)
BUN: 7 mg/dL (ref 6–20)
CO2: 18 mmol/L — ABNORMAL LOW (ref 22–32)
Calcium: 8.4 mg/dL — ABNORMAL LOW (ref 8.9–10.3)
Chloride: 108 mmol/L (ref 98–111)
Creatinine, Ser: 0.77 mg/dL (ref 0.44–1.00)
GFR, Estimated: >60 mL/min (ref >60)
Glucose, Bld: 217 mg/dL — ABNORMAL HIGH (ref 70–99)
Potassium: 3.5 mmol/L (ref 3.5–5.1)
Sodium: 136 mmol/L (ref 135–145)

## 2020-10-09 LAB — CBC
HCT: 30.1 % — ABNORMAL LOW (ref 36.0–46.0)
Hemoglobin: 10.3 g/dL — ABNORMAL LOW (ref 12.0–15.0)
MCH: 32.9 pg (ref 26.0–34.0)
MCHC: 34.2 g/dL (ref 30.0–36.0)
MCV: 96.2 fL (ref 80.0–100.0)
Platelets: 165 10*3/uL (ref 150–400)
RBC: 3.13 MIL/uL — ABNORMAL LOW (ref 3.87–5.11)
RDW: 12.9 % (ref 11.5–15.5)
WBC: 17.3 10*3/uL — ABNORMAL HIGH (ref 4.0–10.5)
nRBC: 0 % (ref 0.0–0.2)

## 2020-10-09 LAB — LACTIC ACID, PLASMA: Lactic Acid, Venous: 1.2 mmol/L (ref 0.5–1.9)

## 2020-10-09 MED ORDER — KETOROLAC TROMETHAMINE 30 MG/ML IJ SOLN
30.0000 mg | Freq: Once | INTRAMUSCULAR | Status: AC
  Administered 2020-10-09: 30 mg via INTRAVENOUS
  Filled 2020-10-09: qty 1

## 2020-10-09 MED ORDER — SODIUM CHLORIDE 0.9 % IV SOLN
2.0000 g | INTRAVENOUS | Status: DC
  Administered 2020-10-09 – 2020-10-10 (×2): 2 g via INTRAVENOUS
  Filled 2020-10-09 (×2): qty 2

## 2020-10-09 MED ORDER — TRAMADOL HCL 50 MG PO TABS
100.0000 mg | ORAL_TABLET | Freq: Three times a day (TID) | ORAL | Status: DC | PRN
Start: 2020-10-09 — End: 2020-10-10

## 2020-10-09 NOTE — Progress Notes (Signed)
Took meds to give pt, she refused Trilepta and Buspar because she feels like that is what caused her to be "out of it" all day.  Paged MD at 1941 to let them know about refusal. MD said to watch for any signs of withdrawal.  Pt stated she didn't remember anything from today, except seeing my face once when she opened her eyes.

## 2020-10-09 NOTE — Evaluation (Signed)
Physical Therapy Evaluation Patient Details Name: Susan [name] MRN: [id number] DOB: [birth year] Today's Date: 10/09/2020   History of Present Illness  Pt is a 45 year old woman admitted on 10/08/20 with polynephritis. PMH: depression, anxiety, polysubstance abuse, chronic pelvic pain.  Clinical Impression  Pt admitted with above diagnosis.  She has been transferring and ambulating independently in room.  She demonstrate safe and normal gait pattern with PT.  Scored 24/24 on DGI indicating low fall risk.  Pt is at baseline.  No skilled PT indicated.     Follow Up Recommendations No PT follow up    Equipment Recommendations  None recommended by PT    Recommendations for Other Services       Precautions / Restrictions Precautions Precautions: None      Mobility  Bed Mobility Overal bed mobility: Independent                  Transfers Overall transfer level: Independent                  Ambulation/Gait Ambulation/Gait assistance: Supervision Gait Distance (Feet): 400 Feet Assistive device: None Gait Pattern/deviations: WFL(Within Functional Limits) Gait velocity: fast   General Gait Details: Ambulated with safe, normal gait and normal to fast speed.  Has been ambulating in room independently  Stairs            Wheelchair Mobility    Modified Rankin (Stroke Patients Only)       Balance Overall balance assessment: Independent                               Standardized Balance Assessment Standardized Balance Assessment : Dynamic Gait Index   Dynamic Gait Index Level Surface: Normal Change in Gait Speed: Normal Gait with Horizontal Head Turns: Normal Gait with Vertical Head Turns: Normal Gait and Pivot Turn: Normal Step Over Obstacle: Normal Step Around Obstacles: Normal Steps: Normal Total Score: 24       Pertinent Vitals/Pain Pain Assessment: No/denies pain    Home Living                        Prior  Function                 Hand Dominance        Extremity/Trunk Assessment   Upper Extremity Assessment Upper Extremity Assessment: Overall WFL for tasks assessed    Lower Extremity Assessment Lower Extremity Assessment: Overall WFL for tasks assessed    Cervical / Trunk Assessment Cervical / Trunk Assessment: Normal  Communication      Cognition                                              General Comments      Exercises     Assessment/Plan    PT Assessment Patent does not need any further PT services  PT Problem List         PT Treatment Interventions      PT Goals (Current goals can be found in the Care Plan section)  Acute Rehab PT Goals Patient Stated Goal: to return home PT Goal Formulation: All assessment and education complete, DC therapy    Frequency     Barriers to discharge  Co-evaluation               AM-PAC PT "6 Clicks" Mobility  Outcome Measure Help needed turning from your back to your side while in a flat bed without using bedrails?: None Help needed moving from lying on your back to sitting on the side of a flat bed without using bedrails?: None Help needed moving to and from a bed to a chair (including a wheelchair)?: None Help needed standing up from a chair using your arms (e.g., wheelchair or bedside chair)?: None Help needed to walk in hospital room?: None Help needed climbing 3-5 steps with a railing? : None 6 Click Score: 24    End of Session   Activity Tolerance: Patient tolerated treatment well Patient left: in bed;with call bell/phone within reach;with family/visitor present Nurse Communication: Mobility status      Time: 2536-6440 PT Time Calculation (min) (ACUTE ONLY): 10 min   Charges:   PT Evaluation $PT Eval Low Complexity: 1 Low          Susan [name], PT Acute Rehab Services Pager [phone] [facility] [phone]    Susan [name] 10/09/2020, 4:46 PM

## 2020-10-09 NOTE — Evaluation (Signed)
Occupational Therapy Evaluation and Discharge Patient Details Name: Susan [name] MRN: [id number] DOB: Nov 15, 1975 Today's Date: 10/09/2020    History of Present Illness Pt is a 45 year old woman admitted on 10/08/20 with polynephritis. PMH: depression, anxiety, polysubstance abuse, chronic pelvic pain.   Clinical Impression   Pt is functioning modified independently to independently in ADL and ADL transfers. Requesting a 3 in 1 for home to elevate toilet. No further OT needs.     Follow Up Recommendations  No OT follow up    Equipment Recommendations  3 in 1 bedside commode    Recommendations for Other Services       Precautions / Restrictions Precautions Precautions: None      Mobility Bed Mobility               General bed mobility comments: NT    Transfers Overall transfer level: Independent Equipment used:  (pushed IV pole)                  Balance Overall balance assessment: Modified Independent                                         ADL either performed or assessed with clinical judgement   ADL Overall ADL's : Modified independent                                             Vision Baseline Vision/History: No visual deficits       Perception     Praxis      Pertinent Vitals/Pain Pain Assessment: Faces Faces Pain Scale: Hurts little more Pain Location: kidneys and head Pain Descriptors / Indicators: Aching Pain Intervention(s): Monitored during session     Hand Dominance Right   Extremity/Trunk Assessment Upper Extremity Assessment Upper Extremity Assessment: Overall WFL for tasks assessed   Lower Extremity Assessment Lower Extremity Assessment: Defer to PT evaluation   Cervical / Trunk Assessment Cervical / Trunk Assessment: Normal   Communication Communication Communication: No difficulties   Cognition Arousal/Alertness: Awake/alert Behavior During Therapy: WFL for tasks  assessed/performed Overall Cognitive Status: Within Functional Limits for tasks assessed                                     General Comments       Exercises     Shoulder Instructions      Home Living Family/patient expects to be discharged to:: Private residence Living Arrangements: Spouse/significant other Available Help at Discharge: Family Type of Home: House Home Access: Level entry     Home Layout: One level     Bathroom Shower/Tub: Chief Strategy Officer: Standard     Home Equipment: None          Prior Functioning/Environment Level of Independence: Independent                 OT Problem List:        OT Treatment/Interventions:      OT Goals(Current goals can be found in the care plan section) Acute Rehab OT Goals Patient Stated Goal: to return home  OT Frequency:     Barriers to D/C:  Co-evaluation              AM-PAC OT "6 Clicks" Daily Activity     Outcome Measure Help from another person eating meals?: None Help from another person taking care of personal grooming?: None Help from another person toileting, which includes using toliet, bedpan, or urinal?: None Help from another person bathing (including washing, rinsing, drying)?: None Help from another person to put on and taking off regular upper body clothing?: None Help from another person to put on and taking off regular lower body clothing?: None 6 Click Score: 24   End of Session    Activity Tolerance: Patient tolerated treatment well Patient left: in chair;with call bell/phone within reach  OT Visit Diagnosis: Pain                Time: 1015-1029 OT Time Calculation (min): 14 min Charges:  OT General Charges $OT Visit: 1 Visit OT Evaluation $OT Eval Low Complexity: 1 Low  Martie Round, OTR/L Acute Rehabilitation Services Pager: [phone] Office: [phone]  Evern Bio 10/09/2020, 10:35 AM

## 2020-10-09 NOTE — Progress Notes (Signed)
Pt arousable and sitting up. Paged MD to ask if pt should have next dose of psych meds d/t condition during the day. MD said to give them.

## 2020-10-09 NOTE — Progress Notes (Signed)
S: Requested by the RN to evaluate patient as she is seen and more sleepy than usual. Nursing previously requested a repeat UDS as patient seemed more altered at time previously. In the room, patient easily aroused though she does fall asleep in between questioning, but she is able to pick up with the conversation left off. She does report that sometimes her psych medications cause her to have some worsened pain is at home-her husband, who is at bedside, confirms this.  O: Blood pressure (!) 101/58, pulse 79, temperature (!) 97.5 F (36.4 C), resp. rate 17, last menstrual period 09/10/2020, SpO2 97 %, unknown if currently breastfeeding. Gen: NAD, sleeping initially but easily awoken to knocking at the door. Patient falls asleep during questioning, but is easily awoken with light touch and is able to pick back up on the conversation.  A/P: Excessive sleepiness/drowsiness Patient is able to respond appropriately to questions and is easily awoken.Patient is currently on multiple psychiatric medications which can be contributing to this type of presentation, so it is hard to exclude this as a cause. Patient is also currently being treated for sepsis and is important to differentiate this from mental status changes overall. The patient is not obtunded at this time and is A&O x3. If there are further issues with mentation and patient has not had medications it could be more concerning for ingestion of another substance or mental status changes related to illness -Continue to watch patient's mental status -Continue to monitor vitals closely  UDS positive -Patient's recent UDS was positive for opiates, this is an expected result as patient was given fentanyl upon admission (at 1358 on 3/2).  Jozelyn Kuwahara, DO

## 2020-10-09 NOTE — Progress Notes (Signed)
PHARMACY - PHYSICIAN COMMUNICATION CRITICAL VALUE ALERT - BLOOD CULTURE IDENTIFICATION (BCID)  Susan [name] is an 45 y.o. female who presented to [facility] on 10/08/2020 with a chief complaint of abd pain  Assessment:  Pt with Ecoli (no resistance) bacteremia (suspected source urine)  Name of physician (or Provider) Contacted: Dr. Melba Coon  Current antibiotics: Rocephin 1gm IV q24h  Changes to prescribed antibiotics recommended:  Change Rocephin to 2gm IV q24h  Results for orders placed or performed during the hospital encounter of 10/08/20  Blood Culture ID Panel (Reflexed) (Collected: 10/08/2020 12:20 PM)  Result Value Ref Range   Enterococcus faecalis NOT DETECTED NOT DETECTED   Enterococcus Faecium NOT DETECTED NOT DETECTED   Listeria monocytogenes NOT DETECTED NOT DETECTED   Staphylococcus species NOT DETECTED NOT DETECTED   Staphylococcus aureus (BCID) NOT DETECTED NOT DETECTED   Staphylococcus epidermidis NOT DETECTED NOT DETECTED   Staphylococcus lugdunensis NOT DETECTED NOT DETECTED   Streptococcus species NOT DETECTED NOT DETECTED   Streptococcus agalactiae NOT DETECTED NOT DETECTED   Streptococcus pneumoniae NOT DETECTED NOT DETECTED   Streptococcus pyogenes NOT DETECTED NOT DETECTED   A.calcoaceticus-baumannii NOT DETECTED NOT DETECTED   Bacteroides fragilis NOT DETECTED NOT DETECTED   Enterobacterales DETECTED (A) NOT DETECTED   Enterobacter cloacae complex NOT DETECTED NOT DETECTED   Escherichia coli DETECTED (A) NOT DETECTED   Klebsiella aerogenes NOT DETECTED NOT DETECTED   Klebsiella oxytoca NOT DETECTED NOT DETECTED   Klebsiella pneumoniae NOT DETECTED NOT DETECTED   Proteus species NOT DETECTED NOT DETECTED   Salmonella species NOT DETECTED NOT DETECTED   Serratia marcescens NOT DETECTED NOT DETECTED   Haemophilus influenzae NOT DETECTED NOT DETECTED   Neisseria meningitidis NOT DETECTED NOT DETECTED   Pseudomonas aeruginosa NOT DETECTED NOT DETECTED    Stenotrophomonas maltophilia NOT DETECTED NOT DETECTED   Candida albicans NOT DETECTED NOT DETECTED   Candida auris NOT DETECTED NOT DETECTED   Candida glabrata NOT DETECTED NOT DETECTED   Candida krusei NOT DETECTED NOT DETECTED   Candida parapsilosis NOT DETECTED NOT DETECTED   Candida tropicalis NOT DETECTED NOT DETECTED   Cryptococcus neoformans/gattii NOT DETECTED NOT DETECTED   CTX-M ESBL NOT DETECTED NOT DETECTED   Carbapenem resistance IMP NOT DETECTED NOT DETECTED   Carbapenem resistance KPC NOT DETECTED NOT DETECTED   Carbapenem resistance NDM NOT DETECTED NOT DETECTED   Carbapenem resist OXA 48 LIKE NOT DETECTED NOT DETECTED   Carbapenem resistance VIM NOT DETECTED NOT DETECTED    Christoper Fabian, PharmD, BCPS Please see amion for complete clinical pharmacist phone list 10/09/2020  4:11 AM

## 2020-10-09 NOTE — Progress Notes (Signed)
Pt was no longer in pain, very happy, and talking about getting to go home today. This came within a short period of her visitor arriving. Notified Charge Nurse that something didn't seem quite right. She talked with our Director. They recommended calling the MD to get a urine drug screen, to see if the visitor had brought anything to give the patient.  Paged Dr. Clayborne Artist, urine drug screen was ordered and collected.

## 2020-10-09 NOTE — Progress Notes (Signed)
Pt difficult to arouse. Vitals stable. Pt in a deep sleep and did not respond verbally, opened eyes a few times to calling her name and touching her arm.

## 2020-10-09 NOTE — Progress Notes (Signed)
Interim Progress Note  S: In to see patient. She is standing up and eating dinner in room. States that pain has improved, now 5/10 (previoulsy 7/10). She is wondering if she will be able to leave soon. She is also concerned about being able to afford medications upon discharge as she does not have insurance. She is requesting financial assistance with this. She is in the process of establishing care with [facility].    O:  Blood pressure 130/77, pulse 89, temperature 98.6 F (37 C), temperature source Oral, resp. rate 18, last menstrual period 09/10/2020, SpO2 100 %, unknown if currently breastfeeding. Gen: Awake, alert, standing,  in no distress Cardio: RRR without murmur Lungs: CTAB without wheezing/rhonchi/rales, good air movement, intermittent dry cough  Back: No CVAT b/l  Ext: 2+ radial and DP pulses, no edema   A/P:  45 y/o F with PMHx cocaine use, tobacco use, depression with psychotic features, previous UTI's admitted for E. Coli bacteremia and pyelonephritis. She appears greatly improved compared to last night. She is in no distress and seen walking around without difficulty. No CVAT compared with yesterday's exam. Her vitals have also normalized, she is no longer tachycardic, febrile or hypotensive. Given her bacteremia, her abx have increased. Still awaiting susceptibilities to narrow treatment. Given improved appearance, anticipate she may be able to d/c soon and finish abx outpatient- will allow day team to discuss further. Will involve SW due to financial difficulties.  -Consult to [facility], re: financial assistance for medications, will need PCP follow up outpatient. Patient is in the process of establishing care at [address].  -Continue plan as previously outlined

## 2020-10-09 NOTE — Progress Notes (Signed)
Family Medicine Teaching Service Daily Progress Note Intern Pager: [phone]  Patient name: Susan [name] Medical record number: [id number] Date of birth: 1975/10/30 Age: 45 y.o. Gender: female  Primary Care Provider: Dartha Lodge, FNP Consultants: None Code Status: Full  Pt Overview and Major Events to Date:  3/2 - Admitted  Assessment and Plan: Susan [name] is a 45 y.o. female presenting with left flank pain . PMH is significant for UTIs, depression with psychotic features, tobacco use, prior cocaine use.   E. Coli sepsis  Pyelonephritis On admission, patient initially tachycardic, and hypotensive with LA of 3. Patient administered fluids and started on CTX. LA improved to 1.2. Blood pressures currently stable and patient non-tachycardic. Blood culture positive for E. Coli. Urine culture was collected after antibiotic initiation and a negative result would not change management. Day 2/7 for antibiotics, currently on CTX, will narrow as culture sensitivities result.  - Vitals per routine - Rocephin q24h (day 2/7) - Tylenol and Tramadol q8h - PT/OT  Vaginal discharge Patient reports hx of chlamydia and trich. Reports 1 month since last sexual activity, has been having yellow odorous discharge. Patient reports no vaginal pain.  - Urine Gc/Ch today - Consider swab for trichomoniasis if negative urine   Depression with psychotic features - Continue home luasidone, trileptal, buspar, lexapro - Currently holding hydroxyzine, can reconsider restarting if anxiety becomes a further concern  Constipation - Continue Miralax PRN  Substance use  UDS positive for cocaine, patient admits to having used cocaine "a few days ago". Known tobacco user of 2PPD.  - Continue Nicotine patch 21mg  daily  FEN/GI: Regular diet PPx: Lovenox   Status is: Inpatient  Remains inpatient appropriate because:Inpatient level of care appropriate due to severity of illness   Dispo: The patient is from:  Home              Anticipated d/c is to: Home              Patient currently is not medically stable to d/c.   Difficult to place patient No    Subjective:  Patient reports that she wants to make sure that she is not pregnant and wants to be checked for STIs; states she was last sexually active 1 month ago and that she has been having some yellowish discharge that she reports has an odor. She is also complaining of a headache when she coughs and persistent back pain.   Objective: Temp:  [97.5 F (36.4 C)-98.1 F (36.7 C)] 97.5 F (36.4 C) (03/03 0337) Pulse Rate:  [72-130] 72 (03/03 0337) Resp:  [15-18] 17 (03/03 0337) BP: (93-128)/(53-80) 107/64 (03/03 0337) SpO2:  [95 %-100 %] 98 % (03/03 0337) Physical Exam: General: well-nourished, NAD, supine on bed  Cardiovascular: RRR, no m/r/g appreciated Respiratory: CTAB, comfortable on room air, no increased WOB Abdomen: soft, diffuse tenderness, non-distended, no CVA tenderness on exam today Extremities: moving all equally and appropriately   Laboratory: Recent Labs  Lab 10/08/20 1010 10/09/20 0447  WBC 25.4* 17.3*  HGB 13.1 10.3*  HCT 38.3 30.1*  PLT 250 165   Recent Labs  Lab 10/08/20 1010 10/09/20 0447  NA 134* 136  K 3.9 3.5  CL 101 108  CO2 20* 18*  BUN 8 7  CREATININE 0.85 0.77  CALCIUM 9.1 8.4*  PROT 7.5  --   BILITOT 1.7*  --   ALKPHOS 104  --   ALT 39  --   AST 32  --   GLUCOSE  171* 217*    Imaging/Diagnostic Tests: CT ABDOMEN PELVIS W CONTRAST  Result Date: 10/08/2020 CLINICAL DATA:  Left flank pain, groin pain EXAM: CT ABDOMEN AND PELVIS WITH CONTRAST TECHNIQUE: Multidetector CT imaging of the abdomen and pelvis was performed using the standard protocol following bolus administration of intravenous contrast. CONTRAST:  OMNIPAQUE IOHEXOL 300 MG/ML  SOLN COMPARISON:  01/17/2020 FINDINGS: Lower chest: Dependent atelectasis in the lower lobes. Lingular atelectasis or scarring. No effusions. Heart is  normal size. Hepatobiliary: Diffuse low-density throughout the liver compatible with fatty infiltration. No focal abnormality. Gallbladder unremarkable. Pancreas: No focal abnormality or ductal dilatation. Spleen: No focal abnormality.  Normal size. Adrenals/Urinary Tract: Areas of decreased enhancement noted within the left kidney concerning for pyelonephritis. No hydronephrosis. No renal or ureteral stones. Mild left perinephric stranding. Adrenal glands and urinary bladder unremarkable. Stomach/Bowel: Stomach, large and small bowel grossly unremarkable. Normal appendix. Vascular/Lymphatic: No evidence of aneurysm or adenopathy. Reproductive: Left ovarian cyst measures up to 5.2 cm compared to 4.2 cm previously. Uterus and right ovary unremarkable. Other: No free fluid or free air. Musculoskeletal: No acute bony abnormality. IMPRESSION: Areas of decreased enhancement within the left kidney with surrounding perinephric stranding. Findings compatible with pyelonephritis. No hydronephrosis. Hepatic steatosis. 5.2 cm left ovarian cyst. Electronically Signed   By: Charlett Nose M.D.   On: 10/08/2020 15:02     Evelena Leyden, DO 10/09/2020, 11:56 AM PGY-1, [facility] FPTS Intern pager: [phone], text pages welcome

## 2020-10-09 NOTE — Progress Notes (Addendum)
1630 pt was in and out of sleep and said she needed to go to the bathroom. She wanted to sit on a toilet. She opened her eyes and talked long enough to stand and pivot to the [facility], but then kept falling asleep on the [facility] and I needed to keep rubbing her back and calling her name to arouse her enough to help stand and pivot back into bed.

## 2020-10-09 NOTE — Progress Notes (Signed)
Change in pt condition. Pt difficult to arouse. Unable to answer questions. Had Charge Nurse, Huston Foley, check on pt.  Urine drug screen today tested positive for opiates and cocaine (1215) Urine drug screen from yesterday tested positive for cocaine (2259)  Paged Dr. Clayborne Artist to notify of change in pt condition and results. She said she would come to see the patient.

## 2020-10-10 ENCOUNTER — Other Ambulatory Visit: Payer: Self-pay | Admitting: Family Medicine

## 2020-10-10 DIAGNOSIS — Z720 Tobacco use: Secondary | ICD-10-CM

## 2020-10-10 DIAGNOSIS — A4151 Sepsis due to Escherichia coli [E. coli]: Secondary | ICD-10-CM

## 2020-10-10 DIAGNOSIS — F1490 Cocaine use, unspecified, uncomplicated: Secondary | ICD-10-CM

## 2020-10-10 LAB — CBC
HCT: 30.4 % — ABNORMAL LOW (ref 36.0–46.0)
Hemoglobin: 9.9 g/dL — ABNORMAL LOW (ref 12.0–15.0)
MCH: 31.4 pg (ref 26.0–34.0)
MCHC: 32.6 g/dL (ref 30.0–36.0)
MCV: 96.5 fL (ref 80.0–100.0)
Platelets: 162 10*3/uL (ref 150–400)
RBC: 3.15 MIL/uL — ABNORMAL LOW (ref 3.87–5.11)
RDW: 13 % (ref 11.5–15.5)
WBC: 15.3 10*3/uL — ABNORMAL HIGH (ref 4.0–10.5)
nRBC: 0 % (ref 0.0–0.2)

## 2020-10-10 LAB — URINE CULTURE: Culture: NO GROWTH

## 2020-10-10 LAB — GC/CHLAMYDIA PROBE AMP (~~LOC~~) NOT AT ARMC
Chlamydia: NEGATIVE
Comment: NEGATIVE
Comment: NORMAL
Neisseria Gonorrhea: NEGATIVE

## 2020-10-10 LAB — BASIC METABOLIC PANEL
Anion gap: 10 (ref 5–15)
BUN: 6 mg/dL (ref 6–20)
CO2: 22 mmol/L (ref 22–32)
Calcium: 8.4 mg/dL — ABNORMAL LOW (ref 8.9–10.3)
Chloride: 104 mmol/L (ref 98–111)
Creatinine, Ser: 0.57 mg/dL (ref 0.44–1.00)
GFR, Estimated: >60 mL/min (ref >60)
Glucose, Bld: 155 mg/dL — ABNORMAL HIGH (ref 70–99)
Potassium: 3.6 mmol/L (ref 3.5–5.1)
Sodium: 136 mmol/L (ref 135–145)

## 2020-10-10 MED ORDER — CEFDINIR 300 MG PO CAPS: 300.0000 mg | ORAL_CAPSULE | Freq: Two times a day (BID) | ORAL | 0 refills | Status: AC

## 2020-10-10 MED FILL — CEFDINIR 300 MG CAPSULE: 300 | 5 days supply | Qty: 10 | Fill #0

## 2020-10-10 NOTE — Progress Notes (Signed)
Family Medicine Teaching Service Daily Progress Note Intern Pager: [phone]  Patient name: Susan [name] Medical record number: [id number] Date of birth: 07-23-1976 Age: 45 y.o. Gender: female  Primary Care Provider: Dartha Lodge, FNP Consultants: None Code Status: Full  Pt Overview and Major Events to Date:  3/2 - Admitted  Assessment and Plan: Susan [name] a 45 y.o.femalepresenting with left flank pain. PMH is significant for UTIs, depression with psychoticfeatures, tobacco use, prior cocaine use.  E. Coli sepsis  Pyelonephritis Day 3/7 for antibiotics, currently on CTX, ideally would narrow once culture sensitivities resulted, but at this time feel it is worth consideration to transition to oral cefdinir (300mg  to finish off 7 day course). Day 2/7 for antibiotic days (different from prior documentation as we have decided to count antibiotic days from treatment with the 2g CTX and the first day patient was only given 1g) - F/u CBC - Vitals per routine - Rocephin q24h (day 2/7 - see above for abx day clarification) - Tylenol and Tramadol q8h - PT/OT  Vaginal discharge, resolved Patient reports vaginal discharge is resolved - F/u urine Gc/Ch - Consider swab for trichomoniasis if patient concerned in the outpatient setting  Depression with psychotic features - Continue home luasidone, trileptal, buspar, lexapro - Currently holding hydroxyzine, can use PRN if necessary  Constipation - Continue Miralax PRN  Substance use  UDS on admission positive for cocaine. Hx of smoking. - Continue Nicotine patch 21mg  daily   FEN/GI: Regular diet PPx: Lovenox   Status is: Inpatient  Remains inpatient appropriate because:IV treatments appropriate due to intensity of illness or inability to take PO-if able to transition to oral and patient would be stable for discharge.   Dispo: The patient is from: Home              Anticipated d/c is to: Home              Patient  currently is not medically stable to d/c.   Difficult to place patient No   Subjective:  Patient reports that she feels great this morning.  She reports no further back pain, fevers, chills, headaches.  She thinks that she is ready to go home  Objective: Temp:  [98.5 F (36.9 C)-98.6 F (37 C)] 98.5 F (36.9 C) (03/04 0540) Pulse Rate:  [78-102] 78 (03/04 0540) Resp:  [13-18] 18 (03/04 0540) BP: (101-130)/(58-77) 118/74 (03/04 0540) SpO2:  [97 %-100 %] 98 % (03/04 0540) Physical Exam: General: NAD, sitting up in chair Cardiovascular: RRR, no M/G appreciated Respiratory: Comfortable on room air, no increased WOB, CTAB  Abdomen: Soft, nontender, nondistended, negative CVA Extremities: Moving all extremities equally and appropriately, PIV in right arm  Laboratory: Recent Labs  Lab 10/08/20 1010 10/09/20 0447  WBC 25.4* 17.3*  HGB 13.1 10.3*  HCT 38.3 30.1*  PLT 250 165   Recent Labs  Lab 10/08/20 1010 10/09/20 0447  NA 134* 136  K 3.9 3.5  CL 101 108  CO2 20* 18*  BUN 8 7  CREATININE 0.85 0.77  CALCIUM 9.1 8.4*  PROT 7.5  --   BILITOT 1.7*  --   ALKPHOS 104  --   ALT 39  --   AST 32  --   GLUCOSE 171* 217*   Blood culture ID panel: detected E. Coli.  Imaging/Diagnostic Tests: No results found.   12/08/20, DO 10/10/2020, 9:22 AM PGY-1, [facility] Family Medicine FPTS Intern pager: [phone], text pages welcome

## 2020-10-10 NOTE — Progress Notes (Signed)
Called pt to let her know that she is being discharged now. She will follow up with her new PCP but she does not establish with them I told her that she should establish with another family medicine practice. The urine GC chlamydia is pending and we will contact about it if it results as positive. Pt expressed understanding and is happy with the plan.  Towanda Octave MD PGY-2, [facility]

## 2020-10-10 NOTE — Discharge Summary (Signed)
Family Medicine Teaching [facility] Discharge Summary  Patient name: Susan [name] Medical record number: [id number] Date of birth: November 11, 1975 Age: 45 y.o. Gender: female Date of Admission: 10/08/2020  Date of Discharge: 10/10/2020  Admitting Physician: Nestor Ramp, MD  Primary Care Provider: Dartha Lodge, FNP Consultants: None  Indication for Hospitalization: Pyelonephritis   Discharge Diagnoses/Problem List:  Patient Active Problem List   Diagnosis Date Noted  . Tobacco use   . Cocaine use   . Sepsis due to Escherichia coli without acute organ dysfunction (HCC)   . Pyelonephritis 10/08/2020  . Polysubstance dependence (HCC) 07/27/2019  . MDD (major depressive disorder), recurrent episode, severe (HCC) 01/23/2019  . PTSD (post-traumatic stress disorder) 06/07/2017  . Substance induced mood disorder (HCC) 06/04/2017  . Preterm labor 04/10/2017  . Asymptomatic bacteriuria during pregnancy in second trimester 03/14/2017  . Late prenatal care in second trimester 03/08/2017  . Supervision of normal pregnancy 03/08/2017  . MDD (major depressive disorder), single episode, severe with psychotic features (HCC) 01/05/2017  . Alcohol use disorder, severe, dependence (HCC) 01/05/2017  . Cocaine use disorder, moderate, dependence (HCC) 01/05/2017  . Cannabis use disorder, moderate, dependence (HCC) 01/05/2017  . Heavy smoker 02/03/2016  . Cocaine abuse affecting pregnancy, antepartum (HCC) 02/03/2016  . Chronic, continuous use of opioids 03/24/2015  . Depression with anxiety 03/24/2015  . Poor dentition 03/24/2015  . Sprain of ankle, unspecified site 05/28/2009    Disposition: Home  Discharge Condition: Stable, improved  Discharge Exam:  General: NAD, sitting up in chair Cardiovascular: RRR, no M/G appreciated Respiratory: Comfortable on room air, no increased WOB, CTAB  Abdomen: Soft, nontender, nondistended, negative CVA Extremities: Moving all extremities equally and  appropriately, PIV in right arm  Brief Hospital Course:  Susan [name] is a 45 y.o. female who presented with left flank pain, found to have pyelonephritis. PMH is significant for UTIs, depression with psychotic features, tobacco use, prior cocaine use. Below is her hospital course, refer to the H&P for additional information.   Pyelonephritis  In ED, patient tachycardic to 130's, hypotensive to 90's/80's. Lactic acid 3. Received 2L NA boluses. WBC 25.4. CT consistent with pyelonephritis, negative for renal stone. Patient started on Ceftriaxone. Initial pain control in ED with 4 mg Morphine x1, 50 mcg Fentanyl x1 and Tyelnol x1. On admission, pain control with scheduled Tylenol and Toradol PRN. Additional NS bolus given on admission given consistently soft BP's. Lactic acid trended and improved to 1.5. Urine culture did not grow anything, but was collected after antibiotics were initiated. Blood culture grew E. Coli. Patient was transitioned to cefdinir upon discharge to finish out 7 total antibiotic days.    Depression with Psychotic Features  Hx of previous suicide attempt. Previously had been on medications but reported being off of them for the past year. Patient noted anxiety on admission and requested to restart her psych medication. Trileptal, Buspar, Lexapro and Lurasidone were restarted. Hydroxyzine was held.  Polysubstance Abuse History of previous cocaine and alcohol abuse. UDS on admission showed cocaine positive. Second UDS obtained was positive, though patient had received fentanyl in the ED for pain management.    All other chronic conditions remained stable during admission and treated with home medications as appropriate.   Issues for follow-up: 1. PCP follow-up outpatient to monitor for improvement of symptoms and completion of antibiotic therapy. 2. Consider evaluation of psychiatric medications, patient was sleepy but easily arousable with administration of all  medications. 3. PCP follow-up for monitoring of Hgb,  consideration for iron studies/further investigation outpatient.    Significant Procedures: None  Significant Labs and Imaging:  Recent Labs  Lab 10/08/20 1010 10/09/20 0447 10/10/20 1100  WBC 25.4* 17.3* 15.3*  HGB 13.1 10.3* 9.9*  HCT 38.3 30.1* 30.4*  PLT 250 165 162   Recent Labs  Lab 10/08/20 1010 10/09/20 0447 10/10/20 1100  NA 134* 136 136  K 3.9 3.5 3.6  CL 101 108 104  CO2 20* 18* 22  GLUCOSE 171* 217* 155*  BUN 8 7 6   CREATININE 0.85 0.77 0.57  CALCIUM 9.1 8.4* 8.4*  ALKPHOS 104  --   --   AST 32  --   --   ALT 39  --   --   ALBUMIN 3.3*  --   --     CT ABDOMEN PELVIS W CONTRAST  Result Date: 10/08/2020 CLINICAL DATA:  Left flank pain, groin pain EXAM: CT ABDOMEN AND PELVIS WITH CONTRAST TECHNIQUE: Multidetector CT imaging of the abdomen and pelvis was performed using the standard protocol following bolus administration of intravenous contrast. CONTRAST:  12/08/2020 OMNIPAQUE IOHEXOL 300 MG/ML  SOLN COMPARISON:  01/17/2020 FINDINGS: Lower chest: Dependent atelectasis in the lower lobes. Lingular atelectasis or scarring. No effusions. Heart is normal size. Hepatobiliary: Diffuse low-density throughout the liver compatible with fatty infiltration. No focal abnormality. Gallbladder unremarkable. Pancreas: No focal abnormality or ductal dilatation. Spleen: No focal abnormality.  Normal size. Adrenals/Urinary Tract: Areas of decreased enhancement noted within the left kidney concerning for pyelonephritis. No hydronephrosis. No renal or ureteral stones. Mild left perinephric stranding. Adrenal glands and urinary bladder unremarkable. Stomach/Bowel: Stomach, large and small bowel grossly unremarkable. Normal appendix. Vascular/Lymphatic: No evidence of aneurysm or adenopathy. Reproductive: Left ovarian cyst measures up to 5.2 cm compared to 4.2 cm previously. Uterus and right ovary unremarkable. Other: No free fluid or free air.  Musculoskeletal: No acute bony abnormality. IMPRESSION: Areas of decreased enhancement within the left kidney with surrounding perinephric stranding. Findings compatible with pyelonephritis. No hydronephrosis. Hepatic steatosis. 5.2 cm left ovarian cyst. Electronically Signed   By: 03/18/2020 M.D.   On: 10/08/2020 15:02   DG Chest Portable 1 View  Result Date: 10/08/2020 CLINICAL DATA:  Cough EXAM: PORTABLE CHEST 1 VIEW COMPARISON:  09/06/2016 FINDINGS: Bibasilar opacities likely reflect atelectasis. Heart is upper limits normal in size. No effusions or acute bony abnormality. IMPRESSION: Bibasilar atelectasis. Electronically Signed   By: 09/08/2016 M.D.   On: 10/08/2020 11:11    Results/Tests Pending at Time of Discharge: Urine Gc/Ch  Discharge Medications:  Allergies as of 10/10/2020   No Known Allergies     Medication List    STOP taking these medications   metroNIDAZOLE 500 MG tablet Commonly known as: FLAGYL   promethazine 25 MG tablet Commonly known as: PHENERGAN     TAKE these medications   acyclovir ointment 5 % Commonly known as: ZOVIRAX Apply topically 4 (four) times daily. To lip   busPIRone 10 MG tablet Commonly known as: BUSPAR Take 1 tablet (10 mg total) by mouth 3 (three) times daily.   cefdinir 300 MG capsule Commonly known as: OMNICEF Take 1 capsule (300 mg total) by mouth 2 (two) times daily for 5 days. Start taking on: October 11, 2020   cyclobenzaprine 10 MG tablet Commonly known as: FLEXERIL Take 1 tablet (10 mg total) by mouth 2 (two) times daily as needed for muscle spasms.   escitalopram 10 MG tablet Commonly known as: Lexapro Take 1 tablet (10 mg  total) by mouth daily.   gabapentin 300 MG capsule Commonly known as: NEURONTIN Take 1 capsule (300 mg total) by mouth 3 (three) times daily.   hydrocerin Crea Apply 1 application topically 3 (three) times daily as needed (eczema).   hydrOXYzine 50 MG tablet Commonly known as: ATARAX/VISTARIL Take  1 tablet (50 mg total) by mouth 3 (three) times daily.   ibuprofen 200 MG tablet Commonly known as: ADVIL Take 400 mg by mouth every 6 (six) hours as needed for mild pain.   Lurasidone HCl 60 MG Tabs Take 1 tablet (60 mg total) by mouth daily with breakfast.   neomycin-bacitracin-polymyxin Oint Commonly known as: NEOSPORIN Apply 1 application topically as needed for irritation.   nicotine 21 mg/24hr patch Commonly known as: NICODERM CQ - dosed in mg/24 hours Place 1 patch (21 mg total) onto the skin daily.   Oxcarbazepine 300 MG tablet Commonly known as: Trileptal Take 1 tablet (300 mg total) by mouth in the morning, at noon, and at bedtime.   pantoprazole 20 MG tablet Commonly known as: PROTONIX Take 2 tablets (40 mg total) by mouth daily for 14 days.       Discharge Instructions: Please refer to Patient Instructions section of EMR for full details.  Patient was counseled important signs and symptoms that should prompt return to medical care, changes in medications, dietary instructions, activity restrictions, and follow up appointments.   Follow-Up Appointments:  Follow-up Information    Dartha Lodge, FNP. Schedule an appointment as soon as possible for a visit in 3 day(s).   Specialty: Nurse Practitioner Why: Please make an appointment in the next 3-5 days for a hospital follow-up. Contact information: [address] [place] [address] [phone]        [facility] Follow up.   Why: November 11, 2020 at 0930  Contact information: [address] [place] [address] [phone]              Evelena Leyden, DO 10/10/2020, 12:12 PM PGY-1, [facility]

## 2020-10-10 NOTE — Progress Notes (Signed)
[name] to be D/C'd per MD order. Discussed with the patient and all questions fully answered. ? VSS, Skin clean, dry and intact without evidence of skin break down, no evidence of skin tears noted. ? IV catheter discontinued intact. Site without signs and symptoms of complications. Dressing and pressure applied. ? An After Visit Summary was printed and given to the patient. Patient informed where to pickup prescriptions. ? D/C education completed with patient/family including follow up instructions, medication list, d/c activities limitations if indicated, with other d/c instructions as indicated by MD - patient able to verbalize understanding, all questions fully answered.  ? Patient instructed to return to ED, call 911, or call MD for any changes in condition.  ? Patient to be escorted via WC, and D/C home via private auto.

## 2020-10-10 NOTE — Discharge Instructions (Signed)
You are hospitalized due to a kidney infection that spread to your bloodstream.  You were started on IV antibiotics and transitioned to an oral medication called cefdinir.  You will continue this medication for the next 5 days.  It is very important that you follow-up with your primary care physician to ensure no worsening and that you are having full resolution of your symptoms and the infection is going away.         Pyelonephritis, Adult  Pyelonephritis is an infection that occurs in the kidney. The kidneys are organs that help clean the blood by moving waste out of the blood and into the pee (urine). This infection can happen quickly, or it can last for a long time. In most cases, it clears up with treatment and does not cause other problems. What are the causes? This condition may be caused by:  Germs (bacteria) going from the bladder up to the kidney. This may happen after having a bladder infection.  Germs going from the blood to the kidney. What increases the risk? This condition is more likely to develop in:  Pregnant women.  Older people.  People who have any of these conditions: ? Diabetes. ? Inflammation of the prostate gland (prostatitis), in males. ? Kidney stones or bladder stones. ? Other problems with the kidney or the parts of your body that carry pee from the kidneys to the bladder (ureters). ? Cancer.  People who have a small, thin tube (catheter) placed in the bladder.  People who are sexually active.  Women who use a medicine that kills sperm (spermicide) to prevent pregnancy.  People who have had a prior urinary tract infection (UTI). What are the signs or symptoms? Symptoms of this condition include:  Peeing often.  A strong urge to pee right away.  Burning or stinging when peeing.  Belly pain.  Back pain.  Pain in the side (flank area).  Fever or chills.  Blood in the pee, or dark pee.  Feeling sick to your stomach (nauseous) or  throwing up (vomiting). How is this treated? This condition may be treated by:  Taking antibiotic medicines by mouth (orally).  Drinking enough fluids. If the infection is bad, you may need to stay in the hospital. You may be given antibiotics and fluids that are put directly into a vein through an IV tube. In some cases, other treatments may be needed. Follow these instructions at home: Medicines  Take your antibiotic medicine as told by your doctor. Do not stop taking the antibiotic even if you start to feel better.  Take over-the-counter and prescription medicines only as told by your doctor. General instructions  Drink enough fluid to keep your pee pale yellow.  Avoid caffeine, tea, and carbonated drinks.  Pee (urinate) often. Avoid holding in pee for long periods of time.  Pee before and after sex.  After pooping (having a bowel movement), women should wipe from front to back. Use each tissue only once.  Keep all follow-up visits as told by your doctor. This is important.   Contact a doctor if:  You do not feel better after 2 days.  Your symptoms get worse.  You have a fever. Get help right away if:  You cannot take your medicine or drink fluids as told.  You have chills and shaking.  You throw up.  You have very bad pain in your side or back.  You feel very weak or you pass out (faint). Summary  Pyelonephritis is  an infection that occurs in the kidney.  In most cases, this infection clears up with treatment and does not cause other problems.  Take your antibiotic medicine as told by your doctor. Do not stop taking the antibiotic even if you start to feel better.  Drink enough fluid to keep your pee pale yellow. This information is not intended to replace advice given to you by your health care provider. Make sure you discuss any questions you have with your health care provider. Document Revised: 05/30/2018 Document Reviewed: 05/30/2018 Elsevier Patient  Education  2021 [employer].

## 2020-10-10 NOTE — TOC Initial Note (Signed)
Transition of Care [facility]) - Initial/Assessment Note    Patient Details  Name: Susan [name] MRN: [id number] Date of Birth: May 09, 1976  Transition of Care [facility]) CM/SW Contact:    Kingsley Plan, RN Phone Number: 10/10/2020, 12:28 PM  Clinical Narrative:                 Prescription sent to [facility].   Cost is $6. Patient can afford.   Patient is setting up PCP services with Dr Dartha Lodge at [facility]. Phone [phone] fax [phone].   NCM called spoke to KIM. Dr Christin Fudge requires the "entire chart " to be fax to the office, then he reviews chart if agreeable ,  his staff will call to arrange an appointment.   NCM explained above to patient she voiced understanding and sign a release of information form. Chart faxed.   NCM arranged an appointment at [facility]. If Dr Eldridge Scot office arranges an appointment patient will cancel [facility] appontment.    Expected Discharge Plan: Home/Self Care Barriers to Discharge: No Barriers Identified   Patient Goals and CMS Choice Patient states their goals for this hospitalization and ongoing recovery are:: to return to home CMS Medicare.gov Compare Post Acute Care list provided to:: Patient    Expected Discharge Plan and Services Expected Discharge Plan: Home/Self Care   Discharge Planning Services: CM Consult   Living arrangements for the past 2 months: Single Family Home Expected Discharge Date: 10/10/20                 DME Agency: NA (does not want 3 in 1)       HH Arranged: NA          Prior Living Arrangements/Services Living arrangements for the past 2 months: Single Family Home Lives with:: Spouse Patient language and need for interpreter reviewed:: Yes        Need for Family Participation in Patient Care: Yes (Comment) Care giver support system in place?: Yes (comment)   Criminal Activity/Legal Involvement Pertinent to Current Situation/Hospitalization: No - Comment as needed  Activities of  Daily Living Home Assistive Devices/Equipment: None ADL Screening (condition at time of admission) Patient's cognitive ability adequate to safely complete daily activities?: No Is the patient deaf or have difficulty hearing?: No Does the patient have difficulty seeing, even when wearing glasses/contacts?: No Does the patient have difficulty concentrating, remembering, or making decisions?: No Patient able to express need for assistance with ADLs?: No Does the patient have difficulty dressing or bathing?: No Independently performs ADLs?: No Communication: Independent Does the patient have difficulty walking or climbing stairs?: No Weakness of Legs: None Weakness of Arms/Hands: None  Permission Sought/Granted   Permission granted to share information with : No              Emotional Assessment Appearance:: Appears stated age Attitude/Demeanor/Rapport: Engaged Affect (typically observed): Accepting Orientation: : Oriented to Situation,Oriented to  Time,Oriented to Place,Oriented to Self      Admission diagnosis:  Pyelonephritis [N12] Patient Active Problem List   Diagnosis Date Noted  . Tobacco use   . Cocaine use   . Sepsis due to Escherichia coli without acute organ dysfunction (HCC)   . Pyelonephritis 10/08/2020  . Polysubstance dependence (HCC) 07/27/2019  . MDD (major depressive disorder), recurrent episode, severe (HCC) 01/23/2019  . PTSD (post-traumatic stress disorder) 06/07/2017  . Substance induced mood disorder (HCC) 06/04/2017  . Preterm labor 04/10/2017  . Asymptomatic bacteriuria  during pregnancy in second trimester 03/14/2017  . Late prenatal care in second trimester 03/08/2017  . Supervision of normal pregnancy 03/08/2017  . MDD (major depressive disorder), single episode, severe with psychotic features (HCC) 01/05/2017  . Alcohol use disorder, severe, dependence (HCC) 01/05/2017  . Cocaine use disorder, moderate, dependence (HCC) 01/05/2017  . Cannabis  use disorder, moderate, dependence (HCC) 01/05/2017  . Heavy smoker 02/03/2016  . Cocaine abuse affecting pregnancy, antepartum (HCC) 02/03/2016  . Chronic, continuous use of opioids 03/24/2015  . Depression with anxiety 03/24/2015  . Poor dentition 03/24/2015  . Sprain of ankle, unspecified site 05/28/2009   PCP:  Dartha Lodge, FNP Pharmacy:   [facility] [address] [place] - [address] [place] [address] Phone: [phone] Fax: [phone]  [facility] [place], [place] - [address], [place] [address], [place] [address] [place] [address] Phone: [phone] Fax: [phone]  [facility] [place], [place] - [address] [place] [address] Phone: [phone] Fax: [phone]  [facility] [place], [place] - [address] [place] [address] Phone: [phone] Fax: [phone]     Social Determinants of Health (SDOH) Interventions    Readmission Risk Interventions No flowsheet data found.

## 2020-10-11 LAB — CULTURE, BLOOD (ROUTINE X 2): Special Requests: ADEQUATE

## 2020-10-13 ENCOUNTER — Encounter: Payer: Self-pay | Admitting: Family Medicine

## 2020-10-13 ENCOUNTER — Telehealth: Payer: Self-pay | Admitting: Family Medicine

## 2020-10-13 LAB — CULTURE, BLOOD (ROUTINE X 2)
Culture: NO GROWTH
Special Requests: ADEQUATE

## 2020-10-13 NOTE — Telephone Encounter (Signed)
Attempted to contact patient regarding recent results from hospital admission. GC/CT was negative. Will send MyChart message.  Shirlean Mylar, MD [facility] Family Medicine Residency, PGY-2

## 2020-10-15 ENCOUNTER — Other Ambulatory Visit: Payer: Self-pay | Admitting: Family Medicine

## 2020-11-05 ENCOUNTER — Telehealth: Payer: Self-pay

## 2020-11-05 NOTE — Telephone Encounter (Signed)
Provider Zelda working virtual 4/5. Called Pt to let her know appt would be virtual. No answer and vm full. If patient call back make aware appt is virtual and if in person is preferred call [phone] to reschedule

## 2020-11-11 ENCOUNTER — Telehealth: Payer: Self-pay | Admitting: Nurse Practitioner

## 2020-11-11 ENCOUNTER — Other Ambulatory Visit: Payer: Self-pay

## 2020-11-11 ENCOUNTER — Ambulatory Visit: Payer: Self-pay | Attending: Nurse Practitioner | Admitting: Nurse Practitioner

## 2020-11-11 NOTE — Telephone Encounter (Signed)
Susan [name] is already established with a PCP at the [facility] office. Does not wish to establish care with [facility]

## 2020-12-25 ENCOUNTER — Other Ambulatory Visit: Payer: Self-pay

## 2020-12-25 ENCOUNTER — Telehealth (HOSPITAL_COMMUNITY): Payer: Federal, State, Local not specified - Other | Admitting: Psychiatry

## 2021-01-14 ENCOUNTER — Other Ambulatory Visit: Payer: Self-pay

## 2021-01-14 ENCOUNTER — Emergency Department (HOSPITAL_COMMUNITY)
Admission: EM | Admit: 2021-01-14 | Discharge: 2021-01-14 | Disposition: A | Discharge status: Home / Self Care | Payer: Medicaid Other | Attending: Emergency Medicine | Admitting: Emergency Medicine

## 2021-01-14 ENCOUNTER — Other Ambulatory Visit (HOSPITAL_COMMUNITY): Payer: Self-pay

## 2021-01-14 ENCOUNTER — Encounter (HOSPITAL_COMMUNITY): Payer: Self-pay

## 2021-01-14 DIAGNOSIS — J45909 Unspecified asthma, uncomplicated: Secondary | ICD-10-CM | POA: Insufficient documentation

## 2021-01-14 DIAGNOSIS — F17210 Nicotine dependence, cigarettes, uncomplicated: Secondary | ICD-10-CM | POA: Insufficient documentation

## 2021-01-14 DIAGNOSIS — L255 Unspecified contact dermatitis due to plants, except food: Secondary | ICD-10-CM | POA: Insufficient documentation

## 2021-01-14 MED ORDER — PREDNISONE 10 MG (21) PO TBPK
ORAL_TABLET | Freq: Every day | ORAL | 0 refills | Status: DC
  Filled 2021-01-14: qty 21, 6d supply, fill #0

## 2021-01-14 NOTE — ED Triage Notes (Signed)
Patient complains of poison ivy x 4 days. States that she is using caladryl lotion with no relief. Patient in NAD

## 2021-01-14 NOTE — Discharge Instructions (Addendum)
You were seen in the emergency department for a rash.  This is likely poison ivy or some other kind of contact dermatitis.  You can continue to use the calamine lotion.  We are prescribing you a steroid taper.  This should start improving in 1 to 2 days.  Return to the emergency department if any worsening or concerning symptoms

## 2021-01-14 NOTE — ED Provider Notes (Signed)
[facility] EMERGENCY DEPARTMENT Provider Note   CSN: [id number] Arrival date & time: 01/14/21  5102     History No chief complaint on file.   Susan [name] is a 45 y.o. female.  She is here with a complaint of an itchy rash to her face arms and legs after working outside.  Its been going on for 4 days.  She has been trying calamine lotion without any improvement.  She also states she does not have any money to go by any medications.  No shortness of breath fevers chills nausea vomiting.  The history is provided by the patient.  Rash Location:  Face, shoulder/arm and leg Facial rash location:  Forehead Shoulder/arm rash location:  L arm and R arm Leg rash location:  L leg and R leg Quality: itchiness, painful, redness and weeping   Pain details:    Quality:  Itching   Severity:  Moderate   Onset quality:  Gradual   Timing:  Constant   Progression:  Worsening Severity:  Moderate Timing:  Constant Progression:  Spreading Chronicity:  New Context: plant contact   Relieved by:  Nothing Worsened by:  Nothing Ineffective treatments:  OTC analgesics Associated symptoms: no abdominal pain, no fever, no nausea, no shortness of breath and not vomiting        Past Medical History:  Diagnosis Date  . Alcohol abuse   . Asthma   . BV (bacterial vaginosis) 03/06/2013  . Chronic dental pain   . Chronic pelvic pain in female   . Depression   . Ectopic pregnancy   . GERD (gastroesophageal reflux disease)   . Panic attacks   . Polysubstance abuse (HCC)    cocaine, opiates, marijuana  . Trichomonas vaginitis     Patient Active Problem List   Diagnosis Date Noted  . Tobacco use   . Cocaine use   . Sepsis due to Escherichia coli without acute organ dysfunction (HCC)   . Pyelonephritis 10/08/2020  . Polysubstance dependence (HCC) 07/27/2019  . MDD (major depressive disorder), recurrent episode, severe (HCC) 01/23/2019  . PTSD (post-traumatic stress disorder)  06/07/2017  . Substance induced mood disorder (HCC) 06/04/2017  . Preterm labor 04/10/2017  . Asymptomatic bacteriuria during pregnancy in second trimester 03/14/2017  . Late prenatal care in second trimester 03/08/2017  . Supervision of normal pregnancy 03/08/2017  . MDD (major depressive disorder), single episode, severe with psychotic features (HCC) 01/05/2017  . Alcohol use disorder, severe, dependence (HCC) 01/05/2017  . Cocaine use disorder, moderate, dependence (HCC) 01/05/2017  . Cannabis use disorder, moderate, dependence (HCC) 01/05/2017  . Heavy smoker 02/03/2016  . Cocaine abuse affecting pregnancy, antepartum (HCC) 02/03/2016  . Chronic, continuous use of opioids 03/24/2015  . Depression with anxiety 03/24/2015  . Poor dentition 03/24/2015  . Sprain of ankle, unspecified site 05/28/2009    Past Surgical History:  Procedure Laterality Date  . ECTOPIC PREGNANCY SURGERY       OB History    Gravida  9   Para  5   Term  4   Preterm  1   AB  4   Living  5     SAB  1   IAB  2   Ectopic  1   Multiple  0   Live Births  5           Family History  Problem Relation Age of Onset  . COPD Mother   . Stroke Mother   . Heart  disease Maternal Grandmother   . Hypertension Maternal Grandmother   . Stroke Maternal Grandfather   . Asthma Sister   . Down syndrome Sister     Social History   Tobacco Use  . Smoking status: Current Every Day Smoker    Packs/day: 1.00    Years: 26.00    Pack years: 26.00    Types: Cigarettes  . Smokeless tobacco: Never Used  Vaping Use  . Vaping Use: Never used  Substance Use Topics  . Alcohol use: Yes    Alcohol/week: 3.0 - 4.0 standard drinks    Types: 3 - 4 Cans of beer per week    Comment: drinks about a 40oz about once a week.   . Drug use: Yes    Types: Marijuana, Cocaine    Home Medications Prior to Admission medications   Medication Sig Start Date End Date Taking? Authorizing Provider  acyclovir  ointment (ZOVIRAX) 5 % Apply topically 4 (four) times daily. To lip Patient not taking: No sig reported 07/30/19   Aldean Baker, NP  busPIRone (BUSPAR) 10 MG tablet Take 1 tablet (10 mg total) by mouth 3 (three) times daily. Patient not taking: No sig reported 10/02/20   Toy Cookey E, NP  cefdinir (OMNICEF) 300 MG capsule TAKE 1 CAPSULE (300 MG TOTAL) BY MOUTH TWO TIMES DAILY FOR 5 DAYS. 10/10/20 10/10/21  Lilland, Percival Spanish, DO  cyclobenzaprine (FLEXERIL) 10 MG tablet Take 1 tablet (10 mg total) by mouth 2 (two) times daily as needed for muscle spasms. Patient not taking: No sig reported 09/06/20   Namon Cirri E, PA-C  escitalopram (LEXAPRO) 10 MG tablet Take 1 tablet (10 mg total) by mouth daily. Patient not taking: No sig reported 10/02/20   Toy Cookey E, NP  gabapentin (NEURONTIN) 300 MG capsule Take 1 capsule (300 mg total) by mouth 3 (three) times daily. Patient not taking: No sig reported 10/02/20   Shanna Cisco, NP  hydrocerin (EUCERIN) CREA Apply 1 application topically 3 (three) times daily as needed (eczema). Patient not taking: No sig reported 07/30/19   Aldean Baker, NP  hydrOXYzine (ATARAX/VISTARIL) 50 MG tablet Take 1 tablet (50 mg total) by mouth 3 (three) times daily. Patient not taking: No sig reported 10/02/20   Toy Cookey E, NP  ibuprofen (ADVIL) 200 MG tablet Take 400 mg by mouth every 6 (six) hours as needed for mild pain.    [provider]  lurasidone 60 MG TABS Take 1 tablet (60 mg total) by mouth daily with breakfast. Patient not taking: No sig reported 10/02/20   Shanna Cisco, NP  neomycin-bacitracin-polymyxin (NEOSPORIN) OINT Apply 1 application topically as needed for irritation. Patient not taking: No sig reported 07/30/19   Aldean Baker, NP  nicotine (NICODERM CQ - DOSED IN MG/24 HOURS) 21 mg/24hr patch Place 1 patch (21 mg total) onto the skin daily. Patient not taking: No sig reported 10/02/20   Shanna Cisco, NP   Oxcarbazepine (TRILEPTAL) 300 MG tablet Take 1 tablet (300 mg total) by mouth in the morning, at noon, and at bedtime. Patient not taking: No sig reported 10/02/20   Toy Cookey E, NP  pantoprazole (PROTONIX) 20 MG tablet Take 2 tablets (40 mg total) by mouth daily for 14 days. Patient not taking: Reported on 10/08/2020 01/17/20 01/31/20  Alvira Monday, MD    Allergies    Patient has no known allergies.  Review of Systems   Review of Systems  Constitutional: Negative for fever.  Respiratory: Negative for shortness of breath.   Gastrointestinal: Negative for abdominal pain, nausea and vomiting.  Skin: Positive for rash.    Physical Exam Updated Vital Signs BP 120/81 (BP Location: Left Arm)   Pulse 66   Temp 98.6 F (37 C) (Oral)   Resp 18   SpO2 100%   Physical Exam Vitals and nursing note reviewed.  Constitutional:      Appearance: Normal appearance. She is well-developed.  HENT:     Head: Normocephalic and atraumatic.  Eyes:     Conjunctiva/sclera: Conjunctivae normal.  Cardiovascular:     Rate and Rhythm: Normal rate and regular rhythm.  Pulmonary:     Effort: Pulmonary effort is normal.     Breath sounds: Normal breath sounds.  Musculoskeletal:     Cervical back: Neck supple.  Skin:    General: Skin is warm and dry.     Findings: Rash present.     Comments: She has multiple areas of raised lesions over her face arms and legs consistent with contact dermatitis.  Neurological:     General: No focal deficit present.     Mental Status: She is alert.     GCS: GCS eye subscore is 4. GCS verbal subscore is 5. GCS motor subscore is 6.     ED Results / Procedures / Treatments   Labs (all labs ordered are listed, but only abnormal results are displayed) Labs Reviewed - No data to display  EKG None  Radiology No results found.  Procedures Procedures   Medications Ordered in ED Medications - No data to display  ED Course  I have reviewed the triage  vital signs and the nursing notes.  Pertinent labs & imaging results that were available during my care of the patient were reviewed by me and considered in my medical decision making (see chart for details).    MDM Rules/Calculators/A&P                         45 year old female presenting with itchy rash over her face arms and legs.  Had been working outside.  Exam consistent with contact dermatitis likely poison ivy or something like that.  Not a diabetic so we will cover with a course of steroids.  Return instructions discussed.  She has a lack of findings to require her medicine.  Transitions of care was consulted and they were able to arrange medication to be given to patient.  Final Clinical Impression(s) / ED Diagnoses Final diagnoses:  Contact dermatitis due to plants, except food, unspecified contact dermatitis type    Rx / DC Orders ED Discharge Orders         Ordered    predniSONE (STERAPRED UNI-PAK 21 TAB) 10 MG (21) TBPK tablet  Daily        01/14/21 1233           Terrilee Files, MD 01/14/21 2051

## 2021-01-14 NOTE — Discharge Planning (Signed)
RNCM consulted for medication assistance. RNCM reviewed chart and spoke with the pt about [facility] program ($3 co pay for each Rx through MATCH program, does not include refills, 7 day expiration of MATCH letter and choice of pharmacies). Pt is eligible for [facility] program (unable to find pt listed in [facility] per cardholder name inquiry) and has agreed to accept MATCH with co-pay override. [facility] information entered; [facility] to fill and deliver to pt at bedside prior to discharge home today. RNCM updated EDP and ED RN.     

## 2021-01-14 NOTE — ED Provider Notes (Signed)
Emergency Medicine Provider Triage Evaluation Note  Susan [name] , a 45 y.o. female  was evaluated in triage.  Pt complains of poison ivy rash to bilateral arms and legs for the past 4 days after working outside. Only washed her bedding and sheets today however states the rash has been spreading. She has been applying calamine lotion to the rash without relief of the itching. Has not tried anything else as she doesn't have the financial means to go buy medications. Requesting help from social worker if she needs prescriptions. No SOB.  Review of Systems  Positive: + rash Negative: - SOB  Physical Exam  BP 120/81 (BP Location: Left Arm)   Pulse 66   Temp 98.6 F (37 C) (Oral)   Resp 18   SpO2 100%  Gen:   Awake, no distress   Resp:  Normal effort  MSK:   Moves extremities without difficulty  Other:  Rash to bilateral arms and legs consistent with likely poison ivy contact  Medical Decision Making  Medically screening exam initiated at 9:31 AM.  Appropriate orders placed.  Susan [name] was informed that the remainder of the evaluation will be completed by another provider, this initial triage assessment does not replace that evaluation, and the importance of remaining in the ED until their evaluation is complete.     Tanda Rockers, PA-C 01/14/21 9407    Cathren Laine, MD 01/16/21 [phone]

## 2021-04-10 ENCOUNTER — Other Ambulatory Visit: Payer: Self-pay

## 2021-04-10 ENCOUNTER — Emergency Department (HOSPITAL_COMMUNITY)
Admission: EM | Admit: 2021-04-10 | Discharge: 2021-04-10 | Disposition: A | Discharge status: Home / Self Care | Payer: Self-pay | Attending: Emergency Medicine | Admitting: Emergency Medicine

## 2021-04-10 ENCOUNTER — Encounter (HOSPITAL_COMMUNITY): Payer: Self-pay

## 2021-04-10 DIAGNOSIS — J45909 Unspecified asthma, uncomplicated: Secondary | ICD-10-CM | POA: Insufficient documentation

## 2021-04-10 DIAGNOSIS — F17210 Nicotine dependence, cigarettes, uncomplicated: Secondary | ICD-10-CM | POA: Insufficient documentation

## 2021-04-10 DIAGNOSIS — S70361A Insect bite (nonvenomous), right thigh, initial encounter: Secondary | ICD-10-CM | POA: Insufficient documentation

## 2021-04-10 DIAGNOSIS — W57XXXA Bitten or stung by nonvenomous insect and other nonvenomous arthropods, initial encounter: Secondary | ICD-10-CM | POA: Insufficient documentation

## 2021-04-10 MED ORDER — SULFAMETHOXAZOLE-TRIMETHOPRIM 800-160 MG PO TABS: 1.0000 | ORAL_TABLET | Freq: Two times a day (BID) | ORAL | 0 refills | Status: AC

## 2021-04-10 NOTE — ED Provider Notes (Signed)
[facility] EMERGENCY DEPARTMENT Provider Note   CSN: [id number] Arrival date & time: 04/10/21  1424     History Chief Complaint  Patient presents with   Abscess    Susan [name] is a 45 y.o. female.   Abscess Associated symptoms: no fever, no nausea and no vomiting       Susan [name] is a 45 y.o. female who presents to the Emergency Department complaining of possible insect bite to right posterior thigh.  Patient noticed a red "bump" 3 days ago.  She notes a small amount of drainage to the area or after squeezing on it.  She notes some discomfort to the area with palpation.  No persistent drainage, red streaking, fever, chills.    Past Medical History:  Diagnosis Date   Alcohol abuse    Asthma    BV (bacterial vaginosis) 03/06/2013   Chronic dental pain    Chronic pelvic pain in female    Depression    Ectopic pregnancy    GERD (gastroesophageal reflux disease)    Panic attacks    Polysubstance abuse (HCC)    cocaine, opiates, marijuana   Trichomonas vaginitis     Patient Active Problem List   Diagnosis Date Noted   Tobacco use    Cocaine use    Sepsis due to Escherichia coli without acute organ dysfunction (HCC)    Pyelonephritis 10/08/2020   Polysubstance dependence (HCC) 07/27/2019   MDD (major depressive disorder), recurrent episode, severe (HCC) 01/23/2019   PTSD (post-traumatic stress disorder) 06/07/2017   Substance induced mood disorder (HCC) 06/04/2017   Preterm labor 04/10/2017   Asymptomatic bacteriuria during pregnancy in second trimester 03/14/2017   Late prenatal care in second trimester 03/08/2017   Supervision of normal pregnancy 03/08/2017   MDD (major depressive disorder), single episode, severe with psychotic features (HCC) 01/05/2017   Alcohol use disorder, severe, dependence (HCC) 01/05/2017   Cocaine use disorder, moderate, dependence (HCC) 01/05/2017   Cannabis use disorder, moderate, dependence (HCC) 01/05/2017   Heavy smoker  02/03/2016   Cocaine abuse affecting pregnancy, antepartum (HCC) 02/03/2016   Chronic, continuous use of opioids 03/24/2015   Depression with anxiety 03/24/2015   Poor dentition 03/24/2015   Sprain of ankle, unspecified site 05/28/2009    Past Surgical History:  Procedure Laterality Date   ECTOPIC PREGNANCY SURGERY       OB History     Gravida  9   Para  5   Term  4   Preterm  1   AB  4   Living  5      SAB  1   IAB  2   Ectopic  1   Multiple  0   Live Births  5           Family History  Problem Relation Age of Onset   COPD Mother    Stroke Mother    Heart disease Maternal Grandmother    Hypertension Maternal Grandmother    Stroke Maternal Grandfather    Asthma Sister    Down syndrome Sister     Social History   Tobacco Use   Smoking status: Every Day    Packs/day: 1.00    Years: 26.00    Pack years: 26.00    Types: Cigarettes   Smokeless tobacco: Never  Vaping Use   Vaping Use: Never used  Substance Use Topics   Alcohol use: Yes    Alcohol/week: 3.0 - 4.0 standard drinks    Types:  3 - 4 Cans of beer per week    Comment: drinks about a 40oz about once a week.    Drug use: Not Currently    Types: Marijuana, Cocaine    Home Medications Prior to Admission medications   Medication Sig Start Date End Date Taking? Authorizing Provider  sulfamethoxazole-trimethoprim (BACTRIM DS) 800-160 MG tablet Take 1 tablet by mouth 2 (two) times daily for 7 days. 04/10/21 04/17/21 Yes Luisana Lutzke, PA-C  acyclovir ointment (ZOVIRAX) 5 % Apply topically 4 (four) times daily. To lip Patient not taking: No sig reported 07/30/19   Aldean Baker, NP  busPIRone (BUSPAR) 10 MG tablet Take 1 tablet (10 mg total) by mouth 3 (three) times daily. Patient not taking: No sig reported 10/02/20   Toy Cookey E, NP  cefdinir (OMNICEF) 300 MG capsule TAKE 1 CAPSULE (300 MG TOTAL) BY MOUTH TWO TIMES DAILY FOR 5 DAYS. 10/10/20 10/10/21  Lilland, Percival Spanish, DO   cyclobenzaprine (FLEXERIL) 10 MG tablet Take 1 tablet (10 mg total) by mouth 2 (two) times daily as needed for muscle spasms. Patient not taking: No sig reported 09/06/20   Namon Cirri E, PA-C  escitalopram (LEXAPRO) 10 MG tablet Take 1 tablet (10 mg total) by mouth daily. Patient not taking: No sig reported 10/02/20   Toy Cookey E, NP  gabapentin (NEURONTIN) 300 MG capsule Take 1 capsule (300 mg total) by mouth 3 (three) times daily. Patient not taking: No sig reported 10/02/20   Shanna Cisco, NP  hydrocerin (EUCERIN) CREA Apply 1 application topically 3 (three) times daily as needed (eczema). Patient not taking: No sig reported 07/30/19   Aldean Baker, NP  hydrOXYzine (ATARAX/VISTARIL) 50 MG tablet Take 1 tablet (50 mg total) by mouth 3 (three) times daily. Patient not taking: No sig reported 10/02/20   Toy Cookey E, NP  ibuprofen (ADVIL) 200 MG tablet Take 400 mg by mouth every 6 (six) hours as needed for mild pain.    [provider]  lurasidone 60 MG TABS Take 1 tablet (60 mg total) by mouth daily with breakfast. Patient not taking: No sig reported 10/02/20   Shanna Cisco, NP  neomycin-bacitracin-polymyxin (NEOSPORIN) OINT Apply 1 application topically as needed for irritation. Patient not taking: No sig reported 07/30/19   Aldean Baker, NP  nicotine (NICODERM CQ - DOSED IN MG/24 HOURS) 21 mg/24hr patch Place 1 patch (21 mg total) onto the skin daily. Patient not taking: No sig reported 10/02/20   Shanna Cisco, NP  Oxcarbazepine (TRILEPTAL) 300 MG tablet Take 1 tablet (300 mg total) by mouth in the morning, at noon, and at bedtime. Patient not taking: No sig reported 10/02/20   Toy Cookey E, NP  pantoprazole (PROTONIX) 20 MG tablet Take 2 tablets (40 mg total) by mouth daily for 14 days. Patient not taking: Reported on 10/08/2020 01/17/20 01/31/20  Alvira Monday, MD  predniSONE (STERAPRED UNI-PAK 21 TAB) 10 MG (21) TBPK tablet Take 6  tabs by mouth daily for 1 day, then 5 tabs for  1 day, then 4 tabs for 1 day, then 3 tabs for 1 day, 2 tabs for 1 day, then 1 tab by mouth daily for 1 day 01/14/21   Terrilee Files, MD    Allergies    Patient has no known allergies.  Review of Systems   Review of Systems  Constitutional:  Negative for chills and fever.  Gastrointestinal:  Negative for abdominal pain, nausea and vomiting.  Musculoskeletal:  Negative for arthralgias and joint swelling.  Skin:  Negative for color change.  Hematological:  Negative for adenopathy.   Physical Exam Updated Vital Signs BP 95/60 (BP Location: Right Arm)   Pulse 79   Temp (!) 97.5 F (36.4 C) (Temporal)   Resp 18   Ht 5\' 1"  (1.549 m)   Wt 68 kg   LMP 04/02/2021   SpO2 99%   BMI 28.34 kg/m   Physical Exam Vitals and nursing note reviewed.  Constitutional:      General: She is not in acute distress.    Appearance: Normal appearance. She is not toxic-appearing.  Neck:     Thyroid: No thyromegaly.     Meningeal: Kernig's sign absent.  Cardiovascular:     Rate and Rhythm: Normal rate and regular rhythm.  Pulmonary:     Effort: Pulmonary effort is normal.     Breath sounds: No wheezing.  Abdominal:     Palpations: Abdomen is soft.     Tenderness: There is no abdominal tenderness.  Musculoskeletal:        General: Normal range of motion.  Skin:    General: Skin is warm.     Capillary Refill: Capillary refill takes less than 2 seconds.     Findings: No bruising or rash.     Comments: Localized, 2 cm area of mild erythema with a de-roofed papule.  No induration, fluctuance or surrounding erythema.  No excess warmth.   Neurological:     General: No focal deficit present.     Mental Status: She is alert.    ED Results / Procedures / Treatments   Labs (all labs ordered are listed, but only abnormal results are displayed) Labs Reviewed - No data to display  EKG None  Radiology No results found.  Procedures Procedures    Medications Ordered in ED Medications - No data to display  ED Course  I have reviewed the triage vital signs and the nursing notes.  Pertinent labs & imaging results that were available during my care of the patient were reviewed by me and considered in my medical decision making (see chart for details).    MDM Rules/Calculators/A&P                           Patient here with small localized erythematous papule.  No indication of abscess at this time.  Likely insect bite.  No cellulitis.  No indication for I&D at this time.  Patient well-appearing nontoxic.  She is agreeable to treatment plan with oral antibiotic and topical over-the-counter hydrocortisone cream.  Return precautions were discussed.   Final Clinical Impression(s) / ED Diagnoses Final diagnoses:  Insect bite of right thigh, initial encounter    Rx / DC Orders ED Discharge Orders          Ordered    sulfamethoxazole-trimethoprim (BACTRIM DS) 800-160 MG tablet  2 times daily        04/10/21 1554             06/10/21, PA-C 04/10/21 1609    Long, 06/10/21, MD 04/14/21 1736

## 2021-04-10 NOTE — ED Triage Notes (Signed)
Pt presents to ED with complaints of abscess to right inner thigh x 3 days, +draining, denies fevers

## 2021-04-10 NOTE — Discharge Instructions (Addendum)
As discussed, clean the area with mild soap and water.  You may apply over-the-counter 1% hydrocortisone 1-2 times per day.  Take the antibiotic as directed until its finished.  Return here for any new or worsening symptoms.

## 2021-04-30 ENCOUNTER — Other Ambulatory Visit (HOSPITAL_COMMUNITY): Payer: Self-pay | Admitting: Nurse Practitioner

## 2021-04-30 DIAGNOSIS — Z1231 Encounter for screening mammogram for malignant neoplasm of breast: Secondary | ICD-10-CM

## 2021-05-01 ENCOUNTER — Other Ambulatory Visit (HOSPITAL_COMMUNITY): Payer: Self-pay | Admitting: Nurse Practitioner

## 2021-05-01 ENCOUNTER — Other Ambulatory Visit: Payer: Self-pay | Admitting: Nurse Practitioner

## 2021-05-01 DIAGNOSIS — R102 Pelvic and perineal pain: Secondary | ICD-10-CM

## 2021-05-01 DIAGNOSIS — N852 Hypertrophy of uterus: Secondary | ICD-10-CM

## 2021-05-01 DIAGNOSIS — N920 Excessive and frequent menstruation with regular cycle: Secondary | ICD-10-CM

## 2021-05-04 ENCOUNTER — Telehealth: Payer: Self-pay

## 2021-05-04 NOTE — Telephone Encounter (Signed)
F/u with pt per her [facility] Renewal enrollment and request to establish care with a new PCP ([facility] [place] [facility]).    Pt was unavailable at the time of phone call but a message was left with friend Simona Huh) to contact [facility] back in order for her to receive assistance with getting her first medical appt establishedl  Her records states she is in need of PCP due to dental health and reproductive health issues  In addition verbal message a text message reminder was completed for pt to return call as well.Susan [name]

## 2021-05-05 ENCOUNTER — Telehealth: Payer: Self-pay

## 2021-05-05 NOTE — Telephone Encounter (Addendum)
Pt f/u with [facility]/Clara Gunn on today to get  their first medical appointment scheduled with the [facility] per completing the [facility] enrollment and eligibility recently.     A brief medical assessment/interview was conducted by phone to assess urgency of appointment and medical conditions that may be needed to be addressed prior to first medical  appointment.     Chief Medical Complaint(s): Dental Issues with intermittent pain and current swollen gums.   States she is also needing to establish care with a PCP due to recent ER visit and follow up needed from concerns and  care needed for  further evalution of suspected reproductive problems.     Basic first aid measures for assisting with controlling pain and swelling of dental issues were provided until she is able to attend her first appointent with [facility]   First appt has been scheduled for Magdalene Molly) pt Sept 29, 2022 @ 8:15 am  Information was reveiwed regarding "arrival time, bringing meds if applicable and cost ($10) one time donation fee for first visit, and cancellation/rescheduling appt policy.  Pt stated they understood and phone called

## 2021-05-07 ENCOUNTER — Ambulatory Visit (HOSPITAL_COMMUNITY): Payer: Self-pay

## 2021-05-07 ENCOUNTER — Ambulatory Visit: Payer: Medicaid Other | Admitting: Physician Assistant

## 2021-05-07 ENCOUNTER — Encounter (HOSPITAL_COMMUNITY): Payer: Self-pay

## 2021-05-08 ENCOUNTER — Ambulatory Visit (HOSPITAL_COMMUNITY): Payer: Medicaid Other

## 2021-05-14 ENCOUNTER — Other Ambulatory Visit: Payer: Self-pay

## 2021-05-14 ENCOUNTER — Ambulatory Visit: Payer: Medicaid Other | Admitting: Physician Assistant

## 2021-05-14 ENCOUNTER — Encounter: Payer: Self-pay | Admitting: Physician Assistant

## 2021-05-14 VITALS — BP 122/68 | HR 81 | Temp 97.8°F | Ht 62.5 in | Wt 159.0 lb

## 2021-05-14 DIAGNOSIS — Z7689 Persons encountering health services in other specified circumstances: Secondary | ICD-10-CM

## 2021-05-14 DIAGNOSIS — F17200 Nicotine dependence, unspecified, uncomplicated: Secondary | ICD-10-CM

## 2021-05-14 DIAGNOSIS — K0889 Other specified disorders of teeth and supporting structures: Secondary | ICD-10-CM

## 2021-05-14 MED ORDER — AMOXICILLIN 500 MG PO CAPS: 500.0000 mg | ORAL_CAPSULE | Freq: Three times a day (TID) | ORAL | 0 refills | Status: AC

## 2021-05-14 NOTE — Progress Notes (Signed)
   BP 122/68   Pulse 81   Temp 97.8 F (36.6 C)   Ht 5' 2.5" (1.588 m)   Wt 159 lb (72.1 kg)   SpO2 95%   BMI 28.62 kg/m    Subjective:    Patient ID: Susan [name], female    DOB: [birth year], 45 y.o.   MRN: [id number]  HPI: Susan [name] is a 45 y.o. female presenting on 05/14/2021 for New Patient (Initial Visit)   HPI  Chief Complaint  Patient presents with   New Patient (Initial Visit)     Pt is 44yoF who presents to establish care. She was Seen here 1 time in the past- on 11/06/2018 - for new patient appointment but she never returned.   She was a no-show to her appointment here on 05/07/21 but called yesterday complaining of a dental infection so she was worked in today.  She is not currently going for MH.   She was going somewhere in [place] but can't go there now due to transportation.  It's been about 5 months.  She says she is doing okay from that stance.  Pt was observed to be coughing and congested and she says she has a cold.  She did not tell the screener this during her covid 19 screening; she told the screener she did not have cough and congestion.      Relevant past medical, surgical, family and social history reviewed and updated as indicated. Interim medical history since our last visit reviewed. Allergies and medications reviewed and updated.  Review of Systems  Per HPI unless specifically indicated above     Objective:    BP 122/68   Pulse 81   Temp 97.8 F (36.6 C)   Ht 5' 2.5" (1.588 m)   Wt 159 lb (72.1 kg)   SpO2 95%   BMI 28.62 kg/m   Wt Readings from Last 3 Encounters:  05/14/21 159 lb (72.1 kg)  04/10/21 150 lb (68 kg)  03/11/20 170 lb (77.1 kg)    Physical Exam Constitutional:      General: She is not in acute distress.    Appearance: She is not toxic-appearing.  HENT:     Head: Normocephalic and atraumatic.     Nose: Congestion present.  Pulmonary:     Effort: No respiratory distress.     Comments: Frequent  coughing Neurological:     Mental Status: She is alert and oriented to person, place, and time.  Psychiatric:        Speech: Speech normal.           Assessment & Plan:    Encounter Diagnoses  Name Primary?   Encounter to establish care Yes   Dentalgia    Tobacco use disorder      Pt given rx for tooth complaint.  She is asked to call office next week to schedule a return appointment for a month out after her URI symptoms have resolved.  Discussed with pt the importance of answering covid screening questions honestly.

## 2021-05-21 ENCOUNTER — Ambulatory Visit (HOSPITAL_COMMUNITY): Admission: RE | Admit: 2021-05-21 | Payer: Self-pay | Source: Ambulatory Visit

## 2021-05-25 ENCOUNTER — Other Ambulatory Visit: Payer: Self-pay

## 2021-05-25 ENCOUNTER — Emergency Department (HOSPITAL_COMMUNITY)
Admission: EM | Admit: 2021-05-25 | Discharge: 2021-05-25 | Disposition: A | Discharge status: Home / Self Care | Payer: Self-pay | Attending: Emergency Medicine | Admitting: Emergency Medicine

## 2021-05-25 ENCOUNTER — Emergency Department (HOSPITAL_COMMUNITY): Payer: Self-pay

## 2021-05-25 ENCOUNTER — Encounter (HOSPITAL_COMMUNITY): Payer: Self-pay | Admitting: *Deleted

## 2021-05-25 DIAGNOSIS — U071 COVID-19: Secondary | ICD-10-CM | POA: Insufficient documentation

## 2021-05-25 DIAGNOSIS — R1084 Generalized abdominal pain: Secondary | ICD-10-CM

## 2021-05-25 DIAGNOSIS — F17210 Nicotine dependence, cigarettes, uncomplicated: Secondary | ICD-10-CM | POA: Insufficient documentation

## 2021-05-25 DIAGNOSIS — R059 Cough, unspecified: Secondary | ICD-10-CM

## 2021-05-25 DIAGNOSIS — J45909 Unspecified asthma, uncomplicated: Secondary | ICD-10-CM | POA: Insufficient documentation

## 2021-05-25 DIAGNOSIS — D72829 Elevated white blood cell count, unspecified: Secondary | ICD-10-CM | POA: Insufficient documentation

## 2021-05-25 LAB — CBC WITH DIFFERENTIAL/PLATELET
Abs Immature Granulocytes: 0.03 10*3/uL (ref 0.00–0.07)
Basophils Absolute: 0.1 10*3/uL (ref 0.0–0.1)
Basophils Relative: 0 %
Eosinophils Absolute: 0.1 10*3/uL (ref 0.0–0.5)
Eosinophils Relative: 1 %
HCT: 40.2 % (ref 36.0–46.0)
Hemoglobin: 13.2 g/dL (ref 12.0–15.0)
Immature Granulocytes: 0 %
Lymphocytes Relative: 28 %
Lymphs Abs: 3.4 10*3/uL (ref 0.7–4.0)
MCH: 32.4 pg (ref 26.0–34.0)
MCHC: 32.8 g/dL (ref 30.0–36.0)
MCV: 98.5 fL (ref 80.0–100.0)
Monocytes Absolute: 0.5 10*3/uL (ref 0.1–1.0)
Monocytes Relative: 4 %
Neutro Abs: 8.1 10*3/uL — ABNORMAL HIGH (ref 1.7–7.7)
Neutrophils Relative %: 67 %
Platelets: 309 10*3/uL (ref 150–400)
RBC: 4.08 MIL/uL (ref 3.87–5.11)
RDW: 12.8 % (ref 11.5–15.5)
WBC: 12.2 10*3/uL — ABNORMAL HIGH (ref 4.0–10.5)
nRBC: 0 % (ref 0.0–0.2)

## 2021-05-25 LAB — COMPREHENSIVE METABOLIC PANEL
ALT: 16 U/L (ref 0–44)
AST: 15 U/L (ref 15–41)
Albumin: 4 g/dL (ref 3.5–5.0)
Alkaline Phosphatase: 70 U/L (ref 38–126)
Anion gap: 8 (ref 5–15)
BUN: 12 mg/dL (ref 6–20)
CO2: 23 mmol/L (ref 22–32)
Calcium: 8.9 mg/dL (ref 8.9–10.3)
Chloride: 105 mmol/L (ref 98–111)
Creatinine, Ser: 0.5 mg/dL (ref 0.44–1.00)
GFR, Estimated: >60 mL/min (ref >60)
Glucose, Bld: 140 mg/dL — ABNORMAL HIGH (ref 70–99)
Potassium: 4.2 mmol/L (ref 3.5–5.1)
Sodium: 136 mmol/L (ref 135–145)
Total Bilirubin: 0.4 mg/dL (ref 0.3–1.2)
Total Protein: 6.7 g/dL (ref 6.5–8.1)

## 2021-05-25 LAB — URINALYSIS, ROUTINE W REFLEX MICROSCOPIC
Bilirubin Urine: NEGATIVE
Glucose, UA: NEGATIVE mg/dL
Hgb urine dipstick: NEGATIVE
Ketones, ur: NEGATIVE mg/dL
Leukocytes,Ua: NEGATIVE
Nitrite: NEGATIVE
Protein, ur: NEGATIVE mg/dL
Specific Gravity, Urine: 1.032 — ABNORMAL HIGH (ref 1.005–1.030)
pH: 5 (ref 5.0–8.0)

## 2021-05-25 LAB — LIPASE, BLOOD: Lipase: 35 U/L (ref 11–51)

## 2021-05-25 LAB — RESP PANEL BY RT-PCR (FLU A&B, COVID) ARPGX2
Influenza A by PCR: NEGATIVE
Influenza B by PCR: NEGATIVE
SARS Coronavirus 2 by RT PCR: POSITIVE — AB

## 2021-05-25 MED ORDER — BENZONATATE 100 MG PO CAPS: 100.0000 mg | ORAL_CAPSULE | Freq: Three times a day (TID) | ORAL | 0 refills | Status: DC

## 2021-05-25 MED ORDER — VALACYCLOVIR HCL 1 G PO TABS: 1000.0000 mg | ORAL_TABLET | Freq: Three times a day (TID) | ORAL | 0 refills | Status: DC

## 2021-05-25 NOTE — Discharge Instructions (Addendum)
Take Valtrex 3 times daily for the next 7 days.  This is antiviral medicine to take for the herpes infection. Take Tessalon Perles every 8 hours as needed for cough.  Your work-up is negative today for acute infection, please check MyChart for your COVID results in the next 24 hours.  Follow-up with your primary care doctor if the symptoms continue. Follow-up with your OBG and health department as discussed to have the ultrasound done of the uterus.  Return to the ED as needed if things change or worsen

## 2021-05-25 NOTE — ED Triage Notes (Signed)
Also c/o low abdominal pain, states she does not have a doctor and want it checked out , has had pain for over a month

## 2021-05-25 NOTE — ED Triage Notes (Signed)
Cough for 3 weeks

## 2021-05-25 NOTE — ED Provider Notes (Signed)
[facility] EMERGENCY DEPARTMENT Provider Note   CSN: [id number] Arrival date & time: 05/25/21  1130     History Chief Complaint  Patient presents with   Cough   Abdominal Pain    Susan [name] is a 45 y.o. female.   Cough Associated symptoms: rash   Associated symptoms: no chest pain, no chills, no ear pain, no fever, no shortness of breath and no sore throat   Abdominal Pain Associated symptoms: cough   Associated symptoms: no chest pain, no chills, no dysuria, no fever, no hematuria, no nausea, no shortness of breath, no sore throat and no vomiting    Patient presents with multiple concerns.  1-patient has cough.  Been ongoing for 3 weeks, constant.  Worse when she lays down flat, no associated congestion.  Patient does smoke cigarettes, no history of COPD.  No associated chest pain, does feel short of breath occasionally.  Is not having any hemoptysis or hematemesis.  Has not tried any alleviating factors.  2-patient concerned about abdominal pain.  This is been going on for months, denies any acute onset.  It comes and goes, worse with pressure.  Reports she is supposed to have an ultrasound done to evaluate for uterine fibroids.  Reports it is scheduled but has not had time to do it yet.  Denies any vaginal discharge, vaginal bleeding, nausea, vomiting.  Has tried Tylenol which improves the pain.  Pressure worsens the pain.  3-patient concerned about genital herpes.  Her boyfriend just recently tested positive.  She has had a vesicular rash over her lip for the last 2 days.  Denies any additional symptoms.  Past Medical History:  Diagnosis Date   Alcohol abuse    Asthma    BV (bacterial vaginosis) 03/06/2013   Chronic dental pain    Chronic pelvic pain in female    Depression    Ectopic pregnancy    GERD (gastroesophageal reflux disease)    Panic attacks    Polysubstance abuse (HCC)    cocaine, opiates, marijuana   Trichomonas vaginitis     Patient Active  Problem List   Diagnosis Date Noted   Tobacco use    Cocaine use    Sepsis due to Escherichia coli without acute organ dysfunction (HCC)    Pyelonephritis 10/08/2020   Polysubstance dependence (HCC) 07/27/2019   MDD (major depressive disorder), recurrent episode, severe (HCC) 01/23/2019   PTSD (post-traumatic stress disorder) 06/07/2017   Substance induced mood disorder (HCC) 06/04/2017   Preterm labor 04/10/2017   Asymptomatic bacteriuria during pregnancy in second trimester 03/14/2017   Late prenatal care in second trimester 03/08/2017   Supervision of normal pregnancy 03/08/2017   MDD (major depressive disorder), single episode, severe with psychotic features (HCC) 01/05/2017   Alcohol use disorder, severe, dependence (HCC) 01/05/2017   Cocaine use disorder, moderate, dependence (HCC) 01/05/2017   Cannabis use disorder, moderate, dependence (HCC) 01/05/2017   Heavy smoker 02/03/2016   Cocaine abuse affecting pregnancy, antepartum (HCC) 02/03/2016   Chronic, continuous use of opioids 03/24/2015   Depression with anxiety 03/24/2015   Poor dentition 03/24/2015   Sprain of ankle, unspecified site 05/28/2009    Past Surgical History:  Procedure Laterality Date   ECTOPIC PREGNANCY SURGERY       OB History     Gravida  9   Para  5   Term  4   Preterm  1   AB  4   Living  5  SAB  1   IAB  2   Ectopic  1   Multiple  0   Live Births  5           Family History  Problem Relation Age of Onset   COPD Mother    Stroke Mother    Heart disease Maternal Grandmother    Hypertension Maternal Grandmother    Stroke Maternal Grandfather    Asthma Sister    Down syndrome Sister     Social History   Tobacco Use   Smoking status: Every Day    Packs/day: 1.00    Years: 26.00    Pack years: 26.00    Types: Cigarettes   Smokeless tobacco: Never  Vaping Use   Vaping Use: Never used  Substance Use Topics   Alcohol use: Yes    Alcohol/week: 2.0 standard  drinks    Types: 2 Cans of beer per week    Comment: drinks about a 40oz about once a week.    Drug use: Not Currently    Types: Marijuana, Cocaine    Home Medications Prior to Admission medications   Medication Sig Start Date End Date Taking? Authorizing Provider  acyclovir ointment (ZOVIRAX) 5 % Apply topically 4 (four) times daily. To lip Patient not taking: No sig reported 07/30/19   Aldean Baker, NP  busPIRone (BUSPAR) 10 MG tablet Take 1 tablet (10 mg total) by mouth 3 (three) times daily. Patient not taking: No sig reported 10/02/20   Toy Cookey E, NP  cefdinir (OMNICEF) 300 MG capsule TAKE 1 CAPSULE (300 MG TOTAL) BY MOUTH TWO TIMES DAILY FOR 5 DAYS. Patient not taking: Reported on 05/14/2021 10/10/20 10/10/21  Lilland, Percival Spanish, DO  cyclobenzaprine (FLEXERIL) 10 MG tablet Take 1 tablet (10 mg total) by mouth 2 (two) times daily as needed for muscle spasms. Patient not taking: No sig reported 09/06/20   Namon Cirri E, PA-C  escitalopram (LEXAPRO) 10 MG tablet Take 1 tablet (10 mg total) by mouth daily. Patient not taking: No sig reported 10/02/20   Toy Cookey E, NP  gabapentin (NEURONTIN) 300 MG capsule Take 1 capsule (300 mg total) by mouth 3 (three) times daily. Patient not taking: No sig reported 10/02/20   Shanna Cisco, NP  hydrocerin (EUCERIN) CREA Apply 1 application topically 3 (three) times daily as needed (eczema). Patient not taking: No sig reported 07/30/19   Aldean Baker, NP  hydrOXYzine (ATARAX/VISTARIL) 50 MG tablet Take 1 tablet (50 mg total) by mouth 3 (three) times daily. Patient not taking: No sig reported 10/02/20   Toy Cookey E, NP  ibuprofen (ADVIL) 200 MG tablet Take 400 mg by mouth every 6 (six) hours as needed for mild pain. Patient not taking: Reported on 05/14/2021    [provider]  lurasidone 60 MG TABS Take 1 tablet (60 mg total) by mouth daily with breakfast. Patient not taking: No sig reported 10/02/20   Shanna Cisco, NP  neomycin-bacitracin-polymyxin (NEOSPORIN) OINT Apply 1 application topically as needed for irritation. Patient not taking: No sig reported 07/30/19   Aldean Baker, NP  nicotine (NICODERM CQ - DOSED IN MG/24 HOURS) 21 mg/24hr patch Place 1 patch (21 mg total) onto the skin daily. Patient not taking: No sig reported 10/02/20   Shanna Cisco, NP  Oxcarbazepine (TRILEPTAL) 300 MG tablet Take 1 tablet (300 mg total) by mouth in the morning, at noon, and at bedtime. Patient not taking: No sig reported 10/02/20  Toy Cookey E, NP  pantoprazole (PROTONIX) 20 MG tablet Take 2 tablets (40 mg total) by mouth daily for 14 days. Patient not taking: Reported on 10/08/2020 01/17/20 01/31/20  Alvira Monday, MD  predniSONE (STERAPRED UNI-PAK 21 TAB) 10 MG (21) TBPK tablet Take 6 tabs by mouth daily for 1 day, then 5 tabs for  1 day, then 4 tabs for 1 day, then 3 tabs for 1 day, 2 tabs for 1 day, then 1 tab by mouth daily for 1 day Patient not taking: Reported on 05/14/2021 01/14/21   Terrilee Files, MD    Allergies    Patient has no known allergies.  Review of Systems   Review of Systems  Constitutional:  Negative for chills and fever.  HENT:  Negative for ear pain and sore throat.   Eyes:  Negative for pain and visual disturbance.  Respiratory:  Positive for cough. Negative for shortness of breath.   Cardiovascular:  Negative for chest pain and palpitations.  Gastrointestinal:  Positive for abdominal pain. Negative for nausea and vomiting.  Genitourinary:  Negative for dysuria, hematuria and vaginal pain.  Musculoskeletal:  Negative for arthralgias and back pain.  Skin:  Positive for rash. Negative for color change.  Neurological:  Negative for seizures and syncope.  All other systems reviewed and are negative.  Physical Exam Updated Vital Signs BP 127/76 (BP Location: Right Arm)   Pulse 78   Temp 98.6 F (37 C) (Oral)   Resp 16   Ht 5\' 1"  (1.549 m)   Wt 72.8 kg    SpO2 98%   BMI 30.33 kg/m   Physical Exam Vitals and nursing note reviewed. Exam conducted with a chaperone present.  Constitutional:      General: She is not in acute distress.    Appearance: Normal appearance. She is well-developed.  HENT:     Head: Normocephalic and atraumatic.  Eyes:     General: No scleral icterus.    Extraocular Movements: Extraocular movements intact.     Conjunctiva/sclera: Conjunctivae normal.     Pupils: Pupils are equal, round, and reactive to light.  Cardiovascular:     Rate and Rhythm: Normal rate and regular rhythm.     Heart sounds: No murmur heard. Pulmonary:     Effort: Pulmonary effort is normal. No respiratory distress.     Breath sounds: Normal breath sounds.     Comments: Lungs are clear to auscultation bilaterally, patient speaking complete sentences.  No tachypnea or adventitious lung sounds. Abdominal:     General: Abdomen is flat.     Palpations: Abdomen is soft.     Tenderness: There is abdominal tenderness in the suprapubic area.     Comments: Abdomen is soft, some suprapubic tenderness.  No rigidity or guarding, no peritoneal signs.  Musculoskeletal:     Cervical back: Neck supple.  Skin:    General: Skin is warm and dry.     Coloration: Skin is not jaundiced.     Comments: Vesicular rash noted of the patient's upper lip  Neurological:     Mental Status: She is alert. Mental status is at baseline.     Coordination: Coordination normal.    ED Results / Procedures / Treatments   Labs (all labs ordered are listed, but only abnormal results are displayed) Labs Reviewed  CBC WITH DIFFERENTIAL/PLATELET - Abnormal; Notable for the following components:      Result Value   WBC 12.2 (*)    Neutro Abs 8.1 (*)  All other components within normal limits  COMPREHENSIVE METABOLIC PANEL - Abnormal; Notable for the following components:   Glucose, Bld 140 (*)    All other components within normal limits  URINALYSIS, ROUTINE W REFLEX  MICROSCOPIC - Abnormal; Notable for the following components:   APPearance HAZY (*)    Specific Gravity, Urine 1.032 (*)    All other components within normal limits  RESP PANEL BY RT-PCR (FLU A&B, COVID) ARPGX2  LIPASE, BLOOD  I-STAT BETA HCG BLOOD, ED ([facility], [facility], [facility] ONLY)    EKG None  Radiology No results found.  Procedures Procedures   Medications Ordered in ED Medications - No data to display  ED Course  I have reviewed the triage vital signs and the nursing notes.  Pertinent labs & imaging results that were available during my care of the patient were reviewed by me and considered in my medical decision making (see chart for details).    MDM Rules/Calculators/A&P                           Patient vitals are stable, and patient is nontoxic-appearing.  She does have some minor abdominal pain, this been going on for months and I doubt any acute origin.  Lipase negative, doubt pancreatitis.  UA negative for any signs of UTI, doubt pyelonephritis.  No hemoglobin, doubt nephrolithiasis.  Will allow patient to follow-up with her OBG for ultrasound, do not think more emergent work-up is indicated at this time.  Patient does have mild leukocytosis with left shift.  She has been coughing for 3 weeks, think it is appropriate to get an x-ray to evaluate for pneumonia or underlying lung disease.  COVID test already ordered and is pending.  Radiograph negative for pneumonia.  Patient has rash consistent with HSV outbreak.  Will provide education and antiviral medicine.  Patient discharged in stable position.  Final Clinical Impression(s) / ED Diagnoses Final diagnoses:  Acute cough  Generalized abdominal pain    Rx / DC Orders ED Discharge Orders     None        Theron Arista, PA-C 05/25/21 1821    Derwood Kaplan, MD 06/02/21 1040

## 2021-05-25 NOTE — Congregational Nurse Program (Signed)
Client recently enrolled with [facility] and was referred to [facility].  Was seen on 05/14/21 and given antibiotics for dental issue and instructed to call back the following week to schedule a 1 month follow up visit. Client has not called yet.  Today client encouraged to call to set up [facility]. Provided client with note reminder and [facility] phone number.   This also served as a follow up contact for [facility].  Plan: to continue to attempt Social Work interns to try to reach regarding any resource needs.   Susan Nodal RN [facility]/gallery curator Nurse Program RN

## 2021-06-17 ENCOUNTER — Other Ambulatory Visit: Payer: Self-pay | Admitting: Physician Assistant

## 2021-06-17 ENCOUNTER — Telehealth: Payer: Self-pay | Admitting: Physician Assistant

## 2021-06-17 MED ORDER — AMOXICILLIN 500 MG PO CAPS: 500.0000 mg | ORAL_CAPSULE | Freq: Three times a day (TID) | ORAL | 0 refills | Status: AC

## 2021-06-17 NOTE — Telephone Encounter (Signed)
Patient called in wanting appointment for follow up from her first appointment. Patient wanted to be seen right away due to she had infection in her tooth again. Appointments were not available ASAP so patient was made an appointment for 11/17 and an antibiotic for her tooth was called into [facility] in [place]. Patient was advised if she did not come in for her appointment on 11/17 antibiotics will not be called in for emergent care needs any lomger.

## 2021-06-22 NOTE — Congregational Nurse Program (Signed)
Blood pressure screening at [facility]  Blood pressure 107/69 Pulse 72  Client established at [facility] encouraged to follow up with appointments.  Francee Nodal RN  [employer]

## 2021-06-25 ENCOUNTER — Other Ambulatory Visit: Payer: Self-pay

## 2021-06-25 ENCOUNTER — Encounter: Payer: Self-pay | Admitting: Physician Assistant

## 2021-06-25 ENCOUNTER — Ambulatory Visit: Payer: Medicaid Other | Admitting: Physician Assistant

## 2021-06-25 VITALS — BP 114/55 | HR 84 | Temp 97.3°F | Wt 159.0 lb

## 2021-06-25 DIAGNOSIS — F17200 Nicotine dependence, unspecified, uncomplicated: Secondary | ICD-10-CM

## 2021-06-25 DIAGNOSIS — Z131 Encounter for screening for diabetes mellitus: Secondary | ICD-10-CM

## 2021-06-25 DIAGNOSIS — Z5320 Procedure and treatment not carried out because of patient's decision for unspecified reasons: Secondary | ICD-10-CM

## 2021-06-25 DIAGNOSIS — R7309 Other abnormal glucose: Secondary | ICD-10-CM

## 2021-06-25 DIAGNOSIS — F489 Nonpsychotic mental disorder, unspecified: Secondary | ICD-10-CM

## 2021-06-25 DIAGNOSIS — E781 Pure hyperglyceridemia: Secondary | ICD-10-CM

## 2021-06-25 DIAGNOSIS — K0889 Other specified disorders of teeth and supporting structures: Secondary | ICD-10-CM

## 2021-06-25 MED ORDER — NAPROXEN 500 MG PO TABS: 500.0000 mg | ORAL_TABLET | Freq: Two times a day (BID) | ORAL | 1 refills | Status: DC | PRN

## 2021-06-25 NOTE — Progress Notes (Signed)
BP (!) 114/55   Pulse 84   Temp (!) 97.3 F (36.3 C)   Wt 159 lb (72.1 kg)   SpO2 97%   BMI 30.04 kg/m    Subjective:    Patient ID: Susan [name], female    DOB: 01/07/1976, 45 y.o.   MRN: [id number]  HPI: Susan [name] is a 45 y.o. female presenting on 06/25/2021 for Follow-up   HPI   Pt is 44yoF with appointment to follow up her new pt appointment in October.    She requests pain medication.  She says apap doesn't help.  She Works Part Time at a care facility doing cooking and washing.  Pt says only complaints are teeth & gums.  She says she has 4 or 5 that need to be pulled.  She is hoping to get back in to [facility] to get on meds  She went to [facility] in september maybe and was scheduled for [place] in october that she was a no-show for  She has not gotten flu vaccination   Relevant past medical, surgical, family and social history reviewed and updated as indicated. Interim medical history since our last visit reviewed. Allergies and medications reviewed and updated.  CURRENT MEDS: amoxil  Review of Systems  Per HPI unless specifically indicated above     Objective:    BP (!) 114/55   Pulse 84   Temp (!) 97.3 F (36.3 C)   Wt 159 lb (72.1 kg)   SpO2 97%   BMI 30.04 kg/m   Wt Readings from Last 3 Encounters:  06/25/21 159 lb (72.1 kg)  05/25/21 160 lb 8 oz (72.8 kg)  05/14/21 159 lb (72.1 kg)    Physical Exam Vitals reviewed.  Constitutional:      General: She is not in acute distress.    Appearance: She is well-developed. She is not toxic-appearing.  HENT:     Head: Normocephalic and atraumatic.  Cardiovascular:     Rate and Rhythm: Normal rate and regular rhythm.  Pulmonary:     Effort: Pulmonary effort is normal.     Breath sounds: Normal breath sounds.  Abdominal:     General: Bowel sounds are normal.     Palpations: Abdomen is soft. There is no mass.     Tenderness: There is no abdominal tenderness.  Musculoskeletal:     Cervical back:  Neck supple.     Right lower leg: No edema.     Left lower leg: No edema.  Lymphadenopathy:     Cervical: No cervical adenopathy.  Skin:    General: Skin is warm and dry.  Neurological:     Mental Status: She is alert and oriented to person, place, and time.  Psychiatric:        Behavior: Behavior normal.         Assessment & Plan:    Encounter Diagnoses  Name Primary?   Dentalgia Yes   Tobacco use disorder    Mental health problem    Hypertriglyceridemia    Screening mammography declined    Screening for diabetes mellitus    Elevated glucose      -Will have [facility] call pt -Pt to get back in with [facility] for medication management -will Check a1c and lipids/ pt to get labs drawn.  she will be called with results -pt Declined screening mammogram -pt to Continue with [facility] for gyn issues as she has FP medicaid -for dental issues, she is on dental list.  She  is already taking amoxil.  She is given rx for naproxen for pain.  Pt is encouraged to avoid smoking/decrease smoking as it is bad for teeth and gums -pt to follow up here 3 months.  She is to contact office sooner prn

## 2021-06-27 ENCOUNTER — Other Ambulatory Visit: Payer: Self-pay

## 2021-06-27 ENCOUNTER — Emergency Department (HOSPITAL_COMMUNITY)
Admission: EM | Admit: 2021-06-27 | Discharge: 2021-06-27 | Disposition: A | Discharge status: Home / Self Care | Payer: Medicaid Other | Attending: Emergency Medicine | Admitting: Emergency Medicine

## 2021-06-27 ENCOUNTER — Encounter (HOSPITAL_COMMUNITY): Payer: Self-pay | Admitting: Emergency Medicine

## 2021-06-27 DIAGNOSIS — J45909 Unspecified asthma, uncomplicated: Secondary | ICD-10-CM | POA: Insufficient documentation

## 2021-06-27 DIAGNOSIS — F17210 Nicotine dependence, cigarettes, uncomplicated: Secondary | ICD-10-CM | POA: Insufficient documentation

## 2021-06-27 DIAGNOSIS — L292 Pruritus vulvae: Secondary | ICD-10-CM | POA: Insufficient documentation

## 2021-06-27 DIAGNOSIS — N898 Other specified noninflammatory disorders of vagina: Secondary | ICD-10-CM

## 2021-06-27 DIAGNOSIS — K047 Periapical abscess without sinus: Secondary | ICD-10-CM

## 2021-06-27 LAB — WET PREP, GENITAL
Clue Cells Wet Prep HPF POC: NONE SEEN
Sperm: NONE SEEN
Trich, Wet Prep: NONE SEEN
WBC, Wet Prep HPF POC: <10 — AB (ref <10)
Yeast Wet Prep HPF POC: NONE SEEN

## 2021-06-27 LAB — URINALYSIS, ROUTINE W REFLEX MICROSCOPIC
Bilirubin Urine: NEGATIVE
Glucose, UA: NEGATIVE mg/dL
Ketones, ur: NEGATIVE mg/dL
Nitrite: NEGATIVE
Protein, ur: 30 mg/dL — AB
Specific Gravity, Urine: 1.041 — ABNORMAL HIGH (ref 1.005–1.030)
Squamous Epithelial / HPF: >50 — ABNORMAL HIGH (ref 0–5)
pH: 5 (ref 5.0–8.0)

## 2021-06-27 MED ORDER — HYDROCOD POLST-CPM POLST ER 10-8 MG/5ML PO SUER
5.0000 mL | Freq: Once | ORAL | Status: AC
  Administered 2021-06-27: 5 mL via ORAL
  Filled 2021-06-27: qty 5

## 2021-06-27 MED ORDER — HYDROMORPHONE HCL 1 MG/ML IJ SOLN
1.0000 mg | Freq: Once | INTRAMUSCULAR | Status: AC
Start: 2021-06-27 — End: 2021-06-27
  Administered 2021-06-27: 1 mg via INTRAMUSCULAR
  Filled 2021-06-27: qty 1

## 2021-06-27 MED ORDER — ONDANSETRON 4 MG PO TBDP: 4.0000 mg | ORAL_TABLET | Freq: Three times a day (TID) | ORAL | 0 refills | Status: DC | PRN

## 2021-06-27 MED ORDER — HYDROCODONE-ACETAMINOPHEN 5-325 MG PO TABS: 2.0000 | ORAL_TABLET | ORAL | 0 refills | Status: DC | PRN

## 2021-06-27 MED ORDER — KETOROLAC TROMETHAMINE 30 MG/ML IJ SOLN
30.0000 mg | Freq: Once | INTRAMUSCULAR | Status: AC
  Administered 2021-06-27: 30 mg via INTRAMUSCULAR
  Filled 2021-06-27: qty 1

## 2021-06-27 MED ORDER — ONDANSETRON 8 MG PO TBDP
8.0000 mg | ORAL_TABLET | Freq: Once | ORAL | Status: AC
  Administered 2021-06-27: 8 mg via ORAL
  Filled 2021-06-27: qty 1

## 2021-06-27 MED ORDER — CLINDAMYCIN HCL 150 MG PO CAPS: 150.0000 mg | ORAL_CAPSULE | Freq: Four times a day (QID) | ORAL | 0 refills | Status: DC

## 2021-06-27 NOTE — ED Triage Notes (Signed)
Pt here with multiple complaints. Pt wants a sore on her lip checked, c/o dental pain, and pain, itchiness and swelling to her vagina.

## 2021-06-27 NOTE — ED Provider Notes (Signed)
[facility] EMERGENCY DEPARTMENT Provider Note   CSN: [id number] Arrival date & time: 06/27/21  1309     History Chief Complaint  Patient presents with   Abscess    Susan [name] is a 45 y.o. female.  HPI  Patient is concerned about vaginal itching and pain.  This started about a week ago, feels like it swollen.  The pain is been constant, she has not tried any alleviating factors.  Denies any discharge, no vaginal bleeding.  She reports that she has a rash on her upper lip which is consistent with what was there 1 month ago.  Patient is also concerned about a cough that is been going on for a couple weeks.  He is not having any other systemic symptoms.  Patient concerned about a dental abscess.  Has swelling to the left side of her mouth, this is been ongoing for multiple months and she started amoxicillin a couple days ago without any improvement.  No difficulty swallowing, no fevers at home.  Past Medical History:  Diagnosis Date   Alcohol abuse    Asthma    BV (bacterial vaginosis) 03/06/2013   Chronic dental pain    Chronic pelvic pain in female    Depression    Ectopic pregnancy    GERD (gastroesophageal reflux disease)    Panic attacks    Polysubstance abuse (HCC)    cocaine, opiates, marijuana   Trichomonas vaginitis     Patient Active Problem List   Diagnosis Date Noted   Tobacco use    Cocaine use    Sepsis due to Escherichia coli without acute organ dysfunction (HCC)    Pyelonephritis 10/08/2020   Polysubstance dependence (HCC) 07/27/2019   MDD (major depressive disorder), recurrent episode, severe (HCC) 01/23/2019   PTSD (post-traumatic stress disorder) 06/07/2017   Substance induced mood disorder (HCC) 06/04/2017   Preterm labor 04/10/2017   Asymptomatic bacteriuria during pregnancy in second trimester 03/14/2017   Late prenatal care in second trimester 03/08/2017   Supervision of normal pregnancy 03/08/2017   MDD (major depressive disorder), single  episode, severe with psychotic features (HCC) 01/05/2017   Alcohol use disorder, severe, dependence (HCC) 01/05/2017   Cocaine use disorder, moderate, dependence (HCC) 01/05/2017   Cannabis use disorder, moderate, dependence (HCC) 01/05/2017   Heavy smoker 02/03/2016   Cocaine abuse affecting pregnancy, antepartum (HCC) 02/03/2016   Chronic, continuous use of opioids 03/24/2015   Depression with anxiety 03/24/2015   Poor dentition 03/24/2015   Sprain of ankle, unspecified site 05/28/2009    Past Surgical History:  Procedure Laterality Date   ECTOPIC PREGNANCY SURGERY       OB History     Gravida  9   Para  5   Term  4   Preterm  1   AB  4   Living  5      SAB  1   IAB  2   Ectopic  1   Multiple  0   Live Births  5           Family History  Problem Relation Age of Onset   Diabetes Mother    COPD Mother    Stroke Mother    Asthma Sister    Down syndrome Sister    Heart disease Maternal Grandmother    Hypertension Maternal Grandmother    Stroke Maternal Grandfather     Social History   Tobacco Use   Smoking status: Every Day    Packs/day: 1.00  Years: 26.00    Pack years: 26.00    Types: Cigarettes   Smokeless tobacco: Never  Vaping Use   Vaping Use: Some days  Substance Use Topics   Alcohol use: Not Currently    Alcohol/week: 2.0 standard drinks    Types: 2 Cans of beer per week    Comment: history heavy etoh/none since 2021   Drug use: Not Currently    Types: Marijuana, Cocaine    Comment: pt says none in a year but + UDS 6 mo ago    Home Medications Prior to Admission medications   Medication Sig Start Date End Date Taking? Authorizing Provider  amoxicillin (AMOXIL) 500 MG capsule Take 500 mg by mouth 3 (three) times daily.    [provider]  naproxen (NAPROSYN) 500 MG tablet Take 1 tablet (500 mg total) by mouth 2 (two) times daily as needed. With food 06/25/21   Jacquelin Hawking, PA-C    Allergies    Patient has no  known allergies.  Review of Systems   Review of Systems  Constitutional:  Negative for fever.  HENT:  Positive for dental problem. Negative for congestion and drooling.   Respiratory:  Positive for cough.   Cardiovascular:  Negative for chest pain.  Gastrointestinal:  Negative for anal bleeding, diarrhea and vomiting.  Genitourinary:  Positive for vaginal discharge and vaginal pain. Negative for decreased urine volume and vaginal bleeding.  Skin:  Positive for rash.   Physical Exam Updated Vital Signs BP 122/76 (BP Location: Right Arm)   Pulse 90   Temp 98.5 F (36.9 C) (Oral)   Resp (!) 22   Ht 5\' 1"  (1.549 m)   Wt 68.9 kg   LMP 06/26/2021 (Exact Date)   SpO2 100%   BMI 28.72 kg/m   Physical Exam Vitals and nursing note reviewed. Exam conducted with a chaperone present.  Constitutional:      General: She is not in acute distress.    Appearance: Normal appearance.  HENT:     Head: Normocephalic and atraumatic.     Mouth/Throat:     Mouth: Mucous membranes are moist.     Pharynx: No oropharyngeal exudate or posterior oropharyngeal erythema.     Comments: No sublingual tenderness or swelling. Uvula is midline. No trismus. No dry sockets or pulp exposure. Handling secretions without difficulty. No trismus.  Palpable dental abscess appreciated to the left upper jaw.  No exudate expressed with gentle pressure. Eyes:     General: No scleral icterus.    Extraocular Movements: Extraocular movements intact.     Pupils: Pupils are equal, round, and reactive to light.  Cardiovascular:     Rate and Rhythm: Normal rate and regular rhythm.  Pulmonary:     Effort: Pulmonary effort is normal.     Breath sounds: Normal breath sounds.  Abdominal:     General: Abdomen is flat.     Tenderness: There is no abdominal tenderness.  Genitourinary:    Comments: Scant opaque discharge noted in the vaginal canal.  No Bartholin abscess appreciated.  There is some generalized swelling, no  cervical motion tenderness.  No erythema or vaginal bleeding through the cervix.  No adnexal tenderness. Musculoskeletal:     Cervical back: Normal range of motion. No rigidity or tenderness.  Skin:    Coloration: Skin is not jaundiced.     Findings: Rash present.     Comments: Vesicular rash with crusting consistent with herpes virus appreciated to the upper lip.  Neurological:  Mental Status: She is alert. Mental status is at baseline.     Coordination: Coordination normal.  Psychiatric:        Mood and Affect: Mood normal.    ED Results / Procedures / Treatments   Labs (all labs ordered are listed, but only abnormal results are displayed) Labs Reviewed  WET PREP, GENITAL  URINALYSIS, ROUTINE W REFLEX MICROSCOPIC  GC/CHLAMYDIA PROBE AMP ([place]) NOT AT [facility]    EKG None  Radiology No results found.  Procedures Procedures   Medications Ordered in ED Medications  chlorpheniramine-HYDROcodone (TUSSIONEX) 10-8 MG/5ML suspension 5 mL (has no administration in time range)  ketorolac (TORADOL) 30 MG/ML injection 30 mg (has no administration in time range)    ED Course  I have reviewed the triage vital signs and the nursing notes.  Pertinent labs & imaging results that were available during my care of the patient were reviewed by me and considered in my medical decision making (see chart for details).    MDM Rules/Calculators/A&P                           Stable vitals, nontoxic-appearing.  I considered PID but I think it is unlikely given no significant cervical motion tenderness.  Chlamydia and gonorrhea are still pending.  Wet prep does not show any signs of BV or trichomoniasis.  Patient does not have any abdominal tenderness therapy concerning for acute abdomen.  Not having any adnexal tenderness, doubt ovarian torsion.  Doubt ectopic given age, UA is negative for signs of UTI or pyelonephritis.  Does not have findings consistent with nephrolithiasis.  Dental  exam is consistent with dental abscess.  Do not suspect Ludewig angina, PTA, retropharyngeal abscess based on the exam.  Patient discharged in good condition.  Started on clindamycin to cover for the dental abscess, but prep negative for any pathology.  GC chlamydia still pending.  Final Clinical Impression(s) / ED Diagnoses Final diagnoses:  None    Rx / DC Orders ED Discharge Orders     None        Theron Arista, [place] 06/27/21 1753    Terrilee Files, MD 06/27/21 1859

## 2021-06-27 NOTE — Discharge Instructions (Addendum)
Take clindamycin every 6 hours until finishing the medicine.  Please stop taking the amoxicillin.

## 2021-06-29 LAB — GC/CHLAMYDIA PROBE AMP (~~LOC~~) NOT AT ARMC
Chlamydia: NEGATIVE
Comment: NEGATIVE
Comment: NORMAL
Neisseria Gonorrhea: NEGATIVE

## 2021-06-30 ENCOUNTER — Telehealth: Payer: Self-pay | Admitting: Licensed Clinical Social Worker

## 2021-06-30 NOTE — Telephone Encounter (Signed)
[facility] reached patient via phone call to offer counseling services, patient hung up after [facility] introduced himself.

## 2021-07-08 DIAGNOSIS — R7303 Prediabetes: Secondary | ICD-10-CM | POA: Insufficient documentation

## 2021-07-23 ENCOUNTER — Ambulatory Visit (HOSPITAL_COMMUNITY): Admission: RE | Admit: 2021-07-23 | Payer: Medicaid Other | Source: Ambulatory Visit

## 2021-07-24 ENCOUNTER — Ambulatory Visit (HOSPITAL_COMMUNITY): Payer: Self-pay

## 2021-08-03 ENCOUNTER — Other Ambulatory Visit: Payer: Self-pay

## 2021-08-03 ENCOUNTER — Emergency Department (HOSPITAL_COMMUNITY)
Admission: EM | Admit: 2021-08-03 | Discharge: 2021-08-03 | Disposition: A | Discharge status: Home / Self Care | Payer: Medicaid Other | Attending: Emergency Medicine | Admitting: Emergency Medicine

## 2021-08-03 ENCOUNTER — Encounter (HOSPITAL_COMMUNITY): Payer: Self-pay | Admitting: *Deleted

## 2021-08-03 DIAGNOSIS — F17210 Nicotine dependence, cigarettes, uncomplicated: Secondary | ICD-10-CM | POA: Insufficient documentation

## 2021-08-03 DIAGNOSIS — K0889 Other specified disorders of teeth and supporting structures: Secondary | ICD-10-CM

## 2021-08-03 DIAGNOSIS — J45909 Unspecified asthma, uncomplicated: Secondary | ICD-10-CM | POA: Insufficient documentation

## 2021-08-03 DIAGNOSIS — N939 Abnormal uterine and vaginal bleeding, unspecified: Secondary | ICD-10-CM

## 2021-08-03 LAB — PREGNANCY, URINE: Preg Test, Ur: NEGATIVE

## 2021-08-03 LAB — URINALYSIS, ROUTINE W REFLEX MICROSCOPIC
Bilirubin Urine: NEGATIVE
Glucose, UA: NEGATIVE mg/dL
Ketones, ur: NEGATIVE mg/dL
Leukocytes,Ua: NEGATIVE
Nitrite: NEGATIVE
Protein, ur: NEGATIVE mg/dL
Specific Gravity, Urine: >1.03 — ABNORMAL HIGH (ref 1.005–1.030)
pH: 5.5 (ref 5.0–8.0)

## 2021-08-03 LAB — BASIC METABOLIC PANEL
Anion gap: 8 (ref 5–15)
BUN: 14 mg/dL (ref 6–20)
CO2: 16 mmol/L — ABNORMAL LOW (ref 22–32)
Calcium: 8.5 mg/dL — ABNORMAL LOW (ref 8.9–10.3)
Chloride: 110 mmol/L (ref 98–111)
Creatinine, Ser: 0.52 mg/dL (ref 0.44–1.00)
GFR, Estimated: >60 mL/min (ref >60)
Glucose, Bld: 132 mg/dL — ABNORMAL HIGH (ref 70–99)
Potassium: 3.9 mmol/L (ref 3.5–5.1)
Sodium: 134 mmol/L — ABNORMAL LOW (ref 135–145)

## 2021-08-03 LAB — CBC
HCT: 35.8 % — ABNORMAL LOW (ref 36.0–46.0)
Hemoglobin: 12.2 g/dL (ref 12.0–15.0)
MCH: 33.8 pg (ref 26.0–34.0)
MCHC: 34.1 g/dL (ref 30.0–36.0)
MCV: 99.2 fL (ref 80.0–100.0)
Platelets: 230 10*3/uL (ref 150–400)
RBC: 3.61 MIL/uL — ABNORMAL LOW (ref 3.87–5.11)
RDW: 12.7 % (ref 11.5–15.5)
WBC: 8.8 10*3/uL (ref 4.0–10.5)
nRBC: 0 % (ref 0.0–0.2)

## 2021-08-03 LAB — URINALYSIS, MICROSCOPIC (REFLEX)

## 2021-08-03 MED ORDER — OXYCODONE-ACETAMINOPHEN 5-325 MG PO TABS: 1.0000 | ORAL_TABLET | Freq: Four times a day (QID) | ORAL | 0 refills | Status: AC | PRN

## 2021-08-03 MED ORDER — HYDROCODONE-ACETAMINOPHEN 5-325 MG PO TABS
1.0000 | ORAL_TABLET | Freq: Once | ORAL | Status: AC
  Administered 2021-08-03: 09:00:00 1 via ORAL
  Filled 2021-08-03: qty 1

## 2021-08-03 NOTE — ED Triage Notes (Signed)
Pt c/o vaginal bleeding x 12 days with lower abdominal pain and left upper and lower dental pain x months.

## 2021-08-03 NOTE — Discharge Instructions (Addendum)
I have sent in a short course of pain medicine for you.  I have also given you referral to dentist and GYN.  Please give them a call today to schedule your follow-up appointments.  If your symptoms worsen please return to the emergency room such as heavier bleeding, lightheadedness, fever, mouth swelling, difficulty swallowing otherwise follow-up with above clinics.  We don't have an on-call dentist today.  I have attached a reference sheet with dentist in the community is that you could call and schedule an appointment with.

## 2021-08-03 NOTE — ED Provider Notes (Signed)
[facility] EMERGENCY DEPARTMENT Provider Note   CSN: [id number] Arrival date & time: 08/03/21  6063     History Chief Complaint  Patient presents with   Vaginal Bleeding   Dental Pain    Susan [name] is a 45 y.o. female.  45 year old female presents today for evaluation of vaginal bleeding and dental pain.  States dental pain has been ongoing now for 2 months and has been unable to get in with a dentist as of yet.  Patient was recently treated with antibiotics and states she completed that course about 1 week ago.  She has been taking Tylenol Motrin without significant relief of the pain.  Denies difficulty swallowing, drainage from the area, fever, chills, or swelling of her mouth.  She reports she was given pain medication last time she was here which helped.  She also reports she has had vaginal bleeding for about 2 weeks.  She reports this is bright red blood and she is going through about 3 pads a day.  She reports this is similar to her menstrual bleeding except this has not gone away.  Patient reports about 2 weeks ago she was evaluated at health department and had a pelvic exam done and was told she possibly has fibroids.  She was evaluated because she had same symptoms at that time.  They ordered an ultrasound but she has not yet completed.  She endorses abdominal cramping which is now been ongoing for few weeks.  She denies vaginal discharge.  She is sexually active with just her husband.  States she also got her Depo shot when she was at the health department.  The history is provided by the patient. No language interpreter was used.      Past Medical History:  Diagnosis Date   Alcohol abuse    Asthma    BV (bacterial vaginosis) 03/06/2013   Chronic dental pain    Chronic pelvic pain in female    Depression    Ectopic pregnancy    GERD (gastroesophageal reflux disease)    Panic attacks    Polysubstance abuse (HCC)    cocaine, opiates, marijuana   Trichomonas vaginitis      Patient Active Problem List   Diagnosis Date Noted   Tobacco use    Cocaine use    Sepsis due to Escherichia coli without acute organ dysfunction (HCC)    Pyelonephritis 10/08/2020   Polysubstance dependence (HCC) 07/27/2019   MDD (major depressive disorder), recurrent episode, severe (HCC) 01/23/2019   PTSD (post-traumatic stress disorder) 06/07/2017   Substance induced mood disorder (HCC) 06/04/2017   Preterm labor 04/10/2017   Asymptomatic bacteriuria during pregnancy in second trimester 03/14/2017   Late prenatal care in second trimester 03/08/2017   Supervision of normal pregnancy 03/08/2017   MDD (major depressive disorder), single episode, severe with psychotic features (HCC) 01/05/2017   Alcohol use disorder, severe, dependence (HCC) 01/05/2017   Cocaine use disorder, moderate, dependence (HCC) 01/05/2017   Cannabis use disorder, moderate, dependence (HCC) 01/05/2017   Heavy smoker 02/03/2016   Cocaine abuse affecting pregnancy, antepartum (HCC) 02/03/2016   Chronic, continuous use of opioids 03/24/2015   Depression with anxiety 03/24/2015   Poor dentition 03/24/2015   Sprain of ankle, unspecified site 05/28/2009    Past Surgical History:  Procedure Laterality Date   ECTOPIC PREGNANCY SURGERY       OB History     Gravida  9   Para  5   Term  4   Preterm  1   AB  4   Living  5      SAB  1   IAB  2   Ectopic  1   Multiple  0   Live Births  5           Family History  Problem Relation Age of Onset   Diabetes Mother    COPD Mother    Stroke Mother    Asthma Sister    Down syndrome Sister    Heart disease Maternal Grandmother    Hypertension Maternal Grandmother    Stroke Maternal Grandfather     Social History   Tobacco Use   Smoking status: Every Day    Packs/day: 2.00    Years: 26.00    Pack years: 52.00    Types: Cigarettes   Smokeless tobacco: Never  Vaping Use   Vaping Use: Some days  Substance Use Topics   Alcohol  use: Yes    Alcohol/week: 2.0 standard drinks    Types: 2 Cans of beer per week    Comment: 2-3 cans of beer every other day   Drug use: Yes    Types: Cocaine    Comment: last week as of 08/03/21    Home Medications Prior to Admission medications   Medication Sig Start Date End Date Taking? Authorizing Provider  amoxicillin (AMOXIL) 500 MG capsule Take 500 mg by mouth 3 (three) times daily.    [provider]  clindamycin (CLEOCIN) 150 MG capsule Take 1 capsule (150 mg total) by mouth every 6 (six) hours. 06/27/21   Theron Arista, PA-C  HYDROcodone-acetaminophen (NORCO/VICODIN) 5-325 MG tablet Take 2 tablets by mouth every 4 (four) hours as needed. 06/27/21   Theron Arista, PA-C  naproxen (NAPROSYN) 500 MG tablet Take 1 tablet (500 mg total) by mouth 2 (two) times daily as needed. With food 06/25/21   Jacquelin Hawking, PA-C  ondansetron (ZOFRAN ODT) 4 MG disintegrating tablet Take 1 tablet (4 mg total) by mouth every 8 (eight) hours as needed for nausea or vomiting. 06/27/21   Theron Arista, PA-C    Allergies    Patient has no known allergies.  Review of Systems   Review of Systems  Constitutional:  Negative for activity change and chills.  HENT:  Positive for dental problem.   Gastrointestinal:  Negative for abdominal distention, nausea and vomiting.  Genitourinary:  Positive for pelvic pain and vaginal bleeding. Negative for difficulty urinating, dysuria, flank pain, frequency, vaginal discharge and vaginal pain.  All other systems reviewed and are negative.  Physical Exam Updated Vital Signs BP 132/62    Pulse 73    Temp 98.7 F (37.1 C) (Oral)    Resp 18    Ht 5\' 1"  (1.549 m)    Wt 70.3 kg    LMP 07/23/2021    SpO2 97%    BMI 29.29 kg/m   Physical Exam Vitals and nursing note reviewed.  Constitutional:      General: She is not in acute distress.    Appearance: Normal appearance. She is not ill-appearing.  HENT:     Head: Normocephalic and atraumatic.     Nose: Nose  normal.  Eyes:     General: No scleral icterus.    Extraocular Movements: Extraocular movements intact.     Conjunctiva/sclera: Conjunctivae normal.  Cardiovascular:     Rate and Rhythm: Normal rate and regular rhythm.     Pulses: Normal pulses.  Pulmonary:     Effort: Pulmonary effort  is normal. No respiratory distress.     Breath sounds: Normal breath sounds. No wheezing or rales.  Abdominal:     General: There is no distension.     Palpations: Abdomen is soft.     Tenderness: There is no abdominal tenderness. There is no guarding.  Musculoskeletal:        General: Normal range of motion.     Cervical back: Normal range of motion.  Skin:    General: Skin is warm and dry.  Neurological:     General: No focal deficit present.     Mental Status: She is alert. Mental status is at baseline.    ED Results / Procedures / Treatments   Labs (all labs ordered are listed, but only abnormal results are displayed) Labs Reviewed  CBC  BASIC METABOLIC PANEL  URINALYSIS, ROUTINE W REFLEX MICROSCOPIC  PREGNANCY, URINE    EKG None  Radiology No results found.  Procedures Procedures   Medications Ordered in ED Medications  HYDROcodone-acetaminophen (NORCO/VICODIN) 5-325 MG per tablet 1 tablet (has no administration in time range)    ED Course  I have reviewed the triage vital signs and the nursing notes.  Pertinent labs & imaging results that were available during my care of the patient were reviewed by me and considered in my medical decision making (see chart for details).    MDM Rules/Calculators/A&P                         45 year old female presents today for evaluation of dental pain of 66-month duration.  Patient was recently treated with antibiotics.  At the time she had swelling.  Since taking antibiotics her swelling has improved.  She has not been able to get in with a dentist.  Exam today is reassuring and without concern for abscess, Ludwig angina, PTA.  We will  give short course of pain medicine and give dental referral.  Discussed importance of follow-up with dentist.  Patient's vaginal bleeding has been ongoing now for 2 weeks was recently evaluated at health department and told she likely has fibroid pelvic ultrasound which she has not yet obtained.  Abdominal exam is benign.  Hemoglobin is stable.  She is without leukocytosis.  She denies lightheadedness.  Given she had recent pelvic exam done at health department there was concern for fibroids.  Her symptoms have not changed and her hemoglobin is stable.  We will defer GU exam and refer patient to GYN.  Discussed that she should obtain her previously ordered ultrasound.  Discussion can take over-the-counter ibuprofen for abdominal cramping.  Patient voices understanding and is in agreement with plan.  Discussed return precautions including worsening bleeding, fever, developing lightheadedness, or worsening dental pain swallowing, difficulty swallowing. Case discussed with Dr. Effie Shy who is in agreement with plan.     Final Clinical Impression(s) / ED Diagnoses Final diagnoses:  Vaginal bleeding  Pain, dental    Rx / DC Orders ED Discharge Orders          Ordered    oxyCODONE-acetaminophen (PERCOCET/ROXICET) 5-325 MG tablet  Every 6 hours PRN        08/03/21 1034             Marita [place], PA-C 08/03/21 1037    Mancel Bale, MD 08/03/21 1909

## 2021-08-07 ENCOUNTER — Other Ambulatory Visit: Payer: Self-pay

## 2021-08-07 ENCOUNTER — Ambulatory Visit (HOSPITAL_COMMUNITY)
Admission: RE | Admit: 2021-08-07 | Discharge: 2021-08-07 | Disposition: A | Discharge status: Home / Self Care | Payer: Self-pay | Source: Ambulatory Visit | Attending: Nurse Practitioner | Admitting: Nurse Practitioner

## 2021-08-07 DIAGNOSIS — N920 Excessive and frequent menstruation with regular cycle: Secondary | ICD-10-CM | POA: Insufficient documentation

## 2021-08-07 DIAGNOSIS — R102 Pelvic and perineal pain: Secondary | ICD-10-CM | POA: Insufficient documentation

## 2021-08-07 DIAGNOSIS — N852 Hypertrophy of uterus: Secondary | ICD-10-CM | POA: Insufficient documentation

## 2021-09-14 ENCOUNTER — Other Ambulatory Visit: Payer: Self-pay | Admitting: Physician Assistant

## 2021-09-14 DIAGNOSIS — E781 Pure hyperglyceridemia: Secondary | ICD-10-CM

## 2021-09-14 DIAGNOSIS — R7309 Other abnormal glucose: Secondary | ICD-10-CM

## 2021-09-21 ENCOUNTER — Ambulatory Visit: Payer: Medicaid Other | Admitting: Physician Assistant

## 2021-09-22 ENCOUNTER — Ambulatory Visit: Payer: Medicaid Other | Admitting: Physician Assistant

## 2021-10-27 ENCOUNTER — Emergency Department (HOSPITAL_COMMUNITY)
Admission: EM | Admit: 2021-10-27 | Discharge: 2021-10-27 | Disposition: A | Discharge status: Home / Self Care | Payer: Medicaid Other | Attending: Emergency Medicine | Admitting: Emergency Medicine

## 2021-10-27 ENCOUNTER — Other Ambulatory Visit: Payer: Self-pay

## 2021-10-27 ENCOUNTER — Encounter (HOSPITAL_COMMUNITY): Payer: Self-pay | Admitting: *Deleted

## 2021-10-27 DIAGNOSIS — K0889 Other specified disorders of teeth and supporting structures: Secondary | ICD-10-CM

## 2021-10-27 DIAGNOSIS — K029 Dental caries, unspecified: Secondary | ICD-10-CM | POA: Insufficient documentation

## 2021-10-27 MED ORDER — HYDROCODONE-ACETAMINOPHEN 5-325 MG PO TABS: ORAL_TABLET | ORAL | 0 refills | Status: DC

## 2021-10-27 MED ORDER — NAPROXEN 500 MG PO TABS: 500.0000 mg | ORAL_TABLET | Freq: Two times a day (BID) | ORAL | 0 refills | Status: DC

## 2021-10-27 NOTE — Discharge Instructions (Signed)
Be sure to get your penicillin filled today and begin taking as directed.  Keep your follow-up appointment with your dentist for recheck. ?

## 2021-10-27 NOTE — ED Triage Notes (Signed)
Requesting med for dental pain, states she has a prescription for Penicillin and has not picked it up yet ?

## 2021-10-27 NOTE — ED Provider Notes (Signed)
?[facility] EMERGENCY DEPARTMENT ?Provider Note ? ? ?CSN: [id number] ?Arrival date & time: 10/27/21  1058 ? ?  ? ?History ? ?Chief Complaint  ?Patient presents with  ? Dental Pain  ? ? ?Susan [name] is a 46 y.o. female. ? ? ?Dental Pain ?Associated symptoms: no facial swelling   ? ?  ? ?Susan [name] is a 46 y.o. female who presents to the Emergency Department complaining of persisting dental pain.  Pain has been present for several days to weeks.  She was seen by her dentist earlier today and has multiple dental caries and developing abscess on the left.  She was prescribed penicillin which she has not picked up yet.  She is here for pain control and stating that she has a follow-up appointment with her dentist for extraction.  She has paperwork with her to verify this.  She is having left facial pain that radiates to her ear.  She denies any difficulty swallowing, fever, chills, facial swelling or neck pain.  No difficulty opening and closing her mouth. ? ?Home Medications ?Prior to Admission medications   ?Medication Sig Start Date End Date Taking? Authorizing Provider  ?clindamycin (CLEOCIN) 150 MG capsule Take 1 capsule (150 mg total) by mouth every 6 (six) hours. ?Patient not taking: Reported on 08/03/2021 06/27/21   Theron Arista, PA-C  ?naproxen (NAPROSYN) 500 MG tablet Take 1 tablet (500 mg total) by mouth 2 (two) times daily as needed. With food ?Patient not taking: Reported on 08/03/2021 06/25/21   Jacquelin Hawking, PA-C  ?ondansetron (ZOFRAN ODT) 4 MG disintegrating tablet Take 1 tablet (4 mg total) by mouth every 8 (eight) hours as needed for nausea or vomiting. ?Patient not taking: Reported on 08/03/2021 06/27/21   Theron Arista, PA-C  ?   ? ?Allergies    ?Patient has no known allergies.   ? ?Review of Systems   ?Review of Systems  ?HENT:  Positive for dental problem and ear pain. Negative for facial swelling, sore throat and trouble swallowing.   ?All other systems reviewed and are negative. ? ?Physical  Exam ?Updated Vital Signs ?BP 111/62   Pulse 67   Temp 98.2 ?F (36.8 ?C) (Oral)   Resp 18   SpO2 100%  ?Physical Exam ?Vitals and nursing note reviewed.  ?Constitutional:   ?   General: She is not in acute distress. ?   Appearance: Normal appearance. She is not toxic-appearing.  ?HENT:  ?   Head: Atraumatic.  ?   Right Ear: Tympanic membrane and ear canal normal.  ?   Left Ear: Tympanic membrane and ear canal normal.  ?   Mouth/Throat:  ?   Mouth: Mucous membranes are moist.  ?   Pharynx: Oropharynx is clear.  ?   Comments: Multiple dental caries and tenderness of the left lower existing molars, left upper bicuspid and right upper bicuspid.  No erythema or edema of the gingiva.  Uvula midline nonedematous.  No trismus.  No sublingual abnormality.  Patient is partially edentulous. ?Cardiovascular:  ?   Rate and Rhythm: Normal rate and regular rhythm.  ?   Pulses: Normal pulses.  ?Pulmonary:  ?   Effort: Pulmonary effort is normal.  ?   Breath sounds: Normal breath sounds.  ?Musculoskeletal:     ?   General: Normal range of motion.  ?Skin: ?   General: Skin is warm.  ?Neurological:  ?   Mental Status: She is alert.  ? ? ?ED Results / Procedures / Treatments   ?  Labs ?(all labs ordered are listed, but only abnormal results are displayed) ?Labs Reviewed - No data to display ? ?EKG ?None ? ?Radiology ?No results found. ? ?Procedures ?Procedures  ? ? ?Medications Ordered in ED ?Medications - No data to display ? ?ED Course/ Medical Decision Making/ A&P ?  ?                        ?Medical Decision Making ?Patient here requesting pain control with multiple dental caries.  Seen earlier today by her dentist and prescription called in for penicillin which she has not picked up yet.  She has paperwork with her confirming dental visit and follow-up recommendation for dental extractions. ? ?Exam, patient well-appearing, nontoxic.  Vital signs are reassuring.  She has multiple dental caries and tenderness on exam without  evidence of abscess.  No concerning symptoms to suggest Ludewig's angina.  Doubt urgent process.  There is no trismus on exam.  I feel that she is appropriate for discharge home.  Advised to take her penicillin as directed.  Short course of pain medication provided, database reviewed. ? ? ? ? ? ? ? ? ? ? ?Final Clinical Impression(s) / ED Diagnoses ?Final diagnoses:  ?Pain, dental  ? ? ?Rx / DC Orders ?ED Discharge Orders   ? ? None  ? ?  ? ? ?  ?Pauline Aus, PA-C ?10/27/21 1202 ? ?  ?Pricilla Loveless, MD ?10/31/21 1451 ? ?

## 2021-11-11 ENCOUNTER — Emergency Department (HOSPITAL_COMMUNITY): Payer: Self-pay

## 2021-11-11 ENCOUNTER — Encounter (HOSPITAL_COMMUNITY): Payer: Self-pay | Admitting: Emergency Medicine

## 2021-11-11 ENCOUNTER — Emergency Department (HOSPITAL_COMMUNITY)
Admission: EM | Admit: 2021-11-11 | Discharge: 2021-11-12 | Disposition: A | Discharge status: Home / Self Care | Payer: Self-pay | Attending: Student | Admitting: Student

## 2021-11-11 ENCOUNTER — Other Ambulatory Visit: Payer: Self-pay

## 2021-11-11 DIAGNOSIS — K029 Dental caries, unspecified: Secondary | ICD-10-CM

## 2021-11-11 DIAGNOSIS — F17210 Nicotine dependence, cigarettes, uncomplicated: Secondary | ICD-10-CM | POA: Insufficient documentation

## 2021-11-11 DIAGNOSIS — R220 Localized swelling, mass and lump, head: Secondary | ICD-10-CM | POA: Insufficient documentation

## 2021-11-11 DIAGNOSIS — J45909 Unspecified asthma, uncomplicated: Secondary | ICD-10-CM | POA: Insufficient documentation

## 2021-11-11 DIAGNOSIS — K047 Periapical abscess without sinus: Secondary | ICD-10-CM | POA: Insufficient documentation

## 2021-11-11 LAB — I-STAT CHEM 8, ED
BUN: 6 mg/dL (ref 6–20)
Calcium, Ion: 1.17 mmol/L (ref 1.15–1.40)
Chloride: 100 mmol/L (ref 98–111)
Creatinine, Ser: 0.4 mg/dL — ABNORMAL LOW (ref 0.44–1.00)
Glucose, Bld: 119 mg/dL — ABNORMAL HIGH (ref 70–99)
HCT: 41 % (ref 36.0–46.0)
Hemoglobin: 13.9 g/dL (ref 12.0–15.0)
Potassium: 3.6 mmol/L (ref 3.5–5.1)
Sodium: 138 mmol/L (ref 135–145)
TCO2: 28 mmol/L (ref 22–32)

## 2021-11-11 MED ORDER — KETOROLAC TROMETHAMINE 15 MG/ML IJ SOLN
15.0000 mg | Freq: Once | INTRAMUSCULAR | Status: AC
  Administered 2021-11-12: 15 mg via INTRAVENOUS
  Filled 2021-11-11: qty 1

## 2021-11-11 MED ORDER — MORPHINE SULFATE (PF) 4 MG/ML IV SOLN
4.0000 mg | Freq: Once | INTRAVENOUS | Status: AC
  Administered 2021-11-12: 4 mg via INTRAVENOUS
  Filled 2021-11-11: qty 1

## 2021-11-11 MED ORDER — ONDANSETRON HCL 4 MG/2ML IJ SOLN
4.0000 mg | Freq: Once | INTRAMUSCULAR | Status: AC
  Administered 2021-11-12: 4 mg via INTRAVENOUS
  Filled 2021-11-11: qty 2

## 2021-11-11 NOTE — ED Provider Notes (Signed)
?[facility] ?Provider Note ? ?CSN: [id number] ?Arrival date & time: 11/11/21 2222 ? ?Chief Complaint(s) ?Dental Pain ? ?HPI ?[name] is a 46 y.o. female with PMH alcohol abuse, polysubstance abuse, tobacco abuse, multiple ER presentations for dental pain starting in November 2022 who presents emergency department for evaluation of dental pain.  Patient last seen on 10/27/2021 and states that she is scheduled for a mandibular tooth removal in 2 weeks.  She states that she has been taking her prescribed penicillin, Naprosyn and Norco and not having pain relief.  She states that she now has an increased swelling at the angle of the left lower mandible and is having trouble opening her mouth and swallowing.  She denies fever, chest pain, shortness of breath, headache, vomiting or other systemic symptoms. ? ? ?Dental Pain ?Associated symptoms: facial swelling   ? ?Past Medical History ?Past Medical History:  ?Diagnosis Date  ? Alcohol abuse   ? Asthma   ? BV (bacterial vaginosis) 03/06/2013  ? Chronic dental pain   ? Chronic pelvic pain in female   ? Depression   ? Ectopic pregnancy   ? GERD (gastroesophageal reflux disease)   ? Panic attacks   ? Polysubstance abuse (HCC)   ? cocaine, opiates, marijuana  ? Trichomonas vaginitis   ? ?Patient Active Problem List  ? Diagnosis Date Noted  ? Tobacco use   ? Cocaine use   ? Sepsis due to Escherichia coli without acute organ dysfunction (HCC)   ? Pyelonephritis 10/08/2020  ? Polysubstance dependence (HCC) 07/27/2019  ? MDD (major depressive disorder), recurrent episode, severe (HCC) 01/23/2019  ? PTSD (post-traumatic stress disorder) 06/07/2017  ? Substance induced mood disorder (HCC) 06/04/2017  ? Preterm labor 04/10/2017  ? Asymptomatic bacteriuria during pregnancy in second trimester 03/14/2017  ? Late prenatal care in second trimester 03/08/2017  ? Supervision of normal pregnancy 03/08/2017  ? MDD (major depressive disorder), single episode, severe  with psychotic features (HCC) 01/05/2017  ? Alcohol use disorder, severe, dependence (HCC) 01/05/2017  ? Cocaine use disorder, moderate, dependence (HCC) 01/05/2017  ? Cannabis use disorder, moderate, dependence (HCC) 01/05/2017  ? Heavy smoker 02/03/2016  ? Cocaine abuse affecting pregnancy, antepartum (HCC) 02/03/2016  ? Chronic, continuous use of opioids 03/24/2015  ? Depression with anxiety 03/24/2015  ? Poor dentition 03/24/2015  ? Sprain of ankle, unspecified site 05/28/2009  ? ?Home Medication(s) ?Prior to Admission medications   ?Medication Sig Start Date End Date Taking? Authorizing Provider  ?amoxicillin-clavulanate (AUGMENTIN) 875-125 MG tablet Take 1 tablet by mouth every 12 (twelve) hours. 11/12/21  Yes Argie Lober, MD  ?naproxen (NAPROSYN) 375 MG tablet Take 1 tablet (375 mg total) by mouth 2 (two) times daily. 11/12/21  Yes Inaki Vantine, MD  ?oxyCODONE-acetaminophen (PERCOCET/ROXICET) 5-325 MG tablet Take 1 tablet by mouth every 6 (six) hours as needed for severe pain. 11/12/21  Yes Rylan Kaufmann, MD  ?clindamycin (CLEOCIN) 150 MG capsule Take 1 capsule (150 mg total) by mouth every 6 (six) hours. ?Patient not taking: Reported on 08/03/2021 06/27/21   Theron Arista, PA-C  ?HYDROcodone-acetaminophen (NORCO/VICODIN) 5-325 MG tablet Take one tab po q 4 hrs prn pain 10/27/21   Triplett, Tammy, PA-C  ?naproxen (NAPROSYN) 500 MG tablet Take 1 tablet (500 mg total) by mouth 2 (two) times daily. Take with food 10/27/21   Triplett, Tammy, PA-C  ?ondansetron (ZOFRAN ODT) 4 MG disintegrating tablet Take 1 tablet (4 mg total) by mouth every 8 (eight) hours as needed for nausea  or vomiting. ?Patient not taking: Reported on 08/03/2021 06/27/21   Theron Arista, PA-C  ?                                                                                                                                  ?Past Surgical History ?Past Surgical History:  ?Procedure Laterality Date  ? ECTOPIC PREGNANCY SURGERY    ? ?Family  History ?Family History  ?Problem Relation Age of Onset  ? Diabetes Mother   ? COPD Mother   ? Stroke Mother   ? Asthma Sister   ? Down syndrome Sister   ? Heart disease Maternal Grandmother   ? Hypertension Maternal Grandmother   ? Stroke Maternal Grandfather   ? ? ?Social History ?Social History  ? ?Tobacco Use  ? Smoking status: Every Day  ?  Packs/day: 2.00  ?  Years: 26.00  ?  Pack years: 52.00  ?  Types: Cigarettes  ? Smokeless tobacco: Never  ?Vaping Use  ? Vaping Use: Some days  ?Substance Use Topics  ? Alcohol use: Yes  ?  Alcohol/week: 2.0 standard drinks  ?  Types: 2 Cans of beer per week  ?  Comment: 2-3 cans of beer every other day  ? Drug use: Yes  ?  Types: Cocaine  ?  Comment: last week as of 08/03/21  ? ?Allergies ?Patient has no known allergies. ? ?Review of Systems ?Review of Systems  ?HENT:  Positive for dental problem and facial swelling.   ? ?Physical Exam ?Vital Signs  ?I have reviewed the triage vital signs ?BP 93/65   Pulse 99   Temp 98.2 ?F (36.8 ?C) (Oral)   Resp 18   Ht 5\' 1"  (1.549 m)   Wt 70.3 kg   SpO2 100%   BMI 29.29 kg/m?  ? ?Physical Exam ?Vitals and nursing note reviewed.  ?Constitutional:   ?   General: She is not in acute distress. ?   Appearance: She is well-developed.  ?HENT:  ?   Head: Normocephalic and atraumatic.  ?   Comments: Tender swelling to the angle of mandible, submental swelling ?   Mouth/Throat:  ?   Comments: Multiple dental caries tooth 18 19 20  ?Eyes:  ?   Conjunctiva/sclera: Conjunctivae normal.  ?Cardiovascular:  ?   Rate and Rhythm: Normal rate and regular rhythm.  ?   Heart sounds: No murmur heard. ?Pulmonary:  ?   Effort: Pulmonary effort is normal. No respiratory distress.  ?   Breath sounds: Normal breath sounds.  ?Abdominal:  ?   Palpations: Abdomen is soft.  ?   Tenderness: There is no abdominal tenderness.  ?Musculoskeletal:     ?   General: No swelling.  ?   Cervical back: Neck supple.  ?Skin: ?   General: Skin is warm and dry.  ?   Capillary  Refill: Capillary refill takes less than 2 seconds.  ?Neurological:  ?  Mental Status: She is alert.  ?Psychiatric:     ?   Mood and Affect: Mood normal.  ? ? ?ED Results and Treatments ?Labs ?(all labs ordered are listed, but only abnormal results are displayed) ?Labs Reviewed  ?I-STAT CHEM 8, ED - Abnormal; Notable for the following components:  ?    Result Value  ? Creatinine, Ser 0.40 (*)   ? Glucose, Bld 119 (*)   ? All other components within normal limits  ?                                                                                                                       ? ?Radiology ?CT Maxillofacial W Contrast ? ?Result Date: 11/12/2021 ?CLINICAL DATA:  Dental pain EXAM: CT MAXILLOFACIAL WITH CONTRAST TECHNIQUE: Multidetector CT imaging of the maxillofacial structures was performed with intravenous contrast. Multiplanar CT image reconstructions were also generated. RADIATION DOSE REDUCTION: This exam was performed according to the departmental dose-optimization program which includes automated exposure control, adjustment of the mA and/or kV according to patient size and/or use of iterative reconstruction technique. CONTRAST:  75mL OMNIPAQUE IOHEXOL 300 MG/ML  SOLN COMPARISON:  None. FINDINGS: Osseous: No facial fracture. Poor dentition with multiple large caries. No subperiosteal abscess. Orbits: Negative. No traumatic or inflammatory finding. Sinuses: Clear. Soft tissues: Negative. Limited intracranial: No significant or unexpected finding. IMPRESSION: Poor dentition with multiple large caries. No subperiosteal abscess. Electronically Signed   By: Deatra RobinsonKevin  Herman M.D.   On: 11/12/2021 01:05   ? ?Pertinent labs & imaging results that were available during my care of the patient were reviewed by me and considered in my medical decision making (see MDM for details). ? ?Medications Ordered in ED ?Medications  ?ketorolac (TORADOL) 15 MG/ML injection 15 mg (15 mg Intravenous Given 11/12/21 0009)  ?morphine (PF) 4  MG/ML injection 4 mg (4 mg Intravenous Given 11/12/21 0010)  ?ondansetron [facility](ZOFRAN) injection 4 mg (4 mg Intravenous Given 11/12/21 0008)  ?iohexol (OMNIPAQUE) 300 MG/ML solution 75 mL (75 mLs Intravenous Contrast Given 4

## 2021-11-11 NOTE — ED Triage Notes (Signed)
Pt c/o increased dental pain over the last 3 days.  ?

## 2021-11-12 MED ORDER — AMOXICILLIN-POT CLAVULANATE 875-125 MG PO TABS: 1.0000 | ORAL_TABLET | Freq: Two times a day (BID) | ORAL | 0 refills | Status: DC

## 2021-11-12 MED ORDER — AMOXICILLIN-POT CLAVULANATE 875-125 MG PO TABS
1.0000 | ORAL_TABLET | Freq: Once | ORAL | Status: AC
  Administered 2021-11-12: 1 via ORAL
  Filled 2021-11-12: qty 1

## 2021-11-12 MED ORDER — OXYCODONE-ACETAMINOPHEN 5-325 MG PO TABS
1.0000 | ORAL_TABLET | Freq: Four times a day (QID) | ORAL | 0 refills | Status: DC | PRN
Start: 2021-11-12 — End: 2022-03-03

## 2021-11-12 MED ORDER — IOHEXOL 300 MG/ML  SOLN
75.0000 mL | Freq: Once | INTRAMUSCULAR | Status: AC | PRN
  Administered 2021-11-12: 75 mL via INTRAVENOUS

## 2021-11-12 MED ORDER — NAPROXEN 375 MG PO TABS: 375.0000 mg | ORAL_TABLET | Freq: Two times a day (BID) | ORAL | 0 refills | Status: DC

## 2021-12-22 NOTE — Congregational Nurse Program (Signed)
Patient presented to onsite [facility] for return visit and received 50.3 lbs of food for family of  3 ?

## 2022-01-12 ENCOUNTER — Ambulatory Visit: Payer: Self-pay | Admitting: Orthopaedic Surgery

## 2022-01-12 ENCOUNTER — Encounter: Payer: Self-pay | Admitting: Orthopaedic Surgery

## 2022-03-03 ENCOUNTER — Ambulatory Visit (INDEPENDENT_AMBULATORY_CARE_PROVIDER_SITE_OTHER): Payer: Medicaid Other

## 2022-03-03 ENCOUNTER — Ambulatory Visit: Payer: Medicaid Other | Admitting: Orthopaedic Surgery

## 2022-03-03 ENCOUNTER — Encounter: Payer: Self-pay | Admitting: Orthopaedic Surgery

## 2022-03-03 VITALS — BP 160/129 | HR 83 | Ht 61.0 in | Wt 151.6 lb

## 2022-03-03 DIAGNOSIS — G8929 Other chronic pain: Secondary | ICD-10-CM | POA: Diagnosis not present

## 2022-03-03 DIAGNOSIS — M25521 Pain in right elbow: Secondary | ICD-10-CM | POA: Diagnosis not present

## 2022-03-03 DIAGNOSIS — M7712 Lateral epicondylitis, left elbow: Secondary | ICD-10-CM

## 2022-03-03 DIAGNOSIS — M7711 Lateral epicondylitis, right elbow: Secondary | ICD-10-CM

## 2022-03-03 MED ORDER — HYDROCODONE-ACETAMINOPHEN 5-325 MG PO TABS: 1.0000 | ORAL_TABLET | ORAL | 0 refills | Status: AC | PRN

## 2022-03-03 MED ORDER — NAPROXEN 500 MG PO TABS: 500.0000 mg | ORAL_TABLET | Freq: Two times a day (BID) | ORAL | 5 refills | Status: DC

## 2022-03-03 NOTE — Progress Notes (Signed)
Subjective:    Patient ID: Susan [name], female    DOB: 11/21/1975, 46 y.o.   MRN: [id number]  HPI She has had right elbow pain over the lateral elbow for several months, worse over the last month.  She picked up a heavy object several months ago and the pain has remained.  She has no trauma, no swelling, no redness.  She has tried ice, heat, Advil with no help.  It hurts more to lift something with the right elbow. She has full motion but it hurts.  She has no numbness.   Review of Systems  Constitutional:  Positive for activity change.  Musculoskeletal:  Positive for arthralgias and myalgias.  Psychiatric/Behavioral:  Dysphoric mood: .wkov.   All other systems reviewed and are negative. For Review of Systems, all other systems reviewed and are negative.  The following is a summary of the past history medically, past history surgically, known current medicines, social history and family history.  This information is gathered electronically by the computer from prior information and documentation.  I review this each visit and have found including this information at this point in the chart is beneficial and informative.   Past Medical History:  Diagnosis Date   Alcohol abuse    Asthma    BV (bacterial vaginosis) 03/06/2013   Chronic dental pain    Chronic pelvic pain in female    Depression    Ectopic pregnancy    GERD (gastroesophageal reflux disease)    Panic attacks    Polysubstance abuse (HCC)    cocaine, opiates, marijuana   Trichomonas vaginitis     Past Surgical History:  Procedure Laterality Date   ECTOPIC PREGNANCY SURGERY      Current Outpatient Medications on File Prior to Visit  Medication Sig Dispense Refill   amoxicillin-clavulanate (AUGMENTIN) 875-125 MG tablet Take 1 tablet by mouth every 12 (twelve) hours. (Patient not taking: Reported on 03/03/2022) 14 tablet 0   clindamycin (CLEOCIN) 150 MG capsule Take 1 capsule (150 mg total) by mouth every 6 (six)  hours. (Patient not taking: Reported on 08/03/2021) 28 capsule 0   ondansetron (ZOFRAN ODT) 4 MG disintegrating tablet Take 1 tablet (4 mg total) by mouth every 8 (eight) hours as needed for nausea or vomiting. (Patient not taking: Reported on 08/03/2021) 20 tablet 0   oxyCODONE-acetaminophen (PERCOCET/ROXICET) 5-325 MG tablet Take 1 tablet by mouth every 6 (six) hours as needed for severe pain. (Patient not taking: Reported on 03/03/2022) 12 tablet 0   No current facility-administered medications on file prior to visit.    Social History   Socioeconomic History   Marital status: Single    Spouse name: Not on file   Number of children: Not on file   Years of education: Not on file   Highest education level: Not on file  Occupational History   Not on file  Tobacco Use   Smoking status: Every Day    Packs/day: 2.00    Years: 26.00    Total pack years: 52.00    Types: Cigarettes   Smokeless tobacco: Never  Vaping Use   Vaping Use: Some days  Substance and Sexual Activity   Alcohol use: Yes    Alcohol/week: 2.0 standard drinks of alcohol    Types: 2 Cans of beer per week    Comment: 2-3 cans of beer every other day   Drug use: Yes    Types: Cocaine    Comment: last week as of 08/03/21  Sexual activity: Yes    Birth control/protection: None  Other Topics Concern   Not on file  Social History Narrative   Not on file   Social Determinants of Health   Financial Resource Strain: Not on file  Food Insecurity: Not on file  Transportation Needs: Not on file  Physical Activity: Not on file  Stress: Not on file  Social Connections: Not on file  Intimate Partner Violence: Not on file    Family History  Problem Relation Age of Onset   Diabetes Mother    COPD Mother    Stroke Mother    Asthma Sister    Down syndrome Sister    Heart disease Maternal Grandmother    Hypertension Maternal Grandmother    Stroke Maternal Grandfather     BP (!) 160/129   Pulse 83   Ht 5\' 1"   (1.549 m)   Wt 151 lb 9.6 oz (68.8 kg)   BMI 28.64 kg/m   Body mass index is 28.64 kg/m.      Objective:   Physical Exam Vitals and nursing note reviewed. Exam conducted with a chaperone present.  Constitutional:      Appearance: She is well-developed.  HENT:     Head: Normocephalic and atraumatic.  Eyes:     Conjunctiva/sclera: Conjunctivae normal.     Pupils: Pupils are equal, round, and reactive to light.  Cardiovascular:     Rate and Rhythm: Normal rate and regular rhythm.  Pulmonary:     Effort: Pulmonary effort is normal.  Abdominal:     Palpations: Abdomen is soft.  Musculoskeletal:       Arms:     Cervical back: Normal range of motion and neck supple.  Skin:    General: Skin is warm and dry.  Neurological:     Mental Status: She is alert and oriented to person, place, and time.     Cranial Nerves: No cranial nerve deficit.     Motor: No abnormal muscle tone.     Coordination: Coordination normal.     Deep Tendon Reflexes: Reflexes are normal and symmetric. Reflexes normal.  Psychiatric:        Behavior: Behavior normal.        Thought Content: Thought content normal.        Judgment: Judgment normal.    X-rays of right elbow were done, reported separately.       Assessment & Plan:   Encounter Diagnoses  Name Primary?   Chronic pain of right elbow Yes   Lateral epicondylitis, left elbow    I have told her about ice massage.  I will give pain medicine.  I will give Naprosyn.  Return in two weeks.  I have told her this will take some time to resolve.  Call if any problem.  Precautions discussed.  Electronically Signed , MD 7/26/202311:43 AM

## 2022-03-17 ENCOUNTER — Ambulatory Visit: Payer: Medicaid Other | Admitting: Orthopaedic Surgery

## 2022-03-17 ENCOUNTER — Telehealth: Payer: Self-pay

## 2022-03-17 NOTE — Telephone Encounter (Signed)
Hydrocodone-Acetaminophen 5/325 MG Qty 30 Tablets  PATIENT USES [facility]

## 2022-03-23 ENCOUNTER — Ambulatory Visit (INDEPENDENT_AMBULATORY_CARE_PROVIDER_SITE_OTHER): Payer: Medicaid Other

## 2022-03-23 ENCOUNTER — Ambulatory Visit: Payer: Medicaid Other | Admitting: Orthopaedic Surgery

## 2022-03-23 ENCOUNTER — Encounter: Payer: Self-pay | Admitting: Orthopaedic Surgery

## 2022-03-23 VITALS — BP 141/72 | HR 80 | Ht 61.0 in | Wt 151.0 lb

## 2022-03-23 DIAGNOSIS — M545 Low back pain, unspecified: Secondary | ICD-10-CM | POA: Diagnosis not present

## 2022-03-23 MED ORDER — HYDROCODONE-ACETAMINOPHEN 5-325 MG PO TABS: ORAL_TABLET | ORAL | 0 refills | Status: DC

## 2022-03-23 NOTE — Patient Instructions (Signed)
Physical therapy has been ordered for you at [name] They should call you to schedule, [phone] is the phone number to call, if you want to call to schedule.   

## 2022-03-23 NOTE — Progress Notes (Signed)
My back has hurt for years.  She has long history of lower back pain.  She has no trauma.  She has no weakness.  She is on Naprosyn for her elbow now.  She recently got Medicaid and is seeking care.  She says it hurts in the mid lower back most all of the time. She has no paresthesias or radiation.  She has no redness or swelling. She has no bowel or bladder problems. She has taken pain medicine in the past for this.  Spine/Pelvis examination:  Inspection:  Overall, sacoiliac joint benign and hips nontender; without crepitus or defects.   Thoracic spine inspection: Alignment normal without kyphosis present   Lumbar spine inspection:  Alignment  with normal lumbar lordosis, without scoliosis apparent.   Thoracic spine palpation:  without tenderness of spinal processes   Lumbar spine palpation: without tenderness of lumbar area; without tightness of lumbar muscles    Range of Motion:   Lumbar flexion, forward flexion is normal without pain or tenderness    Lumbar extension is full without pain or tenderness   Left lateral bend is normal without pain or tenderness   Right lateral bend is normal without pain or tenderness   Straight leg raising is normal  Strength & tone: normal   Stability overall normal stability  X-rays were done of the lumbar spine, reported separately.  Encounter Diagnosis  Name Primary?   Lumbar pain Yes   I will have her begin PT.  I will refill her medicine for pain.  I have reviewed the [place] Controlled Substance Reporting System web site prior to prescribing narcotic medicine for this patient.  I will see her in one month.  Consider MRI if not improved.  Call if any problem.  Precautions discussed.  Electronically Signed Darreld Mclean, MD 8/15/202310:41 AM

## 2022-03-24 ENCOUNTER — Ambulatory Visit: Payer: Medicaid Other | Admitting: Nurse Practitioner

## 2022-04-06 ENCOUNTER — Encounter: Payer: Self-pay | Admitting: Family Medicine

## 2022-04-06 ENCOUNTER — Ambulatory Visit (INDEPENDENT_AMBULATORY_CARE_PROVIDER_SITE_OTHER): Payer: Medicaid Other | Admitting: Family Medicine

## 2022-04-06 VITALS — BP 124/84 | HR 78 | Ht 61.0 in | Wt 155.0 lb

## 2022-04-06 DIAGNOSIS — R102 Pelvic and perineal pain: Secondary | ICD-10-CM | POA: Diagnosis not present

## 2022-04-06 DIAGNOSIS — E559 Vitamin D deficiency, unspecified: Secondary | ICD-10-CM

## 2022-04-06 DIAGNOSIS — N898 Other specified noninflammatory disorders of vagina: Secondary | ICD-10-CM | POA: Insufficient documentation

## 2022-04-06 DIAGNOSIS — R059 Cough, unspecified: Secondary | ICD-10-CM | POA: Insufficient documentation

## 2022-04-06 DIAGNOSIS — Z113 Encounter for screening for infections with a predominantly sexual mode of transmission: Secondary | ICD-10-CM | POA: Diagnosis not present

## 2022-04-06 DIAGNOSIS — R109 Unspecified abdominal pain: Secondary | ICD-10-CM | POA: Insufficient documentation

## 2022-04-06 DIAGNOSIS — R1030 Lower abdominal pain, unspecified: Secondary | ICD-10-CM

## 2022-04-06 DIAGNOSIS — R7301 Impaired fasting glucose: Secondary | ICD-10-CM | POA: Diagnosis not present

## 2022-04-06 DIAGNOSIS — Z720 Tobacco use: Secondary | ICD-10-CM

## 2022-04-06 LAB — POCT URINALYSIS DIPSTICK
Bilirubin, UA: NEGATIVE
Glucose, UA: NEGATIVE
Ketones, UA: NEGATIVE
Leukocytes, UA: NEGATIVE
Nitrite, UA: NEGATIVE
Protein, UA: NEGATIVE
Spec Grav, UA: 1.025 (ref 1.010–1.025)
Urobilinogen, UA: 0.2 E.U./dL
pH, UA: 5.5 (ref 5.0–8.0)

## 2022-04-06 MED ORDER — PROMETHAZINE-DM 6.25-15 MG/5ML PO SYRP: 1.2500 mL | ORAL_SOLUTION | Freq: Four times a day (QID) | ORAL | 0 refills | Status: DC | PRN

## 2022-04-06 MED ORDER — HYDROCODONE-ACETAMINOPHEN 5-325 MG PO TABS: 1.0000 | ORAL_TABLET | Freq: Four times a day (QID) | ORAL | 0 refills | Status: DC | PRN

## 2022-04-06 NOTE — Progress Notes (Signed)
New Patient Office Visit  Subjective:  Patient ID: Susan [name], female    DOB: August 19, 1975  Age: 46 y.o. MRN: [id number]  CC:  Chief Complaint  Patient presents with   New Patient (Initial Visit)    Establishing care, c/o a prolonged cough she thinks its from smoking but may also be bronchitis she states onset of this began 03/06/22. C/o vaginal discharge. Has pelvic pain from fibroids that were detected last year.     HPI Susan [name] is a 46 y.o. female with past medical history of tobacco use presents for establishing care. Pelvic pain: c/o of pelvic pain, pressure, dysmenorrhea and dyspareunia for 2 years. Rates pain 9/10. She describes pain as sharp and constant. Pain intensified with positional changes.  Reports minimum relief of symptoms with naproxen and tramadol. U/S of the pelvic on 08/07/2021 showed 1.3 cm submucosal fibroid at the central uterine body with 4.8 cm simple left ovarian cyst.   Vaginal discharge: c/o of light yellow tinge mucus discharge after having intercourse a month ago. She denies vulva itching and irritation. She notes that the discharge has a slight odor.   Chronic cough: c/o of a non-productive cough for 1 month. The patient smokes 2 ppd of cigarettes with a 15 years smoking hx. She denies URI symptoms  Tobacco use: The patient smokes 2 ppd of cigarettes with 15 years of smoking hx  Past Medical History:  Diagnosis Date   Alcohol abuse    Asthma    BV (bacterial vaginosis) 03/06/2013   Chronic dental pain    Chronic pelvic pain in female    Depression    Ectopic pregnancy    GERD (gastroesophageal reflux disease)    Panic attacks    Polysubstance abuse (HCC)    cocaine, opiates, marijuana   Trichomonas vaginitis     Past Surgical History:  Procedure Laterality Date   ECTOPIC PREGNANCY SURGERY      Family History  Problem Relation Age of Onset   Diabetes Mother    COPD Mother    Stroke Mother    Asthma Sister    Down syndrome Sister     Heart disease Maternal Grandmother    Hypertension Maternal Grandmother    Stroke Maternal Grandfather     Social History   Socioeconomic History   Marital status: Single    Spouse name: Not on file   Number of children: Not on file   Years of education: Not on file   Highest education level: Not on file  Occupational History   Not on file  Tobacco Use   Smoking status: Every Day    Packs/day: 2.00    Years: 26.00    Total pack years: 52.00    Types: Cigarettes   Smokeless tobacco: Never  Vaping Use   Vaping Use: Some days  Substance and Sexual Activity   Alcohol use: Yes    Alcohol/week: 2.0 standard drinks of alcohol    Types: 2 Cans of beer per week    Comment: 2-3 cans of beer every other day   Drug use: Yes    Types: Cocaine    Comment: last week as of 08/03/21   Sexual activity: Yes    Birth control/protection: None  Other Topics Concern   Not on file  Social History Narrative   Not on file   Social Determinants of Health   Financial Resource Strain: Not on file  Food Insecurity: Not on file  Transportation Needs: Not on  file  Physical Activity: Not on file  Stress: Not on file  Social Connections: Not on file  Intimate Partner Violence: Not on file    ROS Review of Systems  Constitutional:  Negative for fatigue and fever.  HENT:  Negative for congestion, postnasal drip, rhinorrhea, sinus pressure, sinus pain, sneezing and sore throat.   Eyes:  Negative for photophobia, pain and redness.  Respiratory:  Positive for cough.   Cardiovascular:  Negative for chest pain and palpitations.  Gastrointestinal:  Positive for abdominal pain. Negative for abdominal distention, blood in stool, nausea and vomiting.  Endocrine: Negative for polydipsia, polyphagia and polyuria.  Genitourinary:  Positive for flank pain and vaginal discharge. Negative for frequency and urgency.  Musculoskeletal:  Negative for back pain and neck stiffness.  Skin:  Negative for rash  and wound.  Neurological:  Negative for dizziness and headaches.  Hematological:  Does not bruise/bleed easily.  Psychiatric/Behavioral:  Negative for self-injury and suicidal ideas.     Objective:   Today's Vitals: BP 124/84   Pulse 78   Ht 5' 1"  (1.549 m)   Wt 155 lb 0.6 oz (70.3 kg)   SpO2 97%   BMI 29.29 kg/m   Physical Exam HENT:     Head: Normocephalic.     Right Ear: External ear normal.     Left Ear: External ear normal.     Nose: No congestion.     Mouth/Throat:     Mouth: Mucous membranes are moist.  Eyes:     Extraocular Movements: Extraocular movements intact.     Pupils: Pupils are equal, round, and reactive to light.  Cardiovascular:     Rate and Rhythm: Normal rate and regular rhythm.     Pulses: Normal pulses.     Heart sounds: Normal heart sounds.  Pulmonary:     Effort: Pulmonary effort is normal.     Breath sounds: Normal breath sounds.  Abdominal:     General: There is no distension.     Palpations: There is no mass.     Tenderness: There is abdominal tenderness. There is guarding. There is no right CVA tenderness or left CVA tenderness.     Hernia: No hernia is present.  Musculoskeletal:     Cervical back: No rigidity.     Right lower leg: No edema.     Left lower leg: No edema.  Skin:    Findings: No lesion or rash.  Neurological:     Mental Status: She is alert and oriented to person, place, and time.  Psychiatric:     Comments: Normal affect     Assessment & Plan:   Problem List Items Addressed This Visit       Other   Tobacco use    Smokes about 2 pack/day  Asked about quitting: confirms that she currently smokes cigarettes Advise to quit smoking: Educated about QUITTING to reduce the risk of cancer, cardio and cerebrovascular disease. Assess willingness: Unwilling to quit at this time, but is working on cutting back. Assist with counseling and pharmacotherapy: Counseled for 5 minutes and literature provided. Arrange for follow  up: follow up in 3 months and continue to offer help.      Abdominal pain     C/o of pelvic pain, pressure, dysmenorrhea, and dyspareunia for 2 years Rates pain 9/10. She describes pain as sharp and constant Pain intensified with positional changes Reports minimum relief of symptoms with naproxen and tramadol U/S of the pelvic on 08/07/2021 showed  1.3 cm submucosal fibroid at the central uterine body with 4.8 cm simple left ovarian cyst UA is negative for leukocytes and nitrates Will get an u/s of the pelvic to compare to previous studies and changes in the size of the fibroids A short course of Vicodin is ordered for pain management.        Vaginal discharge    c/o of light yellow tinge mucus discharge after having intercourse a month ago She denies vulva itching and irritation. She notes that the discharge has a slight odor Pending NuSwab       Cough in adult    C/o of a non-productive cough for 1 month The patient smokes 2 ppd of cigarettes with 15 years of smoking hx She denies URI symptoms Cough is likely due to patient smoking Encouraged smoking cessation with a trial of Promethazine-DM for cough        Relevant Medications   promethazine-dextromethorphan (PROMETHAZINE-DM) 6.25-15 MG/5ML syrup   Other Visit Diagnoses     Pelvic pain    -  Primary   Relevant Medications   HYDROcodone-acetaminophen (NORCO/VICODIN) 5-325 MG tablet   Other Relevant Orders   POCT urinalysis dipstick (Completed)   US Pelvic Complete With Transvaginal   IFG (impaired fasting glucose)       Relevant Orders   CMP14+EGFR   CBC with Differential/Platelet   TSH + free T4   Lipid Profile   Hemoglobin A1C   Vitamin D deficiency       Relevant Orders   Vitamin D (25 hydroxy)   Screening for STD (sexually transmitted disease)       Relevant Orders   NuSwab Vaginitis Plus (VG+)       Outpatient Encounter Medications as of 04/06/2022  Medication Sig   HYDROcodone-acetaminophen  (NORCO/VICODIN) 5-325 MG tablet Take 1 tablet by mouth every 6 (six) hours as needed for moderate pain.   naproxen (NAPROSYN) 500 MG tablet Take 1 tablet (500 mg total) by mouth 2 (two) times daily with a meal.   promethazine-dextromethorphan (PROMETHAZINE-DM) 6.25-15 MG/5ML syrup Take 1.3 mLs by mouth 4 (four) times daily as needed for cough.   [DISCONTINUED] HYDROcodone-acetaminophen (NORCO/VICODIN) 5-325 MG tablet One tablet every six hours for pain.  Limit 7 days.   No facility-administered encounter medications on file as of 04/06/2022.    Follow-up: Return in about 1 month (around 05/07/2022) for pap smear.   Alvira Monday, FNP

## 2022-04-06 NOTE — Patient Instructions (Addendum)
I appreciate the opportunity to provide care to you today!    Follow up:  1 months for pap smear  Labs: please stop by the lab today to get your blood drawn (CBC, CMP, TSH, Lipid profile, HgA1c, Vit D)  Please stop by [facility] hospital anytime to get an u/s of your pelvic   Please stop by your local pharmacy  to pick up your medications   Please continue to a heart-healthy diet and increase your physical activities. Try to exercise for at least three times a week.      It was a pleasure to see you and I look forward to continuing to work together on your health and well-being. Please do not hesitate to call the office if you need care or have questions about your care.   Have a wonderful day and week. With Gratitude, Gilmore Laroche MSN, FNP-BC

## 2022-04-06 NOTE — Assessment & Plan Note (Signed)
Smokes about 2 pack/day  Asked about quitting: confirms that she currently smokes cigarettes Advise to quit smoking: Educated about QUITTING to reduce the risk of cancer, cardio and cerebrovascular disease. Assess willingness: Unwilling to quit at this time, but is working on cutting back. Assist with counseling and pharmacotherapy: Counseled for 5 minutes and literature provided. Arrange for follow up: follow up in 3 months and continue to offer help.

## 2022-04-06 NOTE — Assessment & Plan Note (Signed)
C/o of a non-productive cough for 1 month The patient smokes 2 ppd of cigarettes with 15 years of smoking hx She denies URI symptoms Cough is likely due to patient smoking Encouraged smoking cessation with a trial of Promethazine-DM for cough

## 2022-04-06 NOTE — Assessment & Plan Note (Signed)
c/o of light yellow tinge mucus discharge after having intercourse a month ago She denies vulva itching and irritation. She notes that the discharge has a slight odor Pending NuSwab

## 2022-04-06 NOTE — Assessment & Plan Note (Signed)
  C/o of pelvic pain, pressure, dysmenorrhea, and dyspareunia for 2 years Rates pain 9/10. She describes pain as sharp and constant Pain intensified with positional changes Reports minimum relief of symptoms with naproxen and tramadol U/S of the pelvic on 08/07/2021 showed 1.3 cm submucosal fibroid at the central uterine body with 4.8 cm simple left ovarian cyst UA is negative for leukocytes and nitrates Will get an u/s of the pelvic to compare to previous studies and changes in the size of the fibroids A short course of Vicodin is ordered for pain management.

## 2022-04-07 NOTE — Progress Notes (Signed)
Please inform the patient that she has prediabetes. Her calcium is slightly low, and I recommend increasing her intake of calcium-rich food such as yogurt, milk, oranges, almonds, and leafy greens.   I recommend increasing physical activities and decreasing her intake of sugary foods.

## 2022-04-08 ENCOUNTER — Ambulatory Visit (HOSPITAL_COMMUNITY): Admission: RE | Admit: 2022-04-08 | Payer: Medicaid Other | Source: Ambulatory Visit

## 2022-04-08 LAB — CBC WITH DIFFERENTIAL/PLATELET
Basophils Absolute: 0.1 10*3/uL (ref 0.0–0.2)
Basos: 1 %
EOS (ABSOLUTE): 0.1 10*3/uL (ref 0.0–0.4)
Eos: 1 %
Hematocrit: 37.6 % (ref 34.0–46.6)
Hemoglobin: 12.8 g/dL (ref 11.1–15.9)
Immature Grans (Abs): 0 10*3/uL (ref 0.0–0.1)
Immature Granulocytes: 0 %
Lymphocytes Absolute: 2.5 10*3/uL (ref 0.7–3.1)
Lymphs: 25 %
MCH: 33.3 pg — ABNORMAL HIGH (ref 26.6–33.0)
MCHC: 34 g/dL (ref 31.5–35.7)
MCV: 98 fL — ABNORMAL HIGH (ref 79–97)
Monocytes Absolute: 0.5 10*3/uL (ref 0.1–0.9)
Monocytes: 5 %
Neutrophils Absolute: 6.8 10*3/uL (ref 1.4–7.0)
Neutrophils: 68 %
Platelets: 249 10*3/uL (ref 150–450)
RBC: 3.84 x10E6/uL (ref 3.77–5.28)
RDW: 12.2 % (ref 11.7–15.4)
WBC: 10 10*3/uL (ref 3.4–10.8)

## 2022-04-08 LAB — LIPID PANEL
Chol/HDL Ratio: 2.4 ratio (ref 0.0–4.4)
Cholesterol, Total: 154 mg/dL (ref 100–199)
HDL: 64 mg/dL (ref >39)
LDL Chol Calc (NIH): 65 mg/dL (ref 0–99)
Triglycerides: 147 mg/dL (ref 0–149)
VLDL Cholesterol Cal: 25 mg/dL (ref 5–40)

## 2022-04-08 LAB — CMP14+EGFR
ALT: 9 IU/L (ref 0–32)
AST: 13 IU/L (ref 0–40)
Albumin/Globulin Ratio: 2 (ref 1.2–2.2)
Albumin: 4 g/dL (ref 3.9–4.9)
Alkaline Phosphatase: 60 IU/L (ref 44–121)
BUN/Creatinine Ratio: 20 (ref 9–23)
BUN: 11 mg/dL (ref 6–24)
Bilirubin Total: 0.2 mg/dL (ref 0.0–1.2)
CO2: 20 mmol/L (ref 20–29)
Calcium: 8.5 mg/dL — ABNORMAL LOW (ref 8.7–10.2)
Chloride: 101 mmol/L (ref 96–106)
Creatinine, Ser: 0.56 mg/dL — ABNORMAL LOW (ref 0.57–1.00)
Globulin, Total: 2 g/dL (ref 1.5–4.5)
Glucose: 115 mg/dL — ABNORMAL HIGH (ref 70–99)
Potassium: 4.2 mmol/L (ref 3.5–5.2)
Sodium: 135 mmol/L (ref 134–144)
Total Protein: 6 g/dL (ref 6.0–8.5)
eGFR: 115 mL/min/{1.73_m2} (ref >59)

## 2022-04-08 LAB — TSH+FREE T4
Free T4: 1.08 ng/dL (ref 0.82–1.77)
TSH: 3.01 u[IU]/mL (ref 0.450–4.500)

## 2022-04-08 LAB — VITAMIN D 25 HYDROXY (VIT D DEFICIENCY, FRACTURES): Vit D, 25-Hydroxy: 26.5 ng/mL — ABNORMAL LOW (ref 30.0–100.0)

## 2022-04-08 LAB — HEMOGLOBIN A1C
Est. average glucose Bld gHb Est-mCnc: 123 mg/dL
Hgb A1c MFr Bld: 5.9 % — ABNORMAL HIGH (ref 4.8–5.6)

## 2022-04-09 ENCOUNTER — Other Ambulatory Visit: Payer: Self-pay | Admitting: Family Medicine

## 2022-04-09 DIAGNOSIS — B9689 Other specified bacterial agents as the cause of diseases classified elsewhere: Secondary | ICD-10-CM

## 2022-04-09 MED ORDER — METRONIDAZOLE 500 MG PO TABS: 500.0000 mg | ORAL_TABLET | Freq: Two times a day (BID) | ORAL | 0 refills | Status: DC

## 2022-04-09 NOTE — Progress Notes (Signed)
Please inform the patient that a prescription for flagyl has been sent to her pharmacy. She has BV. She tested negative for gonorrhea and chlamydia

## 2022-04-10 LAB — NUSWAB VAGINITIS PLUS (VG+)
Atopobium vaginae: HIGH Score — AB
BVAB 2: HIGH Score — AB
Candida albicans, NAA: NEGATIVE
Candida glabrata, NAA: NEGATIVE
Chlamydia trachomatis, NAA: NEGATIVE
Megasphaera 1: HIGH Score — AB
Neisseria gonorrhoeae, NAA: NEGATIVE
Trich vag by NAA: NEGATIVE

## 2022-04-14 ENCOUNTER — Ambulatory Visit (HOSPITAL_COMMUNITY): Payer: Medicaid Other | Attending: Orthopaedic Surgery | Admitting: Physical Therapy

## 2022-04-14 DIAGNOSIS — M5459 Other low back pain: Secondary | ICD-10-CM | POA: Insufficient documentation

## 2022-04-19 ENCOUNTER — Telehealth: Payer: Self-pay | Admitting: Family Medicine

## 2022-04-19 ENCOUNTER — Telehealth: Payer: Self-pay

## 2022-04-19 NOTE — Telephone Encounter (Signed)
Pt informed

## 2022-04-19 NOTE — Telephone Encounter (Signed)
Tried to call pt back unable to reach her.  

## 2022-04-19 NOTE — Telephone Encounter (Signed)
Patient called in for lab results  ?

## 2022-04-19 NOTE — Telephone Encounter (Signed)
Tried calling pt back left vm.

## 2022-04-19 NOTE — Telephone Encounter (Signed)
Patient needs to know lab results. Please contact patient.

## 2022-04-19 NOTE — Telephone Encounter (Signed)
Left a vm.

## 2022-04-19 NOTE — Telephone Encounter (Signed)
Pt returned call for labs 

## 2022-04-20 ENCOUNTER — Ambulatory Visit: Payer: Medicaid Other | Admitting: Orthopaedic Surgery

## 2022-04-21 ENCOUNTER — Ambulatory Visit (HOSPITAL_COMMUNITY): Admission: RE | Admit: 2022-04-21 | Payer: Medicaid Other | Source: Ambulatory Visit

## 2022-04-21 ENCOUNTER — Ambulatory Visit (INDEPENDENT_AMBULATORY_CARE_PROVIDER_SITE_OTHER): Payer: Medicaid Other | Admitting: Orthopaedic Surgery

## 2022-04-21 ENCOUNTER — Encounter: Payer: Self-pay | Admitting: Orthopaedic Surgery

## 2022-04-21 DIAGNOSIS — R102 Pelvic and perineal pain unspecified side: Secondary | ICD-10-CM

## 2022-04-21 DIAGNOSIS — M7701 Medial epicondylitis, right elbow: Secondary | ICD-10-CM | POA: Diagnosis not present

## 2022-04-21 DIAGNOSIS — M545 Low back pain, unspecified: Secondary | ICD-10-CM

## 2022-04-21 MED ORDER — HYDROCODONE-ACETAMINOPHEN 5-325 MG PO TABS: 1.0000 | ORAL_TABLET | Freq: Four times a day (QID) | ORAL | 0 refills | Status: DC | PRN

## 2022-04-21 NOTE — Progress Notes (Signed)
My back still hurts.  She has lower back pain and is not improved. She had a PT appointment but did not have the copay.  She has rescheduled.  She has not gotten any better and has pain most of the time.  She has pain that radiates at time to the right foot.  She also has pain of the right medial epicondyle area.  It hurts to lift things.  She has not improved.  Right elbow has pain over the medial epicondyle with pain to resisted dorsiflexion.  NV intact.  No redness.  Spine/Pelvis examination:  Inspection:  Overall, sacoiliac joint benign and hips nontender; without crepitus or defects.   Thoracic spine inspection: Alignment normal without kyphosis present   Lumbar spine inspection:  Alignment  with normal lumbar lordosis, without scoliosis apparent.   Thoracic spine palpation:  without tenderness of spinal processes   Lumbar spine palpation: without tenderness of lumbar area; without tightness of lumbar muscles    Range of Motion:   Lumbar flexion, forward flexion is normal without pain or tenderness    Lumbar extension is full without pain or tenderness   Left lateral bend is normal without pain or tenderness   Right lateral bend is normal without pain or tenderness   Straight leg raising is normal  Strength & tone: normal   Stability overall normal stability  Encounter Diagnoses  Name Primary?   Lumbar pain Yes   Pelvic pain    Medial epicondylitis of elbow, right    I will get MRI of the lumbar spine.  Try to go to PT.  I have reviewed the [place] Controlled Substance Reporting System web site prior to prescribing narcotic medicine for this patient.  Return in three weeks.  Begin ice massage to the elbow.  Call if any problem.  Precautions discussed.  Electronically Signed Darreld Mclean, MD 9/13/202310:00 AM

## 2022-04-21 NOTE — Patient Instructions (Signed)
While we are working on your approval please go ahead and call to schedule your appointment with [facility] in at least 2 weeks.    Central Scheduling [phone]  AFTER you have made your imaging appointment, please call our office back at [phone] to schedule an appointment to review your results. Your follow up appointment will need to be 3-4 days after your imaging is performed to allow us time to get the report. If you are having your imaging performed in a facility not associated with [facility], you will need to ask for a CD with your images on them.   

## 2022-04-28 ENCOUNTER — Other Ambulatory Visit: Payer: Self-pay

## 2022-04-28 DIAGNOSIS — B9689 Other specified bacterial agents as the cause of diseases classified elsewhere: Secondary | ICD-10-CM

## 2022-04-28 MED ORDER — METRONIDAZOLE 500 MG PO TABS: 500.0000 mg | ORAL_TABLET | Freq: Two times a day (BID) | ORAL | 0 refills | Status: AC

## 2022-04-28 NOTE — Progress Notes (Signed)
refill 

## 2022-04-29 ENCOUNTER — Encounter (HOSPITAL_COMMUNITY): Payer: Self-pay | Admitting: Physical Therapy

## 2022-04-29 ENCOUNTER — Ambulatory Visit (HOSPITAL_COMMUNITY): Payer: Medicaid Other | Admitting: Physical Therapy

## 2022-04-29 DIAGNOSIS — M5459 Other low back pain: Secondary | ICD-10-CM

## 2022-04-29 NOTE — Therapy (Signed)
OUTPATIENT PHYSICAL THERAPY THORACOLUMBAR EVALUATION   Patient Name: Susan [name] MRN: [id number] DOB:Feb 21, 1976, 46 y.o., female Today's Date: 04/29/2022   PT End of Session - 04/29/22 1416     Visit Number 1    Number of Visits 18    Date for PT Re-Evaluation 06/24/22    Authorization Type Medicaid of     Authorization Time Period Submitted for first 3 visits (check auth)    Authorization - Visit Number 1    Authorization - Number of Visits 1    Progress Note Due on Visit 4    PT Start Time 1346    PT Stop Time 1426    PT Time Calculation (min) 40 min    Activity Tolerance Patient tolerated treatment well;Patient limited by pain    Behavior During Therapy [facility] for tasks assessed/performed             Past Medical History:  Diagnosis Date   Alcohol abuse    Asthma    BV (bacterial vaginosis) 03/06/2013   Chronic dental pain    Chronic pelvic pain in female    Depression    Ectopic pregnancy    GERD (gastroesophageal reflux disease)    Panic attacks    Polysubstance abuse (HCC)    cocaine, opiates, marijuana   Trichomonas vaginitis    Past Surgical History:  Procedure Laterality Date   ECTOPIC PREGNANCY SURGERY     Patient Active Problem List   Diagnosis Date Noted   Abdominal pain 04/06/2022   Vaginal discharge 04/06/2022   Cough in adult 04/06/2022   Tobacco use    Cocaine use    Sepsis due to Escherichia coli without acute organ dysfunction (HCC)    Pyelonephritis 10/08/2020   Polysubstance dependence (HCC) 07/27/2019   MDD (major depressive disorder), recurrent episode, severe (HCC) 01/23/2019   PTSD (post-traumatic stress disorder) 06/07/2017   Substance induced mood disorder (HCC) 06/04/2017   Preterm labor 04/10/2017   Asymptomatic bacteriuria during pregnancy in second trimester 03/14/2017   Late prenatal care in second trimester 03/08/2017   Supervision of normal pregnancy 03/08/2017   MDD (major depressive disorder), single episode, severe  with psychotic features (HCC) 01/05/2017   Alcohol use disorder, severe, dependence (HCC) 01/05/2017   Cocaine use disorder, moderate, dependence (HCC) 01/05/2017   Cannabis use disorder, moderate, dependence (HCC) 01/05/2017   Heavy smoker 02/03/2016   Cocaine abuse affecting pregnancy, antepartum (HCC) 02/03/2016   Chronic, continuous use of opioids 03/24/2015   Depression with anxiety 03/24/2015   Poor dentition 03/24/2015   Sprain of ankle, unspecified site 05/28/2009    PCP: Gilmore Laroche FNP  REFERRING PROVIDER: Darreld Mclean, MD   REFERRING DIAG: M54.50 (ICD-10-CM) - Lumbar pain   Rationale for Evaluation and Treatment Rehabilitation  THERAPY DIAG:  Other low back pain  ONSET DATE: Chronic (>20 years)   SUBJECTIVE:  SUBJECTIVE STATEMENT: Patient presents with history of chronic LBP. Patient reports pain began over 20 years ago due to a work injury. It has bothered her ever since. Worse pain with standing and bending. She is taking pain medication for symptoms. She has had therapy some time ago and it helped a little.   PERTINENT HISTORY:  Chronic LBP   PAIN:  Are you having pain? Yes: NPRS scale: 8/10 Pain location: low back Pain description: sharp, throbbing, stabbing  Aggravating factors: walking, standing, bending, lifting  Relieving factors: meds    PRECAUTIONS: None  WEIGHT BEARING RESTRICTIONS No  FALLS:  Has patient fallen in last 6 months? Yes. Number of falls 3-4  LIVING ENVIRONMENT: Lives with: lives with their family and lives alone Lives in: House/apartment Stairs: Yes: External: 7 steps; on left going up Has following equipment at home: None  OCCUPATION: Disabled   PLOF: Independent with basic ADLs  PATIENT GOALS Ease pain    OBJECTIVE:   DIAGNOSTIC  FINDINGS:  Impression: normal lumbar spine, no acute findings.   PATIENT SURVEYS:  Modified Oswestry 52% disability   COGNITION:  Overall cognitive status: Within functional limits for tasks assessed     POSTURE:  slouched seated posture   PALPATION: Mod TTP about bilateral lumbar paraspinals  LUMBAR ROM:   Active  A/PROM  eval  Flexion 70% limited  Extension 100% limited  Right lateral flexion   Left lateral flexion   Right rotation   Left rotation    (Blank rows = not tested)  LOWER EXTREMITY ROM:     Active  Right eval Left eval  Hip flexion    Hip extension    Hip abduction    Hip adduction    Hip internal rotation    Hip external rotation    Knee flexion    Knee extension    Ankle dorsiflexion    Ankle plantarflexion    Ankle inversion    Ankle eversion     (Blank rows = not tested)  LOWER EXTREMITY MMT:    MMT Right eval Left eval  Hip flexion 3+ 3+  Hip extension 3- 3-  Hip abduction 4- 3+  Hip adduction    Hip internal rotation    Hip external rotation    Knee flexion    Knee extension 3+ 3+  Ankle dorsiflexion 3+ 3+  Ankle plantarflexion    Ankle inversion    Ankle eversion     (Blank rows = not tested)  LUMBAR SPECIAL TESTS:  Straight leg raise test: Positive  FUNCTIONAL TESTS:  5 times sit to stand: 21.3 sec no UE  2 minute walk test: 280 feet  GAIT: Distance walked: 280 feet Assistive device utilized: None Level of assistance: Complete Independence Comments: decreased stride, antalgic    TODAY'S TREATMENT  Eval Ab brace Glute set    PATIENT EDUCATION:  Education details: on eval findings, POC and HEP  Person educated: Patient Education method: Explanation Education comprehension: verbalized understanding   HOME EXERCISE PROGRAM: Access Code: URL: [email or web address] Date: 04/29/2022 Prepared by: Georges Lynch  Exercises - Supine Transversus Abdominis Bracing - Hands on Stomach   - 2-3 x daily - 7 x weekly - 1 sets - 10 reps - 5 sec  hold - Supine Gluteal Sets  - 2-3 x daily - 7 x weekly - 1 sets - 10 reps - 5 sec  hold  ASSESSMENT:  CLINICAL IMPRESSION: Patient is a 46 y.o. female who presents to physical therapy with  complaint of chronic LBP. Patient demonstrates muscle weakness, reduced ROM, and fascial restrictions which are likely contributing to symptoms of pain and are negatively impacting patient ability to perform ADLs and functional mobility tasks. Patient will benefit from skilled physical therapy services to address these deficits to reduce pain and improve level of function with ADLs and functional mobility tasks.    OBJECTIVE IMPAIRMENTS Abnormal gait, decreased activity tolerance, decreased balance, decreased mobility, difficulty walking, decreased ROM, decreased strength, increased fascial restrictions, impaired perceived functional ability, impaired flexibility, improper body mechanics, postural dysfunction, and pain.   ACTIVITY LIMITATIONS carrying, lifting, bending, sitting, standing, squatting, stairs, transfers, and locomotion level  PARTICIPATION LIMITATIONS: meal prep, cleaning, laundry, driving, shopping, community activity, and yard work  PERSONAL FACTORS Past/current experiences and Time since onset of injury/illness/exacerbation are also affecting patient's functional outcome.   REHAB POTENTIAL: Good  CLINICAL DECISION MAKING: Stable/uncomplicated  EVALUATION COMPLEXITY: Low   GOALS: SHORT TERM GOALS: Target date: 05/27/2022  Patient will be independent with initial HEP and self-management strategies to improve functional outcomes Baseline:  Goal status: INITIAL    2. Patient will be able to ambulate at least 325 feet during 2MWT with LRAD to demonstrate improved ability to perform functional mobility and associated tasks. Baseline: 280 feet with no AD Goal status: INITIAL   LONG TERM GOALS: Target date: 06/24/2022  Patient  will be independent with advanced HEP and self-management strategies to improve functional outcomes Baseline:  Goal status: INITIAL  2.  Patient will improve Oswestry score to at least 35% or less to indicate improvement in functional outcomes Baseline: 52% disability  Goal status: INITIAL  3.  Patient will report reduction of back pain to <3/10 for improved quality of life and ability to perform ADLs  Baseline: 8/10 Goal status: INITIAL  4. Patient will have equal to or > 4/5 MMT throughout BLE to improve ability to perform functional mobility, stair ambulation and ADLs.  Baseline: See MMT Goal status: INITIAL  5. Patient will be able to ambulate at least 400 feet during 2MWT with LRAD to demonstrate improved ability to perform functional mobility and associated tasks. Baseline: 280 feet with no AD Goal status: INITIAL     PLAN: PT FREQUENCY: 2x/week  PT DURATION: 8 weeks  PLANNED INTERVENTIONS: Therapeutic exercises, Therapeutic activity, Neuromuscular re-education, Balance training, Gait training, Patient/Family education, Joint manipulation, Joint mobilization, Stair training, Aquatic Therapy, Dry Needling, Electrical stimulation, Spinal manipulation, Spinal mobilization, Cryotherapy, Moist heat, scar mobilization, Taping, Traction, Ultrasound, Biofeedback, Ionotophoresis 4mg /ml Dexamethasone, and Manual therapy.   PLAN FOR NEXT SESSION: Progress hip and core strength. Pain free lumbar AROM and stretching as tolerated.    2:17 PM, 04/29/22 Josue Hector PT DPT  Physical Therapist with [facility]  [phone]

## 2022-05-04 ENCOUNTER — Telehealth: Payer: Self-pay | Admitting: Family Medicine

## 2022-05-04 ENCOUNTER — Ambulatory Visit (HOSPITAL_COMMUNITY)
Admission: RE | Admit: 2022-05-04 | Discharge: 2022-05-04 | Disposition: A | Discharge status: Home / Self Care | Payer: Medicaid Other | Source: Ambulatory Visit | Attending: Family Medicine | Admitting: Family Medicine

## 2022-05-04 DIAGNOSIS — R102 Pelvic and perineal pain: Secondary | ICD-10-CM | POA: Insufficient documentation

## 2022-05-04 NOTE — Telephone Encounter (Signed)
Left vm letting pt know results are not back yet.

## 2022-05-04 NOTE — Telephone Encounter (Signed)
Patient called in wanting ultrasound results.

## 2022-05-07 ENCOUNTER — Encounter: Payer: Self-pay | Admitting: Family Medicine

## 2022-05-07 ENCOUNTER — Telehealth: Payer: Self-pay | Admitting: Family Medicine

## 2022-05-07 ENCOUNTER — Ambulatory Visit: Payer: Medicaid Other | Admitting: Family Medicine

## 2022-05-07 NOTE — Telephone Encounter (Signed)
Pt would like US results.

## 2022-05-07 NOTE — Telephone Encounter (Signed)
Pt asked for nurse to give her a call about some questions about her stomach

## 2022-05-07 NOTE — Telephone Encounter (Signed)
Returned call, left vm.

## 2022-05-07 NOTE — Progress Notes (Signed)
The imaging study shows a benign cyst on the left ovary, with recommended screening annually.

## 2022-05-10 ENCOUNTER — Telehealth: Payer: Self-pay | Admitting: Family Medicine

## 2022-05-10 ENCOUNTER — Other Ambulatory Visit: Payer: Self-pay | Admitting: Family Medicine

## 2022-05-10 DIAGNOSIS — R059 Cough, unspecified: Secondary | ICD-10-CM

## 2022-05-10 NOTE — Telephone Encounter (Signed)
Pt called stating she has an yeast infection from the antibodics she was on. Wants to know if she can please get something for this?  Missed last appt due to transportation issues   [facility]

## 2022-05-11 ENCOUNTER — Ambulatory Visit: Payer: Medicaid Other | Admitting: Orthopaedic Surgery

## 2022-05-11 ENCOUNTER — Other Ambulatory Visit: Payer: Self-pay | Admitting: Family Medicine

## 2022-05-11 DIAGNOSIS — B379 Candidiasis, unspecified: Secondary | ICD-10-CM

## 2022-05-11 MED ORDER — FLUCONAZOLE 150 MG PO TABS: 150.0000 mg | ORAL_TABLET | Freq: Once | ORAL | 0 refills | Status: AC

## 2022-05-11 NOTE — Telephone Encounter (Signed)
Prescription sent

## 2022-05-11 NOTE — Telephone Encounter (Signed)
Pt has been informed.

## 2022-05-11 NOTE — Telephone Encounter (Signed)
You can refill

## 2022-05-12 ENCOUNTER — Telehealth: Payer: Self-pay | Admitting: Orthopaedic Surgery

## 2022-05-12 ENCOUNTER — Ambulatory Visit (HOSPITAL_COMMUNITY): Payer: Medicaid Other

## 2022-05-12 DIAGNOSIS — R102 Pelvic and perineal pain: Secondary | ICD-10-CM

## 2022-05-12 MED ORDER — HYDROCODONE-ACETAMINOPHEN 5-325 MG PO TABS: 1.0000 | ORAL_TABLET | Freq: Four times a day (QID) | ORAL | 0 refills | Status: DC | PRN

## 2022-05-12 NOTE — Telephone Encounter (Signed)
Patient wants refill on her pain medicine.   She wanted to come in and be seen so she can get her pain medicine. But then she said she missed her appt yesterday.  I advised her she has to have her MRI done and wait 24 to 48 hours before we can see her to give them time to review the results.     HYDROcodone-acetaminophen (NORCO/VICODIN) 5-325 MG tablet  Pharmacy: [facility]

## 2022-05-14 ENCOUNTER — Ambulatory Visit (HOSPITAL_COMMUNITY): Admission: RE | Admit: 2022-05-14 | Payer: Medicaid Other | Source: Ambulatory Visit

## 2022-05-14 ENCOUNTER — Encounter: Payer: Medicaid Other | Admitting: Family Medicine

## 2022-05-14 NOTE — Progress Notes (Deleted)
Virtual Visit via Telephone Note   This visit type was conducted via telephone. This format is felt to be most appropriate for this patient at this time.  The patient did not have access to video technology/had technical difficulties with video requiring transitioning to audio format only (telephone).  All issues noted in this document were discussed and addressed.  No physical exam could be performed with this format.  Evaluation Performed:  Follow-up visit  Date:  05/14/2022   ID:  [name], Susan 06/06/1976, MRN [id number]  Patient Location: Home Provider Location: Office/Clinic  Participants: Patient Location of Patient: Home Location of Provider: Telehealth Consent was obtain for visit to be over via telehealth. I verified that I am speaking with the correct person using two identifiers.  PCP:  Alvira Monday, FNP   Chief Complaint:  ***  History of Present Illness:    Susan [name] is a 46 y.o. female with ***  The patient {does/does not:200015} have symptoms concerning for COVID-19 infection (fever, chills, cough, or new shortness of breath).   Past Medical, Surgical, Social History, Allergies, and Medications have been Reviewed.  Past Medical History:  Diagnosis Date   Alcohol abuse    Asthma    BV (bacterial vaginosis) 03/06/2013   Chronic dental pain    Chronic pelvic pain in female    Depression    Ectopic pregnancy    GERD (gastroesophageal reflux disease)    Panic attacks    Polysubstance abuse ([place])    cocaine, opiates, marijuana   Trichomonas vaginitis    Past Surgical History:  Procedure Laterality Date   ECTOPIC PREGNANCY SURGERY       No outpatient medications have been marked as taking for the 05/14/22 encounter (Appointment) with Alvira Monday, [place].     Allergies:   Patient has no known allergies.   ROS:   Please see the history of present illness.    *** All other systems reviewed and are negative.   Labs/Other Tests and Data  Reviewed:    Recent Labs: 04/06/2022: ALT 9; BUN 11; Creatinine, Ser 0.56; Hemoglobin 12.8; Platelets 249; Potassium 4.2; Sodium 135; TSH 3.010   Recent Lipid Panel Lab Results  Component Value Date/Time   CHOL 154 04/06/2022 09:24 AM   TRIG 147 04/06/2022 09:24 AM   HDL 64 04/06/2022 09:24 AM   CHOLHDL 2.4 04/06/2022 09:24 AM   CHOLHDL 3.2 01/23/2019 11:00 AM   LDLCALC 65 04/06/2022 09:24 AM    Wt Readings from Last 3 Encounters:  04/06/22 155 lb 0.6 oz (70.3 kg)  03/23/22 151 lb (68.5 kg)  03/03/22 151 lb 9.6 oz (68.8 kg)     Objective:    Vital Signs:  There were no vitals taken for this visit.   {[facility] Virtual Exam (Optional):[phone]::"VITAL SIGNS:  reviewed"}  ASSESSMENT & PLAN:     Time:   Today, I have spent *** minutes reviewing the chart, including problem list, medications, and with the patient with telehealth technology discussing the above problems.   Medication Adjustments/Labs and Tests Ordered: Current medicines are reviewed at length with the patient today.  Concerns regarding medicines are outlined above.   Tests Ordered: No orders of the defined types were placed in this encounter.   Medication Changes: No orders of the defined types were placed in this encounter.    Note: This dictation was prepared with Dragon dictation along with smaller phrase technology. Similar sounding words can be transcribed inadequately or  may not be corrected upon review. Any transcriptional errors that result from this process are unintentional.      Disposition:  Follow up  Signed, Gilmore Laroche, FNP  05/14/2022 4:46 PM     [facility]

## 2022-05-17 ENCOUNTER — Ambulatory Visit (INDEPENDENT_AMBULATORY_CARE_PROVIDER_SITE_OTHER): Payer: Medicaid Other | Admitting: Family Medicine

## 2022-05-17 ENCOUNTER — Encounter: Payer: Self-pay | Admitting: Family Medicine

## 2022-05-17 ENCOUNTER — Other Ambulatory Visit: Payer: Self-pay | Admitting: Orthopaedic Surgery

## 2022-05-17 VITALS — BP 103/70 | HR 83 | Wt 153.0 lb

## 2022-05-17 DIAGNOSIS — K047 Periapical abscess without sinus: Secondary | ICD-10-CM

## 2022-05-17 DIAGNOSIS — J209 Acute bronchitis, unspecified: Secondary | ICD-10-CM

## 2022-05-17 DIAGNOSIS — Z23 Encounter for immunization: Secondary | ICD-10-CM

## 2022-05-17 DIAGNOSIS — F17200 Nicotine dependence, unspecified, uncomplicated: Secondary | ICD-10-CM

## 2022-05-17 DIAGNOSIS — R102 Pelvic and perineal pain: Secondary | ICD-10-CM

## 2022-05-17 MED ORDER — HYDROCODONE-ACETAMINOPHEN 5-325 MG PO TABS: 1.0000 | ORAL_TABLET | Freq: Four times a day (QID) | ORAL | 0 refills | Status: DC | PRN

## 2022-05-17 MED ORDER — METHYLPREDNISOLONE 4 MG PO TBPK: ORAL_TABLET | ORAL | 0 refills | Status: DC

## 2022-05-17 MED ORDER — AMOXICILLIN-POT CLAVULANATE 875-125 MG PO TABS: 1.0000 | ORAL_TABLET | Freq: Two times a day (BID) | ORAL | 0 refills | Status: AC

## 2022-05-17 NOTE — Progress Notes (Signed)
Erroneous encounter-disregard

## 2022-05-17 NOTE — Patient Instructions (Signed)
I appreciate the opportunity to provide care to you today!    Follow up:  3 months  Please pick up your medication at the pharmacy   Please continue to a heart-healthy diet and increase your physical activities. Try to exercise for 30mins at least three times a week.      It was a pleasure to see you and I look forward to continuing to work together on your health and well-being. Please do not hesitate to call the office if you need care or have questions about your care.   Have a wonderful day and week. With Gratitude, Gracilyn Gunia MSN, FNP-BC  

## 2022-05-17 NOTE — Progress Notes (Signed)
Established Patient Office Visit  Subjective:  Patient ID: Susan [name], female    DOB: 23-Mar-1976  Age: 46 y.o. MRN: [id number]  CC:  Chief Complaint  Patient presents with   Cough    Pt states she has a cough started in September the patient is having mouth pain on left side due to a tooth broken off.    HPI Susan [name] is a 46 y.o. female with past medical history of poor dentition, anxiety and depression, and heavy smoker presents for f/u of  chronic medical conditions.  Acute bronchitis: She c/o a nonproductive cough for a month with increased wheezing and shortness of breath.  She reports minimal relief of symptoms with Promethazine DM.  Tobacco use: She smokes 2 packs of cigarettes daily.   Dental abscess: She complains of increased pain and tenderness of her right first molar.  The onset of symptoms was 2 weeks ago; she rates pain 9 out of 10 noting that the pain is constant.  She describes pain as achy and sharp.  She reports facial swelling and pain when lying on the affected side.  Past Medical History:  Diagnosis Date   Alcohol abuse    Asthma    BV (bacterial vaginosis) 03/06/2013   Chronic dental pain    Chronic pelvic pain in female    Depression    Ectopic pregnancy    GERD (gastroesophageal reflux disease)    Panic attacks    Polysubstance abuse ([facility])    cocaine, opiates, marijuana   Trichomonas vaginitis     Past Surgical History:  Procedure Laterality Date   ECTOPIC PREGNANCY SURGERY      Family History  Problem Relation Age of Onset   Diabetes Mother    COPD Mother    Stroke Mother    Asthma Sister    Down syndrome Sister    Heart disease Maternal Grandmother    Hypertension Maternal Grandmother    Stroke Maternal Grandfather     Social History   Socioeconomic History   Marital status: Single    Spouse name: Not on file   Number of children: Not on file   Years of education: Not on file   Highest education level: Not on file   Occupational History   Not on file  Tobacco Use   Smoking status: Every Day    Packs/day: 2.00    Years: 26.00    Total pack years: 52.00    Types: Cigarettes   Smokeless tobacco: Never  Vaping Use   Vaping Use: Some days  Substance and Sexual Activity   Alcohol use: Yes    Alcohol/week: 2.0 standard drinks of alcohol    Types: 2 Cans of beer per week    Comment: 2-3 cans of beer every other day   Drug use: Yes    Types: Cocaine    Comment: last week as of 08/03/21   Sexual activity: Yes    Birth control/protection: None  Other Topics Concern   Not on file  Social History Narrative   Not on file   Social Determinants of Health   Financial Resource Strain: Not on file  Food Insecurity: Not on file  Transportation Needs: Not on file  Physical Activity: Not on file  Stress: Not on file  Social Connections: Not on file  Intimate Partner Violence: Not on file    Outpatient Medications Prior to Visit  Medication Sig Dispense Refill   HYDROcodone-acetaminophen (NORCO/VICODIN) 5-325 MG tablet Take 1  tablet by mouth every 6 (six) hours as needed for moderate pain. 28 tablet 0   naproxen (NAPROSYN) 500 MG tablet Take 1 tablet (500 mg total) by mouth 2 (two) times daily with a meal. 60 tablet 5   promethazine-dextromethorphan (PROMETHAZINE-DM) 6.25-15 MG/5ML syrup Take 1.3 mLs by mouth 4 (four) times daily as needed for cough. 118 mL 0   No facility-administered medications prior to visit.    No Known Allergies  ROS Review of Systems  Constitutional:  Negative for chills and fever.  HENT:  Positive for dental problem.   Eyes:  Negative for visual disturbance.  Respiratory:  Positive for cough, shortness of breath and wheezing.   Neurological:  Positive for headaches. Negative for dizziness.      Objective:    Physical Exam HENT:     Head: Normocephalic.     Right Ear: External ear normal.     Mouth/Throat:     Mouth: Mucous membranes are moist.      Comments:  Tenderness to palpation of the first primary molar Cardiovascular:     Rate and Rhythm: Normal rate and regular rhythm.     Pulses: Normal pulses.     Heart sounds: Normal heart sounds.  Pulmonary:     Breath sounds: Wheezing present.  Abdominal:     Palpations: Abdomen is soft.  Neurological:     Mental Status: She is alert.     BP 103/70 (BP Location: Right Arm, Patient Position: Sitting)   Pulse 83   Wt 153 lb (69.4 kg)   SpO2 97%   BMI 28.91 kg/m  Wt Readings from Last 3 Encounters:  05/17/22 153 lb (69.4 kg)  04/06/22 155 lb 0.6 oz (70.3 kg)  03/23/22 151 lb (68.5 kg)    Lab Results  Component Value Date   TSH 3.010 04/06/2022   Lab Results  Component Value Date   WBC 10.0 04/06/2022   HGB 12.8 04/06/2022   HCT 37.6 04/06/2022   MCV 98 (H) 04/06/2022   PLT 249 04/06/2022   Lab Results  Component Value Date   NA 135 04/06/2022   K 4.2 04/06/2022   CO2 20 04/06/2022   GLUCOSE 115 (H) 04/06/2022   BUN 11 04/06/2022   CREATININE 0.56 (L) 04/06/2022   BILITOT 0.2 04/06/2022   ALKPHOS 60 04/06/2022   AST 13 04/06/2022   ALT 9 04/06/2022   PROT 6.0 04/06/2022   ALBUMIN 4.0 04/06/2022   CALCIUM 8.5 (L) 04/06/2022   ANIONGAP 8 08/03/2021   EGFR 115 04/06/2022   Lab Results  Component Value Date   CHOL 154 04/06/2022   Lab Results  Component Value Date   HDL 64 04/06/2022   Lab Results  Component Value Date   LDLCALC 65 04/06/2022   Lab Results  Component Value Date   TRIG 147 04/06/2022   Lab Results  Component Value Date   CHOLHDL 2.4 04/06/2022   Lab Results  Component Value Date   HGBA1C 5.9 (H) 04/06/2022      Assessment & Plan:   Problem List Items Addressed This Visit       Respiratory   Acute bronchitis    Will treat with a short course of steroids Smoking cessation encouraged Informed patient that smoking is irritant to her throat and lungs, which increases mucus production and cough      Relevant Medications    methylPREDNISolone (MEDROL DOSEPAK) 4 MG TBPK tablet     Digestive   Dental abscess  We will treat with Augmentin for 5 days Encouraged to take ibuprofen or Tylenol as needed for pain        Other   Heavy smoker    She smokes 2 packs of cigarettes daily Smoking cessation encouraged       Need for immunization against influenza    Patient educated on CDC recommendation for the vaccine. Verbal consent was obtained from the patient, vaccine administered by nurse, no sign of adverse reactions noted at this time. Patient education on arm soreness and use of tylenol or ibuprofen for this patient  was discussed. Patient educated on the signs and symptoms of adverse effect and advise to contact the office if they occur.       Relevant Orders   Flu Vaccine QUAD 37moIM (Fluarix, Fluzone & Alfiuria Quad PF) (Completed)   Other Visit Diagnoses     Dental infection    -  Primary   Relevant Medications   amoxicillin-clavulanate (AUGMENTIN) 875-125 MG tablet       Meds ordered this encounter  Medications   amoxicillin-clavulanate (AUGMENTIN) 875-125 MG tablet    Sig: Take 1 tablet by mouth 2 (two) times daily for 5 days.    Dispense:  10 tablet    Refill:  0   methylPREDNISolone (MEDROL DOSEPAK) 4 MG TBPK tablet    Sig: Take as the package instructed    Dispense:  1 each    Refill:  0    Follow-up: Return in about 3 months (around 08/17/2022).    GAlvira Monday FNP

## 2022-05-17 NOTE — Assessment & Plan Note (Signed)
We will treat with Augmentin for 5 days Encouraged to take ibuprofen or Tylenol as needed for pain

## 2022-05-17 NOTE — Assessment & Plan Note (Signed)
She smokes 2 packs of cigarettes daily Smoking cessation encouraged

## 2022-05-17 NOTE — Assessment & Plan Note (Signed)
Will treat with a short course of steroids Smoking cessation encouraged Informed patient that smoking is irritant to her throat and lungs, which increases mucus production and cough

## 2022-05-17 NOTE — Telephone Encounter (Signed)
Patient came to office requesting refill on medication until her (rescheduled) MRI is done, which is rescheduled for 06/08/22. Patient is aware of follow up appointment scheduled, and aware that Dr Luna Glasgow is out of office this week. Please advise regarding refill request:  HYDROcodone-acetaminophen (NORCO/VICODIN) 5-325 MG tablet 28 tablet       [facility]

## 2022-05-17 NOTE — Assessment & Plan Note (Signed)
Patient educated on CDC recommendation for the vaccine. Verbal consent was obtained from the patient, vaccine administered by nurse, no sign of adverse reactions noted at this time. Patient education on arm soreness and use of tylenol or ibuprofen for this patient  was discussed. Patient educated on the signs and symptoms of adverse effect and advise to contact the office if they occur.  

## 2022-05-18 ENCOUNTER — Telehealth (HOSPITAL_COMMUNITY): Payer: Self-pay

## 2022-05-18 ENCOUNTER — Ambulatory Visit (HOSPITAL_COMMUNITY): Payer: Medicaid Other | Attending: Orthopaedic Surgery

## 2022-05-18 ENCOUNTER — Encounter (HOSPITAL_COMMUNITY): Payer: Self-pay

## 2022-05-18 NOTE — Telephone Encounter (Signed)
No show, called and left message concerning missed apt.  Encouraged to call back and left [place] know if she wishes to continue with therapy as eval was 9/21.  Included contact number and time scheduled for next apt.  Ihor Austin, LPTA/CLT; Delana Meyer [phone]

## 2022-05-20 ENCOUNTER — Telehealth (HOSPITAL_COMMUNITY): Payer: Self-pay

## 2022-05-20 ENCOUNTER — Encounter (HOSPITAL_COMMUNITY): Payer: Medicaid Other

## 2022-05-20 NOTE — Telephone Encounter (Signed)
No show, called and left message concerning missed apt.  Educated no show policy and will need to schedule further apts following each apt.  Reminded next apt date and time and will be in [facility] due to dept moving.  Included contact information if needs to cancel/reschedule.  Ihor Austin, LPTA/CLT; Delana Meyer [phone]

## 2022-05-25 ENCOUNTER — Encounter (HOSPITAL_COMMUNITY): Payer: Medicaid Other

## 2022-05-27 ENCOUNTER — Telehealth (HOSPITAL_COMMUNITY): Payer: Self-pay

## 2022-05-27 ENCOUNTER — Encounter (HOSPITAL_COMMUNITY): Payer: Medicaid Other

## 2022-05-27 NOTE — Telephone Encounter (Signed)
No show, attempted to call with an answer stating she is not there then call ended.  Called back with no answer and left message informing pt she is DC to [facility] due to no show policy.  Included contact number for any questions.    Ihor Austin, LPTA/CLT; Delana Meyer [phone]

## 2022-06-01 ENCOUNTER — Encounter (HOSPITAL_COMMUNITY): Payer: Medicaid Other | Admitting: Physical Therapy

## 2022-06-03 ENCOUNTER — Encounter (HOSPITAL_COMMUNITY): Payer: Medicaid Other | Admitting: Physical Therapy

## 2022-06-03 ENCOUNTER — Ambulatory Visit (INDEPENDENT_AMBULATORY_CARE_PROVIDER_SITE_OTHER): Payer: Medicaid Other | Admitting: Orthopaedic Surgery

## 2022-06-03 ENCOUNTER — Encounter: Payer: Self-pay | Admitting: Orthopaedic Surgery

## 2022-06-03 VITALS — BP 105/67 | HR 97 | Ht 61.0 in | Wt 148.0 lb

## 2022-06-03 DIAGNOSIS — M25521 Pain in right elbow: Secondary | ICD-10-CM

## 2022-06-03 DIAGNOSIS — M7712 Lateral epicondylitis, left elbow: Secondary | ICD-10-CM | POA: Diagnosis not present

## 2022-06-03 DIAGNOSIS — R102 Pelvic and perineal pain: Secondary | ICD-10-CM | POA: Diagnosis not present

## 2022-06-03 DIAGNOSIS — M545 Low back pain, unspecified: Secondary | ICD-10-CM

## 2022-06-03 DIAGNOSIS — G8929 Other chronic pain: Secondary | ICD-10-CM

## 2022-06-03 MED ORDER — HYDROCODONE-ACETAMINOPHEN 5-325 MG PO TABS: 1.0000 | ORAL_TABLET | Freq: Four times a day (QID) | ORAL | 0 refills | Status: DC | PRN

## 2022-06-03 NOTE — Progress Notes (Signed)
My MRI is next week.  She had transportation issues and could not get MRI.  It is now scheduled for 06-08-22 and she has transportation arranged.  Her back is still hurting.  Her right elbow is tender and she is doing the ice massage.  Right elbow has tenderness over lateral epicondyle and had pain to resisted dorsal extension.  NV intact.  Spine/Pelvis examination:  Inspection:  Overall, sacoiliac joint benign and hips nontender; without crepitus or defects.   Thoracic spine inspection: Alignment normal without kyphosis present   Lumbar spine inspection:  Alignment  with normal lumbar lordosis, without scoliosis apparent.   Thoracic spine palpation:  without tenderness of spinal processes   Lumbar spine palpation: without tenderness of lumbar area; without tightness of lumbar muscles    Range of Motion:   Lumbar flexion, forward flexion is normal without pain or tenderness    Lumbar extension is full without pain or tenderness   Left lateral bend is normal without pain or tenderness   Right lateral bend is normal without pain or tenderness   Straight leg raising is normal  Strength & tone: normal   Stability overall normal stability  Encounter Diagnoses  Name Primary?   Lateral epicondylitis, left elbow Yes   Chronic pain of right elbow    Lumbar pain    Pelvic pain    I will refill her pain medicine.    Get the MRI.  Return in two weeks.  Call if any problem.  Precautions discussed.  Electronically Signed Sanjuana Kava, MD 10/26/20239:56 AM

## 2022-06-07 ENCOUNTER — Ambulatory Visit (HOSPITAL_COMMUNITY): Payer: Medicaid Other

## 2022-06-08 ENCOUNTER — Ambulatory Visit (HOSPITAL_COMMUNITY): Admission: RE | Admit: 2022-06-08 | Payer: Medicaid Other | Source: Ambulatory Visit

## 2022-06-08 ENCOUNTER — Encounter (HOSPITAL_COMMUNITY): Payer: Medicaid Other

## 2022-06-09 ENCOUNTER — Telehealth: Payer: Self-pay | Admitting: Orthopaedic Surgery

## 2022-06-09 NOTE — Telephone Encounter (Signed)
I called patient and got her voicemail.  Left message to call the office to R/S her appointment after she has her MRI done.

## 2022-06-10 ENCOUNTER — Encounter (HOSPITAL_COMMUNITY): Payer: Medicaid Other | Admitting: Physical Therapy

## 2022-06-10 ENCOUNTER — Ambulatory Visit: Payer: Medicaid Other | Admitting: Orthopaedic Surgery

## 2022-06-15 ENCOUNTER — Encounter (HOSPITAL_COMMUNITY): Payer: Medicaid Other

## 2022-06-16 ENCOUNTER — Telehealth: Payer: Self-pay

## 2022-06-16 DIAGNOSIS — R102 Pelvic and perineal pain: Secondary | ICD-10-CM

## 2022-06-16 NOTE — Telephone Encounter (Signed)
Patient wants refill on her pain medicine   HYDROcodone-acetaminophen (NORCO/VICODIN) 5-325 MG tablet   Pharmacy:  [facility]

## 2022-06-16 NOTE — Telephone Encounter (Signed)
Refill request sent to Dr.Keeling. Patient has not had MRI done. Originally rescheduled for 06/08/22. Patient has rescheduled again for 06/30/22.

## 2022-06-17 ENCOUNTER — Encounter (HOSPITAL_COMMUNITY): Payer: Medicaid Other

## 2022-06-17 ENCOUNTER — Ambulatory Visit: Payer: Medicaid Other | Admitting: Internal Medicine

## 2022-06-17 MED ORDER — HYDROCODONE-ACETAMINOPHEN 5-325 MG PO TABS: 1.0000 | ORAL_TABLET | Freq: Four times a day (QID) | ORAL | 0 refills | Status: DC | PRN

## 2022-06-18 NOTE — Telephone Encounter (Signed)
Done

## 2022-06-22 ENCOUNTER — Encounter: Payer: Self-pay | Admitting: Orthopaedic Surgery

## 2022-06-22 ENCOUNTER — Encounter (HOSPITAL_COMMUNITY): Payer: Medicaid Other | Admitting: Physical Therapy

## 2022-06-22 ENCOUNTER — Ambulatory Visit: Payer: Medicaid Other | Admitting: Orthopaedic Surgery

## 2022-06-22 ENCOUNTER — Ambulatory Visit: Payer: Medicaid Other | Admitting: Internal Medicine

## 2022-06-22 VITALS — Ht 61.0 in | Wt 160.0 lb

## 2022-06-22 DIAGNOSIS — M7701 Medial epicondylitis, right elbow: Secondary | ICD-10-CM | POA: Diagnosis not present

## 2022-06-22 DIAGNOSIS — R102 Pelvic and perineal pain: Secondary | ICD-10-CM | POA: Diagnosis not present

## 2022-06-22 DIAGNOSIS — M25521 Pain in right elbow: Secondary | ICD-10-CM

## 2022-06-22 DIAGNOSIS — F17210 Nicotine dependence, cigarettes, uncomplicated: Secondary | ICD-10-CM

## 2022-06-22 DIAGNOSIS — M545 Low back pain, unspecified: Secondary | ICD-10-CM | POA: Diagnosis not present

## 2022-06-22 DIAGNOSIS — G8929 Other chronic pain: Secondary | ICD-10-CM

## 2022-06-22 MED ORDER — HYDROCODONE-ACETAMINOPHEN 5-325 MG PO TABS: 1.0000 | ORAL_TABLET | Freq: Four times a day (QID) | ORAL | 0 refills | Status: DC | PRN

## 2022-06-22 NOTE — Patient Instructions (Signed)
Smoking Tobacco Information, Adult Smoking tobacco can be harmful to your health. Tobacco contains a toxic colorless chemical called nicotine. Nicotine causes changes in your brain that make you want more and more. This is called addiction. This can make it hard to stop smoking once you start. Tobacco also has other toxic chemicals that can hurt your body and raise your risk of many cancers. Menthol or "lite" tobacco or cigarette brands are not safer than regular brands. How can smoking tobacco affect me? Smoking tobacco puts you at risk for: Cancer. Smoking is most commonly associated with lung cancer, but can also lead to cancer in other parts of the body. Chronic obstructive pulmonary disease (COPD). This is a long-term lung condition that makes it hard to breathe. It also gets worse over time. High blood pressure (hypertension), heart disease, stroke, heart attack, and lung infections, such as pneumonia. Cataracts. This is when the lenses in the eyes become clouded. Digestive problems. This may include peptic ulcers, heartburn, and gastroesophageal reflux disease (GERD). Oral health problems, such as gum disease, mouth sores, and tooth loss. Loss of taste and smell. Smoking also affects how you look and smell. Smoking may cause: Wrinkles. Yellow or stained teeth, fingers, and fingernails. Bad breath. Bad-smelling clothes and hair. Smoking tobacco can also affect your social life, because: It may be challenging to find places to smoke when away from home. Many workplaces, restaurants, hotels, and public places are tobacco-free. Smoking is expensive. This is due to the cost of tobacco and the long-term costs of treating health problems from smoking. Secondhand smoke may affect those around you. Secondhand smoke can cause lung cancer, breathing problems, and heart disease. Children of smokers have a higher risk for: Sudden infant death syndrome (SIDS). Ear infections. Lung infections. What  actions can I take to prevent health problems? Quit smoking  Do not start smoking. Quit if you already smoke. Do not replace cigarette smoking with vaping devices, such as e-cigarettes. Make a plan to quit smoking and commit to it. Look for programs to help you, and ask your health care provider for recommendations and ideas. Set a date and write down all the reasons you want to quit. Let your friends and family know you are quitting so they can help and support you. Consider finding friends who also want to quit. It can be easier to quit with someone else, so that you can support each other. Talk with your health care provider about using nicotine replacement medicines to help you quit. These include gum, lozenges, patches, sprays, or pills. If you try to quit but return to smoking, stay positive. It is common to slip up when you first quit, so take it one day at a time. Be prepared for cravings. When you feel the urge to smoke, chew gum or suck on hard candy. Lifestyle Stay busy. Take care of your body. Get plenty of exercise, eat a healthy diet, and drink plenty of water. Find ways to manage your stress, such as meditation, yoga, exercise, or time spent with friends and family. Ask your health care provider about having regular tests (screenings) to check for cancer. This may include blood tests, imaging tests, and other tests. Where to find support To get support to quit smoking, consider: Asking your health care provider for more information and resources. Joining a support group for people who want to quit smoking in your local community. There are many effective programs that may help you to quit. Calling the smokefree.gov counselor   helpline at 1-800-QUIT-NOW (1-800-784-8669). Where to find more information You may find more information about quitting smoking from: Centers for Disease Control and Prevention: cdc.gov/tobacco Smokefree.gov: smokefree.gov American Lung Association:  freedomfromsmoking.org Contact a health care provider if: You have problems breathing. Your lips, nose, or fingers turn blue. You have chest pain. You are coughing up blood. You feel like you will faint. You have other health changes that cause you to worry. Summary Smoking tobacco can negatively affect your health, the health of those around you, your finances, and your social life. Do not start smoking. Quit if you already smoke. If you need help quitting, ask your health care provider. Consider joining a support group for people in your local community who want to quit smoking. There are many effective programs that may help you to quit. This information is not intended to replace advice given to you by your health care provider. Make sure you discuss any questions you have with your health care provider. Document Revised: 07/21/2021 Document Reviewed: 07/21/2021 Elsevier Patient Education  2023 Elsevier Inc.  

## 2022-06-22 NOTE — Progress Notes (Signed)
My elbow is slightly better.  She has chronic right lateral epicondyle pain which is slightly better.  She is taking the Naprosyn and doing the ice massage.  She had to put off the MRI because of cold.  She is scheduled to have lumbar MRI on 06-30-22.  Spine/Pelvis examination:  Inspection:  Overall, sacoiliac joint benign and hips nontender; without crepitus or defects.   Thoracic spine inspection: Alignment normal without kyphosis present   Lumbar spine inspection:  Alignment  with normal lumbar lordosis, without scoliosis apparent.   Thoracic spine palpation:  without tenderness of spinal processes   Lumbar spine palpation: without tenderness of lumbar area; without tightness of lumbar muscles    Range of Motion:   Lumbar flexion, forward flexion is normal without pain or tenderness    Lumbar extension is full without pain or tenderness   Left lateral bend is normal without pain or tenderness   Right lateral bend is normal without pain or tenderness   Straight leg raising is normal  Strength & tone: normal   Stability overall normal stability  Right lateral epicondyle is tender but no swelling and no redness.  Dorsiflexion of wrist is not pain to resisted stretch today.  Encounter Diagnoses  Name Primary?   Chronic pain of right elbow Yes   Medial epicondylitis of elbow, right    Lumbar pain    Pelvic pain    Nicotine dependence, cigarettes, uncomplicated    Return in two weeks.  Get MRI.  Call if any problem.  Precautions discussed.  I have reviewed the [place] Controlled Substance Reporting System web site prior to prescribing narcotic medicine for this patient.  Electronically Signed Darreld Mclean, MD 11/14/202311:04 AM

## 2022-06-24 ENCOUNTER — Encounter (HOSPITAL_COMMUNITY): Payer: Medicaid Other

## 2022-06-28 ENCOUNTER — Telehealth: Payer: Self-pay | Admitting: Radiology

## 2022-06-28 ENCOUNTER — Encounter: Payer: Self-pay | Admitting: Internal Medicine

## 2022-06-28 ENCOUNTER — Ambulatory Visit (INDEPENDENT_AMBULATORY_CARE_PROVIDER_SITE_OTHER): Payer: Medicaid Other | Admitting: Internal Medicine

## 2022-06-28 VITALS — BP 116/77 | HR 83 | Ht 61.0 in | Wt 150.0 lb

## 2022-06-28 DIAGNOSIS — B009 Herpesviral infection, unspecified: Secondary | ICD-10-CM | POA: Diagnosis not present

## 2022-06-28 DIAGNOSIS — R252 Cramp and spasm: Secondary | ICD-10-CM

## 2022-06-28 DIAGNOSIS — R102 Pelvic and perineal pain: Secondary | ICD-10-CM

## 2022-06-28 DIAGNOSIS — J069 Acute upper respiratory infection, unspecified: Secondary | ICD-10-CM | POA: Diagnosis not present

## 2022-06-28 MED ORDER — AZITHROMYCIN 250 MG PO TABS: ORAL_TABLET | ORAL | 0 refills | Status: DC

## 2022-06-28 MED ORDER — HYDROCODONE-ACETAMINOPHEN 5-325 MG PO TABS: 1.0000 | ORAL_TABLET | Freq: Four times a day (QID) | ORAL | 0 refills | Status: DC | PRN

## 2022-06-28 MED ORDER — FLUTICASONE PROPIONATE 50 MCG/ACT NA SUSP: 2.0000 | Freq: Every day | NASAL | 0 refills | Status: DC

## 2022-06-28 MED ORDER — DICLOFENAC SODIUM 1 % EX GEL: 4.0000 g | Freq: Four times a day (QID) | CUTANEOUS | 0 refills | Status: DC

## 2022-06-28 MED ORDER — ALBUTEROL SULFATE HFA 108 (90 BASE) MCG/ACT IN AERS: 2.0000 | INHALATION_SPRAY | Freq: Four times a day (QID) | RESPIRATORY_TRACT | 0 refills | Status: DC | PRN

## 2022-06-28 MED ORDER — ACYCLOVIR 400 MG PO TABS
400.0000 mg | ORAL_TABLET | Freq: Three times a day (TID) | ORAL | 0 refills | Status: AC
Start: 2022-06-28 — End: 2022-07-08

## 2022-06-28 NOTE — Patient Instructions (Signed)
Thank you for trusting me with your care. To recap, today we discussed the following:   Upper respiratory infection - azithromycin (ZITHROMAX Z-PAK) 250 MG tablet; Take 2 tablets (500 mg) PO today, then 1 tablet (250 mg) PO daily x4 days.  Dispense: 6 tablet; Refill: 0 - albuterol (VENTOLIN HFA) 108 (90 Base) MCG/ACT inhaler; Inhale 2 puffs into the lungs every 6 (six) hours as needed for wheezing or shortness of breath.  Dispense: 8 g; Refill: 0 - fluticasone (FLONASE) 50 MCG/ACT nasal spray; Place 2 sprays into both nostrils daily.  Dispense: 16 g; Refill: 0  Muscle cramp - diclofenac Sodium (VOLTAREN) 1 % GEL; Apply 4 g topically 4 (four) times daily.  Dispense: 150 g; Refill: 0  Herpes simplex - acyclovir (ZOVIRAX) 400 MG tablet; Take 1 tablet (400 mg total) by mouth 3 (three) times daily for 10 days.  Dispense: 30 tablet; Refill: 0

## 2022-06-28 NOTE — Telephone Encounter (Signed)
Patient called back wanting to know if her medicine has been filled yet.    Advised patient Dr. Hilda Lias will be in the office tomorrow   HYDROcodone-acetaminophen (NORCO/VICODIN) 5-325 MG tablet    Pharmacy:  [facility]

## 2022-06-29 ENCOUNTER — Other Ambulatory Visit: Payer: Self-pay | Admitting: Family Medicine

## 2022-06-29 DIAGNOSIS — N760 Acute vaginitis: Secondary | ICD-10-CM

## 2022-06-30 ENCOUNTER — Ambulatory Visit (HOSPITAL_COMMUNITY): Admission: RE | Admit: 2022-06-30 | Payer: Medicaid Other | Source: Ambulatory Visit

## 2022-06-30 DIAGNOSIS — J069 Acute upper respiratory infection, unspecified: Secondary | ICD-10-CM | POA: Insufficient documentation

## 2022-06-30 DIAGNOSIS — R252 Cramp and spasm: Secondary | ICD-10-CM | POA: Insufficient documentation

## 2022-06-30 DIAGNOSIS — B009 Herpesviral infection, unspecified: Secondary | ICD-10-CM | POA: Insufficient documentation

## 2022-06-30 NOTE — Telephone Encounter (Signed)
Please have the patient's schedule an office visit or a televisit for refill of metronidazole 500 mg

## 2022-06-30 NOTE — Progress Notes (Signed)
     CC: Cough (Lingering cough, not productive patient reports wheezing feels like she did when she had bronchitis hurts in her side, R calf pain.)    HPI:Susan [name] is a 46 y.o. female who presents for evaluation of lingering cough, muscle cramps, and herpes simplex. For the details of today's visit, please refer to the assessment and plan.   Past Medical History:  Diagnosis Date   Alcohol abuse    Asthma    BV (bacterial vaginosis) 03/06/2013   Chronic dental pain    Chronic pelvic pain in female    Depression    Ectopic pregnancy    GERD (gastroesophageal reflux disease)    Panic attacks    Polysubstance abuse (HCC)    cocaine, opiates, marijuana   Trichomonas vaginitis      Physical Exam: Vitals:   06/28/22 1608  BP: 116/77  Pulse: 83  SpO2: 98%  Weight: 150 lb 0.6 oz (68.1 kg)  Height: 5\' 1"  (1.549 m)     Physical Exam HENT:     Nose: Mucosal edema present.     Right Nostril: No occlusion.     Left Nostril: No occlusion.     Comments: Large nasal polyp in left nostril, patent    Mouth/Throat:     Mouth: Mucous membranes are moist.     Pharynx: No oropharyngeal exudate.     Comments: Small painful vesicles around lips Cardiovascular:     Rate and Rhythm: Normal rate and regular rhythm.     Heart sounds: No murmur heard. Pulmonary:     Effort: Pulmonary effort is normal.     Breath sounds: No stridor. Wheezing present. No rhonchi or rales.      Assessment & Plan:   URI (upper respiratory infection) Patient complains of symptoms of a URI. Symptoms include congestion, cough, and sore throat. She is also wheezing and has history of asthma. Onset of symptoms was  over week  ago . Treated for acute bronchitis last month. Current smoker.   Assessment/Plan: Upper respiratory tract infection, unspecified type - azithromycin (ZITHROMAX Z-PAK) 250 MG tablet; Take 2 tablets (500 mg) PO today, then 1 tablet (250 mg) PO daily x4 days.  Dispense: 6 tablet;  Refill: 0 - albuterol (VENTOLIN HFA) 108 (90 Base) MCG/ACT inhaler; Inhale 2 puffs into the lungs every 6 (six) hours as needed for wheezing or shortness of breath.  Dispense: 8 g; Refill: 0 - fluticasone (FLONASE) 50 MCG/ACT nasal spray; Place 2 sprays into both nostrils daily.  Dispense: 16 g; Refill: 0   Muscle cramps Experiencing muscle cramp in right calf. Will prescribe Voltaren Gel as needed for pain. Recommended increasing fluid intake.     Herpes simplex Patient presents with oral herpes infection.  - acyclovir (ZOVIRAX) 400 MG tablet; Take 1 tablet (400 mg total) by mouth 3 (three) times daily for 10 days.  Dispense: 30 tablet; Refill: 0     , MD

## 2022-06-30 NOTE — Assessment & Plan Note (Signed)
Patient presents with oral herpes infection.  - acyclovir (ZOVIRAX) 400 MG tablet; Take 1 tablet (400 mg total) by mouth 3 (three) times daily for 10 days.  Dispense: 30 tablet; Refill: 0

## 2022-06-30 NOTE — Assessment & Plan Note (Signed)
Experiencing muscle cramp in right calf. Will prescribe Voltaren Gel as needed for pain. Recommended increasing fluid intake.

## 2022-06-30 NOTE — Assessment & Plan Note (Addendum)
Patient complains of symptoms of a URI. Symptoms include congestion, cough, and sore throat. She is also wheezing and has history of asthma. Onset of symptoms was  over week  ago . Treated for acute bronchitis last month. Current smoker.   Assessment/Plan: Upper respiratory tract infection, unspecified type - azithromycin (ZITHROMAX Z-PAK) 250 MG tablet; Take 2 tablets (500 mg) PO today, then 1 tablet (250 mg) PO daily x4 days.  Dispense: 6 tablet; Refill: 0 - albuterol (VENTOLIN HFA) 108 (90 Base) MCG/ACT inhaler; Inhale 2 puffs into the lungs every 6 (six) hours as needed for wheezing or shortness of breath.  Dispense: 8 g; Refill: 0 - fluticasone (FLONASE) 50 MCG/ACT nasal spray; Place 2 sprays into both nostrils daily.  Dispense: 16 g; Refill: 0

## 2022-07-05 ENCOUNTER — Telehealth: Payer: Self-pay

## 2022-07-05 DIAGNOSIS — R102 Pelvic and perineal pain: Secondary | ICD-10-CM

## 2022-07-05 MED ORDER — HYDROCODONE-ACETAMINOPHEN 5-325 MG PO TABS: 1.0000 | ORAL_TABLET | Freq: Four times a day (QID) | ORAL | 0 refills | Status: DC | PRN

## 2022-07-05 NOTE — Telephone Encounter (Signed)
Request sent to provider.

## 2022-07-05 NOTE — Telephone Encounter (Signed)
Hydrocodone-Acetaminophen 5/325 MG  Qty 22 Tablets  PATIENT USES [facility]

## 2022-07-06 ENCOUNTER — Ambulatory Visit: Payer: Medicaid Other | Admitting: Orthopaedic Surgery

## 2022-07-12 ENCOUNTER — Telehealth: Payer: Self-pay | Admitting: Orthopaedic Surgery

## 2022-07-12 DIAGNOSIS — R102 Pelvic and perineal pain: Secondary | ICD-10-CM

## 2022-07-13 ENCOUNTER — Ambulatory Visit: Payer: Medicaid Other | Admitting: Orthopaedic Surgery

## 2022-07-19 ENCOUNTER — Telehealth: Payer: Self-pay | Admitting: Orthopaedic Surgery

## 2022-07-19 DIAGNOSIS — R102 Pelvic and perineal pain: Secondary | ICD-10-CM

## 2022-07-19 MED ORDER — HYDROCODONE-ACETAMINOPHEN 5-325 MG PO TABS: ORAL_TABLET | ORAL | 0 refills | Status: DC

## 2022-07-19 NOTE — Telephone Encounter (Signed)
Patient presented to the office requesting a refill on Hydrocodone 5-325, 18 tablets to be sent [facility].  She stated that she takes one every six hours and that she'll take her last one tomorrow.  She is scheduled for her MRI on 07/26/22 and has a return appointment scheduled for 07/28/22.  Pt's # [phone]

## 2022-07-19 NOTE — Telephone Encounter (Signed)
SENT TO PROVIDER  

## 2022-07-23 ENCOUNTER — Encounter: Payer: Self-pay | Admitting: Internal Medicine

## 2022-07-23 ENCOUNTER — Other Ambulatory Visit (HOSPITAL_COMMUNITY)
Admission: RE | Admit: 2022-07-23 | Discharge: 2022-07-23 | Disposition: A | Discharge status: Home / Self Care | Payer: Medicaid Other | Source: Ambulatory Visit | Attending: Internal Medicine | Admitting: Internal Medicine

## 2022-07-23 ENCOUNTER — Ambulatory Visit (INDEPENDENT_AMBULATORY_CARE_PROVIDER_SITE_OTHER): Payer: Medicaid Other | Admitting: Internal Medicine

## 2022-07-23 VITALS — BP 103/69 | HR 73 | Ht 61.0 in | Wt 153.4 lb

## 2022-07-23 DIAGNOSIS — J209 Acute bronchitis, unspecified: Secondary | ICD-10-CM

## 2022-07-23 DIAGNOSIS — F411 Generalized anxiety disorder: Secondary | ICD-10-CM | POA: Diagnosis not present

## 2022-07-23 DIAGNOSIS — N760 Acute vaginitis: Secondary | ICD-10-CM

## 2022-07-23 MED ORDER — ALBUTEROL SULFATE HFA 108 (90 BASE) MCG/ACT IN AERS: 2.0000 | INHALATION_SPRAY | Freq: Four times a day (QID) | RESPIRATORY_TRACT | 0 refills | Status: DC | PRN

## 2022-07-23 MED ORDER — HYDROXYZINE PAMOATE 25 MG PO CAPS: 25.0000 mg | ORAL_CAPSULE | Freq: Two times a day (BID) | ORAL | 0 refills | Status: DC | PRN

## 2022-07-23 MED ORDER — METHYLPREDNISOLONE 4 MG PO TBPK: ORAL_TABLET | ORAL | 0 refills | Status: DC

## 2022-07-23 NOTE — Progress Notes (Signed)
Acute Office Visit  Subjective:    Patient ID: Susan [name], female    DOB: [birth year], 46 y.o.   MRN: [id number]  Chief Complaint  Patient presents with   Vaginal Discharge    Patient states she is having a light yellow discharge with fishy odor for a week now. Having pain in lower abdomen. Patient is also wanting a refill on her inhaler, and she is having a persistent cough     HPI Patient is in today for complaint of whitish, fishy odor vaginal discharge for the last 2 weeks.  She also reports mild pelvic pain, which is chronic and intermittent.  She is sexually active currently, but reports that she does not trust her partner.  She denies any fever, chills, dysuria or hematuria.  She also complains of persistent cough and dyspnea upon exertion.  Of note, she smokes currently and admits that she needs to cut down.  She denies any fever, chills or hemoptysis.  She had been treated with azithromycin and Medrol Dosepak for URTI in the last month.  In addition, she also reports persistent anxiety.  She used to take Xanax in the past.  She is currently not on any anxiolytic agent.  Denies any SI or HI currently.  Of note, chart review suggests history of polysubstance abuse.   Past Medical History:  Diagnosis Date   Alcohol abuse    Asthma    BV (bacterial vaginosis) 03/06/2013   Chronic dental pain    Chronic pelvic pain in female    Depression    Ectopic pregnancy    GERD (gastroesophageal reflux disease)    Panic attacks    Polysubstance abuse (HCC)    cocaine, opiates, marijuana   Trichomonas vaginitis     Past Surgical History:  Procedure Laterality Date   ECTOPIC PREGNANCY SURGERY      Family History  Problem Relation Age of Onset   Diabetes Mother    COPD Mother    Stroke Mother    Asthma Sister    Down syndrome Sister    Heart disease Maternal Grandmother    Hypertension Maternal Grandmother    Stroke Maternal Grandfather     Social History    Socioeconomic History   Marital status: Single    Spouse name: Not on file   Number of children: Not on file   Years of education: Not on file   Highest education level: Not on file  Occupational History   Not on file  Tobacco Use   Smoking status: Every Day    Packs/day: 2.00    Years: 26.00    Total pack years: 52.00    Types: Cigarettes   Smokeless tobacco: Never  Vaping Use   Vaping Use: Some days  Substance and Sexual Activity   Alcohol use: Yes    Alcohol/week: 2.0 standard drinks of alcohol    Types: 2 Cans of beer per week    Comment: 2-3 cans of beer every other day   Drug use: Yes    Types: Cocaine    Comment: last week as of 08/03/21   Sexual activity: Yes    Birth control/protection: None  Other Topics Concern   Not on file  Social History Narrative   Not on file   Social Determinants of Health   Financial Resource Strain: Not on file  Food Insecurity: Not on file  Transportation Needs: Not on file  Physical Activity: Not on file  Stress: Not on file  Social Connections: Not on file  Intimate Partner Violence: Not on file    Outpatient Medications Prior to Visit  Medication Sig Dispense Refill   diclofenac Sodium (VOLTAREN) 1 % GEL Apply 4 g topically 4 (four) times daily. 150 g 0   fluticasone (FLONASE) 50 MCG/ACT nasal spray Place 2 sprays into both nostrils daily. 16 g 0   HYDROcodone-acetaminophen (NORCO/VICODIN) 5-325 MG tablet TAKE 1 TABLET EVERY SIX HOURS AS NEEDED FOR PAIN. MUST LAST 7 DAYS 18 tablet 0   naproxen (NAPROSYN) 500 MG tablet Take 1 tablet (500 mg total) by mouth 2 (two) times daily with a meal. (Patient not taking: Reported on 06/28/2022) 60 tablet 5   albuterol (VENTOLIN HFA) 108 (90 Base) MCG/ACT inhaler Inhale 2 puffs into the lungs every 6 (six) hours as needed for wheezing or shortness of breath. 8 g 0   azithromycin (ZITHROMAX Z-PAK) 250 MG tablet Take 2 tablets (500 mg) PO today, then 1 tablet (250 mg) PO daily x4 days. 6  tablet 0   methylPREDNISolone (MEDROL DOSEPAK) 4 MG TBPK tablet Take as the package instructed (Patient not taking: Reported on 06/28/2022) 1 each 0   promethazine-dextromethorphan (PROMETHAZINE-DM) 6.25-15 MG/5ML syrup Take 1.3 mLs by mouth 4 (four) times daily as needed for cough. (Patient not taking: Reported on 06/28/2022) 118 mL 0   No facility-administered medications prior to visit.    No Known Allergies  Review of Systems  Constitutional:  Negative for chills and fever.  HENT:  Positive for congestion, sinus pressure and sinus pain.   Respiratory:  Positive for cough and shortness of breath.   Cardiovascular:  Negative for chest pain and palpitations.  Gastrointestinal:  Negative for diarrhea, nausea and vomiting.  Genitourinary:  Positive for vaginal discharge. Negative for dysuria and hematuria.  Musculoskeletal:  Positive for back pain. Negative for neck pain and neck stiffness.  Skin:  Negative for rash.  Neurological:  Negative for dizziness and weakness.  Psychiatric/Behavioral:  Positive for sleep disturbance. Negative for agitation and behavioral problems. The patient is nervous/anxious.        Objective:    Physical Exam Vitals reviewed.  Constitutional:      General: She is not in acute distress.    Appearance: She is not diaphoretic.  HENT:     Head: Normocephalic and atraumatic.     Nose: Congestion present.     Mouth/Throat:     Mouth: Mucous membranes are moist.  Eyes:     General: No scleral icterus.    Extraocular Movements: Extraocular movements intact.     Pupils: Pupils are equal, round, and reactive to light.  Cardiovascular:     Rate and Rhythm: Normal rate and regular rhythm.     Heart sounds: Normal heart sounds. No murmur heard. Pulmonary:     Breath sounds: Wheezing (Diffuse, mild lateral) present. No rales.  Abdominal:     Palpations: Abdomen is soft.     Tenderness: There is no abdominal tenderness.  Musculoskeletal:     Cervical  back: Neck supple. No tenderness.     Right lower leg: No edema.     Left lower leg: No edema.  Skin:    General: Skin is warm.     Findings: No rash.  Neurological:     General: No focal deficit present.     Mental Status: She is alert and oriented to person, place, and time.  Psychiatric:        Mood and Affect: Mood normal.  Behavior: Behavior normal.     BP 103/69 (BP Location: Left Arm, Patient Position: Sitting, Cuff Size: Normal)   Pulse 73   Ht 5\' 1"  (1.549 m)   Wt 153 lb 6.4 oz (69.6 kg)   SpO2 97%   BMI 28.98 kg/m  Wt Readings from Last 3 Encounters:  07/23/22 153 lb 6.4 oz (69.6 kg)  06/28/22 150 lb 0.6 oz (68.1 kg)  06/22/22 160 lb (72.6 kg)        Assessment & Plan:   Problem List Items Addressed This Visit       Respiratory   Acute bronchitis    Has URTI symptoms and has exertional dyspnea and wheezing Started Medrol dosepak Refilled albuterol inhaler as needed for dyspnea or wheezing      Relevant Medications   methylPREDNISolone (MEDROL DOSEPAK) 4 MG TBPK tablet   albuterol (VENTOLIN HFA) 108 (90 Base) MCG/ACT inhaler     Genitourinary   Acute vaginitis - Primary    Check with vaginal swab Has history of BV      Relevant Orders   Cervicovaginal ancillary only     Other   GAD (generalized anxiety disorder)    Currently uncontrolled Used to take Xanax, but would avoid due to history of polysubstance abuse Started on hydroxyzine as needed for now Will defer to start SSRI to PCP      Relevant Medications   hydrOXYzine (VISTARIL) 25 MG capsule     Meds ordered this encounter  Medications   hydrOXYzine (VISTARIL) 25 MG capsule    Sig: Take 1 capsule (25 mg total) by mouth every 12 (twelve) hours as needed.    Dispense:  30 capsule    Refill:  0   methylPREDNISolone (MEDROL DOSEPAK) 4 MG TBPK tablet    Sig: Take as package instructions.    Dispense:  1 each    Refill:  0   albuterol (VENTOLIN HFA) 108 (90 Base) MCG/ACT  inhaler    Sig: Inhale 2 puffs into the lungs every 6 (six) hours as needed for wheezing or shortness of breath.    Dispense:  8 g    Refill:  0     Kariana Wiles 06/24/22, MD

## 2022-07-23 NOTE — Assessment & Plan Note (Signed)
Has URTI symptoms and has exertional dyspnea and wheezing Started Medrol dosepak Refilled albuterol inhaler as needed for dyspnea or wheezing

## 2022-07-23 NOTE — Assessment & Plan Note (Signed)
Currently uncontrolled Used to take Xanax, but would avoid due to history of polysubstance abuse Started on hydroxyzine as needed for now Will defer to start SSRI to PCP

## 2022-07-23 NOTE — Patient Instructions (Signed)
Please take Prednisone for bronchitis. Please take Robitussin DM Max for cough.  Please take Hydroxyzine for anxiety.  Please use Albuterol inhaler as needed for shortness of breath or wheezing.

## 2022-07-23 NOTE — Assessment & Plan Note (Signed)
Check with vaginal swab Has history of BV

## 2022-07-26 ENCOUNTER — Telehealth: Payer: Self-pay | Admitting: Family Medicine

## 2022-07-26 ENCOUNTER — Ambulatory Visit (HOSPITAL_COMMUNITY)
Admission: RE | Admit: 2022-07-26 | Discharge: 2022-07-26 | Disposition: A | Discharge status: Home / Self Care | Payer: Medicaid Other | Source: Ambulatory Visit | Attending: Orthopaedic Surgery | Admitting: Orthopaedic Surgery

## 2022-07-26 DIAGNOSIS — M545 Low back pain, unspecified: Secondary | ICD-10-CM | POA: Insufficient documentation

## 2022-07-26 LAB — CERVICOVAGINAL ANCILLARY ONLY
Bacterial Vaginitis (gardnerella): POSITIVE — AB
Candida Glabrata: NEGATIVE
Candida Vaginitis: NEGATIVE
Chlamydia: NEGATIVE
Comment: NEGATIVE
Comment: NEGATIVE
Comment: NEGATIVE
Comment: NEGATIVE
Comment: NEGATIVE
Comment: NORMAL
Neisseria Gonorrhea: NEGATIVE
Trichomonas: POSITIVE — AB

## 2022-07-26 MED ORDER — METRONIDAZOLE 500 MG PO TABS: 500.0000 mg | ORAL_TABLET | Freq: Two times a day (BID) | ORAL | 0 refills | Status: AC

## 2022-07-26 NOTE — Telephone Encounter (Signed)
Patient returned the call, she stated she missed it because she was having a MRI done.  Pt's # [phone]

## 2022-07-26 NOTE — Telephone Encounter (Signed)
Lmtrc

## 2022-07-26 NOTE — Addendum Note (Signed)
Addended byTrena Platt on: 07/26/2022 03:13 PM   Modules accepted: Orders

## 2022-07-26 NOTE — Telephone Encounter (Signed)
Patient calling about test results swab. Call # [phone]

## 2022-07-27 NOTE — Telephone Encounter (Signed)
Patient returning a call to nurse

## 2022-07-27 NOTE — Telephone Encounter (Signed)
She is in lobby her phone is broke

## 2022-07-28 ENCOUNTER — Telehealth: Payer: Self-pay

## 2022-07-28 ENCOUNTER — Ambulatory Visit: Payer: Medicaid Other | Admitting: Orthopaedic Surgery

## 2022-07-28 DIAGNOSIS — R102 Pelvic and perineal pain: Secondary | ICD-10-CM

## 2022-07-28 MED ORDER — HYDROCODONE-ACETAMINOPHEN 5-325 MG PO TABS: ORAL_TABLET | ORAL | 0 refills | Status: DC

## 2022-07-28 NOTE — Telephone Encounter (Signed)
Hydrocodone-Acetaminophen 5/325 MG  Qty 15 Tablets  PATIENT USES [facility]

## 2022-07-29 ENCOUNTER — Telehealth: Payer: Self-pay | Admitting: Radiology

## 2022-07-29 DIAGNOSIS — M48061 Spinal stenosis, lumbar region without neurogenic claudication: Secondary | ICD-10-CM

## 2022-07-29 NOTE — Telephone Encounter (Signed)
Dr Hilda Lias wants me to call patient, she has some nerve irritation that showed on her lumbar MRI scan and she needs to see neurosurgery I called patient and put in the referral Need to give  her the number to call if she doesn't hear from them soon [phone].  I called and left message for her to call back

## 2022-08-04 ENCOUNTER — Other Ambulatory Visit: Payer: Self-pay | Admitting: Radiology

## 2022-08-04 DIAGNOSIS — R102 Pelvic and perineal pain: Secondary | ICD-10-CM

## 2022-08-04 MED ORDER — HYDROCODONE-ACETAMINOPHEN 5-325 MG PO TABS: ORAL_TABLET | ORAL | 0 refills | Status: DC

## 2022-08-04 NOTE — Telephone Encounter (Signed)
Patient called and requested refill hydrocodone to be sent to [facility].  I advised Dr Hilda Lias is out of office, and another provider would have to advise.  Can you please send to Dr Romeo Apple? Thanks.

## 2022-08-11 ENCOUNTER — Encounter: Payer: Self-pay | Admitting: Orthopaedic Surgery

## 2022-08-11 ENCOUNTER — Ambulatory Visit: Payer: Medicaid Other | Admitting: Orthopaedic Surgery

## 2022-08-11 VITALS — BP 126/75 | HR 79 | Ht 61.0 in | Wt 152.0 lb

## 2022-08-11 DIAGNOSIS — M48061 Spinal stenosis, lumbar region without neurogenic claudication: Secondary | ICD-10-CM

## 2022-08-11 DIAGNOSIS — M545 Low back pain, unspecified: Secondary | ICD-10-CM

## 2022-08-11 MED ORDER — HYDROCODONE-ACETAMINOPHEN 5-325 MG PO TABS: ORAL_TABLET | ORAL | 0 refills | Status: DC

## 2022-08-11 NOTE — Patient Instructions (Signed)
You have been referred to [facility] on [address] in . They will call you to schedule the appointment. If you have not heard anything in one week call them to schedule [phone]. This referral has to be uploaded to another system because they are not on the same medical records system as we are. Please allow us two business days to get this uploaded for you.   

## 2022-08-11 NOTE — Progress Notes (Signed)
My back hurts more  She had MRI of the lumbar spine which showed: IMPRESSION: 1. Mild multilevel degenerative changes of the lumbar spine as described above. Shallow left subarticular disc protrusion and annular fissure at L5-S1 with mild-to-moderate left lateral recess stenosis that could affect the descending left S1 nerve root.  I have explained the findings to her.  I will have neurosurgery see her for further evaluation.  She has good ROM of the lumbar spine, NV intact, SLR negative, normal gait, normal muscle strength and tone.  I have independently reviewed the MRI.    Encounter Diagnoses  Name Primary?   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  I have reviewed the [place] web site prior to prescribing narcotic medicine for this patient.  Return in six weeks.  Call if any problem.  Precautions discussed.  Electronically Signed Sanjuana Kava, MD 1/3/20248:21 AM

## 2022-08-17 ENCOUNTER — Encounter: Payer: Self-pay | Admitting: Family Medicine

## 2022-08-17 ENCOUNTER — Ambulatory Visit: Payer: Medicaid Other | Admitting: Family Medicine

## 2022-08-17 ENCOUNTER — Telehealth: Payer: Self-pay | Admitting: Family Medicine

## 2022-08-17 NOTE — Telephone Encounter (Signed)
Kindly proceed with the clinic policy, given the patient has been fully aware of the clinic's policy and subsequent actions.

## 2022-08-17 NOTE — Telephone Encounter (Signed)
Good morning, pt has now missed 4 appts (08/17/22, 05/14/22, 05/07/22 & 03/24/22). At her last no show there was a note to give her one last chance. How would you like to proceed?

## 2022-08-18 ENCOUNTER — Other Ambulatory Visit: Payer: Self-pay | Admitting: Orthopaedic Surgery

## 2022-08-18 NOTE — Telephone Encounter (Signed)
Patient came in wanting to know when her next appt is and to get a refill on her pain medicine  She only wants a weeks supply so medicaid will pay   HYDROcodone-acetaminophen (NORCO/VICODIN) 5-325 MG tablet    Pharmacy; [facility]

## 2022-08-24 ENCOUNTER — Telehealth: Payer: Self-pay | Admitting: Orthopaedic Surgery

## 2022-08-24 MED ORDER — HYDROCODONE-ACETAMINOPHEN 5-325 MG PO TABS: ORAL_TABLET | ORAL | 0 refills | Status: DC

## 2022-08-24 NOTE — Telephone Encounter (Signed)
Patient lvm requesting a refill on her Hydrocodone 5-325 to be sent to [facility].  Pt's # [phone]

## 2022-08-24 NOTE — Telephone Encounter (Signed)
SENT TO PROVIDER  

## 2022-09-01 ENCOUNTER — Other Ambulatory Visit: Payer: Self-pay

## 2022-09-01 ENCOUNTER — Emergency Department (HOSPITAL_COMMUNITY): Payer: Medicaid Other

## 2022-09-01 ENCOUNTER — Emergency Department (HOSPITAL_COMMUNITY)
Admission: EM | Admit: 2022-09-01 | Discharge: 2022-09-01 | Disposition: A | Discharge status: Home / Self Care | Payer: Medicaid Other | Attending: Emergency Medicine | Admitting: Emergency Medicine

## 2022-09-01 ENCOUNTER — Encounter (HOSPITAL_COMMUNITY): Payer: Self-pay | Admitting: *Deleted

## 2022-09-01 DIAGNOSIS — R0781 Pleurodynia: Secondary | ICD-10-CM | POA: Diagnosis present

## 2022-09-01 DIAGNOSIS — J45909 Unspecified asthma, uncomplicated: Secondary | ICD-10-CM | POA: Diagnosis not present

## 2022-09-01 DIAGNOSIS — Z7951 Long term (current) use of inhaled steroids: Secondary | ICD-10-CM | POA: Diagnosis not present

## 2022-09-01 DIAGNOSIS — S20212A Contusion of left front wall of thorax, initial encounter: Secondary | ICD-10-CM | POA: Diagnosis not present

## 2022-09-01 DIAGNOSIS — S299XXA Unspecified injury of thorax, initial encounter: Secondary | ICD-10-CM

## 2022-09-01 MED ORDER — BENZONATATE 100 MG PO CAPS: 100.0000 mg | ORAL_CAPSULE | Freq: Three times a day (TID) | ORAL | 0 refills | Status: DC

## 2022-09-01 MED ORDER — OXYCODONE HCL 5 MG PO TABS
5.0000 mg | ORAL_TABLET | Freq: Once | ORAL | Status: AC
  Administered 2022-09-01: 5 mg via ORAL
  Filled 2022-09-01: qty 1

## 2022-09-01 MED ORDER — BENZONATATE 100 MG PO CAPS
100.0000 mg | ORAL_CAPSULE | Freq: Once | ORAL | Status: AC
  Administered 2022-09-01: 100 mg via ORAL
  Filled 2022-09-01: qty 1
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
## 2022-09-01 NOTE — ED Notes (Signed)
Pt states she has a ride home, instructed pt not to drive for next 4 hours at least due to oxycodone and may cause drowsiness. Pt verbalized understanding.

## 2022-09-01 NOTE — ED Notes (Signed)
Patient transported to X-ray 

## 2022-09-06 ENCOUNTER — Telehealth: Payer: Self-pay | Admitting: Orthopaedic Surgery

## 2022-09-06 MED ORDER — HYDROCODONE-ACETAMINOPHEN 5-325 MG PO TABS: ORAL_TABLET | ORAL | 0 refills | Status: DC

## 2022-09-06 NOTE — Telephone Encounter (Signed)
I forgot to route it

## 2022-09-06 NOTE — Telephone Encounter (Signed)
Refill request sent to provider.
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
## 2022-09-15 NOTE — Congregational Nurse Program (Signed)
participant attended her first session of Med instead of Meds phytoRx class with focus on "swapping fats" and sampling a recipe (spicy roasted cauliflower) as example of meditterrean way diet. Recipes were provided by faciliitator of [place] location  Participant did sample recipe and did state this would be a recipe she would be willing to try this recipe for her family and herself.  Participant states she does not have hypertension or diabetes, but does have some eye/vision issues that she would like to improve upon.  Her phytorx prescription was not completed on today, however will be provided and discussed more at next meeting class of phytorx    Participant does shop at the on-site food market, but has not on this week to date.  She will be encouraged to shop within the market more to implement the practice of making healthier choices when shopping and considering the mediterrean way of selecting foods.      Participant plans on attending the next phytorx class on 2.14.24

## 2022-09-15 NOTE — Congregational Nurse Program (Signed)
Pt requested assistance with obtaining a new provider and learning more about her services with Medicaid   Plan  Assisted pt with obtaining a new PCP within Medicaid network . Pt was connected with Medicaid [place] [facility] [place] to obtain a list of providers that will be mailed to her home in addition to  email.    Pt was able to locate a PCP within network of Medicaid and did schedule a new pt appointment with [facility] for November 30, 2022 at 1:40pm   Pt resolvied all requests while in my presence on today and by way of phone while on-site at [employer] stated she was satisfied with services and left

## 2022-09-18 ENCOUNTER — Telehealth: Payer: Self-pay | Admitting: Orthopaedic Surgery

## 2022-09-18 ENCOUNTER — Other Ambulatory Visit: Payer: Self-pay | Admitting: Orthopaedic Surgery

## 2022-09-20 MED ORDER — HYDROCODONE-ACETAMINOPHEN 5-325 MG PO TABS: ORAL_TABLET | ORAL | 0 refills | Status: DC

## 2022-09-22 ENCOUNTER — Ambulatory Visit: Payer: Medicaid Other | Admitting: Orthopaedic Surgery

## 2022-09-22 ENCOUNTER — Encounter: Payer: Self-pay | Admitting: Orthopaedic Surgery

## 2022-09-22 VITALS — BP 117/62 | HR 93 | Ht 61.0 in | Wt 144.0 lb

## 2022-09-22 DIAGNOSIS — F17210 Nicotine dependence, cigarettes, uncomplicated: Secondary | ICD-10-CM | POA: Diagnosis not present

## 2022-09-22 DIAGNOSIS — M545 Low back pain, unspecified: Secondary | ICD-10-CM

## 2022-09-22 DIAGNOSIS — M48061 Spinal stenosis, lumbar region without neurogenic claudication: Secondary | ICD-10-CM

## 2022-09-22 NOTE — Progress Notes (Signed)
My back hurts.  She was seen by neurosurgery and they will do epidural injections.  She needs to schedule that.  She has no new trauma.  She is taking her medicine.  I called in refill two days ago.  Spine/Pelvis examination:  Inspection:  Overall, sacoiliac joint benign and hips nontender; without crepitus or defects.   Thoracic spine inspection: Alignment normal without kyphosis present   Lumbar spine inspection:  Alignment  with normal lumbar lordosis, without scoliosis apparent.   Thoracic spine palpation:  without tenderness of spinal processes   Lumbar spine palpation: without tenderness of lumbar area; without tightness of lumbar muscles    Range of Motion:   Lumbar flexion, forward flexion is normal without pain or tenderness    Lumbar extension is full without pain or tenderness   Left lateral bend is normal without pain or tenderness   Right lateral bend is normal without pain or tenderness   Straight leg raising is normal  Strength & tone: normal   Stability overall normal stability  Encounter Diagnoses  Name Primary?   Stenosis of lateral recess of lumbar spine Yes   Lumbar pain    Nicotine dependence, cigarettes, uncomplicated    To see neurosurgery.  Return here in two months.  Call if any problem.  Precautions discussed.  Electronically Signed Sanjuana Kava, MD 2/14/20249:16 AM

## 2022-09-29 ENCOUNTER — Other Ambulatory Visit: Payer: Self-pay | Admitting: Orthopaedic Surgery

## 2022-09-30 ENCOUNTER — Other Ambulatory Visit: Payer: Self-pay | Admitting: Orthopaedic Surgery

## 2022-09-30 NOTE — Telephone Encounter (Signed)
Patient came in requesting refill for her pain medicine   HYDROcodone-acetaminophen (NORCO/VICODIN) 5-325 MG tablet   Pharmcy: [facility]

## 2022-10-05 ENCOUNTER — Encounter: Payer: Self-pay | Admitting: Orthopaedic Surgery

## 2022-10-05 ENCOUNTER — Ambulatory Visit (INDEPENDENT_AMBULATORY_CARE_PROVIDER_SITE_OTHER): Payer: Medicaid Other | Admitting: Orthopaedic Surgery

## 2022-10-05 VITALS — BP 112/73 | HR 92 | Ht 61.0 in | Wt 144.0 lb

## 2022-10-05 DIAGNOSIS — G894 Chronic pain syndrome: Secondary | ICD-10-CM

## 2022-10-05 DIAGNOSIS — M545 Low back pain, unspecified: Secondary | ICD-10-CM

## 2022-10-05 DIAGNOSIS — M48061 Spinal stenosis, lumbar region without neurogenic claudication: Secondary | ICD-10-CM

## 2022-10-05 DIAGNOSIS — F17210 Nicotine dependence, cigarettes, uncomplicated: Secondary | ICD-10-CM | POA: Diagnosis not present

## 2022-10-05 MED ORDER — NAPROXEN 500 MG PO TABS: 500.0000 mg | ORAL_TABLET | Freq: Two times a day (BID) | ORAL | 5 refills | Status: DC

## 2022-10-05 NOTE — Progress Notes (Signed)
My back hurts.  She decided not to have epidural injections.  I will send her to pain management.  She has chronic pain of the lower back but ROM full.  Spine/Pelvis examination:  Inspection:  Overall, sacoiliac joint benign and hips nontender; without crepitus or defects.   Thoracic spine inspection: Alignment normal without kyphosis present   Lumbar spine inspection:  Alignment  with normal lumbar lordosis, without scoliosis apparent.   Thoracic spine palpation:  without tenderness of spinal processes   Lumbar spine palpation: without tenderness of lumbar area; without tightness of lumbar muscles    Range of Motion:   Lumbar flexion, forward flexion is normal without pain or tenderness    Lumbar extension is full without pain or tenderness   Left lateral bend is normal without pain or tenderness   Right lateral bend is normal without pain or tenderness   Straight leg raising is normal  Strength & tone: normal   Stability overall normal stability  Encounter Diagnoses  Name Primary?   Stenosis of lateral recess of lumbar spine Yes   Lumbar pain    Chronic pain syndrome    I have refilled her Naprosyn.  Return in six weeks.  I have reviewed the [facility] web site prior to prescribing narcotic medicine for this patient.  Go to pain management.  Call if any problem.  Precautions discussed.  Electronically Signed Sanjuana Kava, MD 2/27/202410:04 AM

## 2022-10-05 NOTE — Patient Instructions (Signed)
Steps to Quit Smoking Smoking tobacco is the leading cause of preventable death. It can affect almost every organ in the body. Smoking puts you and people around you at risk for many serious, long-lasting (chronic) diseases. Quitting smoking can be hard, but it is one of the best things that you can do for your health. It is never too late to quit. Do not give up if you cannot quit the first time. Some people need to try many times to quit. Do your best to stick to your quit plan, and talk with your doctor if you have any questions or concerns. How do I get ready to quit? Pick a date to quit. Set a date within the next 2 weeks to give you time to prepare. Write down the reasons why you are quitting. Keep this list in places where you will see it often. Tell your family, friends, and co-workers that you are quitting. Their support is important. Talk with your doctor about the choices that may help you quit. Find out if your health insurance will pay for these treatments. Know the people, places, things, and activities that make you want to smoke (triggers). Avoid them. What first steps can I take to quit smoking? Throw away all cigarettes at home, at work, and in your car. Throw away the things that you use when you smoke, such as ashtrays and lighters. Clean your car. Empty the ashtray. Clean your home, including curtains and carpets. What can I do to help me quit smoking? Talk with your doctor about taking medicines and seeing a counselor. You are more likely to succeed when you do both. If you are pregnant or breastfeeding: Talk with your doctor about counseling or other ways to quit smoking. Do not take medicine to help you quit smoking unless your doctor tells you to. Quit right away Quit smoking completely, instead of slowly cutting back on how much you smoke over a period of time. Stopping smoking right away may be more successful than slowly quitting. Go to counseling. In-person is best  if this is an option. You are more likely to quit if you go to counseling sessions regularly. Take medicine You may take medicines to help you quit. Some medicines need a prescription, and some you can buy over-the-counter. Some medicines may contain a drug called nicotine to replace the nicotine in cigarettes. Medicines may: Help you stop having the desire to smoke (cravings). Help to stop the problems that come when you stop smoking (withdrawal symptoms). Your doctor may ask you to use: Nicotine patches, gum, or lozenges. Nicotine inhalers or sprays. Non-nicotine medicine that you take by mouth. Find resources Find resources and other ways to help you quit smoking and remain smoke-free after you quit. They include: Online chats with a counselor. Phone quitlines. Printed self-help materials. Support groups or group counseling. Text messaging programs. Mobile phone apps. Use apps on your mobile phone or tablet that can help you stick to your quit plan. Examples of free services include Quit Guide from the CDC and smokefree.gov  What can I do to make it easier to quit?  Talk to your family and friends. Ask them to support and encourage you. Call a phone quitline, such as 1-800-QUIT-NOW, reach out to support groups, or work with a counselor. Ask people who smoke to not smoke around you. Avoid places that make you want to smoke, such as: Bars. Parties. Smoke-break areas at work. Spend time with people who do not smoke. Lower   the stress in your life. Stress can make you want to smoke. Try these things to lower stress: Getting regular exercise. Doing deep-breathing exercises. Doing yoga. Meditating. What benefits will I see if I quit smoking? Over time, you may have: A better sense of smell and taste. Less coughing and sore throat. A slower heart rate. Lower blood pressure. Clearer skin. Better breathing. Fewer sick days. Summary Quitting smoking can be hard, but it is one of  the best things that you can do for your health. Do not give up if you cannot quit the first time. Some people need to try many times to quit. When you decide to quit smoking, make a plan to help you succeed. Quit smoking right away, not slowly over a period of time. When you start quitting, get help and support to keep you smoke-free. This information is not intended to replace advice given to you by your health care provider. Make sure you discuss any questions you have with your health care provider. Document Revised: 07/17/2021 Document Reviewed: 07/17/2021 Elsevier Patient Education  2023 Elsevier Inc.  

## 2022-10-06 ENCOUNTER — Other Ambulatory Visit: Payer: Self-pay | Admitting: Orthopaedic Surgery

## 2022-10-06 ENCOUNTER — Telehealth: Payer: Self-pay | Admitting: Orthopaedic Surgery

## 2022-10-06 NOTE — Telephone Encounter (Signed)
Patient lvm requesting a refill for her Hydrocodone 5-325 to be sent to [facility]

## 2022-10-06 NOTE — Telephone Encounter (Signed)
Patient returned to the office stating her pain medication was refilled. Only the Naproxen. Advised patient I would send a message but she had been referred to pain management.

## 2022-10-06 NOTE — Telephone Encounter (Signed)
Patient came in the office to get her pain medicine refilled.  She said the nurse told her to call back today, but her phone is messing up so she has came in the office.  She said Dr. Luna Glasgow was going to refill her pain medicine and after that she was going to have to go to pain management.   Pharmacy: [facility]   Since she has a ride she is going to get her phone fixed, if you need to call her.

## 2022-10-07 ENCOUNTER — Encounter: Payer: Self-pay | Admitting: Radiology

## 2022-10-07 NOTE — Telephone Encounter (Signed)
Spoke with patient regarding pain medication. Per provider, he will not refill pain medication. He has referred her to pain management. She will have to take the Naproxen until she is seen by them. She verbalized understanding of treatment plan going forward.

## 2022-11-11 ENCOUNTER — Emergency Department (HOSPITAL_COMMUNITY)
Admission: EM | Admit: 2022-11-11 | Discharge: 2022-11-11 | Disposition: A | Discharge status: Home / Self Care | Payer: Medicaid Other | Attending: Emergency Medicine | Admitting: Emergency Medicine

## 2022-11-11 ENCOUNTER — Other Ambulatory Visit: Payer: Self-pay

## 2022-11-11 ENCOUNTER — Encounter (HOSPITAL_COMMUNITY): Payer: Self-pay | Admitting: Emergency Medicine

## 2022-11-11 ENCOUNTER — Emergency Department (HOSPITAL_COMMUNITY): Payer: Medicaid Other

## 2022-11-11 DIAGNOSIS — S20212A Contusion of left front wall of thorax, initial encounter: Secondary | ICD-10-CM | POA: Diagnosis not present

## 2022-11-11 DIAGNOSIS — R079 Chest pain, unspecified: Secondary | ICD-10-CM | POA: Diagnosis present

## 2022-11-11 MED ORDER — BENZONATATE 100 MG PO CAPS: 100.0000 mg | ORAL_CAPSULE | Freq: Four times a day (QID) | ORAL | 0 refills | Status: DC | PRN

## 2022-11-11 MED ORDER — HYDROCODONE-ACETAMINOPHEN 5-325 MG PO TABS
1.0000 | ORAL_TABLET | Freq: Once | ORAL | Status: AC
  Administered 2022-11-11: 1 via ORAL
  Filled 2022-11-11: qty 1
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
## 2022-11-11 NOTE — ED Triage Notes (Signed)
Pt c/o left rib pain x 4 days. Pt states the area is bruised but she doesn't know how she injured herself.

## 2022-11-11 NOTE — ED Notes (Signed)
Dc instructions and scripts reviewed with pt no questions or concerns at this time. Will follow up with pain management. Ambulated out of ED with steady gait

## 2022-11-11 NOTE — Discharge Instructions (Addendum)
Follow up with pain mangement

## 2022-11-16 ENCOUNTER — Ambulatory Visit: Payer: Medicaid Other | Admitting: Orthopaedic Surgery

## 2022-11-23 ENCOUNTER — Ambulatory Visit: Payer: Medicaid Other | Admitting: Orthopaedic Surgery

## 2022-11-30 ENCOUNTER — Encounter: Payer: Self-pay | Admitting: Family Medicine

## 2022-11-30 ENCOUNTER — Ambulatory Visit (INDEPENDENT_AMBULATORY_CARE_PROVIDER_SITE_OTHER): Payer: Medicaid Other | Admitting: Family Medicine

## 2022-11-30 DIAGNOSIS — B001 Herpesviral vesicular dermatitis: Secondary | ICD-10-CM

## 2022-11-30 DIAGNOSIS — Z124 Encounter for screening for malignant neoplasm of cervix: Secondary | ICD-10-CM

## 2022-11-30 DIAGNOSIS — Z720 Tobacco use: Secondary | ICD-10-CM | POA: Diagnosis not present

## 2022-11-30 DIAGNOSIS — F411 Generalized anxiety disorder: Secondary | ICD-10-CM

## 2022-11-30 DIAGNOSIS — J209 Acute bronchitis, unspecified: Secondary | ICD-10-CM

## 2022-11-30 MED ORDER — VALACYCLOVIR HCL 1 G PO TABS: 1000.0000 mg | ORAL_TABLET | Freq: Two times a day (BID) | ORAL | 0 refills | Status: AC

## 2022-11-30 MED ORDER — VALACYCLOVIR HCL 500 MG PO TABS: 500.0000 mg | ORAL_TABLET | Freq: Every day | ORAL | 3 refills | Status: DC

## 2022-11-30 MED ORDER — HYDROXYZINE PAMOATE 25 MG PO CAPS: 25.0000 mg | ORAL_CAPSULE | Freq: Three times a day (TID) | ORAL | 3 refills | Status: DC | PRN

## 2022-11-30 MED ORDER — BUPROPION HCL ER (SR) 150 MG PO TB12: 150.0000 mg | ORAL_TABLET | Freq: Two times a day (BID) | ORAL | 3 refills | Status: DC

## 2022-11-30 MED ORDER — SULINDAC 200 MG PO TABS: 200.0000 mg | ORAL_TABLET | Freq: Two times a day (BID) | ORAL | 0 refills | Status: DC

## 2022-11-30 MED ORDER — ALBUTEROL SULFATE HFA 108 (90 BASE) MCG/ACT IN AERS: 2.0000 | INHALATION_SPRAY | Freq: Four times a day (QID) | RESPIRATORY_TRACT | 3 refills | Status: DC | PRN

## 2022-11-30 NOTE — Patient Instructions (Addendum)
Call [phone] to schedule mammogram.  Medications as prescribed.  Referral to ObGyn placed.  Follow up in 6 months.  Take care  Dr. Adriana Simas

## 2022-12-01 DIAGNOSIS — B001 Herpesviral vesicular dermatitis: Secondary | ICD-10-CM | POA: Insufficient documentation
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
## 2022-12-01 NOTE — Assessment & Plan Note (Signed)
Starting Wellbutrin for smoking cessation. 

## 2022-12-01 NOTE — Assessment & Plan Note (Signed)
Restarting hydroxyzine. 

## 2022-12-01 NOTE — Assessment & Plan Note (Signed)
Sulindac for pain.

## 2022-12-01 NOTE — Assessment & Plan Note (Signed)
Treating current outbreak with Valtrex.  Then starting Valtrex prophylactically.

## 2023-01-13 ENCOUNTER — Ambulatory Visit: Payer: Medicaid Other | Admitting: Obstetrics & Gynecology

## 2023-02-15 ENCOUNTER — Ambulatory Visit: Payer: MEDICAID | Admitting: Orthopaedic Surgery

## 2023-02-16 ENCOUNTER — Ambulatory Visit: Payer: Medicaid Other | Admitting: Nurse Practitioner

## 2023-02-16 ENCOUNTER — Encounter: Payer: Self-pay | Admitting: Nurse Practitioner

## 2023-03-02 ENCOUNTER — Ambulatory Visit: Payer: MEDICAID | Admitting: Orthopaedic Surgery

## 2023-03-03 ENCOUNTER — Ambulatory Visit: Payer: Medicaid Other | Admitting: Obstetrics & Gynecology

## 2023-03-07 ENCOUNTER — Ambulatory Visit: Payer: MEDICAID | Admitting: Family Medicine

## 2023-03-11 ENCOUNTER — Ambulatory Visit: Payer: MEDICAID | Admitting: Family Medicine

## 2023-03-14 ENCOUNTER — Other Ambulatory Visit: Payer: Self-pay

## 2023-03-14 ENCOUNTER — Encounter (HOSPITAL_COMMUNITY): Payer: Self-pay

## 2023-03-14 ENCOUNTER — Emergency Department (HOSPITAL_COMMUNITY): Payer: MEDICAID

## 2023-03-14 ENCOUNTER — Emergency Department (HOSPITAL_COMMUNITY)
Admission: EM | Admit: 2023-03-14 | Discharge: 2023-03-14 | Disposition: A | Discharge status: Home / Self Care | Payer: MEDICAID | Attending: Emergency Medicine | Admitting: Emergency Medicine

## 2023-03-14 DIAGNOSIS — J45909 Unspecified asthma, uncomplicated: Secondary | ICD-10-CM | POA: Insufficient documentation

## 2023-03-14 DIAGNOSIS — Z8616 Personal history of COVID-19: Secondary | ICD-10-CM | POA: Insufficient documentation

## 2023-03-14 DIAGNOSIS — Z720 Tobacco use: Secondary | ICD-10-CM | POA: Insufficient documentation

## 2023-03-14 DIAGNOSIS — R0781 Pleurodynia: Secondary | ICD-10-CM | POA: Insufficient documentation

## 2023-03-14 DIAGNOSIS — R051 Acute cough: Secondary | ICD-10-CM | POA: Insufficient documentation

## 2023-03-14 DIAGNOSIS — R42 Dizziness and giddiness: Secondary | ICD-10-CM | POA: Diagnosis present

## 2023-03-14 DIAGNOSIS — Z1152 Encounter for screening for COVID-19: Secondary | ICD-10-CM | POA: Insufficient documentation

## 2023-03-14 DIAGNOSIS — Z7951 Long term (current) use of inhaled steroids: Secondary | ICD-10-CM | POA: Insufficient documentation

## 2023-03-14 LAB — CBC
HCT: 38.5 % (ref 36.0–46.0)
Hemoglobin: 13 g/dL (ref 12.0–15.0)
MCH: 34.1 pg — ABNORMAL HIGH (ref 26.0–34.0)
MCHC: 33.8 g/dL (ref 30.0–36.0)
MCV: 101 fL — ABNORMAL HIGH (ref 80.0–100.0)
Platelets: 194 10*3/uL (ref 150–400)
RBC: 3.81 MIL/uL — ABNORMAL LOW (ref 3.87–5.11)
RDW: 13 % (ref 11.5–15.5)
WBC: 8.4 10*3/uL (ref 4.0–10.5)
nRBC: 0 % (ref 0.0–0.2)

## 2023-03-14 LAB — BASIC METABOLIC PANEL
Anion gap: 6 (ref 5–15)
BUN: 9 mg/dL (ref 6–20)
CO2: 24 mmol/L (ref 22–32)
Calcium: 8.2 mg/dL — ABNORMAL LOW (ref 8.9–10.3)
Chloride: 102 mmol/L (ref 98–111)
Creatinine, Ser: 0.52 mg/dL (ref 0.44–1.00)
GFR, Estimated: >60 mL/min (ref >60)
Glucose, Bld: 204 mg/dL — ABNORMAL HIGH (ref 70–99)
Potassium: 3.8 mmol/L (ref 3.5–5.1)
Sodium: 132 mmol/L — ABNORMAL LOW (ref 135–145)

## 2023-03-14 LAB — RESP PANEL BY RT-PCR (RSV, FLU A&B, COVID)  RVPGX2
Influenza A by PCR: NEGATIVE
Influenza B by PCR: NEGATIVE
Resp Syncytial Virus by PCR: NEGATIVE
SARS Coronavirus 2 by RT PCR: NEGATIVE

## 2023-03-14 MED ORDER — ALBUTEROL SULFATE HFA 108 (90 BASE) MCG/ACT IN AERS
1.0000 | INHALATION_SPRAY | Freq: Once | RESPIRATORY_TRACT | Status: AC
  Administered 2023-03-14: 1 via RESPIRATORY_TRACT
  Filled 2023-03-14: qty 6.7

## 2023-03-14 MED ORDER — LACTATED RINGERS IV BOLUS
1000.0000 mL | Freq: Once | INTRAVENOUS | Status: AC
  Administered 2023-03-14: 1000 mL via INTRAVENOUS

## 2023-03-14 MED ORDER — BENZONATATE 100 MG PO CAPS: 100.0000 mg | ORAL_CAPSULE | Freq: Three times a day (TID) | ORAL | 0 refills | Status: DC

## 2023-03-14 MED ORDER — GUAIFENESIN 100 MG/5ML PO LIQD
5.0000 mL | Freq: Once | ORAL | Status: AC
  Administered 2023-03-14: 5 mL via ORAL
  Filled 2023-03-14: qty 5

## 2023-03-14 NOTE — ED Provider Notes (Signed)
[place] EMERGENCY DEPARTMENT AT [facility] Provider Note   CSN: [id number] Arrival date & time: 03/14/23  0347     History {Add pertinent medical, surgical, social history, OB history to HPI:1} Chief Complaint  Patient presents with   Cough   Dizziness   left side/rib pain    Susan [name] is a 47 y.o. female.  47 year old female with a history of tobacco use, crack use, and alcohol use who presents emergency department with cough.  Patient reports that over the past 2 weeks she has had a dry cough.  Says that when she is coughing she has rib pain on her left side.  No fevers or chills.  Is having some congestion.  Says that her voice is hoarse and dry.  Started having some mild dizziness today after coughing so decided to come into the emergency department for evaluation.  No known sick contacts.  Still smokes crack and cigarettes.  Does have reported history of asthma but no inhaler.  Denies any hormone use, history of DVT or PE, hemoptysis, lower extremity swelling or pain, surgery in the past month, or history of cancer.  No trauma to her ribs.       Home Medications Prior to Admission medications   Medication Sig Start Date End Date Taking? Authorizing Provider  albuterol (VENTOLIN HFA) 108 (90 Base) MCG/ACT inhaler Inhale 2 puffs into the lungs every 6 (six) hours as needed for wheezing or shortness of breath. 11/30/22   Tommie Sams, DO  buPROPion (WELLBUTRIN SR) 150 MG 12 hr tablet Take 1 tablet (150 mg total) by mouth 2 (two) times daily. 11/30/22   Tommie Sams, DO  hydrOXYzine (VISTARIL) 25 MG capsule Take 1 capsule (25 mg total) by mouth every 8 (eight) hours as needed for anxiety. 11/30/22   Tommie Sams, DO  sulindac (CLINORIL) 200 MG tablet Take 1 tablet (200 mg total) by mouth 2 (two) times daily. 11/30/22   Tommie Sams, DO  valACYclovir (VALTREX) 500 MG tablet Take 1 tablet (500 mg total) by mouth daily. For suppressive therapy. Start after initial  course of Valtrex. 11/30/22   Tommie Sams, DO      Allergies    Patient has no known allergies.    Review of Systems   Review of Systems  Physical Exam Updated Vital Signs Temp 98.8 F (37.1 C) (Oral)   Ht 5\' 1"  (1.549 m)   Wt 63 kg   BMI 26.24 kg/m  Physical Exam Vitals and nursing note reviewed.  Constitutional:      General: She is not in acute distress.    Appearance: She is well-developed.  HENT:     Head: Normocephalic and atraumatic.     Right Ear: External ear normal.     Left Ear: External ear normal.     Nose: Nose normal.  Eyes:     Extraocular Movements: Extraocular movements intact.     Conjunctiva/sclera: Conjunctivae normal.     Pupils: Pupils are equal, round, and reactive to light.  Cardiovascular:     Rate and Rhythm: Normal rate and regular rhythm.     Heart sounds: No murmur heard. Pulmonary:     Effort: Pulmonary effort is normal. No respiratory distress.     Breath sounds: Normal breath sounds.     Comments: Left rib pain reproducible Musculoskeletal:     Cervical back: Normal range of motion and neck supple.     Right lower leg: No  edema.     Left lower leg: No edema.  Skin:    General: Skin is warm and dry.  Neurological:     Mental Status: She is alert and oriented to person, place, and time. Mental status is at baseline.  Psychiatric:        Mood and Affect: Mood normal.     ED Results / Procedures / Treatments   Labs (all labs ordered are listed, but only abnormal results are displayed) Labs Reviewed - No data to display  EKG None  Radiology No results found.  Procedures Procedures  {Document cardiac monitor, telemetry assessment procedure when appropriate:1}  Medications Ordered in ED Medications - No data to display  ED Course/ Medical Decision Making/ A&P   {   Click here for ABCD2, HEART and other calculatorsREFRESH Note before signing :1}                              Medical Decision Making Amount and/or  Complexity of Data Reviewed Radiology: ordered.  Risk Prescription drug management.   ***  {Document critical care time when appropriate:1} {Document review of labs and clinical decision tools ie heart score, Chads2Vasc2 etc:1}  {Document your independent review of radiology images, and any outside records:1} {Document your discussion with family members, caretakers, and with consultants:1} {Document social determinants of health affecting pt's care:1} {Document your decision making why or why not admission, treatments were needed:1} Final Clinical Impression(s) / ED Diagnoses Final diagnoses:  None    Rx / DC Orders ED Discharge Orders     None

## 2023-03-14 NOTE — Discharge Instructions (Signed)
You were seen for your cough and dizziness in the emergency department.   At home, please stay well-hydrated.  Take tea with honey for your cough.  You may also use the Tessalon Perles for your cough as well.    Check your MyChart online for the results of any tests that had not resulted by the time you left the emergency department.   Follow-up with your primary doctor in 2-3 days regarding your visit.    Return immediately to the emergency department if you experience any of the following: Difficulty breathing, fainting, or any other concerning symptoms.    Thank you for visiting our Emergency Department. It was a pleasure taking care of you today.

## 2023-03-14 NOTE — ED Triage Notes (Signed)
For "2 weeks" pt has had a cough with left sided pain in rib area. Pt has had "dizzy" spells where she feels like she will pass out. Pt states her voice has changed but denies any throat pain at this time.

## 2023-03-15 ENCOUNTER — Telehealth: Payer: Self-pay

## 2023-03-15 NOTE — Transitions of Care (Post Inpatient/ED Visit) (Unsigned)
   03/15/2023  Name: Susan [name] MRN: [id number] DOB: Jan 29, 1976  Today's TOC FU Call Status: Today's TOC FU Call Status:: Unsuccessful Call (1st Attempt) Unsuccessful Call (1st Attempt) Date: 03/15/23  Attempted to reach the patient regarding the most recent Inpatient/ED visit.  Follow Up Plan: Additional outreach attempts will be made to reach the patient to complete the Transitions of Care (Post Inpatient/ED visit) call.   Signature Karena Addison, LPN [facility] Nurse Health Advisor Direct Dial [phone]

## 2023-03-16 NOTE — Transitions of Care (Post Inpatient/ED Visit) (Signed)
   03/16/2023  Name: Susan [name] MRN: [id number] DOB: [birth year]  Today's TOC FU Call Status: Today's TOC FU Call Status:: Unsuccessful Call (2nd Attempt) Unsuccessful Call (1st Attempt) Date: 03/15/23 Unsuccessful Call (2nd Attempt) Date: 03/16/23  Attempted to reach the patient regarding the most recent Inpatient/ED visit.  Follow Up Plan: Additional outreach attempts will be made to reach the patient to complete the Transitions of Care (Post Inpatient/ED visit) call.   Signature  TB,CMA

## 2023-03-17 ENCOUNTER — Telehealth: Payer: Self-pay

## 2023-03-17 NOTE — Transitions of Care (Post Inpatient/ED Visit) (Signed)
   03/17/2023  Name: CUCA MOREFIELD MRN: [id number] DOB: August 02, 1976  Today's TOC FU Call Status: Today's TOC FU Call Status:: Unsuccessful Call (3rd Attempt) Unsuccessful Call (3rd Attempt) Date: 03/17/23  Attempted to reach the patient regarding the most recent Inpatient/ED visit.  Follow Up Plan: No further outreach attempts will be made at this time. We have been unable to contact the patient.  Signature  tb,cma

## 2023-03-18 ENCOUNTER — Ambulatory Visit
Admission: EM | Admit: 2023-03-18 | Discharge: 2023-03-18 | Disposition: A | Discharge status: Home / Self Care | Payer: MEDICAID | Attending: Family Medicine | Admitting: Family Medicine

## 2023-03-18 DIAGNOSIS — R051 Acute cough: Secondary | ICD-10-CM | POA: Diagnosis not present

## 2023-03-18 DIAGNOSIS — N760 Acute vaginitis: Secondary | ICD-10-CM | POA: Diagnosis not present

## 2023-03-18 DIAGNOSIS — N390 Urinary tract infection, site not specified: Secondary | ICD-10-CM | POA: Insufficient documentation

## 2023-03-18 LAB — POCT URINALYSIS DIP (MANUAL ENTRY)
Bilirubin, UA: NEGATIVE
Glucose, UA: NEGATIVE mg/dL
Ketones, POC UA: NEGATIVE mg/dL
Nitrite, UA: NEGATIVE
Protein Ur, POC: NEGATIVE mg/dL
Spec Grav, UA: 1.025 (ref 1.010–1.025)
Urobilinogen, UA: 0.2 E.U./dL
pH, UA: 7 (ref 5.0–8.0)

## 2023-03-18 MED ORDER — FLUTICASONE PROPIONATE 50 MCG/ACT NA SUSP: 1.0000 | Freq: Two times a day (BID) | NASAL | 2 refills | Status: DC

## 2023-03-18 MED ORDER — CEPHALEXIN 500 MG PO CAPS: 500.0000 mg | ORAL_CAPSULE | Freq: Two times a day (BID) | ORAL | 0 refills | Status: DC

## 2023-03-18 NOTE — ED Triage Notes (Signed)
Pt has vaginal odor, white discharge, and lower abdominal pressure x 3 weeks     Pt has had new unprotected sexual partners x 1 week.
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
## 2023-03-21 ENCOUNTER — Telehealth (HOSPITAL_COMMUNITY): Payer: Self-pay

## 2023-03-21 MED ORDER — METRONIDAZOLE 500 MG PO TABS: 500.0000 mg | ORAL_TABLET | Freq: Two times a day (BID) | ORAL | 0 refills | Status: DC

## 2023-03-23 ENCOUNTER — Ambulatory Visit: Payer: MEDICAID | Admitting: Family Medicine

## 2023-03-30 ENCOUNTER — Ambulatory Visit: Payer: MEDICAID | Admitting: Family Medicine

## 2023-04-05 ENCOUNTER — Ambulatory Visit: Payer: MEDICAID | Admitting: Family Medicine

## 2023-04-06 ENCOUNTER — Ambulatory Visit: Payer: MEDICAID | Admitting: Family Medicine

## 2023-04-14 ENCOUNTER — Encounter: Payer: Self-pay | Admitting: Family Medicine

## 2023-04-14 ENCOUNTER — Ambulatory Visit: Payer: MEDICAID | Admitting: Family Medicine

## 2023-06-01 ENCOUNTER — Ambulatory Visit: Payer: Medicaid Other | Admitting: Family Medicine

## 2023-06-17 ENCOUNTER — Telehealth: Payer: Self-pay

## 2023-06-17 NOTE — Telephone Encounter (Signed)
Copied from CRM [id number]. Topic: General - Registration Update >> Jun 17, 2023  1:42 PM Herbert Seta B wrote: Patient/patient representative is calling to make an update to registration.  PATIENT WOULD LIKE TO BE ACCEPTED BACK INTO PRACTICE AS PATIENT. STATED HAD NO INSURANCE OR TRANSPORTATION PREVIOUSLY BUT DOES NOW AND CAN MAKE IT TIMELY. PLEASE CALL MOBILE [phone]
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
## 2023-06-20 NOTE — Telephone Encounter (Signed)
Called patient left voicemail to contact our office. 

## 2023-07-09 ENCOUNTER — Ambulatory Visit: Payer: MEDICAID

## 2023-07-20 ENCOUNTER — Ambulatory Visit (INDEPENDENT_AMBULATORY_CARE_PROVIDER_SITE_OTHER): Payer: MEDICAID | Admitting: Orthopaedic Surgery

## 2023-07-20 ENCOUNTER — Encounter: Payer: Self-pay | Admitting: Orthopaedic Surgery

## 2023-07-20 VITALS — BP 131/79 | HR 81 | Ht 61.0 in | Wt 134.0 lb

## 2023-07-20 DIAGNOSIS — M7711 Lateral epicondylitis, right elbow: Secondary | ICD-10-CM

## 2023-07-20 MED ORDER — HYDROCODONE-ACETAMINOPHEN 5-325 MG PO TABS: ORAL_TABLET | ORAL | 0 refills | Status: DC

## 2023-07-20 NOTE — Progress Notes (Signed)
My elbow hurts.  She has lateral elbow pain on the right dominant arm.  She has tried ice, heat, Tylenol and naprosyn with little help.  It hurts to lift something.  She has no trauma, no redness.  She is tried of hurting.  Right elbow is tender over the lateral epicondyle. She has no redness, no swelling.  She has pain to resisted dorsiflexion of the wrist.  NV intact.  Encounter Diagnosis  Name Primary?   Lateral epicondylitis, right elbow Yes   I have explained ice massage.  She is to do this three times a day.  Continue the Naprosyn.  I will call in pain medicine.  I have reviewed the [place] Controlled Substance Reporting System web site prior to prescribing narcotic medicine for this patient.  Return in six weeks.  I have told her this takes time to resolve.  Call if any problem.  Precautions discussed.  Electronically Signed Darreld Mclean, MD 12/11/202410:07 AM

## 2023-07-27 DIAGNOSIS — F14929 Cocaine use, unspecified with intoxication, unspecified: Secondary | ICD-10-CM | POA: Insufficient documentation

## 2023-07-28 ENCOUNTER — Telehealth: Payer: Self-pay

## 2023-07-28 MED ORDER — HYDROCODONE-ACETAMINOPHEN 5-325 MG PO TABS: ORAL_TABLET | ORAL | 0 refills | Status: DC

## 2023-07-28 NOTE — Telephone Encounter (Signed)
Hydrocodone-Acetaminophen 5/325 MG Qty 30 Tablets  PATIENT USES [facility]

## 2023-08-31 ENCOUNTER — Encounter: Payer: Self-pay | Admitting: Orthopaedic Surgery

## 2023-08-31 ENCOUNTER — Ambulatory Visit: Payer: MEDICAID | Admitting: Orthopaedic Surgery

## 2023-08-31 VITALS — BP 120/66 | HR 76

## 2023-08-31 DIAGNOSIS — M7711 Lateral epicondylitis, right elbow: Secondary | ICD-10-CM | POA: Diagnosis not present

## 2023-08-31 MED ORDER — HYDROCODONE-ACETAMINOPHEN 5-325 MG PO TABS: ORAL_TABLET | ORAL | 0 refills | Status: DC

## 2023-08-31 NOTE — Progress Notes (Signed)
My elbow still hurts some.  She chronic right lateral epicondylitis.  She has pain still but not as severe.  She is doing the ice massage. She is not taking her Naprosyn regular.  She has pain to resisted dorsal extension of the wrist on the right. She has tenderness over the right lateral epicondyle.  NV intact. ROM of neck is full.  Encounter Diagnosis  Name Primary?   Lateral epicondylitis, right elbow Yes   Continue the ice massage.  Resume the Naprosyn.  I have reviewed the [place] Controlled Substance Reporting System web site prior to prescribing narcotic medicine for this patient.  Return in six week.s  Call if any problem.  Precautions discussed.  Electronically Signed Darreld Mclean, MD 1/22/20259:01 AM

## 2023-10-12 ENCOUNTER — Ambulatory Visit: Payer: MEDICAID | Admitting: Orthopaedic Surgery

## 2023-11-15 ENCOUNTER — Encounter: Payer: MEDICAID | Admitting: Orthopedic Surgery

## 2023-11-17 ENCOUNTER — Other Ambulatory Visit: Payer: Self-pay

## 2023-11-17 ENCOUNTER — Emergency Department (HOSPITAL_COMMUNITY)
Admission: EM | Admit: 2023-11-17 | Discharge: 2023-11-17 | Disposition: A | Discharge status: Home / Self Care | Payer: MEDICAID | Attending: Emergency Medicine | Admitting: Emergency Medicine

## 2023-11-17 ENCOUNTER — Encounter (HOSPITAL_COMMUNITY): Payer: Self-pay

## 2023-11-17 ENCOUNTER — Emergency Department (HOSPITAL_COMMUNITY): Payer: MEDICAID

## 2023-11-17 DIAGNOSIS — M25512 Pain in left shoulder: Secondary | ICD-10-CM | POA: Insufficient documentation

## 2023-11-17 DIAGNOSIS — W01198A Fall on same level from slipping, tripping and stumbling with subsequent striking against other object, initial encounter: Secondary | ICD-10-CM | POA: Insufficient documentation

## 2023-11-17 DIAGNOSIS — W19XXXA Unspecified fall, initial encounter: Secondary | ICD-10-CM

## 2023-11-17 MED ORDER — KETOROLAC TROMETHAMINE 15 MG/ML IJ SOLN
15.0000 mg | Freq: Once | INTRAMUSCULAR | Status: AC
  Administered 2023-11-17: 15 mg via INTRAMUSCULAR
  Filled 2023-11-17: qty 1

## 2023-11-17 NOTE — ED Provider Notes (Signed)
 [name] Provider Note   CSN: [id number] Arrival date & time: 11/17/23  2841     History Chief Complaint  Patient presents with   [name] on cement one week ago, hit front of left shoulder    [name] is a 48 y.o. female patient who presents to the emergency department today for further evaluation of left shoulder pain that been ongoing for a week.  Patient states she had a mechanical trip and fall and fell forward hitting her left shoulder on the concrete. She did not hit her head or lose consciousness. She is not anticoagulated.    Fall       Home Medications Prior to Admission medications   Medication Sig Start Date End Date Taking? Authorizing Provider  HYDROcodone-acetaminophen (NORCO/VICODIN) 5-325 MG tablet One tablet every six hours for pain.  Limit 7 days. 08/31/23   Susan Mclean, MD  naproxen (EC NAPROSYN) 500 MG EC tablet Take 500 mg by mouth 2 (two) times daily with a meal.    [provider]      Allergies    Patient has no known allergies.    Review of Systems   Review of Systems  All other systems reviewed and are negative.   Physical Exam Updated Vital Signs BP 109/61   Pulse 78   Temp 98.1 F (36.7 C) (Oral)   Resp 16   Ht 5\' 1"  (1.549 m)   Wt 70.3 kg   SpO2 98%   BMI 29.29 kg/m  Physical Exam Vitals and nursing note reviewed.  Constitutional:      Appearance: Normal appearance.  HENT:     Head: Normocephalic and atraumatic.  Eyes:     General:        Right eye: No discharge.        Left eye: No discharge.     Conjunctiva/sclera: Conjunctivae normal.  Pulmonary:     Effort: Pulmonary effort is normal.  Musculoskeletal:     Comments: There is tenderness to palpation over the left shoulder.  Limited range of motion secondary to pain.  Strong 2+ radial pulse felt in the left wrist.  Skin:    General: Skin is warm and dry.     Findings: No rash.  Neurological:      General: No focal deficit present.     Mental Status: She is alert.  Psychiatric:        Mood and Affect: Mood normal.        Behavior: Behavior normal.     ED Results / Procedures / Treatments   Labs (all labs ordered are listed, but only abnormal results are displayed) Labs Reviewed - No data to display  EKG None  Radiology DG Shoulder Left Portable Result Date: 11/17/2023 CLINICAL DATA:  Left shoulder pain after fall. EXAM: LEFT SHOULDER COMPARISON:  None Available. FINDINGS: There is no evidence of fracture or dislocation. There is no evidence of arthropathy or other focal bone abnormality. Soft tissues are unremarkable. IMPRESSION: Negative. Electronically Signed   By: [name] M.D.   On: 11/17/2023 11:31    Procedures Procedures   Medications Ordered in ED Medications - No data to display  ED Course/ Medical Decision Making/ A&P   {   Click here for ABCD2, HEART and other calculators  Medical Decision Making [name] is a 48 y.o. female patient who presents to the emergency department today for further evaluation  of left shoulder pain.  Will give the patient sling for comfort.  Imaging was ordered by myself inter by myself.  This was normal.  This is likely just a sprain or possible ligamentous injury that is not being picked up on imaging.  Patient does have full range of motion apart from abduction but this reproduces her pain.  Will have her follow-up with orthopedics.  She is safe for discharge at this time.  Strict turn precautions were discussed.  Will treat conservatively with NSAIDs.   Amount and/or Complexity of Data Reviewed Radiology: ordered.    Final Clinical Impression(s) / ED Diagnoses Final diagnoses:  Fall, initial encounter  Acute pain of left shoulder    Rx / DC Orders ED Discharge Orders     None         [name] 11/17/23 1206    Susan Files, MD 11/17/23 [name]

## 2023-11-17 NOTE — ED Triage Notes (Signed)
 Patient s/p fall on cement. C/O anterior left shoulder pain. Limited movement at shoulder joint. 8/10 pain.

## 2023-11-17 NOTE — Discharge Instructions (Addendum)
 Your shoulder x-ray was normal which is great news. Please take 600 mg of ibuprofen every 6 hours as needed for pain.  Have given you follow-up with an orthopedic doctor.  Please call to schedule an appointment.  You can use the sling for comfort.  You may return to the emergency department for any worsening symptoms.

## 2023-11-23 ENCOUNTER — Encounter: Payer: MEDICAID | Admitting: Orthopaedic Surgery

## 2023-12-28 ENCOUNTER — Other Ambulatory Visit: Payer: Self-pay | Admitting: *Deleted

## 2023-12-28 ENCOUNTER — Ambulatory Visit: Payer: MEDICAID | Admitting: Orthopaedic Surgery

## 2023-12-28 ENCOUNTER — Encounter: Payer: Self-pay | Admitting: Orthopaedic Surgery

## 2023-12-28 VITALS — BP 125/83 | HR 75 | Ht 61.0 in | Wt 155.0 lb

## 2023-12-28 DIAGNOSIS — G8929 Other chronic pain: Secondary | ICD-10-CM | POA: Diagnosis not present

## 2023-12-28 DIAGNOSIS — F17210 Nicotine dependence, cigarettes, uncomplicated: Secondary | ICD-10-CM

## 2023-12-28 DIAGNOSIS — M25512 Pain in left shoulder: Secondary | ICD-10-CM

## 2023-12-28 MED ORDER — METHYLPREDNISOLONE ACETATE 40 MG/ML IJ SUSP
40.0000 mg | Freq: Once | INTRAMUSCULAR | Status: AC
Start: 2023-12-28 — End: 2023-12-28
  Administered 2023-12-28: 40 mg via INTRA_ARTICULAR

## 2023-12-28 NOTE — Patient Instructions (Signed)
 Steps to Quit Smoking Smoking tobacco is the leading cause of preventable death. It can affect almost every organ in the body. Smoking puts you and people around you at risk for many serious, long-lasting (chronic) diseases. Quitting smoking can be hard, but it is one of the best things that you can do for your health. It is never too late to quit. Do not give up if you cannot quit the first time. Some people need to try many times to quit. Do your best to stick to your quit plan, and talk with your doctor if you have any questions or concerns. How do I get ready to quit? Pick a date to quit. Set a date within the next 2 weeks to give you time to prepare. Write down the reasons why you are quitting. Keep this list in places where you will see it often. Tell your family, friends, and co-workers that you are quitting. Their support is important. Talk with your doctor about the choices that may help you quit. Find out if your health insurance will pay for these treatments. Know the people, places, things, and activities that make you want to smoke (triggers). Avoid them. What first steps can I take to quit smoking? Throw away all cigarettes at home, at work, and in your car. Throw away the things that you use when you smoke, such as ashtrays and lighters. Clean your car. Empty the ashtray. Clean your home, including curtains and carpets. What can I do to help me quit smoking? Talk with your doctor about taking medicines and seeing a counselor. You are more likely to succeed when you do both. If you are pregnant or breastfeeding: Talk with your doctor about counseling or other ways to quit smoking. Do not take medicine to help you quit smoking unless your doctor tells you to. Quit right away Quit smoking completely, instead of slowly cutting back on how much you smoke over a period of time. Stopping smoking right away may be more successful than slowly quitting. Go to counseling. In-person is best  if this is an option. You are more likely to quit if you go to counseling sessions regularly. Take medicine You may take medicines to help you quit. Some medicines need a prescription, and some you can buy over-the-counter. Some medicines may contain a drug called nicotine to replace the nicotine in cigarettes. Medicines may: Help you stop having the desire to smoke (cravings). Help to stop the problems that come when you stop smoking (withdrawal symptoms). Your doctor may ask you to use: Nicotine patches, gum, or lozenges. Nicotine inhalers or sprays. Non-nicotine medicine that you take by mouth. Find resources Find resources and other ways to help you quit smoking and remain smoke-free after you quit. They include: Online chats with a Veterinary surgeon. Phone quitlines. Printed Materials engineer. Support groups or group counseling. Text messaging programs. Mobile phone apps. Use apps on your mobile phone or tablet that can help you stick to your quit plan. Examples of free services include Quit Guide from the CDC and smokefree.gov  What can I do to make it easier to quit?  Talk to your family and friends. Ask them to support and encourage you. Call a phone quitline, such as 1-800-QUIT-NOW, reach out to support groups, or work with a Veterinary surgeon. Ask people who smoke to not smoke around you. Avoid places that make you want to smoke, such as: Bars. Parties. Smoke-break areas at work. Spend time with people who do not smoke. Lower  the stress in your life. Stress can make you want to smoke. Try these things to lower stress: Getting regular exercise. Doing deep-breathing exercises. Doing yoga. Meditating. What benefits will I see if I quit smoking? Over time, you may have: A better sense of smell and taste. Less coughing and sore throat. A slower heart rate. Lower blood pressure. Clearer skin. Better breathing. Fewer sick days. Summary Quitting smoking can be hard, but it is one of  the best things that you can do for your health. Do not give up if you cannot quit the first time. Some people need to try many times to quit. When you decide to quit smoking, make a plan to help you succeed. Quit smoking right away, not slowly over a period of time. When you start quitting, get help and support to keep you smoke-free. This information is not intended to replace advice given to you by your health care provider. Make sure you discuss any questions you have with your health care provider. Document Revised: 07/17/2021 Document Reviewed: 07/17/2021 Elsevier Patient Education  2024 [employer].

## 2023-12-28 NOTE — Progress Notes (Signed)
 My shoulder hurts.  She hurt her left shoulder in a fall and was seen in the ER on 11-17-23.  X-rays were done and were negative. She still hurts.  She has pain with overhead use and laying on  the left shoulder at night.  She has no numbness, no redness, no swelling. She has tried ice, heat, rest, exercise, Advil  with no help.  Examination of left Upper Extremity is done.  Inspection:   Overall:  Elbow non-tender without crepitus or defects, forearm non-tender without crepitus or defects, wrist non-tender without crepitus or defects, hand non-tender.    Shoulder: with glenohumeral joint tenderness, without effusion.   Upper arm:  without swelling and tenderness   Range of motion:   Overall:  Full range of motion of the elbow, full range of motion of wrist and full range of motion in fingers.   Shoulder:  left  110 degrees forward flexion; 75 degrees abduction; 15 degrees internal rotation, 15 degrees external rotation, 5 degrees extension, 40 degrees adduction.   Stability:   Overall:  Shoulder, elbow and wrist stable   Strength and Tone:   Overall full shoulder muscles strength, full upper arm strength and normal upper arm bulk and tone.  She has positive drop sign.  I have independently reviewed and interpreted x-rays of this patient done at another site by another physician or qualified health professional.  Encounter Diagnoses  Name Primary?   Chronic left shoulder pain Yes   Nicotine  dependence, cigarettes, uncomplicated     PROCEDURE NOTE:  The patient request injection, verbal consent was obtained.  The left shoulder was prepped appropriately after time out was performed.   Sterile technique was observed and injection of 1 cc of DepoMedrol 40mg  with several cc's of plain xylocaine . Anesthesia was provided by ethyl chloride and a 20-gauge needle was used to inject the shoulder area. A posterior approach was used.  The injection was tolerated well.  A band aid dressing was  applied.  The patient was advised to apply ice later today and tomorrow to the injection sight as needed.  I would like to get a MRI as she has not improved with conservative therapy.  Return in two weeks.  Call if any problem.  Precautions discussed.  Electronically Signed Pleasant Brilliant, MD 5/21/20251:37 PM

## 2023-12-30 ENCOUNTER — Ambulatory Visit (HOSPITAL_COMMUNITY)
Admission: RE | Admit: 2023-12-30 | Discharge: 2023-12-30 | Disposition: A | Discharge status: Home / Self Care | Payer: MEDICAID | Source: Ambulatory Visit | Attending: Orthopaedic Surgery | Admitting: Orthopaedic Surgery

## 2023-12-30 DIAGNOSIS — M25512 Pain in left shoulder: Secondary | ICD-10-CM | POA: Diagnosis present

## 2023-12-30 DIAGNOSIS — G8929 Other chronic pain: Secondary | ICD-10-CM | POA: Diagnosis present

## 2024-01-11 ENCOUNTER — Ambulatory Visit: Payer: MEDICAID | Admitting: Orthopaedic Surgery

## 2024-01-11 ENCOUNTER — Telehealth: Payer: Self-pay

## 2024-01-11 NOTE — Telephone Encounter (Signed)
 Patient is asking if you would do script for   Hydrocodone -Acetaminophen  5/325 MG Qty 28 Tablets  7 day limit  PATIENT USES [facility]

## 2024-01-12 ENCOUNTER — Telehealth: Payer: Self-pay | Admitting: Orthopaedic Surgery

## 2024-01-12 MED ORDER — HYDROCODONE-ACETAMINOPHEN 5-325 MG PO TABS: ORAL_TABLET | ORAL | 0 refills | Status: DC

## 2024-01-12 NOTE — Telephone Encounter (Signed)
 Dr. Vicente Graham pt - pt lvm stating she can't hardly lift her shoulder and she's in excruciating pain.  She requesting something for pain to be sent in.

## 2024-01-18 ENCOUNTER — Encounter: Payer: Self-pay | Admitting: Orthopaedic Surgery

## 2024-01-18 ENCOUNTER — Ambulatory Visit: Payer: MEDICAID | Admitting: Orthopaedic Surgery

## 2024-01-18 DIAGNOSIS — M25512 Pain in left shoulder: Secondary | ICD-10-CM

## 2024-01-18 DIAGNOSIS — G8929 Other chronic pain: Secondary | ICD-10-CM | POA: Diagnosis not present

## 2024-01-18 MED ORDER — HYDROCODONE-ACETAMINOPHEN 5-325 MG PO TABS: ORAL_TABLET | ORAL | 0 refills | Status: DC

## 2024-01-18 NOTE — Progress Notes (Signed)
 My shoulder hurts.  She had MRI of the left shoulder showing: IMPRESSION: 1. Moderate anterior infraspinatus tendinosis with possible tiny 3 mm or smaller partial-thickness tear of the deep inserting tendon footprint fibers. No tendon retraction. 2. Mild supraspinatus tendinosis. 3. Mild degenerative changes of the acromioclavicular joint. Type III acromion with approximately 5 mm downsloping of the anterolateral acromion. 4. Moderate atrophy and mild feathery fatty infiltration of the teres minor muscle. 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 No space-occupying lesion is seen within the quadrilateral space on the current MRI. 5. Mild-to-moderate glenohumeral cartilage thinning.  I have explained the findings to her.  I will begin PT for the shoulder.  I have independently reviewed the MRI.    ROM of the left shoulder is limited secondary to pain.  NV intact.  Grips normal.  ROM neck is full.  Encounter Diagnosis  Name Primary?   Chronic left shoulder pain Yes   Begin OT.  I have reviewed the [place]  Controlled Substance Reporting System web site prior to prescribing narcotic medicine for this patient.  Return in three weeks.  Call if any problem.  Precautions discussed.  I will give sling.  Electronically Signed Pleasant Brilliant, MD 6/11/202510:52 AM

## 2024-01-31 ENCOUNTER — Ambulatory Visit (HOSPITAL_COMMUNITY): Payer: MEDICAID | Attending: Occupational Therapy | Admitting: Occupational Therapy

## 2024-02-01 ENCOUNTER — Telehealth (HOSPITAL_COMMUNITY): Payer: Self-pay | Admitting: Occupational Therapy

## 2024-02-01 NOTE — Telephone Encounter (Signed)
 This OT called pt x2, regarding No show on 01/31/24 at 2:45pm. No answer, unable to leave VM. Front office notified.

## 2024-02-06 ENCOUNTER — Other Ambulatory Visit: Payer: Self-pay

## 2024-02-06 ENCOUNTER — Emergency Department (HOSPITAL_COMMUNITY)
Admission: EM | Admit: 2024-02-06 | Discharge: 2024-02-06 | Disposition: A | Discharge status: Home / Self Care | Payer: MEDICAID | Attending: Emergency Medicine | Admitting: Emergency Medicine

## 2024-02-06 ENCOUNTER — Encounter (HOSPITAL_COMMUNITY): Payer: Self-pay

## 2024-02-06 DIAGNOSIS — G8929 Other chronic pain: Secondary | ICD-10-CM | POA: Diagnosis not present

## 2024-02-06 DIAGNOSIS — M25512 Pain in left shoulder: Secondary | ICD-10-CM | POA: Insufficient documentation

## 2024-02-06 MED ORDER — METHYLPREDNISOLONE SODIUM SUCC 40 MG IJ SOLR
40.0000 mg | Freq: Once | INTRAMUSCULAR | Status: AC
  Administered 2024-02-06: 40 mg via INTRAMUSCULAR
  Filled 2024-02-06: qty 1

## 2024-02-06 MED ORDER — HYDROCODONE-ACETAMINOPHEN 5-325 MG PO TABS
ORAL_TABLET | ORAL | 0 refills | Status: DC
Start: 2024-02-06 — End: 2024-03-23

## 2024-02-06 MED ORDER — METHYLPREDNISOLONE 4 MG PO TBPK: ORAL_TABLET | ORAL | 0 refills | Status: DC

## 2024-02-06 NOTE — Discharge Instructions (Signed)
 You were seen for your shoulder pain in the emergency department.  Take Tylenol  and ibuprofen  for your pain.  Take the Medrol  Dosepak we have prescribed you for your pain. You may also take the norco we have prescribed you for any breakthrough pain that may have her pain with physical therapy.  Do not take this before driving or operating heavy machinery.  Do not take this medication with alcohol.  Check your MyChart online for the results of any tests that had not resulted by the time you left the emergency department.   Follow-up with your orthopedic doctor in 2-3 days regarding your visit.    Return immediately to the emergency department if you experience any of the following: Worsening pain, or any other concerning symptoms.    Thank you for visiting our Emergency Department. It was a pleasure taking care of you today.

## 2024-02-06 NOTE — ED Triage Notes (Signed)
 Pt arrived via POV c/o chronic left shoulder pain. Pt reports having a PT appointment tomorrow but she has run out of her pain medication. Pt requesting a refill.

## 2024-02-06 NOTE — ED Provider Notes (Signed)
 [place] EMER[facility] DEPARTMENT AT [facility] Provider Note   CSN: [id number] Arrival date & time: 02/06/24  1351     Patient presents with: Shoulder Pain   [name] is a 48 y.o. female.   48 year old female with history of chronic left shoulder pain who presents emergency department for shoulder pain.  Patient reports that 3 months ago she started having atraumatic left shoulder pain.  Has followed up with orthopedics who has performed a steroid shot in her shoulder and to give her a short course of pain medication.  Also had an MRI that does show some degenerative changes of her shoulder.  Says that her shoulders been hurting more recently but has not had any injuries.  Also is going to work with physical therapy tomorrow and is concerned about the pain when she works with them.       Prior to Admission medications   Medication Sig Start Date End Date Taking? Authorizing Provider  methylPREDNISolone  (MEDROL  DOSEPAK) 4 MG TBPK tablet Take as directed on packaging 02/06/24  Yes Yolande Lamar BROCKS, MD  HYDROcodone -acetaminophen  (NORCO/VICODIN) 5-325 MG tablet One tablet by mouth every six hours as needed for pain.  Seven day limit 02/06/24   Yolande Lamar BROCKS, MD  naproxen  (EC NAPROSYN ) 500 MG EC tablet Take 500 mg by mouth 2 (two) times daily with a meal.    [provider]    Allergies: Patient has no known allergies.    Review of Systems  Updated Vital Signs BP (!) 143/83 (BP Location: Right Arm)   Pulse 67   Temp 98.6 F (37 C) (Oral)   Resp 16   Ht 5' 1 (1.549 m)   Wt 70.3 kg   LMP 02/06/2024 (Exact Date)   SpO2 99%   BMI 29.28 kg/m   Physical Exam  Musculoskeletal:     Comments: No significant tenderness palpation of the left shoulder.  No joint effusion noted.  Patient able to abduct the shoulder to 110 degrees.  Full range of motion in internal and external rotation of the left shoulder.  Positive empty can test on the left.  Does have  pain against resistance with internal and external rotation of the left shoulder.  Symmetrically palpable radial and ulnar pulses. Capillary refill <2 seconds to all digits.  Motor: Muscle bulk and tone are normal. Strength is 5/5 in shoulder abduction, elbow flexion and extension, grip strength of the left arm  Sensory: Intact sensation to light touch in C5-T1 dermatomes of the left arm      (all labs ordered are listed, but only abnormal results are displayed) Labs Reviewed - No data to display  EKG: None  Radiology: No results found.   Procedures   Medications Ordered in the ED  methylPREDNISolone  sodium succinate (SOLU-MEDROL ) 40 mg/mL injection 40 mg (40 mg Intramuscular Given 02/06/24 1616)                                    Medical Decision Making Risk Prescription drug management.   [name] is a 48 y.o. female with comorbidities that complicate the patient evaluation including chronic shoulder pain who presents with shoulder pain   Initial Ddx:  Chronic shoulder pain, fracture, dislocation, rotator cuff tear   MDM/Course:  48 yo F with hx of chronic shoulder pain who presents to the emergency department with persistent shoulder pain. Does have MRI that  has confirmed rotator cuff tears. No concerning findings on her exam today. Will give a short course of norco to help her get through PT the next few days but did inform the patient that she will need to fu with her outpatient team for additional refills. Given solumedrol shot and medrol  dosepack to take as well since she says that steroids have helped in the past.   This patient presents to the ED for concern of complaints listed in HPI, this involves an extensive number of treatment options, and is a complaint that carries with it a high risk of complications and morbidity. Disposition including potential need for admission considered.   Dispo: DC Home. Return precautions discussed including, but not limited  to, those listed in the AVS. Allowed pt time to ask questions which were answered fully prior to dc.  Records reviewed Outpatient Clinic Notes I have reviewed the patients home medications and made adjustments as needed  Portions of this note were generated with Dragon dictation software. Dictation errors may occur despite best attempts at proofreading.     Final diagnoses:  Acute pain of left shoulder    ED Discharge Orders          Ordered    HYDROcodone -acetaminophen  (NORCO/VICODIN) 5-325 MG tablet        02/06/24 1608    methylPREDNISolone  (MEDROL  DOSEPAK) 4 MG TBPK tablet        02/06/24 1608               Yolande Lamar BROCKS, MD 02/06/24 2320

## 2024-02-07 ENCOUNTER — Encounter (HOSPITAL_COMMUNITY): Payer: MEDICAID | Admitting: Occupational Therapy

## 2024-02-08 ENCOUNTER — Ambulatory Visit: Payer: MEDICAID | Admitting: Orthopaedic Surgery

## 2024-02-08 ENCOUNTER — Telehealth (HOSPITAL_COMMUNITY): Payer: Self-pay | Admitting: Occupational Therapy

## 2024-02-08 ENCOUNTER — Ambulatory Visit (HOSPITAL_COMMUNITY): Payer: MEDICAID | Attending: Orthopaedic Surgery | Admitting: Occupational Therapy

## 2024-02-08 NOTE — Telephone Encounter (Signed)
 This OT spoke with pt regarding her No Show on 02/08/24. Pt reported that she does not have a ride and was unable to make it in. OT requested she call back tomorrow to reschedule appointment.   Valentin Nightingale, OTR/L [facility] [phone]

## 2024-02-29 ENCOUNTER — Ambulatory Visit: Payer: MEDICAID | Admitting: Orthopaedic Surgery

## 2024-03-01 ENCOUNTER — Encounter (HOSPITAL_COMMUNITY): Payer: Self-pay | Admitting: *Deleted

## 2024-03-01 ENCOUNTER — Emergency Department (HOSPITAL_COMMUNITY)
Admission: EM | Admit: 2024-03-01 | Discharge: 2024-03-01 | Disposition: A | Discharge status: Home / Self Care | Payer: MEDICAID | Attending: Emergency Medicine | Admitting: Emergency Medicine

## 2024-03-01 ENCOUNTER — Other Ambulatory Visit: Payer: Self-pay

## 2024-03-01 ENCOUNTER — Emergency Department (HOSPITAL_COMMUNITY): Payer: MEDICAID

## 2024-03-01 DIAGNOSIS — D72829 Elevated white blood cell count, unspecified: Secondary | ICD-10-CM | POA: Diagnosis not present

## 2024-03-01 DIAGNOSIS — J029 Acute pharyngitis, unspecified: Secondary | ICD-10-CM | POA: Insufficient documentation

## 2024-03-01 LAB — CBC WITH DIFFERENTIAL/PLATELET
Abs Immature Granulocytes: 0.05 K/uL (ref 0.00–0.07)
Basophils Absolute: 0.1 K/uL (ref 0.0–0.1)
Basophils Relative: 1 %
Eosinophils Absolute: 0.2 K/uL (ref 0.0–0.5)
Eosinophils Relative: 1 %
HCT: 37.7 % (ref 36.0–46.0)
Hemoglobin: 12.7 g/dL (ref 12.0–15.0)
Immature Granulocytes: 0 %
Lymphocytes Relative: 23 %
Lymphs Abs: 3 K/uL (ref 0.7–4.0)
MCH: 34.2 pg — ABNORMAL HIGH (ref 26.0–34.0)
MCHC: 33.7 g/dL (ref 30.0–36.0)
MCV: 101.6 fL — ABNORMAL HIGH (ref 80.0–100.0)
Monocytes Absolute: 0.8 K/uL (ref 0.1–1.0)
Monocytes Relative: 6 %
Neutro Abs: 8.8 K/uL — ABNORMAL HIGH (ref 1.7–7.7)
Neutrophils Relative %: 69 %
Platelets: 293 K/uL (ref 150–400)
RBC: 3.71 MIL/uL — ABNORMAL LOW (ref 3.87–5.11)
RDW: 12.8 % (ref 11.5–15.5)
WBC: 12.8 K/uL — ABNORMAL HIGH (ref 4.0–10.5)
nRBC: 0 % (ref 0.0–0.2)

## 2024-03-01 LAB — RESP PANEL BY RT-PCR (RSV, FLU A&B, COVID)  RVPGX2
Influenza A by PCR: NEGATIVE
Influenza B by PCR: NEGATIVE
Resp Syncytial Virus by PCR: NEGATIVE
SARS Coronavirus 2 by RT PCR: NEGATIVE

## 2024-03-01 LAB — BASIC METABOLIC PANEL WITH GFR
Anion gap: 13 (ref 5–15)
BUN: 24 mg/dL — ABNORMAL HIGH (ref 6–20)
CO2: 23 mmol/L (ref 22–32)
Calcium: 8.8 mg/dL — ABNORMAL LOW (ref 8.9–10.3)
Chloride: 101 mmol/L (ref 98–111)
Creatinine, Ser: 0.85 mg/dL (ref 0.44–1.00)
GFR, Estimated: >60 mL/min (ref >60)
Glucose, Bld: 129 mg/dL — ABNORMAL HIGH (ref 70–99)
Potassium: 3.5 mmol/L (ref 3.5–5.1)
Sodium: 137 mmol/L (ref 135–145)

## 2024-03-01 LAB — HCG, SERUM, QUALITATIVE: Preg, Serum: NEGATIVE

## 2024-03-01 LAB — MONONUCLEOSIS SCREEN: Mono Screen: NEGATIVE

## 2024-03-01 LAB — GROUP A STREP BY PCR: Group A Strep by PCR: NOT DETECTED

## 2024-03-01 MED ORDER — SODIUM CHLORIDE 0.9 % IV BOLUS
1000.0000 mL | Freq: Once | INTRAVENOUS | Status: AC
  Administered 2024-03-01: 1000 mL via INTRAVENOUS

## 2024-03-01 MED ORDER — KETOROLAC TROMETHAMINE 15 MG/ML IJ SOLN
15.0000 mg | Freq: Once | INTRAMUSCULAR | Status: AC
  Administered 2024-03-01: 15 mg via INTRAVENOUS
  Filled 2024-03-01: qty 1

## 2024-03-01 MED ORDER — ONDANSETRON HCL 4 MG/2ML IJ SOLN
4.0000 mg | Freq: Once | INTRAMUSCULAR | Status: AC
  Administered 2024-03-01: 4 mg via INTRAVENOUS
  Filled 2024-03-01: qty 2

## 2024-03-01 MED ORDER — IOHEXOL 300 MG/ML  SOLN
75.0000 mL | Freq: Once | INTRAMUSCULAR | Status: AC | PRN
  Administered 2024-03-01: 75 mL via INTRAVENOUS

## 2024-03-01 MED ORDER — MAGIC MOUTHWASH W/LIDOCAINE: 5.0000 mL | Freq: Three times a day (TID) | ORAL | 0 refills | Status: DC | PRN

## 2024-03-01 MED ORDER — AMOXICILLIN 500 MG PO CAPS
500.0000 mg | ORAL_CAPSULE | Freq: Three times a day (TID) | ORAL | 0 refills | Status: DC
Start: 2024-03-01 — End: 2024-04-04

## 2024-03-01 MED ORDER — AMOXICILLIN 250 MG PO CAPS
500.0000 mg | ORAL_CAPSULE | Freq: Once | ORAL | Status: AC
  Administered 2024-03-01: 500 mg via ORAL
  Filled 2024-03-01: qty 2

## 2024-03-01 MED ORDER — PREDNISONE 20 MG PO TABS
40.0000 mg | ORAL_TABLET | Freq: Every day | ORAL | 0 refills | Status: DC
Start: 2024-03-01 — End: 2024-04-04

## 2024-03-01 MED ORDER — MORPHINE SULFATE (PF) 4 MG/ML IV SOLN
4.0000 mg | Freq: Once | INTRAVENOUS | Status: DC
  Filled 2024-03-01: qty 1

## 2024-03-01 MED ORDER — HYDROCODONE-ACETAMINOPHEN 5-325 MG PO TABS
1.0000 | ORAL_TABLET | Freq: Once | ORAL | Status: AC
  Administered 2024-03-01: 1 via ORAL
  Filled 2024-03-01: qty 1

## 2024-03-01 MED ORDER — DEXAMETHASONE SODIUM PHOSPHATE 10 MG/ML IJ SOLN
10.0000 mg | Freq: Once | INTRAMUSCULAR | Status: AC
  Administered 2024-03-01: 10 mg via INTRAVENOUS
  Filled 2024-03-01: qty 1

## 2024-03-01 NOTE — ED Provider Notes (Signed)
 [place] EMERGENCY DEPARTMENT AT [facility] Provider Note   CSN: [id number] Arrival date & time: 03/01/24  1616     Patient presents with: Sore Throat   Susan [name] is a 48 y.o. female.   Patient is a 48 year old female who presents to the Emergency Department with a chief complaint of sore throat for approximate the past 2 days.  Patient notes that the pain is worse with swallowing.  She feels as though she is having pain in the posterior aspect of her neck.  She notes also that she feels as though she has swelling in the posterior aspect of her neck and notes that she has pain with range of motion.  She has had no abnormal headaches.  She denies any fever or chills.  There has been no chest pain, shortness breath, cough or congestion.  She denies any abdominal pain, nausea, vomiting, diarrhea.   Sore Throat       Prior to Admission medications   Medication Sig Start Date End Date Taking? Authorizing Provider  HYDROcodone -acetaminophen  (NORCO/VICODIN) 5-325 MG tablet One tablet by mouth every six hours as needed for pain.  Seven day limit 02/06/24   Yolande Lamar BROCKS, MD  methylPREDNISolone  (MEDROL  DOSEPAK) 4 MG TBPK tablet Take as directed on packaging 02/06/24   Yolande Lamar BROCKS, MD  naproxen  (EC NAPROSYN ) 500 MG EC tablet Take 500 mg by mouth 2 (two) times daily with a meal.    [provider]    Allergies: Patient has no known allergies.    Review of Systems  HENT:  Positive for sore throat.     Updated Vital Signs BP 113/65   Pulse 86   Temp 98.2 F (36.8 C)   Resp 16   Ht 5' 1 (1.549 m)   Wt 66.2 kg   LMP 02/06/2024 (Exact Date)   SpO2 98%   BMI 27.59 kg/m   Physical Exam Vitals and nursing note reviewed.  Constitutional:      General: She is not in acute distress.    Appearance: Normal appearance. She is not ill-appearing.  HENT:     Head: Normocephalic and atraumatic.     Nose: Nose normal.     Mouth/Throat:     Mouth:  Mucous membranes are moist.     Pharynx: Posterior oropharyngeal erythema present. No pharyngeal swelling or oropharyngeal exudate.     Tonsils: No tonsillar exudate.  Eyes:     Extraocular Movements: Extraocular movements intact.     Conjunctiva/sclera: Conjunctivae normal.     Pupils: Pupils are equal, round, and reactive to light.  Cardiovascular:     Rate and Rhythm: Normal rate and regular rhythm.     Pulses: Normal pulses.     Heart sounds: Normal heart sounds. No murmur heard.    No gallop.  Pulmonary:     Effort: Pulmonary effort is normal. No respiratory distress.     Breath sounds: Normal breath sounds. No stridor. No wheezing, rhonchi or rales.  Abdominal:     General: Abdomen is flat. Bowel sounds are normal. There is no distension.     Palpations: Abdomen is soft. There is no mass.     Tenderness: There is no abdominal tenderness. There is no guarding or rebound.  Musculoskeletal:        General: Normal range of motion.     Cervical back: Normal range of motion and neck supple.  Lymphadenopathy:     Cervical: Cervical adenopathy present.  Skin:  General: Skin is warm and dry.  Neurological:     General: No focal deficit present.     Mental Status: She is alert and oriented to person, place, and time. Mental status is at baseline.  Psychiatric:        Mood and Affect: Mood normal.        Behavior: Behavior normal.        Thought Content: Thought content normal.        Judgment: Judgment normal.     (all labs ordered are listed, but only abnormal results are displayed) Labs Reviewed  CBC WITH DIFFERENTIAL/PLATELET - Abnormal; Notable for the following components:      Result Value   WBC 12.8 (*)    RBC 3.71 (*)    MCV 101.6 (*)    MCH 34.2 (*)    Neutro Abs 8.8 (*)    All other components within normal limits  RESP PANEL BY RT-PCR (RSV, FLU A&B, COVID)  RVPGX2  GROUP A STREP BY PCR  BASIC METABOLIC PANEL WITH GFR  MONONUCLEOSIS SCREEN  HCG, SERUM,  QUALITATIVE    EKG: None  Radiology: No results found.   Procedures   Medications Ordered in the ED  dexamethasone  (DECADRON ) injection 10 mg (10 mg Intravenous Given 03/01/24 1755)  ketorolac  (TORADOL ) 15 MG/ML injection 15 mg (15 mg Intravenous Given 03/01/24 1755)  sodium chloride  0.9 % bolus 1,000 mL (1,000 mLs Intravenous New Bag/Given 03/01/24 1755)                                    Medical Decision Making Amount and/or Complexity of Data Reviewed Labs: ordered. Radiology: ordered.  Risk Prescription drug management.   This patient presents to the ED for concern of sore throat differential diagnosis includes strep, mono, viral pharyngitis, peritonsillar abscess, Ludwig's angina, retropharyngeal abscess, epiglottitis    Additional history obtained:  Additional history obtained from none External records from outside source obtained and reviewed including none   Lab Tests:  I Ordered, and personally interpreted labs.  The pertinent results include: Leukocytosis, no anemia, negative strep swab and viral swab   Imaging Studies ordered:  I ordered imaging studies including CT scan soft tissue neck I independently visualized and interpreted imaging which showed pending    Medicines ordered and prescription drug management:  I ordered medication including Decadron , Toradol , IV fluids for pharyngitis Reevaluation of the patient after these medicines showed that the patient improved I have reviewed the patients home medicines and have made adjustments as needed   Problem List / ED Course:  Patient is doing well at this time.  Discussed with patient we will obtain blood work and imaging.  She is having difficulty with range of motion of her neck and will obtain CT scan to rule out possible deep space abscess to include peritonsillar abscess, Ludwig's angina, retrograde abscess, epiglottitis.  She is tolerant secretions without difficulty at this time.  Vital  signs are stable with no indication for sepsis.  Will sign patient out to [employer], PA-C at 1900 on 03/01/24.    Social Determinants of Health:  none        Final diagnoses:  None    ED Discharge Orders     None          Daralene Lonni JONETTA DEVONNA 03/01/24 RONOLD Towana Ozell JAYSON, MD 03/02/24 [phone]

## 2024-03-01 NOTE — Discharge Instructions (Signed)
 Please take the antibiotic and steroid prescription as directed.  You have also been prescribed a mouthwash to help with your throat pain.  Rinse and spit do not swallow the mouthwash.  Please follow-up with your primary care provider for recheck or you may contact the ear nose and throat provider listed to arrange follow-up if your throat pain is not improving.

## 2024-03-01 NOTE — ED Triage Notes (Addendum)
 Pt c/o sore throat x 2 days, pain radiates to back of neck with swallowing. Denies any fevers. Before triage is finished, pt pulls out bag of chips and starts eating.

## 2024-03-01 NOTE — ED Notes (Signed)
 Pt/family received d/c paperwork at this time. After going over the paperwork any questions, comments, or concerns were answered to the best of this nurse's knowledge. The pt/family verbally acknowledged the teachings/instructions.

## 2024-03-03 NOTE — ED Provider Notes (Signed)
   Pt signed out to me by Medford Conger, PA-C pending CT soft tissue neck results  Pt here with sore throat and right sided neck pain, no fever, shortness of breath.  See previous provider note for complete H&P  Pt has been observed in the dept.  Tolerating food and oral liquids w/o difficulty  CT of soft tissue neck shows Mild supraglottic laryngeal edema, possibly infectious, w/o evidence of drainable fluid collection  Labs w/o significant abnml  Pt feeling better after medication here.  No edema, erythema of the oropharynx on my exam.  Uvula is midline and non edematous.  Will tx with steroid and abx.  I have recommended close out pt f/u with ENT.  She was also given strict ER return precautions.      Herlinda Milling, PA-C 03/03/24 1459    Towana Ozell BROCKS, MD 03/05/24 1140

## 2024-03-22 ENCOUNTER — Telehealth: Payer: Self-pay | Admitting: Orthopaedic Surgery

## 2024-03-22 NOTE — Telephone Encounter (Signed)
 DR. BRENNA   Patient has come in the office requesting a referral for PT for  her left shoulder.   She has a new phone number [phone]    She is also requesting a refill on her pain medicine    HYDROcodone -acetaminophen  (NORCO/VICODIN) 5-325 MG tablet    Pharmacy  [facility]

## 2024-03-22 NOTE — Telephone Encounter (Signed)
 Dr. Janae pt - the patient lvm requesting that an order be placed for her to got to OP PT for her lt shoulder.

## 2024-03-23 ENCOUNTER — Other Ambulatory Visit: Payer: Self-pay | Admitting: *Deleted

## 2024-03-23 DIAGNOSIS — G8929 Other chronic pain: Secondary | ICD-10-CM

## 2024-03-23 NOTE — Addendum Note (Signed)
 Addended by: MARCINE HUSBAND T on: 03/23/2024 09:27 AM   Modules accepted: Orders

## 2024-03-24 MED ORDER — HYDROCODONE-ACETAMINOPHEN 5-325 MG PO TABS: ORAL_TABLET | ORAL | 0 refills | Status: DC

## 2024-03-24 NOTE — Addendum Note (Signed)
 Addended by: BRENNA NORLEEN ORN on: 03/24/2024 11:12 AM   Modules accepted: Orders

## 2024-03-29 ENCOUNTER — Telehealth: Payer: Self-pay | Admitting: *Deleted

## 2024-03-29 MED ORDER — HYDROCODONE-ACETAMINOPHEN 5-325 MG PO TABS: ORAL_TABLET | ORAL | 0 refills | Status: DC

## 2024-03-29 NOTE — Telephone Encounter (Signed)
 pT CAME IN WANTING HER HYDROCODONE  FILLED AT [facility]

## 2024-03-30 ENCOUNTER — Encounter: Payer: Self-pay | Admitting: Radiology

## 2024-04-04 ENCOUNTER — Other Ambulatory Visit: Payer: Self-pay

## 2024-04-04 ENCOUNTER — Encounter: Payer: Self-pay | Admitting: Orthopaedic Surgery

## 2024-04-04 ENCOUNTER — Encounter (HOSPITAL_COMMUNITY): Payer: Self-pay | Admitting: Occupational Therapy

## 2024-04-04 ENCOUNTER — Ambulatory Visit (HOSPITAL_COMMUNITY): Payer: MEDICAID | Attending: Orthopaedic Surgery | Admitting: Occupational Therapy

## 2024-04-04 ENCOUNTER — Ambulatory Visit: Payer: MEDICAID | Admitting: Orthopaedic Surgery

## 2024-04-04 VITALS — BP 152/105 | HR 65 | Ht 61.0 in | Wt 146.0 lb

## 2024-04-04 DIAGNOSIS — F17210 Nicotine dependence, cigarettes, uncomplicated: Secondary | ICD-10-CM

## 2024-04-04 DIAGNOSIS — M25512 Pain in left shoulder: Secondary | ICD-10-CM

## 2024-04-04 DIAGNOSIS — G8929 Other chronic pain: Secondary | ICD-10-CM

## 2024-04-04 DIAGNOSIS — R29898 Other symptoms and signs involving the musculoskeletal system: Secondary | ICD-10-CM | POA: Insufficient documentation

## 2024-04-04 MED ORDER — METHYLPREDNISOLONE ACETATE 40 MG/ML IJ SUSP
40.0000 mg | Freq: Once | INTRAMUSCULAR | Status: AC
  Administered 2024-04-04: 40 mg via INTRA_ARTICULAR

## 2024-04-04 MED ORDER — HYDROCODONE-ACETAMINOPHEN 5-325 MG PO TABS: ORAL_TABLET | ORAL | 0 refills | Status: DC

## 2024-04-04 NOTE — Addendum Note (Signed)
 Addended by: MARCINE HUSBAND T on: 04/04/2024 04:18 PM   Modules accepted: Orders

## 2024-04-04 NOTE — Patient Instructions (Signed)
 1) Seated Row   Sit up straight with elbows by your sides. Pull back with shoulders/elbows, keeping forearms straight, as if pulling back on the reins of a horse. Squeeze shoulder blades together. Repeat _10-15__times, __2-3__sets/day    2) Shoulder Elevation    Sit up straight with arms by your sides. Slowly bring your shoulders up towards your ears. Repeat_10-15__times, __2-3__ sets/day    3) Shoulder Extension    Sit up straight with both arms by your side, draw your arms back behind your waist. Keep your elbows straight. Repeat __10-15__times, __2-3__sets/day.           1) SHOULDER: Flexion On Table   Place hands on towel placed on table, elbows straight. Lean forward with you upper body, pushing towel away from body.  Repeat 10 times. Do 3 sessions per day.  2) Abduction (Passive)   With arm out to side, resting on towel placed on table with palm DOWN, keeping trunk away from table, lean to the side while pushing towel away from body.  Repeat 10 times. Do 3 sessions per day.  Copyright  VHI. All rights reserved.     3) Internal Rotation (Assistive)   Seated with elbow bent at right angle and held against side, slide arm on table surface in an inward arc keeping elbow anchored in place. Repeat 10 times. Do 3 sessions per day. Activity: Use this motion to brush crumbs off the table.  Copyright  VHI. All rights reserved.
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
## 2024-04-04 NOTE — Progress Notes (Signed)
 PROCEDURE NOTE:  The patient request injection, verbal consent was obtained.  The left shoulder was prepped appropriately after time out was performed.   Sterile technique was observed and injection of 1 cc of DepoMedrol 40mg  with several cc's of plain xylocaine . Anesthesia was provided by ethyl chloride and a 20-gauge needle was used to inject the shoulder area. A posterior approach was used.  The injection was tolerated well.  A band aid dressing was applied.  The patient was advised to apply ice later today and tomorrow to the injection sight as needed.  Encounter Diagnoses  Name Primary?   Chronic left shoulder pain Yes   Nicotine  dependence, cigarettes, uncomplicated    Return in three weeks.  She is going to PT for the shoulder.  Continue that.  I have reviewed the [place]  Controlled Substance Reporting System web site prior to prescribing narcotic medicine for this patient.  Call if any problem.  Precautions discussed.  Electronically Signed Lemond Stable, MD 8/27/20253:17 PM

## 2024-04-17 ENCOUNTER — Ambulatory Visit (HOSPITAL_COMMUNITY): Payer: MEDICAID | Attending: Orthopaedic Surgery | Admitting: Occupational Therapy

## 2024-04-17 DIAGNOSIS — G8929 Other chronic pain: Secondary | ICD-10-CM | POA: Insufficient documentation

## 2024-04-17 DIAGNOSIS — R29898 Other symptoms and signs involving the musculoskeletal system: Secondary | ICD-10-CM | POA: Insufficient documentation

## 2024-04-17 DIAGNOSIS — M25512 Pain in left shoulder: Secondary | ICD-10-CM | POA: Insufficient documentation

## 2024-04-18 ENCOUNTER — Telehealth (HOSPITAL_COMMUNITY): Payer: Self-pay | Admitting: Occupational Therapy

## 2024-04-18 NOTE — Telephone Encounter (Signed)
 This OT called and Left a HIPAA appropriate VM regarding 1st No Show on 04/17/24. Informed pt of next appointment and request she call if she is unable to make it.   Valentin Nightingale, OTR/L [facility] [phone]

## 2024-04-19 ENCOUNTER — Ambulatory Visit (HOSPITAL_COMMUNITY): Payer: MEDICAID | Admitting: Occupational Therapy

## 2024-04-19 ENCOUNTER — Encounter (HOSPITAL_COMMUNITY): Payer: Self-pay | Admitting: Occupational Therapy

## 2024-04-19 DIAGNOSIS — R29898 Other symptoms and signs involving the musculoskeletal system: Secondary | ICD-10-CM | POA: Diagnosis present

## 2024-04-19 DIAGNOSIS — G8929 Other chronic pain: Secondary | ICD-10-CM | POA: Diagnosis present

## 2024-04-19 DIAGNOSIS — M25512 Pain in left shoulder: Secondary | ICD-10-CM | POA: Diagnosis present

## 2024-04-19 NOTE — Patient Instructions (Signed)
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
## 2024-04-23 ENCOUNTER — Encounter (HOSPITAL_COMMUNITY): Payer: MEDICAID | Admitting: Occupational Therapy

## 2024-04-25 ENCOUNTER — Encounter (HOSPITAL_COMMUNITY): Payer: Self-pay | Admitting: Occupational Therapy

## 2024-04-25 ENCOUNTER — Ambulatory Visit (HOSPITAL_COMMUNITY): Payer: MEDICAID | Admitting: Occupational Therapy

## 2024-04-25 DIAGNOSIS — M25512 Pain in left shoulder: Secondary | ICD-10-CM | POA: Diagnosis not present

## 2024-04-25 DIAGNOSIS — R29898 Other symptoms and signs involving the musculoskeletal system: Secondary | ICD-10-CM

## 2024-04-25 DIAGNOSIS — G8929 Other chronic pain: Secondary | ICD-10-CM
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
## 2024-04-25 NOTE — Patient Instructions (Signed)

## 2024-04-27 DIAGNOSIS — M2550 Pain in unspecified joint: Secondary | ICD-10-CM | POA: Insufficient documentation

## 2024-05-01 ENCOUNTER — Encounter (HOSPITAL_COMMUNITY): Payer: MEDICAID | Admitting: Occupational Therapy

## 2024-05-01 ENCOUNTER — Telehealth (HOSPITAL_COMMUNITY): Payer: Self-pay | Admitting: Occupational Therapy

## 2024-05-01 NOTE — Telephone Encounter (Signed)
 Left message for pt regarding no-show for 9/23 appt at 230. Reminded of next appt on 9/25 at 230 and requested pt call if unable to attend.   Sonny Cory, OTR/L  [phone] 05/01/24

## 2024-05-03 ENCOUNTER — Encounter (HOSPITAL_COMMUNITY): Payer: MEDICAID | Admitting: Occupational Therapy

## 2024-05-03 ENCOUNTER — Telehealth (HOSPITAL_COMMUNITY): Payer: Self-pay | Admitting: Occupational Therapy

## 2024-05-03 NOTE — Telephone Encounter (Signed)
 Called pt regarding no-show for 05/03/24 appointment. No answer, left voicemail regarding no-show. Informed pt this is her second no-show this week, can now only schedule 1 appointment at a time. Reminded pt of next appt on 05/08/24 at 330pm, informed pt that if she misses this appointment and does not call to cancel she will be discharged and require a new referral to return.    Sonny Cory, OTR/L  [phone] 05/03/24

## 2024-05-08 ENCOUNTER — Encounter (HOSPITAL_COMMUNITY): Payer: MEDICAID | Admitting: Occupational Therapy

## 2024-05-10 ENCOUNTER — Encounter (HOSPITAL_COMMUNITY): Payer: MEDICAID | Admitting: Occupational Therapy

## 2024-05-16 ENCOUNTER — Emergency Department (HOSPITAL_COMMUNITY)
Admission: EM | Admit: 2024-05-16 | Discharge: 2024-05-16 | Disposition: A | Discharge status: Home / Self Care | Payer: MEDICAID | Attending: Emergency Medicine | Admitting: Emergency Medicine

## 2024-05-16 ENCOUNTER — Other Ambulatory Visit: Payer: Self-pay

## 2024-05-16 ENCOUNTER — Encounter (HOSPITAL_COMMUNITY): Payer: Self-pay

## 2024-05-16 ENCOUNTER — Emergency Department (HOSPITAL_COMMUNITY): Payer: MEDICAID

## 2024-05-16 DIAGNOSIS — L02411 Cutaneous abscess of right axilla: Secondary | ICD-10-CM | POA: Diagnosis present

## 2024-05-16 DIAGNOSIS — D72829 Elevated white blood cell count, unspecified: Secondary | ICD-10-CM | POA: Diagnosis not present

## 2024-05-16 LAB — CBC WITH DIFFERENTIAL/PLATELET
Abs Immature Granulocytes: 0.14 K/uL — ABNORMAL HIGH (ref 0.00–0.07)
Basophils Absolute: 0.1 K/uL (ref 0.0–0.1)
Basophils Relative: 0 %
Eosinophils Absolute: 0.1 K/uL (ref 0.0–0.5)
Eosinophils Relative: 0 %
HCT: 38.5 % (ref 36.0–46.0)
Hemoglobin: 13 g/dL (ref 12.0–15.0)
Immature Granulocytes: 1 %
Lymphocytes Relative: 12 %
Lymphs Abs: 3.1 K/uL (ref 0.7–4.0)
MCH: 34.5 pg — ABNORMAL HIGH (ref 26.0–34.0)
MCHC: 33.8 g/dL (ref 30.0–36.0)
MCV: 102.1 fL — ABNORMAL HIGH (ref 80.0–100.0)
Monocytes Absolute: 1.3 K/uL — ABNORMAL HIGH (ref 0.1–1.0)
Monocytes Relative: 5 %
Neutro Abs: 22.2 K/uL — ABNORMAL HIGH (ref 1.7–7.7)
Neutrophils Relative %: 82 %
Platelets: 222 K/uL (ref 150–400)
RBC: 3.77 MIL/uL — ABNORMAL LOW (ref 3.87–5.11)
RDW: 12.6 % (ref 11.5–15.5)
WBC: 26.9 K/uL — ABNORMAL HIGH (ref 4.0–10.5)
nRBC: 0 % (ref 0.0–0.2)

## 2024-05-16 LAB — BASIC METABOLIC PANEL WITH GFR
Anion gap: 12 (ref 5–15)
BUN: 19 mg/dL (ref 6–20)
CO2: 24 mmol/L (ref 22–32)
Calcium: 8.9 mg/dL (ref 8.9–10.3)
Chloride: 104 mmol/L (ref 98–111)
Creatinine, Ser: 0.73 mg/dL (ref 0.44–1.00)
GFR, Estimated: >60 mL/min (ref >60)
Glucose, Bld: 110 mg/dL — ABNORMAL HIGH (ref 70–99)
Potassium: 4.1 mmol/L (ref 3.5–5.1)
Sodium: 140 mmol/L (ref 135–145)

## 2024-05-16 LAB — POC URINE PREG, ED: Preg Test, Ur: NEGATIVE

## 2024-05-16 MED ORDER — PREDNISONE 20 MG PO TABS: 40.0000 mg | ORAL_TABLET | Freq: Every day | ORAL | 0 refills | Status: DC

## 2024-05-16 MED ORDER — PREDNISONE 50 MG PO TABS
60.0000 mg | ORAL_TABLET | Freq: Once | ORAL | Status: AC
  Administered 2024-05-16: 60 mg via ORAL
  Filled 2024-05-16: qty 1

## 2024-05-16 MED ORDER — DOXYCYCLINE HYCLATE 100 MG PO CAPS: 100.0000 mg | ORAL_CAPSULE | Freq: Two times a day (BID) | ORAL | 0 refills | Status: DC

## 2024-05-16 MED ORDER — OXYCODONE-ACETAMINOPHEN 5-325 MG PO TABS
1.0000 | ORAL_TABLET | Freq: Once | ORAL | Status: AC
  Administered 2024-05-16: 1 via ORAL
  Filled 2024-05-16: qty 1

## 2024-05-16 MED ORDER — IBUPROFEN 800 MG PO TABS
800.0000 mg | ORAL_TABLET | Freq: Once | ORAL | Status: AC
  Administered 2024-05-16: 800 mg via ORAL
  Filled 2024-05-16: qty 1

## 2024-05-16 MED ORDER — DOXYCYCLINE HYCLATE 100 MG PO TABS
100.0000 mg | ORAL_TABLET | Freq: Once | ORAL | Status: AC
Start: 2024-05-16 — End: 2024-05-16
  Administered 2024-05-16: 100 mg via ORAL
  Filled 2024-05-16: qty 1

## 2024-05-16 MED ORDER — HYDROCODONE-ACETAMINOPHEN 5-325 MG PO TABS: ORAL_TABLET | ORAL | 0 refills | Status: DC

## 2024-05-16 NOTE — ED Provider Notes (Signed)
 [place] EMERGENCY DEPARTMENT AT [facility] Provider Note   CSN: [id number] Arrival date & time: 05/16/24  1230     Patient presents with: Abscess   Susan [name] is a 48 y.o. female.    Abscess Associated symptoms: no fever, no headaches, no nausea and no vomiting        Susan [name] is a 48 y.o. female who presents to the Emergency Department complaining of pain, redness and swelling of her right axilla.  She noticed a small hard, red bump to her armpit yesterday and squeezed it with only a small amt of blood returned.  Today, she woke with worsening pain and swelling under her arm.  Pain worsens with palpation and movement of her right arm.  Denies numbness, fever and chills.  No drainage of the area.  No history of DM or frequent boils.  She does admit to shaving her underarms recently.    Prior to Admission medications   Medication Sig Start Date End Date Taking? Authorizing Provider  HYDROcodone -acetaminophen  (NORCO/VICODIN) 5-325 MG tablet One tablet by mouth every six hours as needed for pain.  Seven day limit 04/04/24   Brenna Lin, MD  naproxen  (EC NAPROSYN ) 500 MG EC tablet Take 500 mg by mouth 2 (two) times daily with a meal.    [provider]    Allergies: Patient has no known allergies.    Review of Systems  Constitutional:  Negative for chills and fever.  Respiratory:  Negative for shortness of breath.   Cardiovascular:  Negative for chest pain.  Gastrointestinal:  Negative for nausea and vomiting.  Musculoskeletal:  Negative for neck pain.  Skin:  Positive for color change (redness right axilla area).  Neurological:  Negative for dizziness and headaches.    Updated Vital Signs BP 104/65 (BP Location: Left Arm)   Pulse 93   Temp 98.6 F (37 C) (Oral)   Resp 16   Wt 72.6 kg   LMP 05/09/2024   SpO2 97%   BMI 30.23 kg/m   Physical Exam Vitals and nursing note reviewed.  Constitutional:      Appearance: Normal appearance.   Cardiovascular:     Rate and Rhythm: Normal rate and regular rhythm.     Pulses: Normal pulses.  Pulmonary:     Effort: Pulmonary effort is normal.  Musculoskeletal:        General: Normal range of motion.     Cervical back: Normal range of motion. No rigidity.     Comments: Localized ttp of the entire right axilla.  Mild erythema.  Firm, non mobile nodule present.  No drainage or fluctuance on exam  Lymphadenopathy:     Cervical: No cervical adenopathy.  Skin:    General: Skin is warm.     Capillary Refill: Capillary refill takes less than 2 seconds.  Neurological:     General: No focal deficit present.     Mental Status: She is alert.     Sensory: No sensory deficit.     Motor: No weakness.     (all labs ordered are listed, but only abnormal results are displayed) Labs Reviewed  CBC WITH DIFFERENTIAL/PLATELET - Abnormal; Notable for the following components:      Result Value   WBC 26.9 (*)    RBC 3.77 (*)    MCV 102.1 (*)    MCH 34.5 (*)    Neutro Abs 22.2 (*)    Monocytes Absolute 1.3 (*)    Abs Immature  Granulocytes 0.14 (*)    All other components within normal limits  BASIC METABOLIC PANEL WITH GFR - Abnormal; Notable for the following components:   Glucose, Bld 110 (*)    All other components within normal limits  POC URINE PREG, ED - Normal    EKG: None  Radiology: US  AXILLA RIGHT Result Date: 05/16/2024 CLINICAL DATA:  48 year old female complaining of inflammation and erythema in the right axilla. Rule out abscess. EXAM: ULTRASOUND OF THE RIGHT AXILLA COMPARISON:  None. FINDINGS: Ultrasound is performed, showing small complex fluid collection in the right axilla measuring 1.1 x 0.5 x 0.8 cm. There is stranding of the surrounding tissue consistent with inflammation. IMPRESSION: 1.1 cm complex fluid collection in the right axilla consistent with a small. There is surrounding inflammatory changes. RECOMMENDATION: Treatment planning of the right axillary abscess  and surrounding inflammation is recommended. I have discussed the findings and recommendations with the patient. If applicable, a reminder letter will be sent to the patient regarding the next appointment. BI-RADS CATEGORY  2: Benign. Electronically Signed   By: Dina  Arceo M.D.   On: 05/16/2024 14:47      Procedures   Medications Ordered in the ED  oxyCODONE -acetaminophen  (PERCOCET/ROXICET) 5-325 MG per tablet 1 tablet (has no administration in time range)    Clinical Course as of 05/16/24 1654  Wed May 16, 2024  1646 Patient is here with pain and swelling in her right axilla ongoing for 2 days.  She says she been shaving her armpits prior to that.  On exam she does have some mild erythema with tender lymphadenopathy under the right axilla.  I am not able to palpate a fluctuant mass.  Labs are notable for leukocytosis white blood cell count 26.9.  Patient is otherwise afebrile without systemic signs of infection.  Ultrasound of the skin was performed, with results pending.  I suspect the patient may have a cellulitis and a reactive lymphadenitis.  I have a lower suspicion for sepsis or deep space infection.  I do not think she needs an emergent CT scan at this time.  I think it is reasonable to treat the patient with anti-inflammatory medications, course of steroids and ibuprofen , as well as doxycycline  for 1 week.  Advised that she apply ice as needed and cooling packs, and to avoid shaving her armpit or using any deodorant or irritating skin products. [MT]  1649 Although ultrasound raises concern for potential very small abscess, I do not think that this requires I&D as it's size is less than 1 cm, and I have some concern about injury to surrounding structures w/ nearby lymph nodes.  I think antibiotic treatment would be reasonable  [MT]    Clinical Course User Index [MT] Cottie Donnice PARAS, MD                                 Medical Decision Making Patient here for evaluation of pain redness  and swelling of the right axilla.  Symptoms began after she noticed a small bump to the area that she squeezed and the following day had increased swelling and pain to the area.  She denies any fever or chills nausea vomiting or radiating pain.  Differential would include but not limited to developing abscess, inflammatory process due to trauma, lymphadenopathy, cellulitis  Amount and/or Complexity of Data Reviewed Labs: ordered.    Details: Patient does have significant leukocytosis with white count of  26.9, her hemoglobin is unremarkable chemistries without derangement Radiology: ordered.    Details: Ultrasound of the axilla shows a 1 cm fluid collection in the axilla with surrounding inflammatory changes Discussion of management or test interpretation with external provider(s): Patient also seen by Dr. Cottie care plan discussed  I feel that her symptoms are mostly related to inflammatory changes of the area.  There does appear to be a very small developing abscess to the area.  Do not feel that I&D is indicated at this time given its size.  Will treat with compresses and doxycycline .  Steroid NSAID for inflammation.  Patient encouraged to avoid further trauma to the area and to also avoid shaving or using deodorant at this time.  She will follow-up closely outpatient with PCP and ER return precautions were also given.  Risk Prescription drug management.        Final diagnoses:  Abscess of axilla, right    ED Discharge Orders     None          Herlinda Milling, PA-C 05/18/24 2150    Cottie Donnice PARAS, MD 05/21/24 [phone]

## 2024-05-16 NOTE — Discharge Instructions (Addendum)
 Apply ice packs on and off to help with swelling and discomfort.  Avoid squeezing the area or sticking needles in the affected area.  Take the antibiotic as directed until finished.  Please keep your appointment with your primary care provider for next week for recheck and to have your white blood cell count rechecked as well

## 2024-05-16 NOTE — ED Triage Notes (Signed)
 Pt reports having a boil in her right axilla that she tried to squeeze yesterday and now its worse.

## 2024-05-18 ENCOUNTER — Emergency Department (HOSPITAL_COMMUNITY)
Admission: EM | Admit: 2024-05-18 | Discharge: 2024-05-18 | Disposition: A | Discharge status: Home / Self Care | Payer: MEDICAID

## 2024-05-18 ENCOUNTER — Encounter (HOSPITAL_COMMUNITY): Payer: Self-pay

## 2024-05-18 ENCOUNTER — Other Ambulatory Visit: Payer: Self-pay

## 2024-05-18 DIAGNOSIS — L02411 Cutaneous abscess of right axilla: Secondary | ICD-10-CM | POA: Insufficient documentation

## 2024-05-18 MED ORDER — HYDROCODONE-ACETAMINOPHEN 5-325 MG PO TABS: 1.0000 | ORAL_TABLET | Freq: Four times a day (QID) | ORAL | 0 refills | Status: DC | PRN

## 2024-05-18 MED ORDER — HYDROCODONE-ACETAMINOPHEN 5-325 MG PO TABS
1.0000 | ORAL_TABLET | Freq: Once | ORAL | Status: AC
  Administered 2024-05-18: 1 via ORAL
  Filled 2024-05-18: qty 1

## 2024-05-18 MED ORDER — LIDOCAINE HCL (PF) 1 % IJ SOLN
30.0000 mL | Freq: Once | INTRAMUSCULAR | Status: AC
  Administered 2024-05-18: 30 mL
  Filled 2024-05-18: qty 30

## 2024-05-18 NOTE — ED Notes (Signed)
 PA at bedside.

## 2024-05-18 NOTE — Discharge Instructions (Addendum)
 It was a pleasure taking care of you today.  You were evaluated for a worsening abscess in your right armpit.  Continue the antibiotics you are prescribed at home as well as the naproxen .  I have prescribed a few more hydrocodone  to help when the pain is more severe.  Keep the area clean and dry, do not soak it.  Have a recheck in approximately 2 days to have the packing removed and have a wound check.  If you develop fever, nausea or vomiting, worsening pain and swelling or any other worrisome changes come back to the ER right away.  Change the gauze least once a day or as needed if it has soaked through.

## 2024-05-18 NOTE — ED Triage Notes (Signed)
 Pt states that she was just here for the abscess on 10/8 and was prescribed a steroid and antibiotics. Pt states it feels like it is getting bigger and still hurts.

## 2024-05-18 NOTE — ED Provider Notes (Signed)
 [place] EMERGENCY DEPARTMENT AT [facility] Provider Note   CSN: [id number] Arrival date & time: 05/18/24  9165     Patient presents with: Abscess   Susan [name] is a 48 y.o. female.  She is here for swelling and redness to the right axilla that started 4 days ago, she was seen in the ER 2 days ago and had ultrasound that showed cellulitis with possible very small abscess, patient started on antibiotics and steroids but she states the swelling and pain has gotten worse, she denies fever but has had chills.  No nausea or vomiting.  States the pain kept her awake all night which is what brought her in the ER this morning.  No history diabetes, no history of at bedtime or frequent abscesses.  This that started after having shaved in her arms.      Abscess      Prior to Admission medications   Medication Sig Start Date End Date Taking? Authorizing Provider  doxycycline  (VIBRAMYCIN ) 100 MG capsule Take 1 capsule (100 mg total) by mouth 2 (two) times daily. 05/16/24   Triplett, Tammy, PA-C  HYDROcodone -acetaminophen  (NORCO/VICODIN) 5-325 MG tablet Take one tab po q 4 hrs prn pain 05/16/24   Triplett, Tammy, PA-C  naproxen  (EC NAPROSYN ) 500 MG EC tablet Take 500 mg by mouth 2 (two) times daily with a meal.    [provider]  predniSONE  (DELTASONE ) 20 MG tablet Take 2 tablets (40 mg total) by mouth daily. 05/16/24   Triplett, Tammy, PA-C    Allergies: Patient has no known allergies.    Review of Systems  Updated Vital Signs BP (!) 140/81   Pulse 83   Temp 98.2 F (36.8 C) (Oral)   Resp 17   Ht 5' 1 (1.549 m)   Wt 72.6 kg   LMP 05/09/2024   SpO2 99%   BMI 30.23 kg/m   Physical Exam Vitals and nursing note reviewed.  Constitutional:      General: She is not in acute distress.    Appearance: She is well-developed.  HENT:     Head: Normocephalic and atraumatic.  Eyes:     Conjunctiva/sclera: Conjunctivae normal.  Cardiovascular:     Rate and Rhythm:  Normal rate and regular rhythm.     Heart sounds: No murmur heard. Pulmonary:     Effort: Pulmonary effort is normal. No respiratory distress.     Breath sounds: Normal breath sounds.  Abdominal:     Palpations: Abdomen is soft.     Tenderness: There is no abdominal tenderness.  Musculoskeletal:        General: No swelling.     Cervical back: Neck supple.  Skin:    General: Skin is warm and dry.     Capillary Refill: Capillary refill takes less than 2 seconds.  Neurological:     Mental Status: She is alert.  Psychiatric:        Mood and Affect: Mood normal.     (all labs ordered are listed, but only abnormal results are displayed) Labs Reviewed - No data to display  EKG: None  Radiology: US  AXILLA RIGHT Result Date: 05/16/2024 CLINICAL DATA:  48 year old female complaining of inflammation and erythema in the right axilla. Rule out abscess. EXAM: ULTRASOUND OF THE RIGHT AXILLA COMPARISON:  None. FINDINGS: Ultrasound is performed, showing small complex fluid collection in the right axilla measuring 1.1 x 0.5 x 0.8 cm. There is stranding of the surrounding tissue consistent with inflammation. IMPRESSION:  1.1 cm complex fluid collection in the right axilla consistent with a small. There is surrounding inflammatory changes. RECOMMENDATION: Treatment planning of the right axillary abscess and surrounding inflammation is recommended. I have discussed the findings and recommendations with the patient. If applicable, a reminder letter will be sent to the patient regarding the next appointment. BI-RADS CATEGORY  2: Benign. Electronically Signed   By: Dina  Arceo M.D.   On: 05/16/2024 14:47     .Ultrasound ED Soft Tissue  Date/Time: 05/18/2024 9:47 AM  Performed by: Suellen Sherran LABOR, PA-C Authorized by: Suellen Sherran LABOR, PA-C   Procedure details:    Indications: localization of abscess     Transverse view:  Visualized   Longitudinal view:  Visualized   Images: not archived    Location:    Location: axilla     Side:  Right Findings:     abscess present .Incision and Drainage  Date/Time: 05/18/2024 9:49 AM  Performed by: Suellen Sherran LABOR, PA-C Authorized by: Suellen Sherran LABOR, PA-C   Consent:    Consent obtained:  Verbal   Consent given by:  Patient   Risks discussed:  Bleeding, incomplete drainage, pain, damage to other organs and infection Universal protocol:    Procedure explained and questions answered to patient or proxy's satisfaction: yes     Patient identity confirmed:  Verbally with patient Location:    Type:  Abscess   Location: Right axilla. Pre-procedure details:    Skin preparation:  Chlorhexidine Sedation:    Sedation type:  None Anesthesia:    Anesthesia method:  Local infiltration   Local anesthetic:  Lidocaine  1% w/o epi Procedure type:    Complexity:  Complex Procedure details:    Needle aspiration: yes     Needle size:  18 G   Incision types:  Single straight   Wound management:  Probed and deloculated and irrigated with saline   Drainage:  Purulent   Drainage amount:  Moderate   Packing materials:  1/4 in iodoform gauze Post-procedure details:    Procedure completion:  Tolerated well, no immediate complications    Medications Ordered in the ED  lidocaine  (PF) (XYLOCAINE ) 1 % injection 30 mL (has no administration in time range)  HYDROcodone -acetaminophen  (NORCO/VICODIN) 5-325 MG per tablet 1 tablet (has no administration in time range)                                    Medical Decision Making This patient presents to the ED for concern of right axillary abscess, this involves an extensive number of treatment options, and is a complaint that carries with it a high risk of complications and morbidity.  The differential diagnosis includes abscess, cellulitis, epidermal inclusion cyst, cellulitis, lymphadenitis, other`    Additional history obtained:  Additional history obtained from EMR External records from  outside source obtained and reviewed including prior notes and labs   Problem List / ED Course / Critical interventions / Medication management  Right axillary abscess-seen several days ago and put on antibiotics for axillary cellulitis with possible very small abscess, at that time the abscess was very small, not draining at that time.  Patient is having worsening pain and swelling at this time I was able to identify abscess with ultrasound and confirmed purulence with needle aspiration.  Discussed risks and benefits with the patient and she is agreeable with incision and drainage which was performed as noted in  the procedure section.  Patient tolerated this well.  She states she only has a couple of the Norco leftover and would like some more as the area is still very sore and swollen which seems reasonable as she has also been taking NSAIDs.  Will give a very small quantity as chart review does show she has history of substance use. Is on wound care, follow-up in 2 days for recheck either here or urgent care for packing removal.  Patient is not having fever, not tachycardic or febrile here, not tachypneic.  She did have a significant leukocytosis at her last visit but was not meeting any other SIRS criteria at otherwise at that time there.  I do not feel that repeating labs today would be beneficial.  She has been on steroids for likely still has a leukocytosis but will not change my management.  I feel she will improve with this source control and continuing her antibiotics at home.  Patient was given strict return precautions I ordered medication including Norco for pain  Reevaluation of the patient after these medicines showed that the patient improved I have reviewed the patients home medicines and have made adjustments as needed       Risk Prescription drug management.        Final diagnoses:  None    ED Discharge Orders     None          Suellen Sherran DELENA DEVONNA 05/18/24 1009    Simon Lavonia SAILOR, MD 05/18/24 1331

## 2024-05-29 ENCOUNTER — Telehealth: Payer: Self-pay

## 2024-05-29 NOTE — Telephone Encounter (Signed)
 Called to remind former [facility] client and shopper of [employer] about the more in your basket class tomorrow 05/29/24 from 130 - 230 at the [employer]   She answered and states she will be there.   Susan JONELLE Skeen RN [employer]

## 2024-06-11 ENCOUNTER — Encounter: Payer: Self-pay | Admitting: Radiology

## 2024-07-09 ENCOUNTER — Encounter: Payer: Self-pay | Admitting: Orthopedic Surgery

## 2024-07-09 ENCOUNTER — Other Ambulatory Visit: Payer: MEDICAID

## 2024-07-09 ENCOUNTER — Ambulatory Visit: Payer: MEDICAID | Admitting: Orthopedic Surgery

## 2024-07-09 VITALS — BP 163/118 | HR 81 | Ht 61.0 in | Wt 159.0 lb

## 2024-07-09 DIAGNOSIS — M7711 Lateral epicondylitis, right elbow: Secondary | ICD-10-CM

## 2024-07-09 DIAGNOSIS — M5126 Other intervertebral disc displacement, lumbar region: Secondary | ICD-10-CM | POA: Insufficient documentation

## 2024-07-09 DIAGNOSIS — M25521 Pain in right elbow: Secondary | ICD-10-CM

## 2024-07-09 MED ORDER — METHYLPREDNISOLONE ACETATE 40 MG/ML IJ SUSP
40.0000 mg | Freq: Once | INTRAMUSCULAR | Status: AC
  Administered 2024-07-09: 40 mg via INTRA_ARTICULAR

## 2024-07-09 MED ORDER — INDOMETHACIN 25 MG PO CAPS: 25.0000 mg | ORAL_CAPSULE | Freq: Three times a day (TID) | ORAL | 2 refills | Status: AC

## 2024-07-09 NOTE — Progress Notes (Signed)
  Intake history:  Chief Complaint  Patient presents with   Elbow Pain    Right     BP (!) 163/118   Pulse 81   Ht 5' 1 (1.549 m)   Wt 159 lb (72.1 kg)   BMI 30.04 kg/m  Body mass index is 30.04 kg/m.  Pharmacy? ___[facility] ___________________________________  WHAT ARE WE SEEING YOU FOR TODAY?   R Elbow   How long has this bothered you? (DOI?DOS?WS?)  Long time more than a year  Was there an injury? No  Anticoag.  No   Any ALLERGIES ____________No Known Allergies __________________________________   Treatment:  Have you taken:  Tylenol  Yes  Advil  Yes  Had PT No  Had injection No  Other  ______________ran out of meds doesn't know what meds she was taking but ran out___________

## 2024-07-09 NOTE — Progress Notes (Signed)
   Chief Complaint  Patient presents with   Elbow Pain    Right    HPI  This is a 48 year old female presents with 1 year history of right elbow pain which has worsened over the last few months secondary to lifting something heavy  She complains of pain over the lateral epicondyle it is worse with power grip activities and lifting  PHYSICAL EXAM:   Focused exam over the right elbow  She has full extension painful flexion up to 110 degrees  She has tenderness over the lateral epicondyle and pain with wrist extension especially against resistance  There are no collateral ligament instability findings  Neurovascular exam is intact  Imaging was performed  DG Elbow 2 Views Right Result Date: 07/09/2024 Elbow imaging right elbow lateral elbow pain The articulations involving the bones around the elbow joint are normal there is no sign of elbow effusion or arthritis Impression normal x-ray      Assessment and Plan:   Encounter Diagnosis  Name Primary?   Lateral epicondylitis, right elbow Yes    Recommend standard treatment for lateral epicondylitis  Patient education including diagrams and imaging  Cryotherapy  Anti-inflammatories  Topical creams  Injection  Tennis elbow brace  Procedure note injection for right tennis elbow   Diagnosis right tennis elbow  Anesthesia ethyl chloride was used Alcohol use is clean the skin  After we obtained verbal consent and timeout a 25-gauge needle was used to inject 40 mg of Depo-Medrol  and 3 cc of 1% lidocaine  just distal to the insertion of the ECRB  There were no complications and a sterile bandage was applied.   Referral to pain management for chronic opioid therapy  Meds ordered this encounter  Medications   methylPREDNISolone  acetate (DEPO-MEDROL ) injection 40 mg   indomethacin (INDOCIN) 25 MG capsule    Sig: Take 1 capsule (25 mg total) by mouth 3 (three) times daily with meals.    Dispense:  30 capsule     Refill:  2

## 2024-07-09 NOTE — Patient Instructions (Signed)
 [facility] [address]  [phone]  We will send referral there, you call next week to schedule, they will call you too.

## 2024-07-10 ENCOUNTER — Emergency Department (HOSPITAL_COMMUNITY)
Admission: EM | Admit: 2024-07-10 | Discharge: 2024-07-11 | Disposition: A | Payer: MEDICAID | Attending: Emergency Medicine | Admitting: Emergency Medicine

## 2024-07-10 DIAGNOSIS — S4992XA Unspecified injury of left shoulder and upper arm, initial encounter: Secondary | ICD-10-CM | POA: Diagnosis present

## 2024-07-10 DIAGNOSIS — W25XXXA Contact with sharp glass, initial encounter: Secondary | ICD-10-CM | POA: Insufficient documentation

## 2024-07-10 DIAGNOSIS — Y908 Blood alcohol level of 240 mg/100 ml or more: Secondary | ICD-10-CM | POA: Insufficient documentation

## 2024-07-10 DIAGNOSIS — J45909 Unspecified asthma, uncomplicated: Secondary | ICD-10-CM | POA: Diagnosis not present

## 2024-07-10 DIAGNOSIS — S20419A Abrasion of unspecified back wall of thorax, initial encounter: Secondary | ICD-10-CM | POA: Insufficient documentation

## 2024-07-10 DIAGNOSIS — S40812A Abrasion of left upper arm, initial encounter: Secondary | ICD-10-CM | POA: Insufficient documentation

## 2024-07-10 DIAGNOSIS — F10920 Alcohol use, unspecified with intoxication, uncomplicated: Secondary | ICD-10-CM

## 2024-07-10 DIAGNOSIS — S40811A Abrasion of right upper arm, initial encounter: Secondary | ICD-10-CM | POA: Diagnosis not present

## 2024-07-10 DIAGNOSIS — F10129 Alcohol abuse with intoxication, unspecified: Secondary | ICD-10-CM | POA: Diagnosis not present

## 2024-07-10 LAB — CBC WITH DIFFERENTIAL/PLATELET
Abs Immature Granulocytes: 0.12 K/uL — ABNORMAL HIGH (ref 0.00–0.07)
Basophils Absolute: 0.1 K/uL (ref 0.0–0.1)
Basophils Relative: 1 %
Eosinophils Absolute: 0 K/uL (ref 0.0–0.5)
Eosinophils Relative: 0 %
HCT: 41 % (ref 36.0–46.0)
Hemoglobin: 13.9 g/dL (ref 12.0–15.0)
Immature Granulocytes: 1 %
Lymphocytes Relative: 26 %
Lymphs Abs: 3.2 K/uL (ref 0.7–4.0)
MCH: 33.3 pg (ref 26.0–34.0)
MCHC: 33.9 g/dL (ref 30.0–36.0)
MCV: 98.1 fL (ref 80.0–100.0)
Monocytes Absolute: 0.6 K/uL (ref 0.1–1.0)
Monocytes Relative: 4 %
Neutro Abs: 8.6 K/uL — ABNORMAL HIGH (ref 1.7–7.7)
Neutrophils Relative %: 68 %
Platelets: 351 K/uL (ref 150–400)
RBC: 4.18 MIL/uL (ref 3.87–5.11)
RDW: 12.6 % (ref 11.5–15.5)
WBC: 12.6 K/uL — ABNORMAL HIGH (ref 4.0–10.5)
nRBC: 0 % (ref 0.0–0.2)

## 2024-07-10 LAB — URINE DRUG SCREEN
Amphetamines: NEGATIVE
Barbiturates: NEGATIVE
Benzodiazepines: NEGATIVE
Cocaine: POSITIVE — AB
Fentanyl: NEGATIVE
Methadone Scn, Ur: NEGATIVE
Opiates: NEGATIVE
Tetrahydrocannabinol: NEGATIVE

## 2024-07-10 LAB — ETHANOL: Alcohol, Ethyl (B): 286 mg/dL — ABNORMAL HIGH (ref <15)

## 2024-07-10 LAB — COMPREHENSIVE METABOLIC PANEL WITH GFR
ALT: 24 U/L (ref 0–44)
AST: 29 U/L (ref 15–41)
Albumin: 5.2 g/dL — ABNORMAL HIGH (ref 3.5–5.0)
Alkaline Phosphatase: 70 U/L (ref 38–126)
Anion gap: 18 — ABNORMAL HIGH (ref 5–15)
BUN: 17 mg/dL (ref 6–20)
CO2: 20 mmol/L — ABNORMAL LOW (ref 22–32)
Calcium: 9.4 mg/dL (ref 8.9–10.3)
Chloride: 103 mmol/L (ref 98–111)
Creatinine, Ser: 0.77 mg/dL (ref 0.44–1.00)
GFR, Estimated: >60 mL/min (ref >60)
Glucose, Bld: 126 mg/dL — ABNORMAL HIGH (ref 70–99)
Potassium: 3.4 mmol/L — ABNORMAL LOW (ref 3.5–5.1)
Sodium: 141 mmol/L (ref 135–145)
Total Bilirubin: 0.3 mg/dL (ref 0.0–1.2)
Total Protein: 8 g/dL (ref 6.5–8.1)

## 2024-07-10 LAB — URINALYSIS, ROUTINE W REFLEX MICROSCOPIC
Bilirubin Urine: NEGATIVE
Glucose, UA: NEGATIVE mg/dL
Hgb urine dipstick: NEGATIVE
Ketones, ur: NEGATIVE mg/dL
Leukocytes,Ua: NEGATIVE
Nitrite: NEGATIVE
Protein, ur: NEGATIVE mg/dL
Specific Gravity, Urine: 1.003 — ABNORMAL LOW (ref 1.005–1.030)
pH: 6 (ref 5.0–8.0)

## 2024-07-10 LAB — POC URINE PREG, ED: Preg Test, Ur: NEGATIVE

## 2024-07-10 LAB — MAGNESIUM: Magnesium: 2.6 mg/dL — ABNORMAL HIGH (ref 1.7–2.4)

## 2024-07-10 MED ORDER — ZIPRASIDONE MESYLATE 20 MG IM SOLR
20.0000 mg | INTRAMUSCULAR | Status: AC | PRN
  Administered 2024-07-10: 20 mg via INTRAMUSCULAR
  Filled 2024-07-10: qty 20

## 2024-07-10 MED ORDER — STERILE WATER FOR INJECTION IJ SOLN
INTRAMUSCULAR | Status: AC
  Administered 2024-07-10: 1.2 mL
  Filled 2024-07-10: qty 10

## 2024-07-10 MED ORDER — ZIPRASIDONE MESYLATE 20 MG IM SOLR: 20.0000 mg | Freq: Once | INTRAMUSCULAR | Status: DC

## 2024-07-10 MED ORDER — TETANUS-DIPHTH-ACELL PERTUSSIS 5-2-15.5 LF-MCG/0.5 IM SUSP: 0.5000 mL | Freq: Once | INTRAMUSCULAR | Status: DC

## 2024-07-10 NOTE — ED Provider Notes (Signed)
 [place] EMERGENCY DEPARTMENT AT [facility] Provider Note   CSN: [id number] Arrival date & time: 07/10/24  2048     Patient presents with: Alcohol Intoxication   Susan [name] is a 48 y.o. female.   HPI Patient dents for abnormal behavior.  Medical history includes polysubstance abuse, GERD, depression, asthma.  She arrives to the ED in please custody.  She was reportedly breaking windows at home and throwing things out of the house.  She was reportedly drinking alcohol this evening.  Police report that patient is too intoxicated to go to jail.  Patient unable to participate in history.  This patient with history of polysubstance abuse, presented to [facility] emergency department in police custody for erratic and violent behavior.  Patient was destructive of property at home.  She has been agitated and aggressive with police as well as the emergency department staff.  At this time, she is a threat of harm to herself and others.    Prior to Admission medications   Medication Sig Start Date End Date Taking? Authorizing Provider  doxycycline  (VIBRAMYCIN ) 100 MG capsule Take 1 capsule (100 mg total) by mouth 2 (two) times daily. 05/16/24   Triplett, Tammy, PA-C  HYDROcodone -acetaminophen  (NORCO/VICODIN) 5-325 MG tablet Take 1 tablet by mouth every 6 (six) hours as needed for moderate pain (pain score 4-6). 05/18/24   Suellen Cantor A, PA-C  indomethacin  (INDOCIN ) 25 MG capsule Take 1 capsule (25 mg total) by mouth 3 (three) times daily with meals. 07/09/24 08/08/24  Margrette Taft BRAVO, MD  predniSONE  (DELTASONE ) 20 MG tablet Take 2 tablets (40 mg total) by mouth daily. 05/16/24   Triplett, Tammy, PA-C    Allergies: Patient has no known allergies.    Review of Systems  Unable to perform ROS: Mental status change    Updated Vital Signs Pulse 82   Temp 98.9 F (37.2 C)   Resp 18   Wt 72.1 kg   SpO2 96%   BMI 30.04 kg/m   Physical Exam Vitals and nursing note  reviewed.  Constitutional:      General: She is not in acute distress.    Appearance: Normal appearance. She is well-developed. She is not ill-appearing, toxic-appearing or diaphoretic.  HENT:     Head: Normocephalic and atraumatic.     Right Ear: External ear normal.     Left Ear: External ear normal.     Nose: Nose normal.     Mouth/Throat:     Mouth: Mucous membranes are moist.  Eyes:     Extraocular Movements: Extraocular movements intact.     Conjunctiva/sclera: Conjunctivae normal.  Cardiovascular:     Rate and Rhythm: Normal rate and regular rhythm.  Pulmonary:     Effort: Pulmonary effort is normal. No respiratory distress.  Abdominal:     General: There is no distension.     Palpations: Abdomen is soft.     Tenderness: There is no abdominal tenderness.  Musculoskeletal:        General: No swelling. Normal range of motion.     Cervical back: Normal range of motion and neck supple.  Skin:    General: Skin is warm and dry.     Coloration: Skin is not jaundiced or pale.     Comments: Superficial abrasions to arms and left back  Neurological:     General: No focal deficit present.     Mental Status: She is alert.  Psychiatric:  Mood and Affect: Mood normal. Affect is labile.        Speech: Speech is slurred.        Behavior: Behavior is agitated.     (all labs ordered are listed, but only abnormal results are displayed) Labs Reviewed  COMPREHENSIVE METABOLIC PANEL WITH GFR - Abnormal; Notable for the following components:      Result Value   Potassium 3.4 (*)    CO2 20 (*)    Glucose, Bld 126 (*)    Albumin 5.2 (*)    Anion gap 18 (*)    All other components within normal limits  ETHANOL - Abnormal; Notable for the following components:   Alcohol, Ethyl (B) 286 (*)    All other components within normal limits  URINE DRUG SCREEN - Abnormal; Notable for the following components:   Cocaine POSITIVE (*)    All other components within normal limits  CBC  WITH DIFFERENTIAL/PLATELET - Abnormal; Notable for the following components:   WBC 12.6 (*)    Neutro Abs 8.6 (*)    Abs Immature Granulocytes 0.12 (*)    All other components within normal limits  URINALYSIS, ROUTINE W REFLEX MICROSCOPIC - Abnormal; Notable for the following components:   Color, Urine STRAW (*)    Specific Gravity, Urine 1.003 (*)    All other components within normal limits  MAGNESIUM  - Abnormal; Notable for the following components:   Magnesium  2.6 (*)    All other components within normal limits  POC URINE PREG, ED    EKG: None  Radiology: DG Elbow 2 Views Right Result Date: 07/09/2024 Elbow imaging right elbow lateral elbow pain The articulations involving the bones around the elbow joint are normal there is no sign of elbow effusion or arthritis Impression normal x-ray     Procedures   Medications Ordered in the ED  Tdap (ADACEL) injection 0.5 mL (has no administration in time range)  ziprasidone  (GEODON ) injection 20 mg (20 mg Intramuscular Given 07/10/24 2133)  sterile water  (preservative free) injection (1.2 mLs  Given 07/10/24 2133)                                    Medical Decision Making Amount and/or Complexity of Data Reviewed Labs: ordered.  Risk Prescription drug management.   Patient presenting for intoxication.  She arrives to the ED in police custody.  On arrival, she was initially agitated and combative.  When brought out to the ED, she is a bit more calm.  She has some superficial lacerations to her arms and left latissimus dorsi area.  She has slurred speech and unsteady gait.  Laboratory workup was initiated.  Lab work notable for mild leukocytosis, elevated ethanol level, cocaine positivity on UDS.  Due to her ongoing agitation while in the ED, she did receive Geodon .  IVF's paperwork was completed.  Per police officers, patient will not remain in police custody unless her landlord filed charges.  On reassessment, patient sleeping.   Care of patient signed out to oncoming ED provider.     Final diagnoses:  Alcoholic intoxication without complication    ED Discharge Orders     None          Melvenia Motto, MD 07/10/24 2254
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
## 2024-07-10 NOTE — ED Provider Notes (Signed)
  Provider Note MRN:  [id number]  Arrival date & time: 07/11/24    ED Course and Medical Decision Making  Assumed care of patient at sign-out or upon transfer.  Alcohol intoxication with agitation, received Geodon , will allow metabolization and reassess.  6:45 AM update: Patient is feeling a lot better, wakes easily, fully conversant, ready to go home.  Plan is for discharge.  Procedures  Final Clinical Impressions(s) / ED Diagnoses     ICD-10-CM   1. Alcoholic intoxication without complication  F10.920       ED Discharge Orders     None         Discharge Instructions      You were evaluated in the Emergency Department and after careful evaluation, we did not find any emergent condition requiring admission or further testing in the hospital.  Your exam/testing today is overall reassuring.  Please return to the Emergency Department if you experience any worsening of your condition.   Thank you for allowing us  to be a part of your care.      Ozell HERO. Theadore, MD [facility] Emergency Medicine [facility] [email or web address]    Theadore Ozell HERO, MD 07/11/24 [phone]

## 2024-07-11 NOTE — Discharge Instructions (Signed)
You were evaluated in the Emergency Department and after careful evaluation, we did not find any emergent condition requiring admission or further testing in the hospital.  Your exam/testing today is overall reassuring.  Please return to the Emergency Department if you experience any worsening of your condition.   Thank you for allowing us to be a part of your care. 

## 2024-07-20 ENCOUNTER — Encounter: Payer: MEDICAID | Admitting: Orthopedic Surgery

## 2024-09-10 ENCOUNTER — Emergency Department (HOSPITAL_COMMUNITY): Payer: MEDICAID

## 2024-09-10 ENCOUNTER — Other Ambulatory Visit: Payer: Self-pay

## 2024-09-10 ENCOUNTER — Encounter (HOSPITAL_COMMUNITY): Payer: Self-pay

## 2024-09-10 ENCOUNTER — Emergency Department (HOSPITAL_COMMUNITY)
Admission: EM | Admit: 2024-09-10 | Discharge: 2024-09-10 | Disposition: A | Discharge status: Home / Self Care | Payer: MEDICAID | Attending: Emergency Medicine | Admitting: Emergency Medicine

## 2024-09-10 DIAGNOSIS — S3210XA Unspecified fracture of sacrum, initial encounter for closed fracture: Secondary | ICD-10-CM | POA: Insufficient documentation

## 2024-09-10 DIAGNOSIS — M542 Cervicalgia: Secondary | ICD-10-CM | POA: Insufficient documentation

## 2024-09-10 DIAGNOSIS — R102 Pelvic and perineal pain unspecified side: Secondary | ICD-10-CM | POA: Insufficient documentation

## 2024-09-10 MED ORDER — METHOCARBAMOL 500 MG PO TABS
750.0000 mg | ORAL_TABLET | Freq: Once | ORAL | Status: AC
  Administered 2024-09-10: 750 mg via ORAL
  Filled 2024-09-10: qty 2

## 2024-09-10 MED ORDER — OXYCODONE-ACETAMINOPHEN 5-325 MG PO TABS: 1.0000 | ORAL_TABLET | Freq: Four times a day (QID) | ORAL | 0 refills | Status: AC | PRN

## 2024-09-10 MED ORDER — OXYCODONE-ACETAMINOPHEN 5-325 MG PO TABS
1.0000 | ORAL_TABLET | Freq: Once | ORAL | Status: AC
  Administered 2024-09-10: 1 via ORAL
  Filled 2024-09-10: qty 1

## 2024-09-10 MED ORDER — NAPROXEN 500 MG PO TABS: 500.0000 mg | ORAL_TABLET | Freq: Two times a day (BID) | ORAL | 0 refills | Status: AC

## 2024-09-10 MED ORDER — LIDOCAINE 5 % EX PTCH
1.0000 | MEDICATED_PATCH | CUTANEOUS | Status: DC
  Administered 2024-09-10: 1 via TRANSDERMAL
  Filled 2024-09-10: qty 1

## 2024-09-10 MED ORDER — KETOROLAC TROMETHAMINE 15 MG/ML IJ SOLN
15.0000 mg | Freq: Once | INTRAMUSCULAR | Status: AC
  Administered 2024-09-10: 15 mg via INTRAMUSCULAR
  Filled 2024-09-10: qty 1
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
## 2024-09-10 NOTE — Discharge Instructions (Signed)
 Please follow-up closely with your primary care doctor on an outpatient basis.  Resources for follow-up with neurosurgery were provided as well as these are the spine specialist.  Return to the emergency department immediately for any new or worsening symptoms.
